# Patient Record
Sex: Female | Born: 1970 | Race: Black or African American | Hispanic: No | Smoking: Never smoker
Health system: Southern US, Community
[De-identification: ages and names within clinical notes are randomized; demographics above are authoritative.]

## PROBLEM LIST (undated history)

## (undated) DIAGNOSIS — K219 Gastro-esophageal reflux disease without esophagitis: Secondary | ICD-10-CM

## (undated) DIAGNOSIS — I10 Essential (primary) hypertension: Secondary | ICD-10-CM

## (undated) DIAGNOSIS — F319 Bipolar disorder, unspecified: Secondary | ICD-10-CM

## (undated) HISTORY — DX: Essential (primary) hypertension: I10

---

## 2001-10-21 ENCOUNTER — Emergency Department (HOSPITAL_COMMUNITY): Admission: EM | Admit: 2001-10-21 | Discharge: 2001-10-21 | Payer: Self-pay | Admitting: Emergency Medicine

## 2001-10-21 ENCOUNTER — Encounter: Payer: Self-pay | Admitting: Emergency Medicine

## 2002-10-07 ENCOUNTER — Emergency Department (HOSPITAL_COMMUNITY): Admission: EM | Admit: 2002-10-07 | Discharge: 2002-10-07 | Payer: Self-pay | Admitting: Emergency Medicine

## 2002-12-04 ENCOUNTER — Emergency Department (HOSPITAL_COMMUNITY): Admission: EM | Admit: 2002-12-04 | Discharge: 2002-12-04 | Payer: Self-pay | Admitting: Emergency Medicine

## 2003-03-24 ENCOUNTER — Encounter (INDEPENDENT_AMBULATORY_CARE_PROVIDER_SITE_OTHER): Payer: Self-pay | Admitting: Nurse Practitioner

## 2003-06-01 ENCOUNTER — Ambulatory Visit (HOSPITAL_COMMUNITY): Admission: RE | Admit: 2003-06-01 | Discharge: 2003-06-01 | Payer: Self-pay | Admitting: Internal Medicine

## 2004-12-24 ENCOUNTER — Emergency Department (HOSPITAL_COMMUNITY): Admission: EM | Admit: 2004-12-24 | Discharge: 2004-12-24 | Payer: Self-pay | Admitting: Emergency Medicine

## 2004-12-31 ENCOUNTER — Emergency Department (HOSPITAL_COMMUNITY): Admission: EM | Admit: 2004-12-31 | Discharge: 2004-12-31 | Payer: Self-pay | Admitting: Emergency Medicine

## 2005-01-08 ENCOUNTER — Emergency Department (HOSPITAL_COMMUNITY): Admission: EM | Admit: 2005-01-08 | Discharge: 2005-01-08 | Payer: Self-pay | Admitting: Emergency Medicine

## 2005-01-09 ENCOUNTER — Emergency Department (HOSPITAL_COMMUNITY): Admission: EM | Admit: 2005-01-09 | Discharge: 2005-01-09 | Payer: Self-pay | Admitting: Emergency Medicine

## 2005-02-03 ENCOUNTER — Emergency Department (HOSPITAL_COMMUNITY): Admission: EM | Admit: 2005-02-03 | Discharge: 2005-02-03 | Payer: Self-pay | Admitting: Emergency Medicine

## 2005-03-26 ENCOUNTER — Emergency Department (HOSPITAL_COMMUNITY): Admission: EM | Admit: 2005-03-26 | Discharge: 2005-03-26 | Payer: Self-pay | Admitting: Emergency Medicine

## 2005-08-20 ENCOUNTER — Emergency Department (HOSPITAL_COMMUNITY): Admission: EM | Admit: 2005-08-20 | Discharge: 2005-08-20 | Payer: Self-pay | Admitting: Emergency Medicine

## 2005-09-01 ENCOUNTER — Emergency Department (HOSPITAL_COMMUNITY): Admission: EM | Admit: 2005-09-01 | Discharge: 2005-09-01 | Payer: Self-pay | Admitting: Emergency Medicine

## 2005-10-24 ENCOUNTER — Emergency Department (HOSPITAL_COMMUNITY): Admission: EM | Admit: 2005-10-24 | Discharge: 2005-10-25 | Payer: Self-pay | Admitting: *Deleted

## 2006-03-05 ENCOUNTER — Emergency Department (HOSPITAL_COMMUNITY): Admission: EM | Admit: 2006-03-05 | Discharge: 2006-03-05 | Payer: Self-pay | Admitting: Emergency Medicine

## 2006-05-24 ENCOUNTER — Emergency Department (HOSPITAL_COMMUNITY): Admission: EM | Admit: 2006-05-24 | Discharge: 2006-05-25 | Payer: Self-pay | Admitting: Emergency Medicine

## 2006-10-12 ENCOUNTER — Emergency Department (HOSPITAL_COMMUNITY): Admission: EM | Admit: 2006-10-12 | Discharge: 2006-10-13 | Payer: Self-pay | Admitting: Emergency Medicine

## 2006-10-17 ENCOUNTER — Ambulatory Visit: Payer: Self-pay | Admitting: Nurse Practitioner

## 2006-10-17 ENCOUNTER — Ambulatory Visit: Payer: Self-pay | Admitting: *Deleted

## 2006-11-20 ENCOUNTER — Ambulatory Visit: Payer: Self-pay | Admitting: Internal Medicine

## 2007-02-12 ENCOUNTER — Ambulatory Visit: Payer: Self-pay | Admitting: Internal Medicine

## 2007-03-08 ENCOUNTER — Encounter (INDEPENDENT_AMBULATORY_CARE_PROVIDER_SITE_OTHER): Payer: Self-pay | Admitting: Nurse Practitioner

## 2007-03-08 DIAGNOSIS — K219 Gastro-esophageal reflux disease without esophagitis: Secondary | ICD-10-CM | POA: Insufficient documentation

## 2007-03-08 DIAGNOSIS — E669 Obesity, unspecified: Secondary | ICD-10-CM | POA: Insufficient documentation

## 2007-03-08 DIAGNOSIS — M26609 Unspecified temporomandibular joint disorder, unspecified side: Secondary | ICD-10-CM | POA: Insufficient documentation

## 2007-03-09 ENCOUNTER — Ambulatory Visit: Payer: Self-pay | Admitting: Family Medicine

## 2007-04-23 ENCOUNTER — Ambulatory Visit: Payer: Self-pay | Admitting: Internal Medicine

## 2007-08-30 ENCOUNTER — Ambulatory Visit: Payer: Self-pay | Admitting: Internal Medicine

## 2007-09-18 ENCOUNTER — Ambulatory Visit: Payer: Self-pay | Admitting: Internal Medicine

## 2008-05-19 ENCOUNTER — Ambulatory Visit: Payer: Self-pay | Admitting: Internal Medicine

## 2008-05-21 ENCOUNTER — Ambulatory Visit (HOSPITAL_COMMUNITY): Admission: RE | Admit: 2008-05-21 | Discharge: 2008-05-21 | Payer: Self-pay | Admitting: Internal Medicine

## 2008-08-20 ENCOUNTER — Encounter (INDEPENDENT_AMBULATORY_CARE_PROVIDER_SITE_OTHER): Payer: Self-pay | Admitting: Internal Medicine

## 2008-08-20 ENCOUNTER — Ambulatory Visit: Payer: Self-pay | Admitting: Internal Medicine

## 2008-08-20 LAB — CONVERTED CEMR LAB
ALT: 14 units/L (ref 0–35)
AST: 14 units/L (ref 0–37)
Basophils Relative: 1 % (ref 0–1)
Chloride: 105 meq/L (ref 96–112)
Creatinine, Ser: 0.86 mg/dL (ref 0.40–1.20)
Glucose, Bld: 96 mg/dL (ref 70–99)
HCT: 39.4 % (ref 36.0–46.0)
Lymphocytes Relative: 40 % (ref 12–46)
Lymphs Abs: 2.3 10*3/uL (ref 0.7–4.0)
MCHC: 32.2 g/dL (ref 30.0–36.0)
Monocytes Absolute: 0.3 10*3/uL (ref 0.1–1.0)
Neutrophils Relative %: 50 % (ref 43–77)
Potassium: 4.2 meq/L (ref 3.5–5.3)
RDW: 14.3 % (ref 11.5–15.5)
Total Bilirubin: 0.4 mg/dL (ref 0.3–1.2)
Total Protein: 6.9 g/dL (ref 6.0–8.3)

## 2008-09-12 ENCOUNTER — Emergency Department (HOSPITAL_COMMUNITY): Admission: EM | Admit: 2008-09-12 | Discharge: 2008-09-13 | Payer: Self-pay | Admitting: Emergency Medicine

## 2008-09-18 ENCOUNTER — Ambulatory Visit: Payer: Self-pay | Admitting: Internal Medicine

## 2008-12-05 ENCOUNTER — Emergency Department (HOSPITAL_COMMUNITY): Admission: EM | Admit: 2008-12-05 | Discharge: 2008-12-05 | Payer: Self-pay | Admitting: Emergency Medicine

## 2008-12-06 ENCOUNTER — Emergency Department (HOSPITAL_COMMUNITY): Admission: EM | Admit: 2008-12-06 | Discharge: 2008-12-06 | Payer: Self-pay | Admitting: Emergency Medicine

## 2008-12-08 ENCOUNTER — Ambulatory Visit: Payer: Self-pay | Admitting: Family Medicine

## 2008-12-10 ENCOUNTER — Encounter (INDEPENDENT_AMBULATORY_CARE_PROVIDER_SITE_OTHER): Payer: Self-pay | Admitting: Internal Medicine

## 2008-12-10 LAB — CONVERTED CEMR LAB: LDH: 146 units/L (ref 94–250)

## 2008-12-11 ENCOUNTER — Encounter (INDEPENDENT_AMBULATORY_CARE_PROVIDER_SITE_OTHER): Payer: Self-pay | Admitting: Internal Medicine

## 2008-12-11 ENCOUNTER — Ambulatory Visit: Payer: Self-pay | Admitting: Internal Medicine

## 2008-12-11 LAB — CONVERTED CEMR LAB: Mumps IgG: 1.7 — ABNORMAL HIGH

## 2008-12-15 ENCOUNTER — Ambulatory Visit (HOSPITAL_COMMUNITY): Admission: RE | Admit: 2008-12-15 | Discharge: 2008-12-15 | Payer: Self-pay | Admitting: Internal Medicine

## 2008-12-24 ENCOUNTER — Ambulatory Visit: Payer: Self-pay | Admitting: Internal Medicine

## 2009-01-30 ENCOUNTER — Emergency Department (HOSPITAL_COMMUNITY): Admission: EM | Admit: 2009-01-30 | Discharge: 2009-01-30 | Payer: Self-pay | Admitting: Emergency Medicine

## 2009-04-07 ENCOUNTER — Ambulatory Visit: Payer: Self-pay | Admitting: Internal Medicine

## 2009-04-07 ENCOUNTER — Encounter (INDEPENDENT_AMBULATORY_CARE_PROVIDER_SITE_OTHER): Payer: Self-pay | Admitting: Internal Medicine

## 2009-04-07 LAB — CONVERTED CEMR LAB
Basophils Relative: 1 % (ref 0–1)
Hemoglobin: 13.4 g/dL (ref 12.0–15.0)
Lymphocytes Relative: 45 % (ref 12–46)
Lymphs Abs: 2.5 10*3/uL (ref 0.7–4.0)
Monocytes Relative: 5 % (ref 3–12)
Neutrophils Relative %: 49 % (ref 43–77)
Platelets: 298 10*3/uL (ref 150–400)
RDW: 14.2 % (ref 11.5–15.5)
WBC: 5.6 10*3/uL (ref 4.0–10.5)

## 2009-04-30 ENCOUNTER — Ambulatory Visit: Payer: Self-pay | Admitting: Family Medicine

## 2009-05-26 ENCOUNTER — Ambulatory Visit: Payer: Self-pay | Admitting: Internal Medicine

## 2009-06-02 ENCOUNTER — Encounter (INDEPENDENT_AMBULATORY_CARE_PROVIDER_SITE_OTHER): Payer: Self-pay | Admitting: Adult Health

## 2009-06-02 ENCOUNTER — Ambulatory Visit: Payer: Self-pay | Admitting: Internal Medicine

## 2009-06-02 LAB — CONVERTED CEMR LAB
Alkaline Phosphatase: 69 units/L (ref 39–117)
CO2: 26 meq/L (ref 19–32)
Chloride: 106 meq/L (ref 96–112)
Glucose, Bld: 82 mg/dL (ref 70–99)
HDL: 38 mg/dL — ABNORMAL LOW (ref >39)
Hemoglobin: 12 g/dL (ref 12.0–15.0)
Lymphs Abs: 2.1 10*3/uL (ref 0.7–4.0)
Neutrophils Relative %: 43 % (ref 43–77)
Potassium: 4.2 meq/L (ref 3.5–5.3)
RBC: 4.02 M/uL (ref 3.87–5.11)
RDW: 13.7 % (ref 11.5–15.5)
Sodium: 140 meq/L (ref 135–145)
Total CHOL/HDL Ratio: 4.6
Total Protein: 7 g/dL (ref 6.0–8.3)
WBC: 4.3 10*3/uL (ref 4.0–10.5)

## 2009-07-02 ENCOUNTER — Ambulatory Visit: Payer: Self-pay | Admitting: Internal Medicine

## 2009-08-27 ENCOUNTER — Emergency Department (HOSPITAL_COMMUNITY): Admission: EM | Admit: 2009-08-27 | Discharge: 2009-08-28 | Payer: Self-pay | Admitting: Emergency Medicine

## 2010-03-05 ENCOUNTER — Emergency Department (HOSPITAL_COMMUNITY): Admission: EM | Admit: 2010-03-05 | Discharge: 2010-03-05 | Payer: Self-pay | Admitting: Emergency Medicine

## 2010-06-03 ENCOUNTER — Emergency Department (HOSPITAL_COMMUNITY)
Admission: EM | Admit: 2010-06-03 | Discharge: 2010-06-03 | Payer: Self-pay | Source: Home / Self Care | Admitting: Emergency Medicine

## 2010-06-09 ENCOUNTER — Emergency Department (HOSPITAL_COMMUNITY)
Admission: EM | Admit: 2010-06-09 | Discharge: 2010-06-09 | Disposition: A | Discharge status: Home / Self Care | Payer: Self-pay | Attending: Emergency Medicine | Admitting: Emergency Medicine

## 2010-06-09 DIAGNOSIS — K3189 Other diseases of stomach and duodenum: Secondary | ICD-10-CM | POA: Insufficient documentation

## 2010-06-09 DIAGNOSIS — R51 Headache: Secondary | ICD-10-CM | POA: Insufficient documentation

## 2010-06-09 DIAGNOSIS — R110 Nausea: Secondary | ICD-10-CM | POA: Insufficient documentation

## 2010-06-09 DIAGNOSIS — R197 Diarrhea, unspecified: Secondary | ICD-10-CM | POA: Insufficient documentation

## 2010-06-09 DIAGNOSIS — K219 Gastro-esophageal reflux disease without esophagitis: Secondary | ICD-10-CM | POA: Insufficient documentation

## 2010-06-09 DIAGNOSIS — R109 Unspecified abdominal pain: Secondary | ICD-10-CM | POA: Insufficient documentation

## 2010-06-09 DIAGNOSIS — R10819 Abdominal tenderness, unspecified site: Secondary | ICD-10-CM | POA: Insufficient documentation

## 2010-06-09 DIAGNOSIS — R42 Dizziness and giddiness: Secondary | ICD-10-CM | POA: Insufficient documentation

## 2010-06-09 DIAGNOSIS — R509 Fever, unspecified: Secondary | ICD-10-CM | POA: Insufficient documentation

## 2010-06-09 LAB — POCT I-STAT, CHEM 8
BUN: 9 mg/dL (ref 6–23)
Calcium, Ion: 1.24 mmol/L (ref 1.12–1.32)
Chloride: 105 mEq/L (ref 96–112)
Creatinine, Ser: 0.9 mg/dL (ref 0.4–1.2)
HCT: 40 % (ref 36.0–46.0)
Hemoglobin: 13.6 g/dL (ref 12.0–15.0)
Potassium: 3.7 mEq/L (ref 3.5–5.1)
TCO2: 27 mmol/L (ref 0–100)

## 2010-06-09 LAB — URINALYSIS, ROUTINE W REFLEX MICROSCOPIC
Leukocytes, UA: NEGATIVE
Nitrite: NEGATIVE
Specific Gravity, Urine: 1.027 (ref 1.005–1.030)

## 2010-06-09 LAB — URINE MICROSCOPIC-ADD ON

## 2010-06-09 LAB — BASIC METABOLIC PANEL
BUN: 7 mg/dL (ref 6–23)
Calcium: 9.3 mg/dL (ref 8.4–10.5)
Chloride: 106 mEq/L (ref 96–112)
Glucose, Bld: 116 mg/dL — ABNORMAL HIGH (ref 70–99)
Potassium: 3.7 mEq/L (ref 3.5–5.1)

## 2010-06-09 LAB — POCT PREGNANCY, URINE: Preg Test, Ur: NEGATIVE

## 2010-06-21 ENCOUNTER — Emergency Department (HOSPITAL_COMMUNITY)
Admission: EM | Admit: 2010-06-21 | Discharge: 2010-06-21 | Disposition: A | Discharge status: Home / Self Care | Payer: Self-pay | Attending: Emergency Medicine | Admitting: Emergency Medicine

## 2010-06-21 DIAGNOSIS — J069 Acute upper respiratory infection, unspecified: Secondary | ICD-10-CM | POA: Insufficient documentation

## 2010-06-21 DIAGNOSIS — J3489 Other specified disorders of nose and nasal sinuses: Secondary | ICD-10-CM | POA: Insufficient documentation

## 2010-06-21 DIAGNOSIS — K219 Gastro-esophageal reflux disease without esophagitis: Secondary | ICD-10-CM | POA: Insufficient documentation

## 2010-07-21 LAB — DIFFERENTIAL
Basophils Relative: 0 % (ref 0–1)
Eosinophils Absolute: 0.1 10*3/uL (ref 0.0–0.7)
Monocytes Relative: 5 % (ref 3–12)
Neutro Abs: 2.5 10*3/uL (ref 1.7–7.7)
Neutrophils Relative %: 51 % (ref 43–77)

## 2010-07-21 LAB — COMPREHENSIVE METABOLIC PANEL
ALT: 23 U/L (ref 0–35)
Alkaline Phosphatase: 72 U/L (ref 39–117)
BUN: 14 mg/dL (ref 6–23)
Chloride: 106 mEq/L (ref 96–112)
Creatinine, Ser: 0.74 mg/dL (ref 0.4–1.2)
GFR calc Af Amer: >60 mL/min (ref >60)
GFR calc non Af Amer: >60 mL/min (ref >60)
Potassium: 4.1 mEq/L (ref 3.5–5.1)
Total Bilirubin: 0.3 mg/dL (ref 0.3–1.2)
Total Protein: 7.2 g/dL (ref 6.0–8.3)

## 2010-07-21 LAB — STOOL CULTURE

## 2010-07-21 LAB — CBC
Hemoglobin: 11.8 g/dL — ABNORMAL LOW (ref 12.0–15.0)
MCH: 30.8 pg (ref 26.0–34.0)
RBC: 3.83 MIL/uL — ABNORMAL LOW (ref 3.87–5.11)

## 2010-07-21 LAB — URINALYSIS, ROUTINE W REFLEX MICROSCOPIC
Ketones, ur: NEGATIVE mg/dL
Protein, ur: NEGATIVE mg/dL
Urobilinogen, UA: 0.2 mg/dL (ref 0.0–1.0)

## 2010-07-21 LAB — GIARDIA/CRYPTOSPORIDIUM SCREEN(EIA): Cryptosporidium Screen (EIA): NEGATIVE

## 2010-07-21 LAB — CLOSTRIDIUM DIFFICILE EIA: C difficile Toxins A+B, EIA: NEGATIVE

## 2010-07-27 LAB — RAPID STREP SCREEN (MED CTR MEBANE ONLY): Streptococcus, Group A Screen (Direct): NEGATIVE

## 2010-07-27 LAB — STREP A DNA PROBE: Group A Strep Probe: NEGATIVE

## 2011-08-25 ENCOUNTER — Observation Stay (HOSPITAL_COMMUNITY)
Admission: EM | Admit: 2011-08-25 | Discharge: 2011-08-26 | Disposition: A | Discharge status: Home / Self Care | Payer: Self-pay | Attending: Emergency Medicine | Admitting: Emergency Medicine

## 2011-08-25 ENCOUNTER — Encounter (HOSPITAL_COMMUNITY): Payer: Self-pay | Admitting: *Deleted

## 2011-08-25 DIAGNOSIS — R609 Edema, unspecified: Secondary | ICD-10-CM

## 2011-08-25 DIAGNOSIS — E669 Obesity, unspecified: Secondary | ICD-10-CM

## 2011-08-25 DIAGNOSIS — M7989 Other specified soft tissue disorders: Principal | ICD-10-CM | POA: Insufficient documentation

## 2011-08-25 NOTE — ED Notes (Signed)
Patient with bilateral lower leg swelling and her right foot hurts.

## 2011-08-26 ENCOUNTER — Observation Stay (HOSPITAL_COMMUNITY): Payer: Self-pay

## 2011-08-26 ENCOUNTER — Other Ambulatory Visit (HOSPITAL_COMMUNITY): Payer: Self-pay

## 2011-08-26 DIAGNOSIS — M7989 Other specified soft tissue disorders: Secondary | ICD-10-CM

## 2011-08-26 LAB — POCT I-STAT, CHEM 8
Calcium, Ion: 1.23 mmol/L (ref 1.12–1.32)
Chloride: 106 mEq/L (ref 96–112)
Glucose, Bld: 101 mg/dL — ABNORMAL HIGH (ref 70–99)
HCT: 34 % — ABNORMAL LOW (ref 36.0–46.0)
Hemoglobin: 11.6 g/dL — ABNORMAL LOW (ref 12.0–15.0)

## 2011-08-26 LAB — URINALYSIS, ROUTINE W REFLEX MICROSCOPIC
Ketones, ur: NEGATIVE mg/dL
Leukocytes, UA: NEGATIVE
Nitrite: NEGATIVE
Protein, ur: NEGATIVE mg/dL

## 2011-08-26 LAB — CBC
MCH: 30 pg (ref 26.0–34.0)
MCV: 91.6 fL (ref 78.0–100.0)
Platelets: 232 10*3/uL (ref 150–400)
RDW: 14.7 % (ref 11.5–15.5)
WBC: 6.8 10*3/uL (ref 4.0–10.5)

## 2011-08-26 LAB — DIFFERENTIAL
Basophils Absolute: 0 10*3/uL (ref 0.0–0.1)
Eosinophils Absolute: 0.2 10*3/uL (ref 0.0–0.7)
Eosinophils Relative: 2 % (ref 0–5)

## 2011-08-26 MED ORDER — ENOXAPARIN SODIUM 150 MG/ML ~~LOC~~ SOLN
1.0000 mg/kg | Freq: Once | SUBCUTANEOUS | Status: AC
  Administered 2011-08-26: 130 mg via SUBCUTANEOUS
  Filled 2011-08-26: qty 1

## 2011-08-26 NOTE — ED Notes (Signed)
The pt was given lovenox 130mg  sq in the rt abd 2 " below the umbilicus.  Food given.  The pt remains alert pleasant.  Unable to move to the cdu because that area is booked.  Pt aware she is ok to stay here in the regular ed.

## 2011-08-26 NOTE — ED Notes (Signed)
The pt just started a new job and she stands all the time.  Both her legs have been swollen especially the rt lower leg.  There is redness and point tenderness in the rt lower leg and both legs are swollen tight

## 2011-08-26 NOTE — ED Notes (Signed)
Vascular tech in with pt at this time 

## 2011-08-26 NOTE — ED Provider Notes (Signed)
Sign out by Dr. Nicanor Alcon. Patient was awaiting bilateral LE venous duplex US which were negative for bilateral LE edema. No signs of cellulitis. Bilateral LE are neuro vasc intact. Patient to elevate and use compression stockings and follow up with PCP for ongoing evaluation and management but gave strict precautions for return to ER. Patient voices understanding and is agreeable to plan.   Christie Walker [place], [place] 08/26/11 [phone]

## 2011-08-26 NOTE — ED Provider Notes (Signed)
History     CSN: [id number]  Arrival date & time 08/25/11  2323   First MD Initiated Contact with Patient 08/26/11 [phone]      Chief Complaint  Patient presents with  . Leg Swelling    (Consider location/radiation/quality/duration/timing/severity/associated sxs/prior treatment) Patient is a 41 y.o. female presenting with leg pain. The history is provided by the patient. No language interpreter was used.  Leg Pain  The incident occurred more than 2 days ago. Incident location: swelling started with new job several weeks ago, pain in the RLE worse this week. There was no injury mechanism. The pain is present in the right leg. The quality of the pain is described as sharp. The pain is at a severity of 9/10. The pain is severe. The pain has been constant since onset. Pertinent negatives include no numbness, no inability to bear weight, no loss of motion, no muscle weakness, no loss of sensation and no tingling. She reports no foreign bodies present. The symptoms are aggravated by palpation. She has tried nothing for the symptoms. The treatment provided no relief.    History reviewed. No pertinent past medical history.  History reviewed. No pertinent past surgical history.  No family history on file.  History  Substance Use Topics  . Smoking status: Never Smoker   . Smokeless tobacco: Not on file  . Alcohol Use: No    OB History    Grav Para Term Preterm Abortions TAB SAB Ect Mult Living                  Review of Systems  Respiratory: Negative for cough and shortness of breath.   Cardiovascular: Positive for leg swelling. Negative for chest pain.  Neurological: Negative for tingling and numbness.  All other systems reviewed and are negative.    Allergies  Propoxacet-n  Home Medications   Current Outpatient Rx  Name Route Sig Dispense Refill  . CETIRIZINE HCL 10 MG PO TABS Oral Take 10 mg by mouth daily as needed.    Marland Kitchen FLUTICASONE PROPIONATE 50 MCG/ACT NA SUSP Nasal  Place 2 sprays into the nose daily.      BP 113/63  Pulse 72  Temp(Src) 98.9 F (37.2 C) (Oral)  Resp 18  Ht 5\' 5"  (1.651 m)  Wt 280 lb (127.007 kg)  BMI 46.59 kg/m2  SpO2 99%  Physical Exam  Constitutional: She is oriented to person, place, and time. She appears well-developed and well-nourished. No distress.  HENT:  Head: Normocephalic and atraumatic.  Mouth/Throat: Oropharynx is clear and moist.  Eyes: Conjunctivae are normal. Pupils are equal, round, and reactive to light.  Neck: Normal range of motion. Neck supple.  Cardiovascular: Normal rate and regular rhythm.   Pulmonary/Chest: Effort normal and breath sounds normal.  Abdominal: Soft. Bowel sounds are normal.  Musculoskeletal: Normal range of motion. She exhibits edema.       BLE edema pain over right mid tibia at site of previous scar  Neurological: She is alert and oriented to person, place, and time.  Skin: Skin is warm and dry.  Psychiatric: She has a normal mood and affect.    ED Course  Procedures (including critical care time)  Labs Reviewed  CBC - Abnormal; Notable for the following:    RBC 3.57 (*)    Hemoglobin 10.7 (*)    HCT 32.7 (*)    All other components within normal limits  URINALYSIS, ROUTINE W REFLEX MICROSCOPIC - Abnormal; Notable for the following:  APPearance CLOUDY (*)    All other components within normal limits  D-DIMER, QUANTITATIVE - Abnormal; Notable for the following:    D-Dimer, Quant 0.55 (*)    All other components within normal limits  POCT I-STAT, CHEM 8 - Abnormal; Notable for the following:    Glucose, Bld 101 (*)    Hemoglobin 11.6 (*)    HCT 34.0 (*)    All other components within normal limits  DIFFERENTIAL  POCT PREGNANCY, URINE  PROTIME-INR   No results found.   No diagnosis found.    MDM  DDimer slightly elevated will r/o for DVT if negative will need to follow up with PMD for peripheral edema        Lyncoln Ledgerwood K Madyn Ivins-Rasch, MD 08/26/11 1610

## 2011-08-26 NOTE — ED Notes (Signed)
Pt resting, waiting on vascular tech

## 2011-08-26 NOTE — Progress Notes (Signed)
*  PRELIMINARY RESULTS* Vascular Ultrasound Lower extremity venous duplex has been completed.  Preliminary findings: Bilaterally no evidence of DVT or baker's cyst.  Farrel Demark, RDMS 08/26/2011, 8:25 AM

## 2011-08-26 NOTE — ED Notes (Signed)
Visual acquity ordered by mistake.  Not for this pt

## 2011-08-26 NOTE — Discharge Instructions (Signed)
Establishment with a Primary Care provider is Very important for general health care concerns, minor illness and minor injury and for further evaluation and management of lower extremity edema. Alternate between tylenol and ibuprofen as needed for pain but elevation and compression stockings are primary treatment. Return to ER for emergent changing or worsening of symptoms.

## 2011-08-28 NOTE — ED Provider Notes (Signed)
Medical screening examination/treatment/procedure(s) were performed by non-physician practitioner and as supervising physician I was immediately available for consultation/collaboration.  Jasmine Awe, MD 08/28/11 2300

## 2011-09-06 ENCOUNTER — Emergency Department (INDEPENDENT_AMBULATORY_CARE_PROVIDER_SITE_OTHER)
Admission: EM | Admit: 2011-09-06 | Discharge: 2011-09-06 | Disposition: A | Discharge status: Home / Self Care | Payer: Managed Care, Other (non HMO) | Source: Home / Self Care | Attending: Family Medicine | Admitting: Family Medicine

## 2011-09-06 ENCOUNTER — Encounter (HOSPITAL_COMMUNITY): Payer: Self-pay | Admitting: *Deleted

## 2011-09-06 DIAGNOSIS — I8310 Varicose veins of unspecified lower extremity with inflammation: Secondary | ICD-10-CM

## 2011-09-06 DIAGNOSIS — I872 Venous insufficiency (chronic) (peripheral): Secondary | ICD-10-CM

## 2011-09-06 MED ORDER — TRIAMCINOLONE 0.1 % CREAM:EUCERIN CREAM 1:1: TOPICAL_CREAM | CUTANEOUS | Status: DC

## 2011-09-06 MED ORDER — IBUPROFEN 600 MG PO TABS: 600.0000 mg | ORAL_TABLET | Freq: Three times a day (TID) | ORAL | Status: AC | PRN

## 2011-09-06 MED ORDER — CAPSAICIN & MENTHOL 0.025 & 4 % EX KIT: 1.0000 "application " | PACK | Freq: Three times a day (TID) | CUTANEOUS | Status: DC | PRN

## 2011-09-06 NOTE — ED Notes (Signed)
Pt  Seen  Er  Several weeks  Ago  For  Leg  Edema    -  She  Had  Ultrasound  Of her  Leg  Which  Was  Negative    She  Reports  r leg  Now is  painfull and  Somewhat  Reg  Tinged   She  Does however  Report the  Swelling is  Somewhat better  She  Ambulated  To  Room    Is  Sitting  Upright on  Exam table  Speaking in  Complete  sentances     denys  Any  Chest pain or any  Sob

## 2011-09-06 NOTE — Discharge Instructions (Signed)
Although the swelling in your legs Christie Walker risk for infections in your skin. I do not see signs of infection today. Decreasing the swelling in your legs is what can make her symptoms improve. 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 You can also try leg wraps intermittently. Do not sleep with your legs wrapped. Leg elevation by elevating there lower end of your bed could help.  Applied/take the prescribed medications as instructed. Avoid scratching as it can increase your risk for skin breaks and infections.  You need to lose weight and exercise daily. Avoid prolong standing or sitting with outbreaks to exercise her legs. You need to establish care with a primary care provider who can monitor your symptoms and safely tried other forms of therapy to help you. Return to urgent or emergency department services if worsening redness, ulcers, swelling or drainage.   Compression Stockings Compression stockings are elastic stockings that "compress" your legs. This helps to increase blood flow, decrease swelling, and reduces the chance of getting blood clots in your lower legs. Compression stockings are used:  After surgery.   If you have a history of poor circulation.   If you are prone to blood clots.   If you have varicose veins.   If you sit or are bedridden for long periods of time.  WEARING COMPRESSION STOCKINGS  Your compression stockings should be worn as instructed by your caregiver.   Wearing the correct stocking size is important. Your caregiver can help measure and fit you to the correct size.   When wearing your stockings, do not allow the stockings to bunch up. This is especially important around your toes or behind your knees. Keep the stockings as smooth as possible.   Do not roll the stockings downward and leave them rolled down. This can form a restrictive band around your  legs and can decrease blood flow.   The stockings should be removed once a day for 1 hour or as instructed by your caregiver. When the stockings are taken off, inspect your legs and feet. Look for:   Open sores.   Red spots.   Puffy areas (swelling).   Anything that does not seem normal.  IMPORTANT INFORMATION ABOUT COMPRESSION STOCKINGS  The compression stockings should be clean, dry, and in good condition before you put them on.   Do not put lotion on your legs or feet. This makes it harder to put the stockings on.   Change your stockings immediately if they become wet or soiled.   Do not wear stockings that are ripped or torn.   You may hand-wash or put your stockings in the washing machine. Use cold or warm water with mild detergent. Do not bleach your stockings. They may be air-dried or dried in the dryer on low heat.   If you have pain or have a feeling of "pins and needles" in your feet or legs, you may be wearing stockings that are too tight. Call your caregiver right away.  SEEK IMMEDIATE MEDICAL CARE IF:   You have numbness or tingling in your lower legs that does not get better quickly after the stockings are removed.   Your toes or feet become cold and blue.   You develop open sores or have red spots on your legs that do not go away.  MAKE SURE YOU:   Understand these instructions.   Will watch your condition.  Will get help right away if you are not doing well or get worse.  Document Released: 02/20/2009 Document Revised: 04/14/2011 Document Reviewed: 02/20/2009 [facility] Patient Information 2012 [place], [place].

## 2011-09-07 NOTE — ED Notes (Signed)
Pharmacy called, asking for clarification of Rx cream

## 2011-09-08 NOTE — ED Provider Notes (Signed)
History     CSN: [id number]  Arrival date & time 09/06/11  1512   First MD Initiated Contact with Patient 09/06/11 1604      Chief Complaint  Patient presents with  . Leg Swelling    (Consider location/radiation/quality/duration/timing/severity/associated sxs/prior treatment) HPI Comments: 41 y/o morbidly obese non smoker female with h/o chronic lower leg swelling. Here concerned about persistent bilateral leg swelling. She was seen at [facility] ED on 08/25/11 and had negative low extremity dopplers. Reports her swelling has improved some but persistent, associated with burning, tingling sensation; she also noticed an "indentation" in the skin of right lower leg and darkened skin color that has not improved or changed since last she was in the emergency department.  No ulcers or drainage in her lower legs. 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 She has used compression stockings which has helped some with swelling when she wears them. But she "has not used them today so I could see the degree of swelling".  Denies fever, chills or malaise, good appetite, no PND, no shortness of breath or chest pain.  States she was praying and "the holly spirit revealed to her she was bitten by a spider in the right leg and she believes that if she gets treatment for spider bite her symptoms will resolve" .  Patient just recently got health insurance.   History reviewed. No pertinent past medical history.  History reviewed. No pertinent past surgical history.  History reviewed. No pertinent family history.  History  Substance Use Topics  . Smoking status: Never Smoker   . Smokeless tobacco: Not on file  . Alcohol Use: No    OB History    Grav Para Term Preterm Abortions TAB SAB Ect Mult Living                  Review of Systems  Constitutional: Negative for fever, chills, diaphoresis, activity change and fatigue.  Respiratory: Negative for cough, chest tightness  and shortness of breath.   Cardiovascular: Positive for leg swelling. Negative for chest pain and palpitations.  Skin:       As per HPI  Neurological: Negative for dizziness and headaches.    Allergies  Amoxicillin; Hydrocodone; and Propoxyphene-acetaminophen  Home Medications   Current Outpatient Rx  Name Route Sig Dispense Refill  . PANTOPRAZOLE SODIUM 40 MG PO TBEC Oral Take 40 mg by mouth daily.    Marland Kitchen CAPSAICIN & MENTHOL 0.025 & 4 % EX KIT Apply externally Apply 1 application topically 3 (three) times daily as needed. 56.6 g 0  . CETIRIZINE HCL 10 MG PO TABS Oral Take 10 mg by mouth daily as needed.    Marland Kitchen FLUTICASONE PROPIONATE 50 MCG/ACT NA SUSP Nasal Place 2 sprays into the nose daily.    . IBUPROFEN 600 MG PO TABS Oral Take 1 tablet (600 mg total) by mouth every 8 (eight) hours as needed for pain. 20 tablet 0  . TRIAMCINOLONE 0.1 % CREAM:EUCERIN CREAM 1:1  Compound triamcinolone ointment 1% with Eucerin cream 1:1. Apply bid prn for itchiness, burning  or redness. Dispense 1 jar 1 each 0    BP 105/61  Pulse 75  Temp(Src) 98.4 F (36.9 C) (Oral)  Resp 18  SpO2 99%  Physical Exam  Nursing note and vitals reviewed. Constitutional: She is oriented to person, place, and time. She appears well-developed and well-nourished. No distress.       Morbidly obese  Neck: No  JVD present. No thyromegaly present.  Cardiovascular: Normal rate, regular rhythm and normal heart sounds.   Pulmonary/Chest: Effort normal and breath sounds normal. No respiratory distress. She has no wheezes. She has no rales.  Musculoskeletal:       Lower extremities with symmetric impending edema skin pretibial skin stasis dermatitis changes . Otherwise neurovascularly intact.   Neurological: She is alert and oriented to person, place, and time.  Skin:       Bilateral symmetric pitting ankle and pretibial leg edema up to mid lower legs. There are more evident skin changes in right leg with skin discoloration  mild erythema, thickening of the skin, (hyperpigmentation), tenderness and beginning to present skin atrophy in area around old scar from prior bone trauma. No striking erythema, no skin ulcers or brakes, no cellulitis.  No distal cyanosis.     ED Course  Procedures (including critical care time)  Labs Reviewed - No data to display No results found.   1. Stasis dermatitis       MDM  Spent over 20 min counseling patient about her chronic condition and her risk factors including obesity, night job with decreased mobility, lack of primary care doctor who can monitor her symptoms and could safely try other adjuvant therapy like diuretic etc. Prescribed capsaicin, triamcinolone compounded with Eucerin and ibuprofen. Reccommended weight reduction, leg elevation, self wraps (hand out provided) and consistent use of compression stocks, take breaks at work for leg muscle pump exercises. Follow up with a primary care doctor now that she has medical insurance. Asked to return if worsening redness, pain or new symptoms despite following treatment.        Sharin Grave, MD 09/08/11 [phone]

## 2012-04-12 ENCOUNTER — Emergency Department (HOSPITAL_COMMUNITY)
Admission: EM | Admit: 2012-04-12 | Discharge: 2012-04-12 | Disposition: A | Discharge status: Home / Self Care | Payer: Self-pay | Attending: Emergency Medicine | Admitting: Emergency Medicine

## 2012-04-12 ENCOUNTER — Encounter (HOSPITAL_COMMUNITY): Payer: Self-pay | Admitting: *Deleted

## 2012-04-12 DIAGNOSIS — R509 Fever, unspecified: Secondary | ICD-10-CM | POA: Insufficient documentation

## 2012-04-12 DIAGNOSIS — J029 Acute pharyngitis, unspecified: Secondary | ICD-10-CM | POA: Insufficient documentation

## 2012-04-12 DIAGNOSIS — H9209 Otalgia, unspecified ear: Secondary | ICD-10-CM | POA: Insufficient documentation

## 2012-04-12 DIAGNOSIS — J069 Acute upper respiratory infection, unspecified: Secondary | ICD-10-CM | POA: Insufficient documentation

## 2012-04-12 DIAGNOSIS — Z79899 Other long term (current) drug therapy: Secondary | ICD-10-CM | POA: Insufficient documentation

## 2012-04-12 DIAGNOSIS — R51 Headache: Secondary | ICD-10-CM | POA: Insufficient documentation

## 2012-04-12 DIAGNOSIS — R05 Cough: Secondary | ICD-10-CM | POA: Insufficient documentation

## 2012-04-12 DIAGNOSIS — R059 Cough, unspecified: Secondary | ICD-10-CM | POA: Insufficient documentation

## 2012-04-12 DIAGNOSIS — J329 Chronic sinusitis, unspecified: Secondary | ICD-10-CM | POA: Insufficient documentation

## 2012-04-12 MED ORDER — FLUTICASONE PROPIONATE 50 MCG/ACT NA SUSP: 2.0000 | Freq: Every day | NASAL | Status: DC

## 2012-04-12 MED ORDER — SULFAMETHOXAZOLE-TRIMETHOPRIM 800-160 MG PO TABS: 1.0000 | ORAL_TABLET | Freq: Two times a day (BID) | ORAL | Status: DC

## 2012-04-12 NOTE — ED Provider Notes (Signed)
History     CSN: [id number]  Arrival date & time 04/12/12  2141   First MD Initiated Contact with Patient 04/12/12 2148      Chief Complaint  Patient presents with  . Nasal Congestion  . Cough    (Consider location/radiation/quality/duration/timing/severity/associated sxs/prior treatment) HPI Comments: 41 year old female presents to the emergency department complaining of sinus pressure and nasal congestion for the past 3-4 weeks worsening over the past 2-3 days. She's tried taking Claritin, 0 tach and multiple other over-the-counter medications without relief of symptoms. Over the past few days she has developed a fever. She has not taken her temperature, she just states "I can tell a burning up". Admits to occasional cough when she has a lot of postnasal drip in the back of her throat. Denies chest pain, shortness of breath, nausea or vomiting.  The history is provided by the patient.    History reviewed. No pertinent past medical history.  History reviewed. No pertinent past surgical history.  History reviewed. No pertinent family history.  History  Substance Use Topics  . Smoking status: Never Smoker   . Smokeless tobacco: Not on file  . Alcohol Use: No    OB History    Grav Para Term Preterm Abortions TAB SAB Ect Mult Living                  Review of Systems  Constitutional: Positive for fever. Negative for chills.  HENT: Positive for ear pain (fullness), congestion, sore throat, rhinorrhea and sinus pressure. Negative for trouble swallowing, neck pain and neck stiffness.   Eyes: Negative.   Respiratory: Positive for cough. Negative for shortness of breath.   Cardiovascular: Negative for chest pain.  Gastrointestinal: Negative for nausea and vomiting.  Genitourinary: Negative.   Musculoskeletal: Negative.   Skin: Negative for rash.  Neurological: Positive for headaches.  Psychiatric/Behavioral: Negative for confusion.    Allergies  Amoxicillin;  Hydrocodone; and Propoxyphene-acetaminophen  Home Medications   Current Outpatient Rx  Name  Route  Sig  Dispense  Refill  . FLUTICASONE PROPIONATE 50 MCG/ACT NA SUSP   Nasal   Place 2 sprays into the nose daily.   16 g   2   . PANTOPRAZOLE SODIUM 40 MG PO TBEC   Oral   Take 40 mg by mouth daily.         . SULFAMETHOXAZOLE-TRIMETHOPRIM 800-160 MG PO TABS   Oral   Take 1 tablet by mouth 2 (two) times daily. One po bid x 7 days   14 tablet   0     BP 101/69  Pulse 91  Temp 98.6 F (37 C) (Oral)  Resp 16  SpO2 98%  Physical Exam  Nursing note and vitals reviewed. Constitutional: She is oriented to person, place, and time. She appears well-developed. No distress.       Obese  HENT:  Head: Normocephalic and atraumatic.  Right Ear: Hearing, tympanic membrane, external ear and ear canal normal.  Left Ear: Hearing, tympanic membrane, external ear and ear canal normal.  Nose: Mucosal edema and rhinorrhea present. Right sinus exhibits maxillary sinus tenderness. Left sinus exhibits maxillary sinus tenderness.  Mouth/Throat: Mucous membranes are normal. Posterior oropharyngeal edema and posterior oropharyngeal erythema present. No tonsillar abscesses.       Post nasal drip present.  Eyes: Conjunctivae normal are normal.  Neck: Normal range of motion. Neck supple.  Cardiovascular: Normal rate, regular rhythm and normal heart sounds.   Pulmonary/Chest: Effort normal and breath sounds  normal. She has no wheezes. She has no rhonchi.  Musculoskeletal: Normal range of motion. She exhibits no edema.  Lymphadenopathy:    She has cervical adenopathy.       Right cervical: Superficial cervical adenopathy present.       Left cervical: Superficial cervical adenopathy present.  Neurological: She is alert and oriented to person, place, and time.  Skin: Skin is warm.       Slightly clammy  Psychiatric: She has a normal mood and affect. Her behavior is normal.    ED Course   Procedures (including critical care time)  Labs Reviewed - No data to display No results found.   1. Sinusitis   2. URI (upper respiratory infection)       MDM  41 year old female with sinusitis. She is very warm on examination. Purulent postnasal drip present. Nose very congested. Symptoms have been present for about a month. Prescribed Bactrim and Flonase. Advised saltwater gargles and nasal saline. Return precautions discussed.        Trevor Mace, PA-C 04/12/12 2209

## 2012-04-12 NOTE — ED Provider Notes (Signed)
Medical screening examination/treatment/procedure(s) were performed by non-physician practitioner and as supervising physician I was immediately available for consultation/collaboration.  Gerhard Munch, MD 04/12/12 [phone]

## 2012-04-12 NOTE — ED Notes (Signed)
Pt presents to ed with c/o nasal congestion, cough and sneezing for past month and a half. Per pt "it got really bad last few days". Pt sts hx of allergies all year round, pt also sts fever, but can't tell how high. Lungs sounds clear.

## 2012-06-11 ENCOUNTER — Encounter (HOSPITAL_COMMUNITY): Payer: Self-pay | Admitting: Emergency Medicine

## 2012-06-11 ENCOUNTER — Emergency Department (HOSPITAL_COMMUNITY)
Admission: EM | Admit: 2012-06-11 | Discharge: 2012-06-12 | Disposition: A | Discharge status: Home / Self Care | Payer: Self-pay | Attending: Emergency Medicine | Admitting: Emergency Medicine

## 2012-06-11 DIAGNOSIS — Z79899 Other long term (current) drug therapy: Secondary | ICD-10-CM | POA: Insufficient documentation

## 2012-06-11 DIAGNOSIS — J329 Chronic sinusitis, unspecified: Secondary | ICD-10-CM | POA: Insufficient documentation

## 2012-06-11 NOTE — ED Notes (Signed)
Pt states for the past week and a half she has had headache, congestion, stuffy nose, pt states her mucus has a yellow color to it where it had been clear in color  Pt states in the morning when she first gets up and blows her nose it is green in color  Pt states she has not been sleeping well due to the congestion and headache

## 2012-06-12 ENCOUNTER — Telehealth (HOSPITAL_COMMUNITY): Payer: Self-pay | Admitting: Emergency Medicine

## 2012-06-12 MED ORDER — AZITHROMYCIN 250 MG PO TABS
500.0000 mg | ORAL_TABLET | Freq: Once | ORAL | Status: AC
  Administered 2012-06-12: 500 mg via ORAL
  Filled 2012-06-12: qty 2

## 2012-06-12 MED ORDER — AZITHROMYCIN 250 MG PO TABS: 250.0000 mg | ORAL_TABLET | Freq: Every day | ORAL | Status: DC

## 2012-06-12 NOTE — ED Provider Notes (Signed)
History     CSN: [id number]  Arrival date & time 06/11/12  2152   First MD Initiated Contact with Patient 06/11/12 2337      Chief Complaint  Patient presents with  . Nasal Congestion    (Consider location/radiation/quality/duration/timing/severity/associated sxs/prior treatment) HPI  Pt to the ED with complaints of sinus infection. She says that these have been recurrent every couple of months and she is not sure why. She has tried netty pot,, normal saline, pseudoephedrine, and many other OTC medications without relief. Symptoms have been persisting for 2 weeks. No fevers. Symptoms are keeping her awake at night. nad vss  History reviewed. No pertinent past medical history.  History reviewed. No pertinent past surgical history.  Family History  Problem Relation Age of Onset  . Mental illness Other     History  Substance Use Topics  . Smoking status: Never Smoker   . Smokeless tobacco: Not on file  . Alcohol Use: No    OB History    Grav Para Term Preterm Abortions TAB SAB Ect Mult Living                  Review of Systems  Review of Systems  Gen: no weight loss, fevers, chills, night sweats  Eyes: no discharge or drainage, no occular pain or visual changes  Nose: no epistaxis + rhinorrhea  Mouth: no dental pain, no sore throat  Neck: no neck pain  Lungs:No wheezing, coughing or hemoptysis CV: no chest pain, palpitations, dependent edema or orthopnea  Abd: no abdominal pain, nausea, vomiting  GU: no dysuria or gross hematuria  MSK:  No abnormalities  Neuro: no headache, no focal neurologic deficits  Skin: no abnormalities Psyche: negative.    Allergies  Amoxicillin; Hydrocodone; and Propoxyphene-acetaminophen  Home Medications   Current Outpatient Rx  Name  Route  Sig  Dispense  Refill  . ARIPIPRAZOLE 10 MG PO TABS   Oral   Take 10 mg by mouth every evening.         Marland Kitchen OXYMETAZOLINE HCL 0.05 % NA SOLN   Nasal   Place 1 spray into the nose 2  (two) times daily as needed. Sinus allergies         . AZITHROMYCIN 250 MG PO TABS   Oral   Take 1 tablet (250 mg total) by mouth daily. Take first 2 tablets together, then 1 every day until finished.   4 tablet   0     1 tab q day starting on 06/13/2012     LMP 05/13/2012  Physical Exam  Nursing note and vitals reviewed. Constitutional: She appears well-developed and well-nourished. No distress.  HENT:  Head: Normocephalic and atraumatic. No trismus in the jaw.  Right Ear: Hearing, tympanic membrane, external ear and ear canal normal.  Left Ear: Hearing and external ear normal.  Nose: Mucosal edema, rhinorrhea and sinus tenderness present. No nose lacerations, nasal deformity, septal deviation or nasal septal hematoma. No epistaxis.  Mouth/Throat: No oral lesions. No uvula swelling. Oropharyngeal exudate present. No posterior oropharyngeal edema or tonsillar abscesses.  Eyes: Pupils are equal, round, and reactive to light.  Neck: Normal range of motion. Neck supple.  Cardiovascular: Normal rate and regular rhythm.   Pulmonary/Chest: Effort normal.  Abdominal: Soft.  Neurological: She is alert.  Skin: Skin is warm and dry.    ED Course  Procedures (including critical care time)  Labs Reviewed - No data to display No results found.   1. Sinusitis  MDM  Rx Azithromycin  Recurrent sinusitis pt given referral to ENT and advised to take Zyrtec q day.  Pt has been advised of the symptoms that warrant their return to the ED. Patient has voiced understanding and has agreed to follow-up with the PCP or specialist.         Dorthula Matas, PA 06/12/12 [phone]

## 2012-06-12 NOTE — ED Notes (Addendum)
Question regarding Rx for Zithromax whether prescriber wanted Z Pak or just 4 tablets.  Informed pharmacy pt got 2 tablets in ED so should just be 4 tabklets 1 daily starting 06/13/12.

## 2012-06-13 NOTE — ED Provider Notes (Signed)
Medical screening examination/treatment/procedure(s) were performed by non-physician practitioner and as supervising physician I was immediately available for consultation/collaboration.  Juliet Rude. Rubin Payor, MD 06/13/12 1453

## 2012-11-13 ENCOUNTER — Emergency Department (HOSPITAL_COMMUNITY)
Admission: EM | Admit: 2012-11-13 | Discharge: 2012-11-13 | Disposition: A | Discharge status: Home / Self Care | Payer: Self-pay | Attending: Emergency Medicine | Admitting: Emergency Medicine

## 2012-11-13 ENCOUNTER — Encounter (HOSPITAL_COMMUNITY): Payer: Self-pay | Admitting: *Deleted

## 2012-11-13 ENCOUNTER — Emergency Department (HOSPITAL_COMMUNITY): Payer: Self-pay

## 2012-11-13 DIAGNOSIS — M7731 Calcaneal spur, right foot: Secondary | ICD-10-CM

## 2012-11-13 DIAGNOSIS — Z880 Allergy status to penicillin: Secondary | ICD-10-CM | POA: Insufficient documentation

## 2012-11-13 DIAGNOSIS — M898X9 Other specified disorders of bone, unspecified site: Secondary | ICD-10-CM | POA: Insufficient documentation

## 2012-11-13 DIAGNOSIS — Z79899 Other long term (current) drug therapy: Secondary | ICD-10-CM | POA: Insufficient documentation

## 2012-11-13 DIAGNOSIS — F319 Bipolar disorder, unspecified: Secondary | ICD-10-CM | POA: Insufficient documentation

## 2012-11-13 HISTORY — DX: Bipolar disorder, unspecified: F31.9

## 2012-11-13 MED ORDER — TRAMADOL HCL 50 MG PO TABS
50.0000 mg | ORAL_TABLET | Freq: Once | ORAL | Status: AC
  Administered 2012-11-13: 50 mg via ORAL
  Filled 2012-11-13: qty 1

## 2012-11-13 MED ORDER — TRAMADOL HCL 50 MG PO TABS: 50.0000 mg | ORAL_TABLET | Freq: Four times a day (QID) | ORAL | Status: DC | PRN

## 2012-11-13 NOTE — ED Provider Notes (Signed)
History    CSN: [id number] Arrival date & time 11/13/12  0605  First MD Initiated Contact with Patient 11/13/12 [phone]     Chief Complaint  Patient presents with  . Foot Pain   (Consider location/radiation/quality/duration/timing/severity/associated sxs/prior Treatment) HPI Comments: Patient is a 42 year old female with a past medical history of bipolar 1 disorder who presents with right heel pain for the past 3 weeks. Symptoms started gradually and progressively worsened since the onset. Patient denies injury or acute changes. The pain is aching and severe without radiation. Weight bearing activity makes the pain worse. Nothing makes the pain better. Patient has not tried anything for pain. No associated symptoms.   Past Medical History  Diagnosis Date  . Bipolar 1 disorder    History reviewed. No pertinent past surgical history. Family History  Problem Relation Age of Onset  . Mental illness Other    History  Substance Use Topics  . Smoking status: Never Smoker   . Smokeless tobacco: Not on file  . Alcohol Use: No   OB History   Grav Para Term Preterm Abortions TAB SAB Ect Mult Living                 Review of Systems  Musculoskeletal: Positive for arthralgias.  All other systems reviewed and are negative.    Allergies  Amoxicillin; Hydrocodone; and Propoxyphene-acetaminophen  Home Medications   Current Outpatient Rx  Name  Route  Sig  Dispense  Refill  . ARIPiprazole (ABILIFY) 10 MG tablet   Oral   Take 10 mg by mouth every evening.         Marland Kitchen azithromycin (ZITHROMAX) 250 MG tablet   Oral   Take 1 tablet (250 mg total) by mouth daily. Take first 2 tablets together, then 1 every day until finished.   4 tablet   0     1 tab q day starting on 06/13/2012   . oxymetazoline (AFRIN) 0.05 % nasal spray   Nasal   Place 1 spray into the nose 2 (two) times daily as needed. Sinus allergies          BP 116/78  Pulse 90  Temp(Src) 98.2 F (36.8 C)  Resp 18   SpO2 99%  LMP 11/05/2012 Physical Exam  Nursing note and vitals reviewed. Constitutional: She is oriented to person, place, and time. She appears well-developed and well-nourished. No distress.  HENT:  Head: Normocephalic and atraumatic.  Eyes: Conjunctivae are normal.  Neck: Normal range of motion.  Cardiovascular: Normal rate and regular rhythm.  Exam reveals no gallop and no friction rub.   No murmur heard. Pulmonary/Chest: Effort normal and breath sounds normal. She has no wheezes. She has no rales. She exhibits no tenderness.  Abdominal: Soft. There is no tenderness.  Musculoskeletal: Normal range of motion.  Tenderness to palpation of plantar aspect of right foot near the heel. No obvious deformity or open wound.   Neurological: She is alert and oriented to person, place, and time. Coordination normal.  Speech is goal-oriented. Moves limbs without ataxia.   Skin: Skin is warm and dry.  Psychiatric: She has a normal mood and affect. Her behavior is normal.    ED Course  Procedures (including critical care time) Labs Reviewed - No data to display Dg Foot Complete Right  11/13/2012   *RADIOLOGY REPORT*  Clinical Data: Right foot pain around the healed.  RIGHT FOOT COMPLETE - 3+ VIEW  Comparison: Right lower leg 08/26/2011  Findings: Small  plantar and Achilles calcaneal spurs.  Right foot appears intact. No evidence of acute fracture or subluxation.  No focal bone lesions.  Bone matrix and cortex appear intact.  No abnormal radiopaque densities in the soft tissues.  IMPRESSION: No acute bony abnormalities demonstrated in the right foot.   Original Report Authenticated By: Burman Nieves, M.D.   1. Heel spur, right     MDM  6:20 AM Xray of right foot pending. Patient will have Tramadol for pain.   6:49 AM Xray shows plantar and achilles calcaneal bone spurs. Patient will have pain medications and instructions to follow up with Podiatry for further evaluation and management. No  injury or neurovascular compromise.   Emilia Beck, [place] 11/13/12 [phone]

## 2012-11-13 NOTE — ED Provider Notes (Signed)
Medical screening examination/treatment/procedure(s) were performed by non-physician practitioner and as supervising physician I was immediately available for consultation/collaboration.   Richardean Canal, MD 11/13/12 [phone]

## 2012-11-13 NOTE — ED Notes (Signed)
Patient transported to X-ray 

## 2012-11-13 NOTE — ED Notes (Signed)
PT is here with right heel pain that feels like there is a ball under her skin.  No injury.  Duration for a few weeks.  No diabetes and no redness or wound

## 2012-12-10 ENCOUNTER — Encounter (HOSPITAL_COMMUNITY): Payer: Self-pay | Admitting: Emergency Medicine

## 2012-12-10 ENCOUNTER — Emergency Department (HOSPITAL_COMMUNITY)
Admission: EM | Admit: 2012-12-10 | Discharge: 2012-12-10 | Disposition: A | Discharge status: Home / Self Care | Payer: Self-pay | Attending: Emergency Medicine | Admitting: Emergency Medicine

## 2012-12-10 DIAGNOSIS — Z880 Allergy status to penicillin: Secondary | ICD-10-CM | POA: Insufficient documentation

## 2012-12-10 DIAGNOSIS — F319 Bipolar disorder, unspecified: Secondary | ICD-10-CM | POA: Insufficient documentation

## 2012-12-10 DIAGNOSIS — Z79899 Other long term (current) drug therapy: Secondary | ICD-10-CM | POA: Insufficient documentation

## 2012-12-10 DIAGNOSIS — K089 Disorder of teeth and supporting structures, unspecified: Secondary | ICD-10-CM | POA: Insufficient documentation

## 2012-12-10 DIAGNOSIS — K029 Dental caries, unspecified: Secondary | ICD-10-CM

## 2012-12-10 MED ORDER — PENICILLIN V POTASSIUM 500 MG PO TABS: 500.0000 mg | ORAL_TABLET | Freq: Three times a day (TID) | ORAL | Status: DC

## 2012-12-10 MED ORDER — TRAMADOL HCL 50 MG PO TABS: 50.0000 mg | ORAL_TABLET | Freq: Four times a day (QID) | ORAL | Status: DC | PRN

## 2012-12-10 NOTE — ED Provider Notes (Signed)
Medical screening examination/treatment/procedure(s) were performed by non-physician practitioner and as supervising physician I was immediately available for consultation/collaboration.   Kimoni Pagliarulo E Manal Kreutzer, MD 12/10/12 2041 

## 2012-12-10 NOTE — ED Provider Notes (Signed)
  CSN: [id number]     Arrival date & time 12/10/12  1236 History     First MD Initiated Contact with Patient 12/10/12 1245     No chief complaint on file.  (Consider location/radiation/quality/duration/timing/severity/associated sxs/prior Treatment) HPI  42 year old female, history of bipolar, presents requesting for evaluations of dental pain. Patient states her left upper tooth has been hurting for the past 2 months from dental decay. Pain has been gradual onset and is getting progressively worse. Pain is described as a throbbing sensation, nonradiating, worsening with cold air. She has been taking Tylenol and ibuprofen with some relief. She denies fever, chills, throat swelling, productive cough, rash, recent trauma. She planned to followup with a dentist next month for dental extraction but currently cannot afford it.  Past Medical History  Diagnosis Date  . Bipolar 1 disorder    No past surgical history on file. Family History  Problem Relation Age of Onset  . Mental illness Other    History  Substance Use Topics  . Smoking status: Never Smoker   . Smokeless tobacco: Not on file  . Alcohol Use: No   OB History   Grav Para Term Preterm Abortions TAB SAB Ect Mult Living                 Review of Systems  HENT: Positive for dental problem. Negative for ear pain, sore throat and neck pain.   Skin: Negative for rash and wound.  All other systems reviewed and are negative.    Allergies  Amoxicillin; Hydrocodone; and Propoxyphene-acetaminophen  Home Medications   Current Outpatient Rx  Name  Route  Sig  Dispense  Refill  . ARIPiprazole (ABILIFY) 30 MG tablet   Oral   Take 30 mg by mouth daily.         . Oxcarbazepine (TRILEPTAL) 300 MG tablet   Oral   Take 150 mg by mouth daily.         . traMADol (ULTRAM) 50 MG tablet   Oral   Take 1 tablet (50 mg total) by mouth every 6 (six) hours as needed for pain.   15 tablet   0    BP 128/78  Pulse 84  Temp(Src)  99.5 F (37.5 C) (Oral)  Resp 16  SpO2 99%  LMP 11/05/2012 Physical Exam  Nursing note and vitals reviewed. Constitutional: She appears well-developed and well-nourished. No distress.  HENT:  Head: Atraumatic.  Mouth/Throat:    Eyes: Conjunctivae are normal.  Neck: Neck supple.  Skin: Skin is warm. No rash noted.    ED Course   Procedures (including critical care time)  Dental pain, no abscess.  Will prescribe abx and pain meds.  Dental referral given.  Labs Reviewed - No data to display No results found. 1. Pain due to dental caries     MDM  BP 128/78  Pulse 84  Temp(Src) 99.5 F (37.5 C) (Oral)  Resp 16  SpO2 99%  LMP 11/05/2012    Fayrene Helper, PA-C 12/10/12 1321

## 2012-12-10 NOTE — ED Notes (Signed)
L/upper dental pain x 2 weeks

## 2013-04-06 ENCOUNTER — Telehealth (HOSPITAL_COMMUNITY): Payer: Self-pay | Admitting: *Deleted

## 2013-04-06 ENCOUNTER — Emergency Department (HOSPITAL_COMMUNITY)
Admission: EM | Admit: 2013-04-06 | Discharge: 2013-04-06 | Disposition: A | Discharge status: Home / Self Care | Payer: Managed Care, Other (non HMO) | Attending: Emergency Medicine | Admitting: Emergency Medicine

## 2013-04-06 ENCOUNTER — Encounter (HOSPITAL_COMMUNITY): Payer: Self-pay | Admitting: Emergency Medicine

## 2013-04-06 DIAGNOSIS — Z880 Allergy status to penicillin: Secondary | ICD-10-CM | POA: Insufficient documentation

## 2013-04-06 DIAGNOSIS — F319 Bipolar disorder, unspecified: Secondary | ICD-10-CM | POA: Insufficient documentation

## 2013-04-06 DIAGNOSIS — Z8719 Personal history of other diseases of the digestive system: Secondary | ICD-10-CM | POA: Insufficient documentation

## 2013-04-06 DIAGNOSIS — Z79899 Other long term (current) drug therapy: Secondary | ICD-10-CM | POA: Insufficient documentation

## 2013-04-06 DIAGNOSIS — R42 Dizziness and giddiness: Secondary | ICD-10-CM | POA: Insufficient documentation

## 2013-04-06 HISTORY — DX: Gastro-esophageal reflux disease without esophagitis: K21.9

## 2013-04-06 MED ORDER — MECLIZINE HCL 50 MG PO TABS: 25.0000 mg | ORAL_TABLET | Freq: Three times a day (TID) | ORAL | Status: DC | PRN

## 2013-04-06 MED ORDER — MECLIZINE HCL 25 MG PO TABS
25.0000 mg | ORAL_TABLET | Freq: Once | ORAL | Status: AC
  Administered 2013-04-06: 25 mg via ORAL
  Filled 2013-04-06: qty 1

## 2013-04-06 MED ORDER — DIAZEPAM 5 MG PO TABS
5.0000 mg | ORAL_TABLET | Freq: Once | ORAL | Status: AC
  Administered 2013-04-06: 5 mg via ORAL
  Filled 2013-04-06: qty 1

## 2013-04-06 MED ORDER — ONDANSETRON 8 MG PO TBDP
8.0000 mg | ORAL_TABLET | Freq: Once | ORAL | Status: AC
  Administered 2013-04-06: 8 mg via ORAL
  Filled 2013-04-06: qty 1

## 2013-04-06 NOTE — ED Notes (Signed)
Pt states dizziness some better at this time. Pt has called ride to come get her.

## 2013-04-06 NOTE — ED Notes (Signed)
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 Describes as the room spinning. Pt states she became near syncopal while working and chose to come to ED for revaluation. Pt A & O. Denies pain. PWD.

## 2013-04-06 NOTE — ED Notes (Signed)
Pt requesting work note, sts medicine (Meclizine) is not working.  Pt encouraged to come back and get re-evaluated if not better, but I told her I would print a work note for her and leave it at the front desk of the ED at [facility]

## 2013-04-06 NOTE — ED Provider Notes (Signed)
CSN: [id number]     Arrival date & time 04/06/13  0153 History   First MD Initiated Contact with Patient 04/06/13 0227     Chief Complaint  Patient presents with  . Dizziness    HPI Patient reports developing nausea as well as a sense of dizziness over the past several hours.  She states when she turns her head rapidly she becomes "dizzy".  This is described as a sense that the room is spinning around and around.  She denies weakness of her arms or legs.  No head injury.  The use of anticoagulants.  No prior history of stroke.  She has no history of hypertension, diabetes or lipidemia.  No recent upper respiratory symptoms.  Symptoms are mild to moderate in severity.  Nothing improves her symptoms.   Past Medical History  Diagnosis Date  . Bipolar 1 disorder   . Acid reflux    History reviewed. No pertinent past surgical history. Family History  Problem Relation Age of Onset  . Mental illness Other    History  Substance Use Topics  . Smoking status: Never Smoker   . Smokeless tobacco: Not on file  . Alcohol Use: No   OB History   Grav Para Term Preterm Abortions TAB SAB Ect Mult Living                 Review of Systems  All other systems reviewed and are negative.    Allergies  Amoxicillin; Hydrocodone; and Propoxyphene-acetaminophen  Home Medications   Current Outpatient Rx  Name  Route  Sig  Dispense  Refill  . ARIPiprazole (ABILIFY) 30 MG tablet   Oral   Take 30 mg by mouth daily.         . Oxcarbazepine (TRILEPTAL) 300 MG tablet   Oral   Take 150 mg by mouth daily.         . meclizine (ANTIVERT) 50 MG tablet   Oral   Take 0.5 tablets (25 mg total) by mouth 3 (three) times daily as needed.   30 tablet   0    BP 99/66  Pulse 89  Temp(Src) 98.3 F (36.8 C) (Oral)  Resp 18  Wt 290 lb (131.543 kg)  SpO2 98%  LMP 03/10/2013 Physical Exam  Nursing note and vitals reviewed. Constitutional: She is oriented to person, place, and time. She appears  well-developed and well-nourished. No distress.  HENT:  Head: Normocephalic and atraumatic.  Eyes: EOM are normal. Pupils are equal, round, and reactive to light.  Neck: Normal range of motion.  Cardiovascular: Normal rate, regular rhythm and normal heart sounds.   Pulmonary/Chest: Effort normal and breath sounds normal.  Abdominal: Soft. She exhibits no distension. There is no tenderness.  Musculoskeletal: Normal range of motion.  Neurological: She is alert and oriented to person, place, and time.  5/5 strength in major muscle groups of  bilateral upper and lower extremities. Speech normal. No facial asymetry.   Skin: Skin is warm and dry.  Psychiatric: She has a normal mood and affect. Judgment normal.    ED Course  Procedures (including critical care time) Labs Review Labs Reviewed - No data to display Imaging Review No results found.  EKG Interpretation   None       MDM   1. Vertigo    Patient presents with vertiginous-like symptoms.  History of vertigo.  My suspicion for central vertigo is very low.  Patient feels much better after Valium and meclizine in emergency apartment.  Indication for imaging.  Discharge home in good condition.  PCP followup.  She understands to return to the ER for new or worsening symptoms    Lyanne Co, MD 04/06/13 [phone]

## 2013-04-06 NOTE — ED Notes (Signed)
Pt states she has had an upset stomach today without vomiting or diarrhea  Pt states she went to work and has been feeling dizzy  Pt states it has progressively gotten worse throughout the day

## 2013-04-14 ENCOUNTER — Emergency Department (HOSPITAL_COMMUNITY)
Admission: EM | Admit: 2013-04-14 | Discharge: 2013-04-14 | Disposition: A | Discharge status: Home / Self Care | Payer: Self-pay | Attending: Emergency Medicine | Admitting: Emergency Medicine

## 2013-04-14 ENCOUNTER — Encounter (HOSPITAL_COMMUNITY): Payer: Self-pay | Admitting: Emergency Medicine

## 2013-04-14 ENCOUNTER — Emergency Department (HOSPITAL_COMMUNITY): Payer: Self-pay

## 2013-04-14 DIAGNOSIS — Z8719 Personal history of other diseases of the digestive system: Secondary | ICD-10-CM | POA: Insufficient documentation

## 2013-04-14 DIAGNOSIS — Z79899 Other long term (current) drug therapy: Secondary | ICD-10-CM | POA: Insufficient documentation

## 2013-04-14 DIAGNOSIS — F319 Bipolar disorder, unspecified: Secondary | ICD-10-CM | POA: Insufficient documentation

## 2013-04-14 DIAGNOSIS — R42 Dizziness and giddiness: Secondary | ICD-10-CM | POA: Insufficient documentation

## 2013-04-14 DIAGNOSIS — R110 Nausea: Secondary | ICD-10-CM | POA: Insufficient documentation

## 2013-04-14 LAB — CBC
HCT: 34 % — ABNORMAL LOW (ref 36.0–46.0)
Hemoglobin: 11 g/dL — ABNORMAL LOW (ref 12.0–15.0)
MCH: 29.8 pg (ref 26.0–34.0)
MCHC: 32.4 g/dL (ref 30.0–36.0)

## 2013-04-14 LAB — BASIC METABOLIC PANEL
BUN: 15 mg/dL (ref 6–23)
Calcium: 8.9 mg/dL (ref 8.4–10.5)
GFR calc non Af Amer: 76 mL/min — ABNORMAL LOW (ref >90)
Glucose, Bld: 111 mg/dL — ABNORMAL HIGH (ref 70–99)

## 2013-04-14 MED ORDER — LORAZEPAM 1 MG PO TABS
1.0000 mg | ORAL_TABLET | Freq: Once | ORAL | Status: AC
  Administered 2013-04-14: 1 mg via ORAL
  Filled 2013-04-14: qty 1

## 2013-04-14 MED ORDER — SODIUM CHLORIDE 0.9 % IV BOLUS (SEPSIS): 1000.0000 mL | Freq: Once | INTRAVENOUS | Status: DC

## 2013-04-14 MED ORDER — ONDANSETRON HCL 4 MG PO TABS: 4.0000 mg | ORAL_TABLET | Freq: Four times a day (QID) | ORAL | Status: DC

## 2013-04-14 MED ORDER — ONDANSETRON HCL 4 MG/2ML IJ SOLN: 4.0000 mg | Freq: Once | INTRAMUSCULAR | Status: DC

## 2013-04-14 MED ORDER — MECLIZINE HCL 25 MG PO TABS
25.0000 mg | ORAL_TABLET | Freq: Once | ORAL | Status: AC
  Administered 2013-04-14: 25 mg via ORAL
  Filled 2013-04-14: qty 1

## 2013-04-14 MED ORDER — MECLIZINE HCL 25 MG PO TABS: 25.0000 mg | ORAL_TABLET | Freq: Four times a day (QID) | ORAL | Status: DC

## 2013-04-14 MED ORDER — ONDANSETRON 4 MG PO TBDP
4.0000 mg | ORAL_TABLET | Freq: Once | ORAL | Status: AC
  Administered 2013-04-14: 4 mg via ORAL
  Filled 2013-04-14: qty 1

## 2013-04-14 NOTE — ED Notes (Signed)
Patient ambulated well

## 2013-04-14 NOTE — ED Provider Notes (Signed)
CSN: [id number]     Arrival date & time 04/14/13  0203 History   First MD Initiated Contact with Patient 04/14/13 [phone]     Chief Complaint  Patient presents with  . Dizziness   (Consider location/radiation/quality/duration/timing/severity/associated sxs/prior Treatment) HPI History provided by patient. Has been having on and off vertigo for last few weeks. Was evaluated at [facility] long emergency department recently and given a prescription for meclizine. This does help some but it makes her very tired and she is unable to work taking this medication. She denies any difficulty with gait. She does get some nausea but no vomiting. Symptoms mild to moderate right now but at times are moderate to severe. Her symptoms are related to position. Currently if she turns her head she gets increasing dizziness described as room spinning. No headaches. No change in vision. No unilateral weakness or numbness. [facility].  Past Medical History  Diagnosis Date  . Bipolar 1 disorder   . Acid reflux    History reviewed. No pertinent past surgical history. Family History  Problem Relation Age of Onset  . Mental illness Other    History  Substance Use Topics  . Smoking status: Never Smoker   . Smokeless tobacco: Not on file  . Alcohol Use: No   OB History   Grav Para Term Preterm Abortions TAB SAB Ect Mult Living                 Review of Systems  Constitutional: Negative for fever and chills.  Eyes: Negative for pain.  Respiratory: Negative for shortness of breath.   Cardiovascular: Negative for chest pain.  Gastrointestinal: Negative for abdominal pain.  Genitourinary: Negative for dysuria.  Musculoskeletal: Negative for back pain, neck pain and neck stiffness.  Skin: Negative for rash.  Neurological: Positive for dizziness. Negative for headaches.  All other systems reviewed and are negative.    Allergies  Amoxicillin; Hydrocodone; and  Propoxyphene-acetaminophen  Home Medications   Current Outpatient Rx  Name  Route  Sig  Dispense  Refill  . ARIPiprazole (ABILIFY) 20 MG tablet   Oral   Take 20 mg by mouth daily.         . meclizine (ANTIVERT) 50 MG tablet   Oral   Take 0.5 tablets (25 mg total) by mouth 3 (three) times daily as needed.   30 tablet   0   . Oxcarbazepine (TRILEPTAL) 300 MG tablet   Oral   Take 150 mg by mouth daily.         . Triamcinolone Acetonide (NASACORT AQ NA)   Nasal   Place 1 Squirt into the nose daily as needed (for congestion-OTC).         Marland Kitchen meclizine (ANTIVERT) 25 MG tablet   Oral   Take 1 tablet (25 mg total) by mouth 4 (four) times daily.   28 tablet   0   . ondansetron (ZOFRAN) 4 MG tablet   Oral   Take 1 tablet (4 mg total) by mouth every 6 (six) hours.   12 tablet   0    BP 124/73  Pulse 73  Temp(Src) 97.9 F (36.6 C) (Oral)  Resp 18  Ht 5\' 5"  (1.651 m)  Wt 302 lb 4.8 oz (137.122 kg)  BMI 50.31 kg/m2  SpO2 94%  LMP 03/10/2013 Physical Exam  Constitutional: She is oriented to person, place, and time. She appears well-developed and well-nourished.  HENT:  Head: Normocephalic and  atraumatic.  Eyes: Conjunctivae and EOM are normal. Pupils are equal, round, and reactive to light.  Neck: Full passive range of motion without pain. Neck supple. No thyromegaly present.  No meningismus  Cardiovascular: Normal rate, regular rhythm, S1 normal, S2 normal and intact distal pulses.   Pulmonary/Chest: Effort normal and breath sounds normal.  Abdominal: Soft. Bowel sounds are normal. There is no tenderness. There is no CVA tenderness.  Musculoskeletal: Normal range of motion.  Neurological: She is alert and oriented to person, place, and time. She has normal strength and normal reflexes. No cranial nerve deficit or sensory deficit. She displays a negative Romberg sign. Coordination normal. GCS eye subscore is 4. GCS verbal subscore is 5. GCS motor subscore is 6.  Normal  Gait. Lateral nystagmus present  Skin: Skin is warm and dry. No rash noted. No cyanosis. Nails show no clubbing.  Psychiatric: She has a normal mood and affect. Her speech is normal and behavior is normal.    ED Course  Procedures (including critical care time) Labs Review Labs Reviewed  CBC - Abnormal; Notable for the following:    RBC 3.69 (*)    Hemoglobin 11.0 (*)    HCT 34.0 (*)    All other components within normal limits  BASIC METABOLIC PANEL - Abnormal; Notable for the following:    Glucose, Bld 111 (*)    GFR calc non Af Amer 76 (*)    GFR calc Af Amer 88 (*)    All other components within normal limits   Imaging Review Ct Head Wo Contrast  04/14/2013   CLINICAL DATA:  Dizziness.  EXAM: CT HEAD WITHOUT CONTRAST  TECHNIQUE: Contiguous axial images were obtained from the base of the skull through the vertex without intravenous contrast.  COMPARISON:  CT of the head performed 12/31/2004  FINDINGS: There is no evidence of acute infarction, mass lesion, or intra- or extra-axial hemorrhage on CT.  The posterior fossa, including the cerebellum, brainstem and fourth ventricle, is within normal limits. The third and lateral ventricles, and basal ganglia are unremarkable in appearance. The cerebral hemispheres are symmetric in appearance, with normal gray-white differentiation. No mass effect or midline shift is seen.  There is no evidence of fracture; visualized osseous structures are unremarkable in appearance. The orbits are within normal limits. The paranasal sinuses and mastoid air cells are well-aerated. No significant soft tissue abnormalities are seen.  IMPRESSION: Unremarkable noncontrast CT of the head.   Electronically Signed   By: Roanna Raider M.D.   On: 04/14/2013 05:43   Antivert, ativan, PO fluids  Recheck - symptoms improved, nystagmus resolved. No neuro deficits repeat exam.   Plan d/c home, f/u PCP, continue antivert as needed. Strict return precautions verbalized as  understood.   MDM   1. Vertigo    CT head for repeat visit, no intracranial abnormality.  Labs reviewed - normal electrolytes.  Medications provided and symptoms improved VS and nurses notes reviewed - BP WNL    Sunnie Nielsen, MD 04/14/13 2333

## 2013-04-14 NOTE — ED Notes (Signed)
The pt has had dizziness since this past weekend with nausea.  She was seen at [facility] long ed and was told it would go away but i has not.  She has a previous history of the  same

## 2014-02-08 ENCOUNTER — Emergency Department (HOSPITAL_COMMUNITY)
Admission: EM | Admit: 2014-02-08 | Discharge: 2014-02-08 | Disposition: A | Discharge status: Home / Self Care | Payer: Managed Care, Other (non HMO) | Attending: Emergency Medicine | Admitting: Emergency Medicine

## 2014-02-08 ENCOUNTER — Encounter (HOSPITAL_COMMUNITY): Payer: Self-pay | Admitting: Emergency Medicine

## 2014-02-08 DIAGNOSIS — M533 Sacrococcygeal disorders, not elsewhere classified: Secondary | ICD-10-CM | POA: Insufficient documentation

## 2014-02-08 DIAGNOSIS — Z79899 Other long term (current) drug therapy: Secondary | ICD-10-CM | POA: Insufficient documentation

## 2014-02-08 DIAGNOSIS — Z880 Allergy status to penicillin: Secondary | ICD-10-CM | POA: Insufficient documentation

## 2014-02-08 DIAGNOSIS — K219 Gastro-esophageal reflux disease without esophagitis: Secondary | ICD-10-CM | POA: Insufficient documentation

## 2014-02-08 DIAGNOSIS — F319 Bipolar disorder, unspecified: Secondary | ICD-10-CM | POA: Insufficient documentation

## 2014-02-08 MED ORDER — HYDROCODONE-ACETAMINOPHEN 5-325 MG PO TABS: ORAL_TABLET | ORAL | Status: DC

## 2014-02-08 MED ORDER — NAPROXEN 500 MG PO TABS: 500.0000 mg | ORAL_TABLET | Freq: Two times a day (BID) | ORAL | Status: DC

## 2014-02-08 NOTE — ED Notes (Signed)
Pt c/o tailbone pain x 2 wks.  States "her bone hurts when she sits".  Denies injury.

## 2014-02-08 NOTE — ED Provider Notes (Signed)
CSN: [id number]     Arrival date & time 02/08/14  1032 History   First MD Initiated Contact with Patient 02/08/14 1038     Chief Complaint  Patient presents with  . Tailbone Pain     (Consider location/radiation/quality/duration/timing/severity/associated sxs/prior Treatment) HPI Comments: Patient presents with complaint of sacral and tailbone pain for the past 2 weeks. Pain began acutely without any injury. Pain is worse when the patient stands up, squats, bends over. It is not as severe with standing straight up and walking. No red flag signs and symptoms of lower back pain. Patient has had sciatica in the past however states this is different. She has taken over-the-counter pain medications with some relief. No numbness, weakness, or tingling in her extremities. No change in her bowel movements or urination.  The history is provided by the patient.    Past Medical History  Diagnosis Date  . Bipolar 1 disorder   . Acid reflux    No past surgical history on file. Family History  Problem Relation Age of Onset  . Mental illness Other    History  Substance Use Topics  . Smoking status: Never Smoker   . Smokeless tobacco: Not on file  . Alcohol Use: No   OB History   Grav Para Term Preterm Abortions TAB SAB Ect Mult Living                 Review of Systems  Constitutional: Negative for fever and unexpected weight change.  Gastrointestinal: Negative for constipation.       Negative for fecal incontinence.   Genitourinary: Negative for dysuria, hematuria, flank pain, vaginal bleeding, vaginal discharge and pelvic pain.       Negative for urinary incontinence or retention.  Musculoskeletal: Positive for back pain.  Neurological: Negative for weakness and numbness.       Denies saddle paresthesias.      Allergies  Amoxicillin; Hydrocodone; and Propoxyphene n-acetaminophen  Home Medications   Prior to Admission medications   Medication Sig Start Date End Date Taking?  Authorizing Provider  ARIPiprazole (ABILIFY) 20 MG tablet Take 20 mg by mouth daily.    Historical Provider, MD  meclizine (ANTIVERT) 25 MG tablet Take 1 tablet (25 mg total) by mouth 4 (four) times daily. 04/14/13   Sunnie Nielsen, MD  meclizine (ANTIVERT) 50 MG tablet Take 0.5 tablets (25 mg total) by mouth 3 (three) times daily as needed. 04/06/13   Lyanne Co, MD  ondansetron (ZOFRAN) 4 MG tablet Take 1 tablet (4 mg total) by mouth every 6 (six) hours. 04/14/13   Sunnie Nielsen, MD  Oxcarbazepine (TRILEPTAL) 300 MG tablet Take 150 mg by mouth daily.    Historical Provider, MD  Triamcinolone Acetonide (NASACORT AQ NA) Place 1 Squirt into the nose daily as needed (for congestion-OTC).    Historical Provider, MD   BP 108/71  Pulse 86  Temp(Src) 98.9 F (37.2 C) (Oral)  Resp 18  SpO2 100% Physical Exam  Nursing note and vitals reviewed. Constitutional: She appears well-developed and well-nourished.  HENT:  Head: Normocephalic and atraumatic.  Eyes: Pupils are equal, round, and reactive to light.  Neck: Normal range of motion. Neck supple.  Cardiovascular: Exam reveals no decreased pulses.   Musculoskeletal: She exhibits tenderness. She exhibits no edema.  Exam difficult due to patient habitus. She is tender over sacral area, SI joint area, and coccygeal area. No skin findings of abscess or cellulitis.  Neurological: She is alert. No sensory deficit.  Motor, sensation, and vascular distal to the injury is fully intact.   Skin: Skin is warm and dry.  Psychiatric: She has a normal mood and affect.    ED Course  Procedures (including critical care time) Labs Review Labs Reviewed - No data to display  Imaging Review No results found.   EKG Interpretation None      11:13 AM Patient seen and examined.   Vital signs reviewed and are as follows: BP 108/71  Pulse 86  Temp(Src) 98.9 F (37.2 C) (Oral)  Resp 18  SpO2 100%  We will treat with conservative management including  NSAIDs, pain medication. Patient encouraged to get and use a donut pillow to help relieve pressure on this area while sitting.  Orthopedic referral given the patient in case symptoms do not improve with conservative management in 2 weeks.  No red flag s/s of low back pain. Patient was counseled on back pain precautions and told to do activity as tolerated but do not lift, push, or pull heavy objects more than 10 pounds for the next week.  Patient counseled to use ice or heat on back for no longer than 15 minutes every hour.   Patient prescribed narcotic pain medicine and counseled on proper use of narcotic pain medications. Counseled not to combine this medication with others containing tylenol.   Urged patient not to drink alcohol, drive, or perform any other activities that requires focus while taking either of these medications.  Patient urged to follow-up with PCP if pain does not improve with treatment and rest or if pain becomes recurrent. Urged to return with worsening severe pain, loss of bowel or bladder control, trouble walking.   The patient verbalizes understanding and agrees with the plan.   MDM   Final diagnoses:  Sacral back pain   Patient with lower back pain without injury. No red flag signs and symptoms of lower back pain. Will continue conservative management with orthopedic followup if not improved with these measures. No neurological deficits. Patient is ambulatory. No warning symptoms of back pain including: fecal incontinence, urinary retention or overflow incontinence, night sweats, waking from sleep with back pain, unexplained fevers or weight loss, h/o cancer, IVDU, recent trauma. No concern for cauda equina, epidural abscess, or other serious cause of back pain. Conservative measures such as rest, ice/heat and pain medicine indicated with PCP follow-up if no improvement with conservative management.      Renne CriglerJoshua Betzaida Cremeens, PA-C 02/08/14 1204

## 2014-02-08 NOTE — ED Provider Notes (Signed)
Medical screening examination/treatment/procedure(s) were performed by non-physician practitioner and as supervising physician I was immediately available for consultation/collaboration.    Suzi RootsKevin E Jeramey Lanuza, MD 02/08/14 [phone]

## 2014-02-08 NOTE — Discharge Instructions (Signed)
Please read and follow all provided instructions.  Your diagnoses today include:  1. Sacral back pain    Tests performed today include:  Vital signs - see below for your results today  Medications prescribed:   Vicodin (hydrocodone/acetaminophen) - narcotic pain medication  DO NOT drive or perform any activities that require you to be awake and alert because this medicine can make you drowsy. BE VERY CAREFUL not to take multiple medicines containing Tylenol (also called acetaminophen). Doing so can lead to an overdose which can damage your liver and cause liver failure and possibly death.   Naproxen - anti-inflammatory pain medication  Do not exceed 500mg  naproxen every 12 hours, take with food  You have been prescribed an anti-inflammatory medication or NSAID. Take with food. Take smallest effective dose for the shortest duration needed for your pain. Stop taking if you experience stomach pain or vomiting.   Take any prescribed medications only as directed.  Home care instructions:   Follow any educational materials contained in this packet  Please rest, use ice or heat on your back for the next several days  Do not lift, push, pull anything more than 10 pounds for the next week  Follow-up instructions: Please follow-up with the orthopedic bone doctor listed in 2 weeks for further evaluation of your symptoms if not improved.   Return instructions:  SEEK IMMEDIATE MEDICAL ATTENTION IF YOU HAVE:  New numbness, tingling, weakness, or problem with the use of your arms or legs  Severe back pain not relieved with medications  Loss control of your bowels or bladder  Increasing pain in any areas of the body (such as chest or abdominal pain)  Shortness of breath, dizziness, or fainting.   Worsening nausea (feeling sick to your stomach), vomiting, fever, or sweats  Any other emergent concerns regarding your health   Additional Information:  Your vital signs today  were: BP 108/71   Pulse 86   Temp(Src) 98.9 F (37.2 C) (Oral)   Resp 18   SpO2 100% If your blood pressure (BP) was elevated above 135/85 this visit, please have this repeated by your doctor within one month. --------------

## 2014-03-04 ENCOUNTER — Encounter (HOSPITAL_COMMUNITY): Payer: Self-pay | Admitting: Emergency Medicine

## 2014-03-04 ENCOUNTER — Emergency Department (HOSPITAL_COMMUNITY)
Admission: EM | Admit: 2014-03-04 | Discharge: 2014-03-05 | Disposition: A | Discharge status: Home / Self Care | Payer: Managed Care, Other (non HMO) | Attending: Emergency Medicine | Admitting: Emergency Medicine

## 2014-03-04 DIAGNOSIS — F319 Bipolar disorder, unspecified: Secondary | ICD-10-CM | POA: Insufficient documentation

## 2014-03-04 DIAGNOSIS — M545 Low back pain, unspecified: Secondary | ICD-10-CM

## 2014-03-04 DIAGNOSIS — Z885 Allergy status to narcotic agent status: Secondary | ICD-10-CM | POA: Insufficient documentation

## 2014-03-04 DIAGNOSIS — Z79899 Other long term (current) drug therapy: Secondary | ICD-10-CM | POA: Insufficient documentation

## 2014-03-04 DIAGNOSIS — K219 Gastro-esophageal reflux disease without esophagitis: Secondary | ICD-10-CM | POA: Insufficient documentation

## 2014-03-04 DIAGNOSIS — G8929 Other chronic pain: Secondary | ICD-10-CM

## 2014-03-04 DIAGNOSIS — Z881 Allergy status to other antibiotic agents status: Secondary | ICD-10-CM | POA: Insufficient documentation

## 2014-03-04 NOTE — ED Notes (Signed)
Pt c/o lower back pain. [facility] was [facility] for [facility]. Pt was instructed to come back to ED if pain did not improve. Pt ambulatory with steady gait.

## 2014-03-05 MED ORDER — NAPROXEN SODIUM 275 MG PO TABS: 275.0000 mg | ORAL_TABLET | Freq: Two times a day (BID) | ORAL | Status: DC

## 2014-03-05 MED ORDER — METHOCARBAMOL 500 MG PO TABS: 500.0000 mg | ORAL_TABLET | Freq: Two times a day (BID) | ORAL | Status: DC

## 2014-03-05 NOTE — ED Notes (Signed)
NP at [facility].

## 2014-03-05 NOTE — ED Provider Notes (Signed)
CSN: [id number]     Arrival date & time 03/04/14  2331 History   First MD Initiated Contact with Patient 03/05/14 0037     Chief Complaint  Patient presents with  . Back Pain     (Consider location/radiation/quality/duration/timing/severity/associated sxs/prior Treatment) Patient is a 43 y.o. female presenting with back pain. The history is provided by the patient.  Back Pain Location:  Lumbar spine Quality:  Aching Pain severity:  Moderate Pain is:  Worse during the day Onset quality:  Gradual Duration:  2 days Associated symptoms: no bladder incontinence, no bowel incontinence, no numbness and no perianal numbness     Past Medical History  Diagnosis Date  . Bipolar 1 disorder   . Acid reflux    History reviewed. No pertinent past surgical history. Family History  Problem Relation Age of Onset  . Mental illness Other    History  Substance Use Topics  . Smoking status: Never Smoker   . Smokeless tobacco: Not on file  . Alcohol Use: No   OB History   Grav Para Term Preterm Abortions TAB SAB Ect Mult Living                 Review of Systems  Gastrointestinal: Negative for bowel incontinence.  Genitourinary: Negative for bladder incontinence.  Musculoskeletal: Positive for back pain.  Neurological: Negative for numbness.  All other systems reviewed and are negative.     Allergies  Amoxicillin; Hydrocodone; and Propoxyphene n-acetaminophen  Home Medications   Prior to Admission medications   Medication Sig Start Date End Date Taking? Authorizing Provider  ARIPiprazole (ABILIFY) 20 MG tablet Take 20 mg by mouth daily.    Historical Provider, MD  HYDROcodone-acetaminophen (NORCO/VICODIN) 5-325 MG per tablet Take 1-2 tablets every 6 hours as needed for severe pain 02/08/14   Renne CriglerJoshua Geiple, PA-C  meclizine (ANTIVERT) 25 MG tablet Take 1 tablet (25 mg total) by mouth 4 (four) times daily. 04/14/13   Sunnie NielsenBrian Opitz, MD  meclizine (ANTIVERT) 50 MG tablet Take 0.5  tablets (25 mg total) by mouth 3 (three) times daily as needed. 04/06/13   Lyanne CoKevin M Campos, MD  naproxen (NAPROSYN) 500 MG tablet Take 1 tablet (500 mg total) by mouth 2 (two) times daily. 02/08/14   Renne CriglerJoshua Geiple, PA-C  ondansetron (ZOFRAN) 4 MG tablet Take 1 tablet (4 mg total) by mouth every 6 (six) hours. 04/14/13   Sunnie NielsenBrian Opitz, MD  Oxcarbazepine (TRILEPTAL) 300 MG tablet Take 150 mg by mouth daily.    Historical Provider, MD  Triamcinolone Acetonide (NASACORT AQ NA) Place 1 Squirt into the nose daily as needed (for congestion-OTC).    Historical Provider, MD   BP 145/80  Pulse 77  Temp(Src) 98.1 F (36.7 C) (Oral)  Resp 22  Ht 5\' 5"  (1.651 m)  Wt 310 lb 4.8 oz (140.751 kg)  BMI 51.64 kg/m2  SpO2 98% Physical Exam  Nursing note and vitals reviewed. Constitutional: She is oriented to person, place, and time. She appears well-developed and well-nourished.  HENT:  Head: Normocephalic.  Eyes: Pupils are equal, round, and reactive to light.  Neck: Normal range of motion.  Cardiovascular: Normal rate and regular rhythm.   Pulmonary/Chest: Effort normal and breath sounds normal.  Abdominal: Soft. Bowel sounds are normal.  Musculoskeletal: Normal range of motion. She exhibits tenderness. She exhibits no edema.       Back:  Neurological: She is alert and oriented to person, place, and time.  Skin: Skin is warm and dry.  Psychiatric: She has a normal mood and affect.    ED Course  Procedures (including critical care time) Labs Review Labs Reviewed - No data to display  Imaging Review No results found.   EKG Interpretation None     Patient seen several weeks ago for low back and sacral pain.  Had moderate improvement with treatment plan, but exacerbated her pain at work yesterday.  No red flag symptoms. MDM   Final diagnoses:  None    Exacerbation of low back pain.  Anti-inflammatory, muscle relaxant. Ortho/PCP follow-up if no improvement.    Jimmye Normanavid John Quinntin Malter,  NP 03/05/14 [phone]

## 2014-03-05 NOTE — Discharge Instructions (Signed)
Back Pain, Adult °Back pain is very common. The pain often gets better over time. The cause of back pain is usually not dangerous. Most people can learn to manage their back pain on their own.  °HOME CARE  °· Stay active. Start with short walks on flat ground if you can. Try to walk farther each day. °· Do not sit, drive, or stand in one place for more than 30 minutes. Do not stay in bed. °· Do not avoid exercise or work. Activity can help your back heal faster. °· Be careful when you bend or lift an object. Bend at your knees, keep the object close to you, and do not twist. °· Sleep on a firm mattress. Lie on your side, and bend your knees. If you lie on your back, put a pillow under your knees. °· Only take medicines as told by your doctor. °· Put ice on the injured area. °¨ Put ice in a plastic bag. °¨ Place a towel between your skin and the bag. °¨ Leave the ice on for 15-20 minutes, 03-04 times a day for the first 2 to 3 days. After that, you can switch between ice and heat packs. °· Ask your doctor about back exercises or massage. °· Avoid feeling anxious or stressed. Find good ways to deal with stress, such as exercise. °GET HELP RIGHT AWAY IF:  °· Your pain does not go away with rest or medicine. °· Your pain does not go away in 1 week. °· You have new problems. °· You do not feel well. °· The pain spreads into your legs. °· You cannot control when you poop (bowel movement) or pee (urinate). °· Your arms or legs feel weak or lose feeling (numbness). °· You feel sick to your stomach (nauseous) or throw up (vomit). °· You have belly (abdominal) pain. °· You feel like you may pass out (faint). °MAKE SURE YOU:  °· Understand these instructions. °· Will watch your condition. °· Will get help right away if you are not doing well or get worse. °Document Released: 10/12/2007 Document Revised: 07/18/2011 Document Reviewed: 08/27/2013 °Exit[employer]® Patient Information ©2015 [employer]. This information is not intended  to replace advice given to you by your health care provider. Make sure you discuss any questions you have with your health care provider. ° °

## 2014-03-05 NOTE — ED Notes (Signed)
Pt requesting work note

## 2014-03-07 NOTE — ED Provider Notes (Signed)
Medical screening examination/treatment/procedure(s) were performed by non-physician practitioner and as supervising physician I was immediately available for consultation/collaboration.   EKG Interpretation None       [name] [name], MD 03/07/14 1900 

## 2014-05-07 ENCOUNTER — Encounter (HOSPITAL_COMMUNITY): Payer: Self-pay | Admitting: *Deleted

## 2014-05-07 ENCOUNTER — Emergency Department (HOSPITAL_COMMUNITY)
Admission: EM | Admit: 2014-05-07 | Discharge: 2014-05-07 | Disposition: A | Discharge status: Home / Self Care | Payer: Managed Care, Other (non HMO) | Attending: Emergency Medicine | Admitting: Emergency Medicine

## 2014-05-07 DIAGNOSIS — Z79899 Other long term (current) drug therapy: Secondary | ICD-10-CM | POA: Insufficient documentation

## 2014-05-07 DIAGNOSIS — Z319 Encounter for procreative management, unspecified: Secondary | ICD-10-CM | POA: Insufficient documentation

## 2014-05-07 DIAGNOSIS — Z880 Allergy status to penicillin: Secondary | ICD-10-CM | POA: Insufficient documentation

## 2014-05-07 DIAGNOSIS — K219 Gastro-esophageal reflux disease without esophagitis: Secondary | ICD-10-CM | POA: Insufficient documentation

## 2014-05-07 DIAGNOSIS — Z791 Long term (current) use of non-steroidal anti-inflammatories (NSAID): Secondary | ICD-10-CM | POA: Insufficient documentation

## 2014-05-07 DIAGNOSIS — L739 Follicular disorder, unspecified: Secondary | ICD-10-CM | POA: Insufficient documentation

## 2014-05-07 MED ORDER — BACITRACIN ZINC 500 UNIT/GM EX OINT: 1.0000 "application " | TOPICAL_OINTMENT | Freq: Two times a day (BID) | CUTANEOUS | Status: DC

## 2014-05-07 MED ORDER — NAPROXEN 500 MG PO TABS: 500.0000 mg | ORAL_TABLET | Freq: Two times a day (BID) | ORAL | Status: DC

## 2014-05-07 MED ORDER — CLINDAMYCIN PHOSPHATE 1 % EX GEL: Freq: Two times a day (BID) | CUTANEOUS | Status: DC

## 2014-05-07 NOTE — Discharge Instructions (Signed)
Please follow directions provided. Use resource guide to establish care with a primary care doctor to ensure you're getting better. Use the clindamycin gel along the hairline where there are small raised bumps. He may use the naproxen twice a day for pain. Don't hesitate to return for any new, worsening, or concerning symptoms.   SEEK IMMEDIATE MEDICAL CARE IF:  Fluid starts leaking from the area of the enlarged lymph node.  You develop a fever of 102 F (38.9 C) or greater.  Severe pain develops (not necessarily at the site of a large lymph node).  You develop chest pain or shortness of breath.

## 2014-05-07 NOTE — ED Provider Notes (Signed)
CSN: [id number]     Arrival date & time 05/07/14  0001 History   First MD Initiated Contact with Patient 05/07/14 0445     Chief Complaint  Patient presents with  . Lymphadenopathy   (Consider location/radiation/quality/duration/timing/severity/associated sxs/prior Treatment) HPI Christie Isaacsicole Johnson Walker is a 43 year old female presenting with swelling and pain behind her right ear. She reports recently getting her hair braided tightly and noticing the pain and swelling a few days later. He reports taking a Tylenol at work with little relief from the pain. She describes the pain as an aching and rates it a 7-8 out of 10. She denies any fevers, chills, nausea, vomiting or drainage from the swelling.  Past Medical History  Diagnosis Date  . Bipolar 1 disorder   . Acid reflux    History reviewed. No pertinent past surgical history. Family History  Problem Relation Age of Onset  . Mental illness Other    History  Substance Use Topics  . Smoking status: Never Smoker   . Smokeless tobacco: Not on file  . Alcohol Use: No   OB History    No data available     Review of Systems  Constitutional: Negative for fever and chills.  HENT: Negative for sore throat.   Eyes: Negative for visual disturbance.  Respiratory: Negative for cough and shortness of breath.   Cardiovascular: Negative for chest pain and leg swelling.  Gastrointestinal: Negative for nausea, vomiting and diarrhea.  Genitourinary: Negative for dysuria.  Musculoskeletal: Negative for myalgias.  Skin: Negative for rash.  Neurological: Negative for weakness, numbness and headaches.      Allergies  Amoxicillin; Hydrocodone; and Propoxyphene n-acetaminophen  Home Medications   Prior to Admission medications   Medication Sig Start Date End Date Taking? Authorizing Provider  ARIPiprazole (ABILIFY) 30 MG tablet Take 30 mg by mouth daily.   Yes Historical Provider, MD  Triamcinolone Acetonide (NASACORT AQ NA) Place 1  Squirt into the nose daily as needed (for congestion-OTC).   Yes Historical Provider, MD  HYDROcodone-acetaminophen (NORCO/VICODIN) 5-325 MG per tablet Take 1-2 tablets every 6 hours as needed for severe pain Patient not taking: Reported on 05/07/2014 02/08/14   Renne CriglerJoshua Geiple, PA-C  meclizine (ANTIVERT) 25 MG tablet Take 1 tablet (25 mg total) by mouth 4 (four) times daily. Patient not taking: Reported on 05/07/2014 04/14/13   Sunnie NielsenBrian Opitz, MD  meclizine (ANTIVERT) 50 MG tablet Take 0.5 tablets (25 mg total) by mouth 3 (three) times daily as needed. Patient not taking: Reported on 05/07/2014 04/06/13   Lyanne CoKevin M Campos, MD  methocarbamol (ROBAXIN) 500 MG tablet Take 1 tablet (500 mg total) by mouth 2 (two) times daily. Patient not taking: Reported on 05/07/2014 03/05/14   Jimmye Normanavid John Smith, NP  naproxen (NAPROSYN) 500 MG tablet Take 1 tablet (500 mg total) by mouth 2 (two) times daily. Patient not taking: Reported on 05/07/2014 02/08/14   Renne CriglerJoshua Geiple, PA-C  naproxen sodium (ANAPROX) 275 MG tablet Take 1 tablet (275 mg total) by mouth 2 (two) times daily with a meal. Patient not taking: Reported on 05/07/2014 03/05/14   Jimmye Normanavid John Smith, NP  ondansetron (ZOFRAN) 4 MG tablet Take 1 tablet (4 mg total) by mouth every 6 (six) hours. Patient not taking: Reported on 05/07/2014 04/14/13   Sunnie NielsenBrian Opitz, MD   BP 112/68 mmHg  Pulse 83  Temp(Src) 97.9 F (36.6 C) (Oral)  Resp 18  SpO2 97%  LMP 04/23/2014 Physical Exam  Constitutional: She appears well-developed and well-nourished. No distress.  HENT:  Head: Normocephalic and atraumatic.  Mouth/Throat: Oropharynx is clear and moist. No oropharyngeal exudate.  Eyes: Conjunctivae are normal.  Neck: Neck supple. No thyromegaly present.  Cardiovascular: Normal rate, regular rhythm and intact distal pulses.   Pulmonary/Chest: Effort normal and breath sounds normal. No respiratory distress.  Abdominal: Soft. There is no tenderness.  Musculoskeletal: She  exhibits no tenderness.  Lymphadenopathy:       Head (right side): Posterior auricular and occipital adenopathy present.    She has no cervical adenopathy.  Neurological: She is alert.  Skin: Skin is warm and dry. No rash noted. She is not diaphoretic.  Folliculitis along occipital scalp at hairline.  Psychiatric: She has a normal mood and affect.  Nursing note and vitals reviewed.   ED Course  Procedures (including critical care time) Labs Review Labs Reviewed - No data to display  Imaging Review No results found.   EKG Interpretation None      MDM   Final diagnoses:  Folliculitis   43 yo with postauricular lymphadenopathy and folliculitis after after having hair tightly braided.  She has not had any fevers or systemic symptoms.  Discussed with pt the use of clindamycin gel along hairline and resolution of lymphadenopathy after folliculitis improved.  Pt is well-appearing, in no acute distress and vital signs are stable.  They appear safe to be discharged.  Discharge include follow-up with their PCP.  Return precautions provided.   Filed Vitals:   05/07/14 0035 05/07/14 0541  BP: 112/68 117/76  Pulse: 83 78  Temp: 97.9 F (36.6 C)   TempSrc: Oral   Resp: 18 18  SpO2: 97% 96%   Meds given in ED:  Medications - No data to display  Discharge Medication List as of 05/07/2014  5:35 AM    START taking these medications   Details  clindamycin (CLINDAGEL) 1 % gel Apply topically 2 (two) times daily., Starting 05/07/2014, Until Discontinued, Print    !! naproxen (NAPROSYN) 500 MG tablet Take 1 tablet (500 mg total) by mouth 2 (two) times daily., Starting 05/07/2014, Until Discontinued, Print     !! - Potential duplicate medications found. Please discuss with provider.         Harle BattiestElizabeth Wenona [place], NP 05/07/14 1542  Christie K Palumbo-Rasch, MD 05/10/14 [phone]

## 2014-05-07 NOTE — ED Notes (Signed)
Pt reports ?lymph node swelling and pain behind-behind her R ear.  Pt reports it started after she had her hair braided Christmas Eve.

## 2015-10-22 ENCOUNTER — Emergency Department (HOSPITAL_COMMUNITY)
Admission: EM | Admit: 2015-10-22 | Discharge: 2015-10-22 | Disposition: A | Discharge status: Home / Self Care | Payer: Self-pay | Attending: Emergency Medicine | Admitting: Emergency Medicine

## 2015-10-22 ENCOUNTER — Encounter (HOSPITAL_COMMUNITY): Payer: Self-pay | Admitting: Emergency Medicine

## 2015-10-22 ENCOUNTER — Emergency Department (HOSPITAL_COMMUNITY): Payer: Self-pay

## 2015-10-22 DIAGNOSIS — J209 Acute bronchitis, unspecified: Secondary | ICD-10-CM | POA: Insufficient documentation

## 2015-10-22 DIAGNOSIS — Z79899 Other long term (current) drug therapy: Secondary | ICD-10-CM | POA: Insufficient documentation

## 2015-10-22 DIAGNOSIS — F319 Bipolar disorder, unspecified: Secondary | ICD-10-CM | POA: Insufficient documentation

## 2015-10-22 MED ORDER — AZITHROMYCIN 250 MG PO TABS: 250.0000 mg | ORAL_TABLET | Freq: Every day | ORAL | Status: DC

## 2015-10-22 MED ORDER — PREDNISONE 20 MG PO TABS: 60.0000 mg | ORAL_TABLET | Freq: Every day | ORAL | Status: DC

## 2015-10-22 MED ORDER — PREDNISONE 20 MG PO TABS
60.0000 mg | ORAL_TABLET | Freq: Once | ORAL | Status: AC
  Administered 2015-10-22: 60 mg via ORAL
  Filled 2015-10-22: qty 3

## 2015-10-22 MED ORDER — ALBUTEROL SULFATE HFA 108 (90 BASE) MCG/ACT IN AERS
2.0000 | INHALATION_SPRAY | RESPIRATORY_TRACT | Status: DC | PRN
  Administered 2015-10-22: 2 via RESPIRATORY_TRACT
  Filled 2015-10-22: qty 6.7

## 2015-10-22 MED ORDER — IPRATROPIUM-ALBUTEROL 0.5-2.5 (3) MG/3ML IN SOLN
3.0000 mL | Freq: Once | RESPIRATORY_TRACT | Status: AC
  Administered 2015-10-22: 3 mL via RESPIRATORY_TRACT
  Filled 2015-10-22: qty 3

## 2015-10-22 NOTE — ED Notes (Signed)
Pt states she has had cold symptoms since Friday  Pt states she was hoarse but then started improving  Pt states yesterday she started having violent coughing spells and now the sputum is blood tinged  Pt states it hurts to cough  Pt states about 0130 she took a cough suppressant called [employer] tussin

## 2015-10-22 NOTE — Discharge Instructions (Signed)
Acute Bronchitis Bronchitis is inflammation of the airways that extend from the windpipe into the lungs (bronchi). The inflammation often causes mucus to develop. This leads to a cough, which is the most common symptom of bronchitis.  In acute bronchitis, the condition usually develops suddenly and goes away over time, usually in a couple weeks. Smoking, allergies, and asthma can make bronchitis worse. Repeated episodes of bronchitis may cause further lung problems.  CAUSES Acute bronchitis is most often caused by the same virus that causes a cold. The virus can spread from person to person (contagious) through coughing, sneezing, and touching contaminated objects. SIGNS AND SYMPTOMS   Cough.   Fever.   Coughing up mucus.   Body aches.   Chest congestion.   Chills.   Shortness of breath.   Sore throat.  DIAGNOSIS  Acute bronchitis is usually diagnosed through a physical exam. Your health care provider will also ask you questions about your medical history. Tests, such as chest X-rays, are sometimes done to rule out other conditions.  TREATMENT  Acute bronchitis usually goes away in a couple weeks. Oftentimes, no medical treatment is necessary. Medicines are sometimes given for relief of fever or cough. Antibiotic medicines are usually not needed but may be prescribed in certain situations. In some cases, an inhaler may be recommended to help reduce shortness of breath and control the cough. A cool mist vaporizer may also be used to help thin bronchial secretions and make it easier to clear the chest.  HOME CARE INSTRUCTIONS  Get plenty of rest.   Drink enough fluids to keep your urine clear or pale yellow (unless you have a medical condition that requires fluid restriction). Increasing fluids may help thin your respiratory secretions (sputum) and reduce chest congestion, and it will prevent dehydration.   Take medicines only as directed by your health care provider.  If  you were prescribed an antibiotic medicine, finish it all even if you start to feel better.  Avoid smoking and secondhand smoke. Exposure to cigarette smoke or irritating chemicals will make bronchitis worse. If you are a smoker, consider using nicotine gum or skin patches to help control withdrawal symptoms. Quitting smoking will help your lungs heal faster.   Reduce the chances of another bout of acute bronchitis by washing your hands frequently, avoiding people with cold symptoms, and trying not to touch your hands to your mouth, nose, or eyes.   Keep all follow-up visits as directed by your health care provider.  SEEK MEDICAL CARE IF: Your symptoms do not improve after 1 week of treatment.  SEEK IMMEDIATE MEDICAL CARE IF:  You develop an increased fever or chills.   You have chest pain.   You have severe shortness of breath.  You have bloody sputum.   You develop dehydration.  You faint or repeatedly feel like you are going to pass out.  You develop repeated vomiting.  You develop a severe headache. MAKE SURE YOU:   Understand these instructions.  Will watch your condition.  Will get help right away if you are not doing well or get worse.   This information is not intended to replace advice given to you by your health care provider. Make sure you discuss any questions you have with your health care provider.   Document Released: 06/02/2004 Document Revised: 05/16/2014 Document Reviewed: 10/16/2012 Elsevier Interactive Patient Education 2016 [employer]   Albuterol inhalation aerosol What is this medicine? ALBUTEROL (al Gaspar Bidding) is a bronchodilator. It helps  open up the airways in your lungs to make it easier to breathe. This medicine is used to treat and to prevent bronchospasm. This medicine may be used for other purposes; ask your health care provider or pharmacist if you have questions. What should I tell my health care provider before I take this  medicine? They need to know if you have any of the following conditions: -diabetes -heart disease or irregular heartbeat -high blood pressure -pheochromocytoma -seizures -thyroid disease -an unusual or allergic reaction to albuterol, levalbuterol, sulfites, other medicines, foods, dyes, or preservatives -pregnant or trying to get pregnant -breast-feeding How should I use this medicine? This medicine is for inhalation through the mouth. Follow the directions on your prescription label. Take your medicine at regular intervals. Do not use more often than directed. Make sure that you are using your inhaler correctly. Ask you doctor or health care provider if you have any questions. Talk to your pediatrician regarding the use of this medicine in children. Special care may be needed. Overdosage: If you think you have taken too much of this medicine contact a poison control center or emergency room at once. NOTE: This medicine is only for you. Do not share this medicine with others. What if I miss a dose? If you miss a dose, use it as soon as you can. If it is almost time for your next dose, use only that dose. Do not use double or extra doses. What may interact with this medicine? -anti-infectives like chloroquine and pentamidine -caffeine -cisapride -diuretics -medicines for colds -medicines for depression or for emotional or psychotic conditions -medicines for weight loss including some herbal products -methadone -some antibiotics like clarithromycin, erythromycin, levofloxacin, and linezolid -some heart medicines -steroid hormones like dexamethasone, cortisone, hydrocortisone -theophylline -thyroid hormones This list may not describe all possible interactions. Give your health care provider a list of all the medicines, herbs, non-prescription drugs, or dietary supplements you use. Also tell them if you smoke, drink alcohol, or use illegal drugs. Some items may interact with your  medicine. What should I watch for while using this medicine? Tell your doctor or health care professional if your symptoms do not improve. Do not use extra albuterol. If your asthma or bronchitis gets worse while you are using this medicine, call your doctor right away. If your mouth gets dry try chewing sugarless gum or sucking hard candy. Drink water as directed. What side effects may I notice from receiving this medicine? Side effects that you should report to your doctor or health care professional as soon as possible: -allergic reactions like skin rash, itching or hives, swelling of the face, lips, or tongue -breathing problems -chest pain -feeling faint or lightheaded, falls -high blood pressure -irregular heartbeat -fever -muscle cramps or weakness -pain, tingling, numbness in the hands or feet -vomiting Side effects that usually do not require medical attention (report to your doctor or health care professional if they continue or are bothersome): -cough -difficulty sleeping -headache -nervousness or trembling -stomach upset -stuffy or runny nose -throat irritation -unusual taste This list may not describe all possible side effects. Call your doctor for medical advice about side effects. You may report side effects to FDA at 1-800-FDA-1088. Where should I keep my medicine? Keep out of the reach of children. Store at room temperature between 15 and 30 degrees C (59 and 86 degrees F). The contents are under pressure and may burst when exposed to heat or flame. Do not freeze. This medicine does not work  as well if it is too cold. Throw away any unused medicine after the expiration date. Inhalers need to be thrown away after the labeled number of puffs have been used or by the expiration date; whichever comes first. Ventolin HFA should be thrown away 12 months after removing from foil pouch. Check the instructions that come with your medicine. NOTE: This sheet is a summary. It may  not cover all possible information. If you have questions about this medicine, talk to your doctor, pharmacist, or health care provider.    2016, Elsevier/Gold Standard. (2012-10-11 10:57:17)   Prednisone tablets What is this medicine? PREDNISONE (PRED ni sone) is a corticosteroid. It is commonly used to treat inflammation of the skin, joints, lungs, and other organs. Common conditions treated include asthma, allergies, and arthritis. It is also used for other conditions, such as blood disorders and diseases of the adrenal glands. This medicine may be used for other purposes; ask your health care provider or pharmacist if you have questions. What should I tell my health care provider before I take this medicine? They need to know if you have any of these conditions: -Cushing's syndrome -diabetes -glaucoma -heart disease -high blood pressure -infection (especially a virus infection such as chickenpox, cold sores, or herpes) -kidney disease -liver disease -mental illness -myasthenia gravis -osteoporosis -seizures -stomach or intestine problems -thyroid disease -an unusual or allergic reaction to lactose, prednisone, other medicines, foods, dyes, or preservatives -pregnant or trying to get pregnant -breast-feeding How should I use this medicine? Take this medicine by mouth with a glass of water. Follow the directions on the prescription label. Take this medicine with food. If you are taking this medicine once a day, take it in the morning. Do not take more medicine than you are told to take. Do not suddenly stop taking your medicine because you may develop a severe reaction. Your doctor will tell you how much medicine to take. If your doctor wants you to stop the medicine, the dose may be slowly lowered over time to avoid any side effects. Talk to your pediatrician regarding the use of this medicine in children. Special care may be needed. Overdosage: If you think you have taken too much  of this medicine contact a poison control center or emergency room at once. NOTE: This medicine is only for you. Do not share this medicine with others. What if I miss a dose? If you miss a dose, take it as soon as you can. If it is almost time for your next dose, talk to your doctor or health care professional. You may need to miss a dose or take an extra dose. Do not take double or extra doses without advice. What may interact with this medicine? Do not take this medicine with any of the following medications: -metyrapone -mifepristone This medicine may also interact with the following medications: -aminoglutethimide -amphotericin B -aspirin and aspirin-like medicines -barbiturates -certain medicines for diabetes, like glipizide or glyburide -cholestyramine -cholinesterase inhibitors -cyclosporine -digoxin -diuretics -ephedrine -female hormones, like estrogens and birth control pills -isoniazid -ketoconazole -NSAIDS, medicines for pain and inflammation, like ibuprofen or naproxen -phenytoin -rifampin -toxoids -vaccines -warfarin This list may not describe all possible interactions. Give your health care provider a list of all the medicines, herbs, non-prescription drugs, or dietary supplements you use. Also tell them if you smoke, drink alcohol, or use illegal drugs. Some items may interact with your medicine. What should I watch for while using this medicine? Visit your doctor or health care  professional for regular checks on your progress. If you are taking this medicine over a prolonged period, carry an identification card with your name and address, the type and dose of your medicine, and your doctor's name and address. This medicine may increase your risk of getting an infection. Tell your doctor or health care professional if you are around anyone with measles or chickenpox, or if you develop sores or blisters that do not heal properly. If you are going to have surgery, tell  your doctor or health care professional that you have taken this medicine within the last twelve months. Ask your doctor or health care professional about your diet. You may need to lower the amount of salt you eat. This medicine may affect blood sugar levels. If you have diabetes, check with your doctor or health care professional before you change your diet or the dose of your diabetic medicine. What side effects may I notice from receiving this medicine? Side effects that you should report to your doctor or health care professional as soon as possible: -allergic reactions like skin rash, itching or hives, swelling of the face, lips, or tongue -changes in emotions or moods -changes in vision -depressed mood -eye pain -fever or chills, cough, sore throat, pain or difficulty passing urine -increased thirst -swelling of ankles, feet Side effects that usually do not require medical attention (report to your doctor or health care professional if they continue or are bothersome): -confusion, excitement, restlessness -headache -nausea, vomiting -skin problems, acne, thin and shiny skin -trouble sleeping -weight gain This list may not describe all possible side effects. Call your doctor for medical advice about side effects. You may report side effects to FDA at 1-800-FDA-1088. Where should I keep my medicine? Keep out of the reach of children. Store at room temperature between 15 and 30 degrees C (59 and 86 degrees F). Protect from light. Keep container tightly closed. Throw away any unused medicine after the expiration date. NOTE: This sheet is a summary. It may not cover all possible information. If you have questions about this medicine, talk to your doctor, pharmacist, or health care provider.    2016, Elsevier/Gold Standard. (2010-12-09 10:57:14)      Azithromycin tablets What is this medicine? AZITHROMYCIN (az ith roe MYE sin) is a macrolide antibiotic. It is used to treat or  prevent certain kinds of bacterial infections. It will not work for colds, flu, or other viral infections. This medicine may be used for other purposes; ask your health care provider or pharmacist if you have questions. What should I tell my health care provider before I take this medicine? They need to know if you have any of these conditions: -kidney disease -liver disease -irregular heartbeat or heart disease -an unusual or allergic reaction to azithromycin, erythromycin, other macrolide antibiotics, foods, dyes, or preservatives -pregnant or trying to get pregnant -breast-feeding How should I use this medicine? Take this medicine by mouth with a full glass of water. Follow the directions on the prescription label. The tablets can be taken with food or on an empty stomach. If the medicine upsets your stomach, take it with food. Take your medicine at regular intervals. Do not take your medicine more often than directed. Take all of your medicine as directed even if you think your are better. Do not skip doses or stop your medicine early. Talk to your pediatrician regarding the use of this medicine in children. Special care may be needed. Overdosage: If you think you have  taken too much of this medicine contact a poison control center or emergency room at once. NOTE: This medicine is only for you. Do not share this medicine with others. What if I miss a dose? If you miss a dose, take it as soon as you can. If it is almost time for your next dose, take only that dose. Do not take double or extra doses. What may interact with this medicine? Do not take this medicine with any of the following medications: -lincomycin This medicine may also interact with the following medications: -amiodarone -antacids -birth control pills -cyclosporine -digoxin -magnesium -nelfinavir -phenytoin -warfarin This list may not describe all possible interactions. Give your health care provider a list of all the  medicines, herbs, non-prescription drugs, or dietary supplements you use. Also tell them if you smoke, drink alcohol, or use illegal drugs. Some items may interact with your medicine. What should I watch for while using this medicine? Tell your doctor or health care professional if your symptoms do not improve. Do not treat diarrhea with over the counter products. Contact your doctor if you have diarrhea that lasts more than 2 days or if it is severe and watery. This medicine can make you more sensitive to the sun. Keep out of the sun. If you cannot avoid being in the sun, wear protective clothing and use sunscreen. Do not use sun lamps or tanning beds/booths. What side effects may I notice from receiving this medicine? Side effects that you should report to your doctor or health care professional as soon as possible: -allergic reactions like skin rash, itching or hives, swelling of the face, lips, or tongue -confusion, nightmares or hallucinations -dark urine -difficulty breathing -hearing loss -irregular heartbeat or chest pain -pain or difficulty passing urine -redness, blistering, peeling or loosening of the skin, including inside the mouth -white patches or sores in the mouth -yellowing of the eyes or skin Side effects that usually do not require medical attention (report to your doctor or health care professional if they continue or are bothersome): -diarrhea -dizziness, drowsiness -headache -stomach upset or vomiting -tooth discoloration -vaginal irritation This list may not describe all possible side effects. Call your doctor for medical advice about side effects. You may report side effects to FDA at 1-800-FDA-1088. Where should I keep my medicine? Keep out of the reach of children. Store at room temperature between 15 and 30 degrees C (59 and 86 degrees F). Throw away any unused medicine after the expiration date. NOTE: This sheet is a summary. It may not cover all possible  information. If you have questions about this medicine, talk to your doctor, pharmacist, or health care provider.    2016, Elsevier/Gold Standard. (2012-11-29 15:38:48)

## 2015-10-22 NOTE — ED Notes (Signed)
Patient transported to X-ray 

## 2015-10-22 NOTE — ED Provider Notes (Signed)
CSN: [id number]     Arrival date & time 10/22/15  0424 History   First MD Initiated Contact with Patient 10/22/15 [phone]     Chief Complaint  Patient presents with  . Cough     (Consider location/radiation/quality/duration/timing/severity/associated sxs/prior Treatment) Patient is a 45 y.o. female presenting with cough. The history is provided by the patient.  Cough She has had a cough for the last 5 days. Cough is productive of some yellowish sputum. She had subjective fever but no chills or sweats. She initially lost her voice, but has been regaining her voice. The cough had been improving for the last 3 days, but she had a severe paroxysm tonight. This was associated with some streaks of blood in the sputum. She felt like she was going to vomit but didn't. She has chronic sinusitis and there is been no change in her nasal drainage. She denies chest pain. There is no abdominal pain. She denies arthralgias or myalgias. She has been taking over-the-counter dextromethorphan which does give her some temporary relief.  Past Medical History  Diagnosis Date  . Bipolar 1 disorder (HCC)   . Acid reflux    History reviewed. No pertinent past surgical history. Family History  Problem Relation Age of Onset  . Mental illness Other    Social History  Substance Use Topics  . Smoking status: Never Smoker   . Smokeless tobacco: None  . Alcohol Use: No   OB History    No data available     Review of Systems  Respiratory: Positive for cough.   All other systems reviewed and are negative.     Allergies  Amoxicillin; Hydrocodone; and Propoxyphene n-acetaminophen  Home Medications   Prior to Admission medications   Medication Sig Start Date End Date Taking? Authorizing Provider  famotidine (PEPCID) 10 MG tablet Take 10 mg by mouth daily as needed for heartburn or indigestion.   Yes Historical Provider, MD  fluticasone (FLONASE) 50 MCG/ACT nasal spray Place 1 spray into both nostrils daily.    Yes Historical Provider, MD  guaifenesin (ROBITUSSIN) 100 MG/5ML syrup Take 400 mg by mouth 3 (three) times daily as needed for cough.   Yes Historical Provider, MD  loratadine-pseudoephedrine (CLARITIN-D 24-HOUR) 10-240 MG 24 hr tablet Take 1 tablet by mouth daily.   Yes Historical Provider, MD  bacitracin ointment Apply 1 application topically 2 (two) times daily. Patient not taking: Reported on 10/22/2015 05/07/14   Pricilla LovelessScott Goldston, MD  clindamycin (CLINDAGEL) 1 % gel Apply topically 2 (two) times daily. Patient not taking: Reported on 10/22/2015 05/07/14   Harle BattiestElizabeth Tysinger, NP  HYDROcodone-acetaminophen (NORCO/VICODIN) 5-325 MG per tablet Take 1-2 tablets every 6 hours as needed for severe pain Patient not taking: Reported on 05/07/2014 02/08/14   Renne CriglerJoshua Geiple, PA-C  meclizine (ANTIVERT) 25 MG tablet Take 1 tablet (25 mg total) by mouth 4 (four) times daily. Patient not taking: Reported on 05/07/2014 04/14/13   Sunnie NielsenBrian Opitz, MD  meclizine (ANTIVERT) 50 MG tablet Take 0.5 tablets (25 mg total) by mouth 3 (three) times daily as needed. Patient not taking: Reported on 05/07/2014 04/06/13   Azalia BilisKevin Campos, MD  methocarbamol (ROBAXIN) 500 MG tablet Take 1 tablet (500 mg total) by mouth 2 (two) times daily. Patient not taking: Reported on 05/07/2014 03/05/14   Felicie Mornavid Smith, NP  naproxen (NAPROSYN) 500 MG tablet Take 1 tablet (500 mg total) by mouth 2 (two) times daily. Patient not taking: Reported on 05/07/2014 02/08/14   Renne CriglerJoshua Geiple, PA-C  naproxen (  NAPROSYN) 500 MG tablet Take 1 tablet (500 mg total) by mouth 2 (two) times daily. Patient not taking: Reported on 10/22/2015 05/07/14   Harle Battiest, NP  naproxen sodium (ANAPROX) 275 MG tablet Take 1 tablet (275 mg total) by mouth 2 (two) times daily with a meal. Patient not taking: Reported on 05/07/2014 03/05/14   Felicie Morn, NP  ondansetron (ZOFRAN) 4 MG tablet Take 1 tablet (4 mg total) by mouth every 6 (six) hours. Patient not taking:  Reported on 05/07/2014 04/14/13   Sunnie Nielsen, MD   BP 146/67 mmHg  Pulse 93  Temp(Src) 98 F (36.7 C) (Oral)  Resp 18  Ht  (1.651 m)  Wt 330 lb (149.687 kg)  BMI 54.91 kg/m2  SpO2 97%  LMP 10/06/2015 (Approximate) Physical Exam  Nursing note and vitals reviewed.  Obese 45 year old female, resting comfortably and in no acute distress. Vital signs are significant for hypertension. Oxygen saturation is 97%, which is normal. Head is normocephalic and atraumatic. PERRLA, EOMI. Oropharynx is clear. Neck is nontender and supple without adenopathy or JVD. Back is nontender and there is no CVA tenderness. Lungs are clear without rales, wheezes, or rhonchi. There is slightly prolonged exhalation phase with minimal wheezing on forced exhalation. Chest is nontender. Heart has regular rate and rhythm without murmur. Abdomen is soft, flat, nontender without masses or hepatosplenomegaly and peristalsis is normoactive. Extremities have trace edema, full range of motion is present. Skin is warm and dry without rash. Neurologic: Mental status is normal, cranial nerves are intact, there are no motor or sensory deficits.  ED Course  Procedures (including critical care time)  Imaging Review Dg Chest 2 View  10/22/2015  CLINICAL DATA:  Initial evaluation for acute cough. EXAM: CHEST  2 VIEW COMPARISON:  Prior radiograph from 08/28/2009. FINDINGS: Cardiac and mediastinal silhouettes are within normal limits. Lungs are hypoinflated. Mild scattered bronchitic changes. No focal airspace disease. No pulmonary edema or pleural effusion. No pneumothorax. No acute osseous abnormality. IMPRESSION: Mild scattered peribronchial thickening, which may reflect acute bronchiolitis in the setting of cough. No consolidative opacity to suggest pneumonia. Electronically Signed   By: Rise Mu M.D.   On: 10/22/2015 06:33   I have personally reviewed and evaluated these images as part of my medical  decision-making.   MDM   Final diagnoses:  Acute bronchitis, unspecified organism    Respiratory tract infection. Chest x-ray shows no pneumonia. Old records are reviewed and she has no relevant past visits. She will be given a therapeutic trial of albuterol with ipratropium. Given the fact that her symptoms have been improving before worsening tonight suggests possible bacterial superinfection, so she will be treated with antibiotics.  She had significant relief with albuterol nebulizer treatment. She is discharged with prescriptions for prednisone and azithromycin and is given an albuterol inhaler to take home.Dione Booze, MD 10/22/15 [phone]

## 2015-10-22 NOTE — ED Notes (Signed)
Patient ambulated to bathroom without assistance. 

## 2015-10-22 NOTE — ED Notes (Signed)
Patient states "I feel better" after breathing treatment.

## 2015-10-22 NOTE — ED Notes (Signed)
MD at bedside. 

## 2015-10-29 ENCOUNTER — Emergency Department (HOSPITAL_COMMUNITY)
Admission: EM | Admit: 2015-10-29 | Discharge: 2015-10-30 | Disposition: A | Discharge status: Home / Self Care | Payer: Managed Care, Other (non HMO) | Attending: Emergency Medicine | Admitting: Emergency Medicine

## 2015-10-29 ENCOUNTER — Encounter (HOSPITAL_COMMUNITY): Payer: Self-pay | Admitting: Emergency Medicine

## 2015-10-29 DIAGNOSIS — T7840XA Allergy, unspecified, initial encounter: Secondary | ICD-10-CM

## 2015-10-29 DIAGNOSIS — Z791 Long term (current) use of non-steroidal anti-inflammatories (NSAID): Secondary | ICD-10-CM | POA: Insufficient documentation

## 2015-10-29 DIAGNOSIS — F319 Bipolar disorder, unspecified: Secondary | ICD-10-CM | POA: Insufficient documentation

## 2015-10-29 DIAGNOSIS — Z7951 Long term (current) use of inhaled steroids: Secondary | ICD-10-CM | POA: Insufficient documentation

## 2015-10-29 DIAGNOSIS — Z79899 Other long term (current) drug therapy: Secondary | ICD-10-CM | POA: Insufficient documentation

## 2015-10-29 MED ORDER — FAMOTIDINE IN NACL 20-0.9 MG/50ML-% IV SOLN
20.0000 mg | Freq: Once | INTRAVENOUS | Status: AC
  Administered 2015-10-30: 20 mg via INTRAVENOUS
  Filled 2015-10-29: qty 50

## 2015-10-29 MED ORDER — LORATADINE 10 MG PO TABS
10.0000 mg | ORAL_TABLET | Freq: Once | ORAL | Status: AC
  Administered 2015-10-30: 10 mg via ORAL
  Filled 2015-10-29: qty 1

## 2015-10-29 MED ORDER — METHYLPREDNISOLONE SODIUM SUCC 125 MG IJ SOLR
125.0000 mg | Freq: Once | INTRAMUSCULAR | Status: AC
  Administered 2015-10-30: 125 mg via INTRAVENOUS
  Filled 2015-10-29: qty 2

## 2015-10-29 NOTE — ED Notes (Signed)
Pt states she got bit by something under her right arm  Pt states it was burning and itching  Pt states shortly after her tongue, lips, and gums started burning like hot razor blades then her throat started feeling sore and now it is hard to swallow like something is stuck in her throat  Pt states some of her fingertips are feeling tight and itching  Pt states this all started about 2 hrs ago

## 2015-10-29 NOTE — ED Provider Notes (Signed)
CSN: [id number]     Arrival date & time 10/29/15  2335 History  By signing my name below, I, Phillis Haggis, attest that this documentation has been prepared under the direction and in the presence of Yarden Hillis, MD. Electronically Signed: Phillis Haggis, ED Scribe. 10/29/2015. 12:00 AM.  Chief Complaint  Patient presents with  . Allergic Reaction   Patient is a 45 y.o. female presenting with allergic reaction. The history is provided by the patient. No language interpreter was used.  Allergic Reaction Presenting symptoms: itching   Presenting symptoms: no swelling   Severity:  Mild Prior allergic episodes:  No prior episodes Context: insect bite/sting   Context: no medications and no new detergents/soaps   Ineffective treatments:  None tried HPI Comments: Cristle Jared is a 45 y.o. female who presents to the Emergency Department complaining of an insect bite to the posterior right upper arm onset 3 hours ago. Pt states that she was inside her home when she was bit by an unknown insect. She reports associated burning sensation to the lips, tongue, and gums that felt like "razor blades." She states that her throat is hurting and is feeling itching to the fingertips. She denies trouble swallowing or swelling to oral cavities.   Past Medical History  Diagnosis Date  . Bipolar 1 disorder (HCC)   . Acid reflux    History reviewed. No pertinent past surgical history. Family History  Problem Relation Age of Onset  . Mental illness Other    Social History  Substance Use Topics  . Smoking status: Never Smoker   . Smokeless tobacco: None  . Alcohol Use: No   OB History    No data available     Review of Systems  HENT: Positive for sore throat. Negative for drooling and facial swelling.   Skin: Positive for itching.  All other systems reviewed and are negative.  Allergies  Amoxicillin; Hydrocodone; and Propoxyphene n-acetaminophen  Home Medications   Prior to  Admission medications   Medication Sig Start Date End Date Taking? Authorizing Provider  azithromycin (ZITHROMAX) 250 MG tablet Take 1 tablet (250 mg total) by mouth daily. Take first 2 tablets together, then 1 every day until finished. 10/22/15   Dione Booze, MD  famotidine (PEPCID) 10 MG tablet Take 10 mg by mouth daily as needed for heartburn or indigestion.    Historical Provider, MD  fluticasone (FLONASE) 50 MCG/ACT nasal spray Place 1 spray into both nostrils daily.    Historical Provider, MD  guaifenesin (ROBITUSSIN) 100 MG/5ML syrup Take 400 mg by mouth 3 (three) times daily as needed for cough.    Historical Provider, MD  loratadine-pseudoephedrine (CLARITIN-D 24-HOUR) 10-240 MG 24 hr tablet Take 1 tablet by mouth daily.    Historical Provider, MD  predniSONE (DELTASONE) 20 MG tablet Take 3 tablets (60 mg total) by mouth daily. 10/22/15   Dione Booze, MD   BP 159/93 mmHg  Pulse 90  Temp(Src) 98.4 F (36.9 C) (Oral)  Resp 20  SpO2 97%  LMP 10/06/2015 (Approximate) Physical Exam  Constitutional: She is oriented to person, place, and time. She appears well-developed and well-nourished. No distress.  HENT:  Head: Normocephalic and atraumatic.  Mouth/Throat: Oropharynx is clear and moist. No oropharyngeal exudate.  No swelling of lips, tongue, or uvula; intact phonation; Trachea midline  Eyes: Conjunctivae and EOM are normal. Pupils are equal, round, and reactive to light.  Neck: Trachea normal and normal range of motion. Neck supple. No JVD present. Carotid  bruit is not present.  No swelling of the neck  Cardiovascular: Normal rate and regular rhythm.  Exam reveals no gallop and no friction rub.   No murmur heard. Pulmonary/Chest: Effort normal and breath sounds normal. No stridor. She has no wheezes. She has no rhonchi. She has no rales.  Abdominal: Soft. Bowel sounds are normal. She exhibits no mass. There is no tenderness. There is no rebound and no guarding.  Musculoskeletal:  Normal range of motion.  Lymphadenopathy:    She has no cervical adenopathy.  Neurological: She is alert and oriented to person, place, and time. She has normal reflexes. No cranial nerve deficit. She exhibits normal muscle tone. Coordination normal.  Cranial nerves 2-12 intact  Skin: Skin is warm and dry. She is not diaphoretic.  Lesion of 1 cm in diameter to the posterior right upper extremity; Isolated envenomation; no stinger present; no erythema, edema, or warmth  Psychiatric: She has a normal mood and affect. Her behavior is normal.  Nursing note and vitals reviewed.   ED Course  Procedures (including critical care time) DIAGNOSTIC STUDIES: Oxygen Saturation is 97% on RA, normal by my interpretation.    COORDINATION OF CARE: 11:56 PM-Discussed treatment plan which includes Benadryl with pt at bedside and pt agreed to plan.    Labs Review Labs Reviewed - No data to display  Imaging Review No results found. I have personally reviewed and evaluated these images and lab results as part of my medical decision-making.   EKG Interpretation None      MDM    Medications  methylPREDNISolone sodium succinate (SOLU-MEDROL) 125 mg/2 mL injection 125 mg (not administered)  famotidine (PEPCID) IVPB 20 mg premix (not administered)  loratadine (CLARITIN) tablet 10 mg (not administered)   Filed Vitals:   10/29/15 2339  BP: 159/93  Pulse: 90  Temp: 98.4 F (36.9 C)  Resp: 20     Final diagnoses:  None     Medication List    TAKE these medications        famotidine 20 MG tablet  Commonly known as:  PEPCID  Take 1 tablet (20 mg total) by mouth 2 (two) times daily.     famotidine 10 MG tablet  Commonly known as:  PEPCID  Take 10 mg by mouth daily as needed for heartburn or indigestion.     predniSONE 20 MG tablet  Commonly known as:  DELTASONE  Take 3 tablets (60 mg total) by mouth daily.     predniSONE 20 MG tablet  Commonly known as:  DELTASONE  3 tabs po day  one, then 2 po daily x 4 days      ASK your doctor about these medications        acetaminophen 500 MG tablet  Commonly known as:  TYLENOL  Take 1,000 mg by mouth every 6 (six) hours as needed for mild pain.     azithromycin 250 MG tablet  Commonly known as:  ZITHROMAX  Take 1 tablet (250 mg total) by mouth daily. Take first 2 tablets together, then 1 every day until finished.     fluticasone 50 MCG/ACT nasal spray  Commonly known as:  FLONASE  Place 1 spray into both nostrils daily.     guaifenesin 100 MG/5ML syrup  Commonly known as:  ROBITUSSIN  Take 400 mg by mouth 3 (three) times daily as needed for cough.     LATUDA 20 MG Tabs tablet  Generic drug:  lurasidone  Take 10 mg by  mouth 2 (two) times daily.     loratadine-pseudoephedrine 10-240 MG 24 hr tablet  Commonly known as:  CLARITIN-D 24-hour  Take 1 tablet by mouth daily.         Improved with meds. PO challenge successfully. Will discharge with Pepcid and steroids for five days. Advised Benadryl every 6 hours as needed for itching. Strict return precautions discussed. Advised to return to the ED if pt begins to experience oropharyngeal swelling, fever, nausea, vomiting, or SOB.   I personally performed the services described in this documentation, which was scribed in my presence. The recorded information has been reviewed and is accurate.      Cy BlamerApril Anslie Spadafora, MD 10/30/15 [phone]

## 2015-10-30 ENCOUNTER — Encounter (HOSPITAL_COMMUNITY): Payer: Self-pay | Admitting: Emergency Medicine

## 2015-10-30 MED ORDER — PREDNISONE 20 MG PO TABS: ORAL_TABLET | ORAL | Status: DC

## 2015-10-30 MED ORDER — FAMOTIDINE 20 MG PO TABS: 20.0000 mg | ORAL_TABLET | Freq: Two times a day (BID) | ORAL | Status: DC

## 2015-10-30 NOTE — Discharge Instructions (Signed)

## 2015-12-21 ENCOUNTER — Emergency Department (HOSPITAL_COMMUNITY)
Admission: EM | Admit: 2015-12-21 | Discharge: 2015-12-21 | Disposition: A | Discharge status: Home / Self Care | Payer: Managed Care, Other (non HMO) | Attending: Emergency Medicine | Admitting: Emergency Medicine

## 2015-12-21 ENCOUNTER — Emergency Department (HOSPITAL_COMMUNITY): Payer: Managed Care, Other (non HMO)

## 2015-12-21 ENCOUNTER — Encounter (HOSPITAL_COMMUNITY): Payer: Self-pay | Admitting: Emergency Medicine

## 2015-12-21 DIAGNOSIS — W228XXA Striking against or struck by other objects, initial encounter: Secondary | ICD-10-CM | POA: Insufficient documentation

## 2015-12-21 DIAGNOSIS — Z7952 Long term (current) use of systemic steroids: Secondary | ICD-10-CM | POA: Insufficient documentation

## 2015-12-21 DIAGNOSIS — Y9301 Activity, walking, marching and hiking: Secondary | ICD-10-CM | POA: Insufficient documentation

## 2015-12-21 DIAGNOSIS — Z792 Long term (current) use of antibiotics: Secondary | ICD-10-CM | POA: Insufficient documentation

## 2015-12-21 DIAGNOSIS — Y999 Unspecified external cause status: Secondary | ICD-10-CM | POA: Insufficient documentation

## 2015-12-21 DIAGNOSIS — Y9289 Other specified places as the place of occurrence of the external cause: Secondary | ICD-10-CM | POA: Insufficient documentation

## 2015-12-21 DIAGNOSIS — S9031XA Contusion of right foot, initial encounter: Secondary | ICD-10-CM

## 2015-12-21 DIAGNOSIS — Z79899 Other long term (current) drug therapy: Secondary | ICD-10-CM | POA: Insufficient documentation

## 2015-12-21 MED ORDER — DICLOFENAC SODIUM 50 MG PO TBEC: 50.0000 mg | DELAYED_RELEASE_TABLET | Freq: Two times a day (BID) | ORAL | 0 refills | Status: DC

## 2015-12-21 NOTE — ED Triage Notes (Signed)
Pt complaint of chronic left foot pain related to planter fascitis and acute right foot pain related to accidentally kicking coffee table a few days ago. Pt ambulatory but reports painful when walking.

## 2015-12-21 NOTE — ED Provider Notes (Signed)
[facility]-EMERGENCY DEPT Provider Note   CSN: [id number] Arrival date & time: 12/21/15  1716  By signing my name below, I, Soijett Blue, attest that this documentation has been prepared under the direction and in the presence of Langston MaskerKaren Aadyn Buchheit, PA-C Electronically Signed: Soijett Blue, ED Scribe. 12/21/15. 8:54 PM.   History   Chief Complaint Chief Complaint  Patient presents with  . Foot Pain    HPI Christie Walker is a 45 y.o. female who presents to the Emergency Department complaining of right foot pain onset 3 days. Pt notes that she accidentally kicked a coffee table while ambulating in her living room. Pt is having associated symptoms of right foot swelling, gait problem due to pain, bruising to right foot, and chronic left foot pain. Pt states that she searched [employer] for her chronic left foot pain and self diagnosed herself with plantar fascitis. She notes that she has tried cool compresses with no relief of her symptoms. She denies color change, wound, rash, right ankle pain, right shin pain, and any other symptoms.    The history is provided by the patient. No language interpreter was used.    Past Medical History:  Diagnosis Date  . Acid reflux   . Bipolar 1 disorder [facility](HCC)     Patient Active Problem List   Diagnosis Date Noted  . OBESITY 03/08/2007  . TMJ SYNDROME 03/08/2007  . GERD 03/08/2007    History reviewed. No pertinent surgical history.  OB History    No data available       Home Medications    Prior to Admission medications   Medication Sig Start Date End Date Taking? Authorizing Provider  acetaminophen (TYLENOL) 500 MG tablet Take 1,000 mg by mouth every 6 (six) hours as needed for mild pain.    Historical Provider, MD  azithromycin (ZITHROMAX) 250 MG tablet Take 1 tablet (250 mg total) by mouth daily. Take first 2 tablets together, then 1 every day until finished. Patient not taking: Reported on 10/30/2015 10/22/15   Dione Boozeavid Glick, MD  famotidine  (PEPCID) 10 MG tablet Take 10 mg by mouth daily as needed for heartburn or indigestion.    Historical Provider, MD  famotidine (PEPCID) 20 MG tablet Take 1 tablet (20 mg total) by mouth 2 (two) times daily. 10/30/15   April Palumbo, MD  fluticasone [facility](FLONASE) 50 MCG/ACT nasal spray Place 1 spray into both nostrils daily.    Historical Provider, MD  guaifenesin (ROBITUSSIN) 100 MG/5ML syrup Take 400 mg by mouth 3 (three) times daily as needed for cough.    Historical Provider, MD  loratadine-pseudoephedrine (CLARITIN-D 24-HOUR) 10-240 MG 24 hr tablet Take 1 tablet by mouth daily.    Historical Provider, MD  lurasidone (LATUDA) 20 MG TABS tablet Take 10 mg by mouth 2 (two) times daily.    Historical Provider, MD  predniSONE (DELTASONE) 20 MG tablet Take 3 tablets (60 mg total) by mouth daily. Patient not taking: Reported on 10/30/2015 10/22/15   Dione Boozeavid Glick, MD  predniSONE (DELTASONE) 20 MG tablet 3 tabs po day one, then 2 po daily x 4 days 10/30/15   April Palumbo, MD    Family History Family History  Problem Relation Age of Onset  . Mental illness Other     Social History Social History  Substance Use Topics  . Smoking status: Never Smoker  . Smokeless tobacco: Not on file  . Alcohol use No     Allergies   Amoxicillin; Hydrocodone; and Propoxyphene n-acetaminophen  Review of Systems Review of Systems  Musculoskeletal: Positive for arthralgias (right foot), gait problem (due to pain) and joint swelling (right foot).  Skin: Positive for color change (bruising to right foot). Negative for rash and wound.     Physical Exam Updated Vital Signs BP 126/63 (BP Location: Left Arm)   Pulse 86   Temp 98.3 F (36.8 C) (Oral)   Resp 18   Ht 5\' 5"  (1.651 m)   Wt (!) 330 lb (149.7 kg)   LMP 11/30/2015   SpO2 98%   BMI 54.91 kg/m   Physical Exam  Constitutional: She is oriented to person, place, and time. She appears well-developed and well-nourished. No distress.  HENT:  Head:  Normocephalic and atraumatic.  Eyes: EOM are normal.  Neck: Neck supple.  Cardiovascular: Normal rate.   Pulmonary/Chest: Effort normal. No respiratory distress.  Abdominal: She exhibits no distension.  Musculoskeletal: Normal range of motion.       Right ankle: Normal.       Right foot: There is tenderness and swelling. There is normal range of motion.  Swollen right medial foot. Tender over fifth metatarsal. FROM of toes. Right ankle non-tender.   Neurological: She is alert and oriented to person, place, and time.  Skin: Skin is warm and dry.  Psychiatric: She has a normal mood and affect. Her behavior is normal.  Nursing note and vitals reviewed.    ED Treatments / Results  DIAGNOSTIC STUDIES: Oxygen Saturation is 98% on RA, nl by my interpretation.    COORDINATION OF CARE: 8:54 PM Discussed treatment plan with pt at bedside which includes right foot xray, ice, referral and follow up with orthopedist, and pt agreed to plan.   Labs (all labs ordered are listed, but only abnormal results are displayed) Labs Reviewed - No data to display  EKG  EKG Interpretation None       Radiology Dg Foot Complete Right  Result Date: 12/21/2015 CLINICAL DATA:  Injury.  Pain. EXAM: RIGHT FOOT COMPLETE - 3+ VIEW COMPARISON:  11/13/2012 FINDINGS: No definite fracture or dislocation. Marked soft tissue swelling throughout the foot and visualized lower leg. No radiopaque foreign body. Plantar spur. IMPRESSION: No definite fracture or dislocation. Marked soft tissue swelling throughout the foot and visualized lower leg, increased from priors. Electronically Signed   By: Elsie Stain M.D.   On: 12/21/2015 19:47    Procedures Procedures (including critical care time)  Medications Ordered in ED Medications - No data to display   Initial Impression / Assessment and Plan / ED Course  I have reviewed the triage vital signs and the nursing notes.  Pertinent maging results that were available  during my care of the patient were reviewed by me and considered in my medical decision making (see chart for details).  Clinical Course     Patient X-Ray negative for obvious fracture or dislocation.  Pt advised to follow up with orthopedics. Patient given ace wrap and post op shoe while in ED, conservative therapy recommended and discussed. Patient will be discharged home & is agreeable with above plan. Returns precautions discussed. Pt appears safe for discharge.  Final Clinical Impressions(s) / ED Diagnoses   Final diagnoses:  Foot contusion, right, initial encounter    New Prescriptions New Prescriptions   No medications on file   An After Visit Summary was printed and given to the patient. Meds ordered this encounter  Medications  . diclofenac (VOLTAREN) 50 MG EC tablet    Sig: Take  1 tablet (50 mg total) by mouth 2 (two) times daily.    Dispense:  20 tablet    Refill:  0    Order Specific Question:   Supervising Provider    Answer:   Eber HongMILLER, BRIAN [[id number]]     Lonia SkinnerLeslie K Ocean ViewSofia, PA-C 12/21/15 2148    Nira ConnPedro Eduardo Cardama, MD 12/23/15 [phone]

## 2016-06-10 ENCOUNTER — Ambulatory Visit: Payer: Managed Care, Other (non HMO) | Admitting: Family Medicine

## 2016-06-14 ENCOUNTER — Ambulatory Visit (INDEPENDENT_AMBULATORY_CARE_PROVIDER_SITE_OTHER): Payer: Self-pay | Admitting: Family Medicine

## 2016-06-14 ENCOUNTER — Encounter: Payer: Self-pay | Admitting: Family Medicine

## 2016-06-14 VITALS — BP 119/69 | HR 91 | Temp 98.1°F | Resp 18 | Ht 64.5 in | Wt 346.0 lb

## 2016-06-14 DIAGNOSIS — R7303 Prediabetes: Secondary | ICD-10-CM

## 2016-06-14 DIAGNOSIS — Z1239 Encounter for other screening for malignant neoplasm of breast: Secondary | ICD-10-CM

## 2016-06-14 DIAGNOSIS — K219 Gastro-esophageal reflux disease without esophagitis: Secondary | ICD-10-CM

## 2016-06-14 DIAGNOSIS — R0683 Snoring: Secondary | ICD-10-CM

## 2016-06-14 DIAGNOSIS — E8881 Metabolic syndrome: Secondary | ICD-10-CM

## 2016-06-14 DIAGNOSIS — G4730 Sleep apnea, unspecified: Secondary | ICD-10-CM

## 2016-06-14 DIAGNOSIS — I10 Essential (primary) hypertension: Secondary | ICD-10-CM

## 2016-06-14 DIAGNOSIS — G8929 Other chronic pain: Secondary | ICD-10-CM

## 2016-06-14 DIAGNOSIS — M25569 Pain in unspecified knee: Secondary | ICD-10-CM

## 2016-06-14 DIAGNOSIS — Z1231 Encounter for screening mammogram for malignant neoplasm of breast: Secondary | ICD-10-CM

## 2016-06-14 HISTORY — DX: Essential (primary) hypertension: I10

## 2016-06-14 LAB — COMPLETE METABOLIC PANEL WITH GFR
ALT: 17 U/L (ref 6–29)
AST: 18 U/L (ref 10–30)
Albumin: 3.8 g/dL (ref 3.6–5.1)
Alkaline Phosphatase: 76 U/L (ref 33–115)
BUN: 13 mg/dL (ref 7–25)
CHLORIDE: 101 mmol/L (ref 98–110)
CO2: 29 mmol/L (ref 20–31)
CREATININE: 0.82 mg/dL (ref 0.50–1.10)
Calcium: 9.6 mg/dL (ref 8.6–10.2)
GFR, EST NON AFRICAN AMERICAN: 87 mL/min (ref >=60)
GFR, Est African American: >89 mL/min (ref >=60)
GLUCOSE: 100 mg/dL — AB (ref 65–99)
Potassium: 4 mmol/L (ref 3.5–5.3)
SODIUM: 140 mmol/L (ref 135–146)
TOTAL PROTEIN: 6.8 g/dL (ref 6.1–8.1)
Total Bilirubin: 0.3 mg/dL (ref 0.2–1.2)

## 2016-06-14 LAB — POCT URINALYSIS DIP (DEVICE)
Bilirubin Urine: NEGATIVE
GLUCOSE, UA: NEGATIVE mg/dL
HGB URINE DIPSTICK: NEGATIVE
KETONES UR: NEGATIVE mg/dL
LEUKOCYTES UA: NEGATIVE
Nitrite: NEGATIVE
Protein, ur: NEGATIVE mg/dL
UROBILINOGEN UA: 0.2 mg/dL (ref 0.0–1.0)
pH: 5.5 (ref 5.0–8.0)

## 2016-06-14 LAB — LIPID PANEL
Cholesterol: 202 mg/dL — ABNORMAL HIGH (ref <200)
HDL: 44 mg/dL — ABNORMAL LOW (ref >50)
LDL CALC: 126 mg/dL — AB (ref <100)
Total CHOL/HDL Ratio: 4.6 Ratio (ref <5.0)
Triglycerides: 158 mg/dL — ABNORMAL HIGH (ref <150)
VLDL: 32 mg/dL — AB (ref <30)

## 2016-06-14 LAB — POCT GLYCOSYLATED HEMOGLOBIN (HGB A1C): Hemoglobin A1C: 6.3

## 2016-06-14 LAB — TSH: TSH: 3.06 mIU/L

## 2016-06-14 MED ORDER — MENTHOL (TOPICAL ANALGESIC) 4 % EX GEL: 1.0000 | Freq: Four times a day (QID) | CUTANEOUS | 2 refills | Status: DC | PRN

## 2016-06-14 NOTE — Progress Notes (Signed)
Subjective:    Patient ID: Christie Walker, female    DOB: 25-Oct-1970, 46 y.o.   MRN: [id number]  HPI [name], a 46 year old female with a history of hypertension and weight gain presents to establish care. She says that she was a patient of [facility] on [address], but has been lost to follow up. She says that she has been gaining weight. She works 3rd shift and typically comes home and eats a full course meal. She is not exercising consistently.  Patient denies foot ulcerations, increase appetite, nausea, paresthesia of the feet, polydipsia, polyuria and visual disturbances.  She also has a history of hypertension. She is not exercising and is not adherent to low salt diet.  She does not check blood pressures at home. Cardiovascular risk factors include: obesity (BMI >= 30 kg/m2) and sedentary lifestyle.   Past Medical History:  Diagnosis Date  . Acid reflux   . Bipolar 1 disorder (HCC)   . Essential hypertension 06/14/2016   Social History   Social History  . Marital status: Legally Separated    Spouse name: N/A  . Number of children: N/A  . Years of education: N/A   Occupational History  . Not on file.   Social History Main Topics  . Smoking status: Never Smoker  . Smokeless tobacco: Never Used  . Alcohol use No  . Drug use: No  . Sexual activity: Not on file   Other Topics Concern  . Not on file   Social History Narrative  . No narrative on file   Review of Systems  Constitutional: Positive for unexpected weight change (weight gain).  HENT: Negative.   Eyes: Negative.   Respiratory: Negative.   Cardiovascular: Negative.  Negative for chest pain, palpitations and leg swelling.  Gastrointestinal:       Heart burn  Endocrine: Positive for polyuria. Negative for polydipsia and polyphagia.  Genitourinary: Negative.   Musculoskeletal: Negative.   Skin: Negative.   Allergic/Immunologic: Negative for immunocompromised state.   Neurological: Negative.   Hematological: Negative.   Psychiatric/Behavioral: Negative.        Objective:   Physical Exam  Constitutional: She is oriented to person, place, and time. She appears well-developed and well-nourished.  Morbid obesity  HENT:  Head: Normocephalic and atraumatic.  Right Ear: External ear normal.  Mouth/Throat: Oropharynx is clear and moist.  Eyes: Conjunctivae and EOM are normal. Pupils are equal, round, and reactive to light.  Neck: Normal range of motion. Neck supple.  Pulmonary/Chest: Effort normal and breath sounds normal.  Abdominal: Soft. Bowel sounds are normal.  Abdomen pendulous   Musculoskeletal: Normal range of motion.  Neurological: She is alert and oriented to person, place, and time. She has normal reflexes.  Skin: Skin is warm and dry.  Psychiatric: She has a normal mood and affect. Her speech is normal and behavior is normal. Judgment and thought content normal.      BP 119/69 (BP Location: Left Arm, Patient Position: Sitting, Cuff Size: Large)   Pulse 91   Temp 98.1 F (36.7 C) (Oral)   Resp 18   Ht 5' 4.5" (1.638 m)   Wt (!) 346 lb (156.9 kg)   LMP 06/02/2016   SpO2 97%   BMI 58.47 kg/m  Assessment & Plan:   1. Essential hypertension Blood pressure is at goal on current medication regimen. The patient is asked to make an attempt to improve diet and exercise patterns to aid in medical  management of this problem.  2. Metabolic syndrome Recommend a lowfat, low carbohydrate diet divided over 5-6 small meals, increase water intake to 6-8 glasses, and 150 minutes per week of cardiovascular exercise.   - TSH - Lipid Panel - COMPLETE METABOLIC PANEL WITH GFR - POCT A1C - Ambulatory referral to diabetic education  3. Chronic knee pain, unspecified laterality - Menthol, Topical Analgesic, (BIOFREEZE) 4 % GEL; Apply 1 each topically every 6 (six) hours as needed.  Dispense: 1 Tube; Refill: 2  4. Observed sleep apnea  - Split  night study; Future  5. Snoring  - Split night study; Future    6. Breast cancer screening  - MM Digital Screening; Future  7. Prediabetes  - metFORMIN (GLUCOPHAGE) 500 MG tablet; Take 1 tablet (500 mg total) by mouth daily with breakfast.  Dispense: 90 tablet; Refill: 1  8. Gastroesophageal reflux disease without esophagitis  - omeprazole (PRILOSEC) 20 MG capsule; Take 1 capsule (20 mg total) by mouth daily.  Dispense: 30 capsule; Refill: 0    RTC: 3 months for hypertension, obesity, prediabetes, and pap smear   Azadeh Hyder M, FNP     Last pap smear

## 2016-06-14 NOTE — Patient Instructions (Addendum)
Diet for Metabolic Syndrome Metabolic syndrome is a disorder that includes at least three of these conditions:  Abdominal obesity.  Too much sugar in your blood.  High blood pressure.  Higher than normal amount of fat (lipids) in your blood.  Lower than normal level of "good" cholesterol (HDL). Following a healthy diet can help to keep metabolic syndrome under control. It can also help to prevent the development of conditions that are associated with metabolic syndrome, such as diabetes, heart disease, and stroke. Along with exercise, a healthy diet:  Helps to improve the way that the body uses insulin.  Promotes weight loss. A common goal for people with this condition is to lose at least 7 to 10 percent of their starting weight. What do I need to know about this diet?  Use the glycemic index (GI) to plan your meals. The index tells you how quickly a food will raise your blood sugar. Choose foods that have low GI values. These foods take a longer time to raise blood sugar.  Keep track of how many calories you take in. Eating the right amount of calories will help your achieve a healthy weight.  You may want to follow a Mediterranean diet. This diet includes lots of vegetables, lean meats or fish, whole grains, fruits, and healthy oils and fats. What foods can I eat? Grains  Stone-ground whole wheat. Pumpernickel bread. Whole-grain bread, crackers, tortillas, cereal, and pasta. Unsweetened oatmeal.Bulgur.Barley.Quinoa.Brown rice or wild rice. Vegetables  Lettuce. Spinach. Peas. Beets. Cauliflower. Cabbage. Broccoli. Carrots. Tomatoes. Squash. Eggplant. Herbs. Peppers. Onions. Cucumbers. Brussels sprouts. Sweet potatoes. Yams. Beans. Lentils. Fruits  Berries. Apples. Oranges. Grapes. Mango. Pomegranate. Kiwi. Cherries. Meats and Other Protein Sources  Seafood and shellfish. Lean meats.Poultry. Tofu. Dairy  Low-fat or fat-free dairy products, such as milk, yogurt, and  cheese. Beverages  Water. Low-fat milk. Milk alternatives, like soy milk or almond milk. Real fruit juice. Condiments  Low-sugar or sugar-free ketchup, barbecue sauce, and mayonnaise. Mustard. Relish. Fats and Oils  Avocado. Canola or olive oil. Nuts and nut butters.Seeds. The items listed above may not be a complete list of recommended foods or beverages. Contact your dietitian for more options.  What foods are not recommended? Red meat. Palm oil and coconut oil. Processed foods. Fried foods. Alcohol. Sweetened drinks, such as iced tea and soda. Sweets. Salty foods. The items listed above may not be a complete list of foods and beverages to avoid. Contact your dietitian for more information.  This information is not intended to replace advice given to you by your health care provider. Make sure you discuss any questions you have with your health care provider. Document Released: 09/09/2014 Document Revised: 09/04/2015 Document Reviewed: 05/07/2014 [employer] Interactive Patient Education  2017 [employer]  Metabolic Syndrome Introduction Metabolic syndrome is the presence of at least three factors that increase your risk of getting cardiovascular disease and diabetes. These factors are:  High blood sugar.  High blood triglyceride level.  High blood pressure.  Low levels of good blood cholesterol (high-density lipoprotein or HDL).  Excess weight around the waist. This factor is present with a waist measurement of:  More than 40 inches in men.  More than 35 inches in women. Metabolic syndrome is sometimes called insulin resistance syndrome and syndrome X. What are the causes? The exact cause is not known, but genetics and lifestyle choices play a role. What increases the risk? You are more likely to develop metabolic syndrome if:  You eat a diet high  in calories and saturated fat.  You do not exercise regularly.  You are overweight.  You have a family history of metabolic  syndrome.  You are Asian.  You are older in age.  You have insulin resistance.  You use any tobacco products, including cigarettes, chewing tobacco, or electronic cigarettes. What are the signs or symptoms? Metabolic syndrome has no specific symptoms. How is this diagnosed? To make a diagnosis, your health care provider will determine whether you have at least three of the factors that make up metabolic syndrome by:  Taking your blood pressure.  Measuring your waist.  Ordering blood tests. How is this treated? Treatment may include:  Lifestyle changes to reduce your risk for heart disease and stroke, such as:  Exercise.  Weight loss.  Maintaining a healthy diet.  Quitting the use of any tobacco products, including cigarettes, chewing tobacco, or electronic cigarettes.  Medicines that:  Help your body to maintain glucose control.  Reduce your blood pressure and your blood triglyceride levels. Follow these instructions at home:  Exercise regularly.  Maintain a healthy diet.  Do not use any tobacco products, including cigarettes, chewing tobacco, or electronic cigarettes. If you need help quitting, ask your health care provider.  Keep all follow-up visits as directed by your health care provider. This is important.  Measure your waist regularly and record the measurement. To measure your waist:  Stand up straight.  Breathe out.  Wrap the measuring tape around the part of your waist that is just above your hipbones.  Read the measurement. Contact a health care provider if:  You feel very tired.  You develop excessive thirst.  You pass large quantities of urine.  You put on weight around your waist.  You have headaches over and over again.  You have a dizzy spell. Get help right away if:  You develop sudden blurred vision.  You develop a sudden dizzy spell.  You have sudden trouble speaking or swallowing.  You have sudden weakness in your arm or  leg.  You have chest pains or trouble breathing.  You feel like your heartbeat is abnormal.  You faint. This information is not intended to replace advice given to you by your health care provider. Make sure you discuss any questions you have with your health care provider. Document Released: 08/02/2007 Document Revised: 10/01/2015 Document Reviewed: 11/29/2013  2017 [employer]

## 2016-06-15 MED ORDER — METFORMIN HCL 500 MG PO TABS: 500.0000 mg | ORAL_TABLET | Freq: Every day | ORAL | 1 refills | Status: DC

## 2016-06-15 MED ORDER — OMEPRAZOLE 20 MG PO CPDR: 20.0000 mg | DELAYED_RELEASE_CAPSULE | Freq: Every day | ORAL | 0 refills | Status: DC

## 2016-06-28 ENCOUNTER — Telehealth: Payer: Self-pay

## 2016-06-28 NOTE — Telephone Encounter (Signed)
Patient is asking if she can be sent to Dermatology for a mole/skin tag? Please advise. Thanks!

## 2016-07-07 ENCOUNTER — Ambulatory Visit: Payer: Self-pay | Admitting: Registered"

## 2016-08-01 ENCOUNTER — Ambulatory Visit (HOSPITAL_BASED_OUTPATIENT_CLINIC_OR_DEPARTMENT_OTHER): Payer: BLUE CROSS/BLUE SHIELD | Attending: Family Medicine | Admitting: Internal Medicine

## 2016-08-01 DIAGNOSIS — G4733 Obstructive sleep apnea (adult) (pediatric): Secondary | ICD-10-CM | POA: Diagnosis present

## 2016-08-01 DIAGNOSIS — G4730 Sleep apnea, unspecified: Secondary | ICD-10-CM

## 2016-08-02 ENCOUNTER — Ambulatory Visit
Admission: RE | Admit: 2016-08-02 | Discharge: 2016-08-02 | Disposition: A | Discharge status: Home / Self Care | Payer: BLUE CROSS/BLUE SHIELD | Source: Ambulatory Visit | Attending: Family Medicine | Admitting: Family Medicine

## 2016-08-02 DIAGNOSIS — Z1239 Encounter for other screening for malignant neoplasm of breast: Secondary | ICD-10-CM

## 2016-08-04 ENCOUNTER — Encounter: Payer: Self-pay | Admitting: Family Medicine

## 2016-08-04 ENCOUNTER — Ambulatory Visit (HOSPITAL_COMMUNITY)
Admission: RE | Admit: 2016-08-04 | Discharge: 2016-08-04 | Disposition: A | Discharge status: Home / Self Care | Payer: BLUE CROSS/BLUE SHIELD | Source: Ambulatory Visit | Attending: Family Medicine | Admitting: Family Medicine

## 2016-08-04 ENCOUNTER — Ambulatory Visit (INDEPENDENT_AMBULATORY_CARE_PROVIDER_SITE_OTHER): Payer: BLUE CROSS/BLUE SHIELD | Admitting: Family Medicine

## 2016-08-04 VITALS — BP 127/74 | HR 94 | Temp 98.4°F | Ht 64.5 in | Wt 345.0 lb

## 2016-08-04 DIAGNOSIS — R0609 Other forms of dyspnea: Secondary | ICD-10-CM

## 2016-08-04 DIAGNOSIS — Z23 Encounter for immunization: Secondary | ICD-10-CM | POA: Diagnosis not present

## 2016-08-04 DIAGNOSIS — R600 Localized edema: Secondary | ICD-10-CM | POA: Diagnosis not present

## 2016-08-04 DIAGNOSIS — D508 Other iron deficiency anemias: Secondary | ICD-10-CM

## 2016-08-04 DIAGNOSIS — R079 Chest pain, unspecified: Secondary | ICD-10-CM

## 2016-08-04 DIAGNOSIS — R0600 Dyspnea, unspecified: Secondary | ICD-10-CM

## 2016-08-04 DIAGNOSIS — Z6841 Body Mass Index (BMI) 40.0 and over, adult: Secondary | ICD-10-CM

## 2016-08-04 LAB — CBC WITH DIFFERENTIAL/PLATELET
BASOS ABS: 0 {cells}/uL (ref 0–200)
Basophils Relative: 0 %
EOS ABS: 273 {cells}/uL (ref 15–500)
Eosinophils Relative: 3 %
HEMATOCRIT: 36.1 % (ref 35.0–45.0)
HEMOGLOBIN: 11.2 g/dL — AB (ref 11.7–15.5)
LYMPHS ABS: 2639 {cells}/uL (ref 850–3900)
LYMPHS PCT: 29 %
MCH: 27.1 pg (ref 27.0–33.0)
MCHC: 31 g/dL — AB (ref 32.0–36.0)
MCV: 87.4 fL (ref 80.0–100.0)
MONO ABS: 455 {cells}/uL (ref 200–950)
MPV: 9.9 fL (ref 7.5–12.5)
Monocytes Relative: 5 %
NEUTROS PCT: 63 %
Neutro Abs: 5733 cells/uL (ref 1500–7800)
PLATELETS: 316 10*3/uL (ref 140–400)
RBC: 4.13 MIL/uL (ref 3.80–5.10)
RDW: 18.5 % — ABNORMAL HIGH (ref 11.0–15.0)
WBC: 9.1 10*3/uL (ref 3.8–10.8)

## 2016-08-04 NOTE — Patient Instructions (Addendum)
Nonspecific Chest Pain Chest pain can be caused by many different conditions. There is a chance that your pain could be related to something serious, such as a heart attack or a blood clot in your lungs. Chest pain can also be caused by conditions that are not life-threatening. If you have chest pain, it is very important to follow up with your doctor. Follow these instructions at home: Medicines   If you were prescribed an antibiotic medicine, take it as told by your doctor. Do not stop taking the antibiotic even if you start to feel better.  Take over-the-counter and prescription medicines only as told by your doctor. Lifestyle   Do not use any products that contain nicotine or tobacco, such as cigarettes and e-cigarettes. If you need help quitting, ask your doctor.  Do not drink alcohol.  Make lifestyle changes as told by your doctor. These may include:  Getting regular exercise. Ask your doctor for some activities that are safe for you.  Eating a heart-healthy diet. A diet specialist (dietitian) can help you to learn healthy eating options.  Staying at a healthy weight.  Managing diabetes, if needed.  Lowering your stress, as with deep breathing or spending time in nature. General instructions   Avoid any activities that make you feel chest pain.  If your chest pain is because of heartburn:  Raise (elevate) the head of your bed about 6 inches (15 cm). You can do this by putting blocks under the bed legs at the head of the bed.  Do not sleep with extra pillows under your head. That does not help heartburn.  Keep all follow-up visits as told by your doctor. This is important. This includes any further testing if your chest pain does not go away. Contact a doctor if:  Your chest pain does not go away.  You have a rash with blisters on your chest.  You have a fever.  You have chills. Get help right away if:  Your chest pain is worse.  You have a cough that gets worse,  or you cough up blood.  You have very bad (severe) pain in your belly (abdomen).  You are very weak.  You pass out (faint).  You have either of these for no clear reason:  Sudden chest discomfort.  Sudden discomfort in your arms, back, neck, or jaw.  You have shortness of breath at any time.  You suddenly start to sweat, or your skin gets clammy.  You feel sick to your stomach (nauseous).  You throw up (vomit).  You suddenly feel light-headed or dizzy.  Your heart starts to beat fast, or it feels like it is skipping beats. These symptoms may be an emergency. Do not wait to see if the symptoms will go away. Get medical help right away. Call your local emergency services (911 in the [place] Do not drive yourself to the hospital. This information is not intended to replace advice given to you by your health care provider. Make sure you discuss any questions you have with your health care provider. Document Released: 10/12/2007 Document Revised: 01/18/2016 Document Reviewed: 01/18/2016 Elsevier Interactive Patient Education  2017 [employer].  Shortness of Breath, Adult Shortness of breath means you have trouble breathing. Your lungs are organs for breathing. Follow these instructions at home: Pay attention to any changes in your symptoms. Take these actions to help with your condition:  Do not smoke. Smoking can cause shortness of breath. If you need help to quit smoking,  ask your doctor.  Avoid things that can make it harder to breathe, such as:  Mold.  Dust.  Air pollution.  Chemical smells.  Things that can cause allergy symptoms (allergens), if you have allergies.  Keep your living space clean and free of mold and dust.  Rest as needed. Slowly return to your usual activities.  Take over-the-counter and prescription medicines, including oxygen and inhaled medicines, only as told by your doctor.  Keep all follow-up visits as told by your doctor. This is  important. Contact a doctor if:  Your condition does not get better as soon as expected.  You have a hard time doing your normal activities, even after you rest.  You have new symptoms. Get help right away if:  You have trouble breathing when you are resting.  You feel light-headed or you faint.  You have a cough that is not helped by medicines.  You cough up blood.  You have pain with breathing.  You have pain in your chest, arms, shoulders, or belly (abdomen).  You have a fever.  You cannot walk up stairs.  You cannot exercise the way you normally do. This information is not intended to replace advice given to you by your health care provider. Make sure you discuss any questions you have with your health care provider. Document Released: 10/12/2007 Document Revised: 05/12/2016 Document Reviewed: 05/12/2016 Elsevier Interactive Patient Education  2017 [employer] Inc[employer]  Obesity, Adult Obesity is the condition of having too much total body fat. Being overweight or obese means that your weight is greater than what is considered healthy for your body size. Obesity is determined by a measurement called BMI. BMI is an estimate of body fat and is calculated from height and weight. For adults, a BMI of 30 or higher is considered obese. Obesity can eventually lead to other health concerns and major illnesses, including:  Stroke.  Coronary artery disease (CAD).  Type 2 diabetes.  Some types of cancer, including cancers of the colon, breast, uterus, and gallbladder.  Osteoarthritis.  High blood pressure (hypertension).  High cholesterol.  Sleep apnea.  Gallbladder stones.  Infertility problems. What are the causes? The main cause of obesity is taking in (consuming) more calories than your body uses for energy. Other factors that contribute to this condition may include:  Being born with genes that make you more likely to become obese.  Having a medical condition that  causes obesity. These conditions include:  Hypothyroidism.  Polycystic ovarian syndrome (PCOS).  Binge-eating disorder.  Cushing syndrome.  Taking certain medicines, such as steroids, antidepressants, and seizure medicines.  Not being physically active (sedentary lifestyle).  Living where there are limited places to exercise safely or buy healthy foods.  Not getting enough sleep. What increases the risk? The following factors may increase your risk of this condition:  Having a family history of obesity.  Being a woman of African-American descent.  Being a man of Hispanic descent. What are the signs or symptoms? Having excessive body fat is the main symptom of this condition. How is this diagnosed? This condition may be diagnosed based on:  Your symptoms.  Your medical history.  A physical exam. Your health care provider may measure:  Your BMI. If you are an adult with a BMI between 25 and less than 30, you are considered overweight. If you are an adult with a BMI of 30 or higher, you are considered obese.  The distances around your hips and your waist (circumferences). These  may be compared to each other to help diagnose your condition.  Your skinfold thickness. Your health care provider may gently pinch a fold of your skin and measure it. How is this treated? Treatment for this condition often includes changing your lifestyle. Treatment may include some or all of the following:  Dietary changes. Work with your health care provider and a dietitian to set a weight-loss goal that is healthy and reasonable for you. Dietary changes may include eating:  Smaller portions. A portion size is the amount of a particular food that is healthy for you to eat at one time. This varies from person to person.  Low-calorie or low-fat options.  More whole grains, fruits, and vegetables.  Regular physical activity. This may include aerobic activity (cardio) and strength  training.  Medicine to help you lose weight. Your health care provider may prescribe medicine if you are unable to lose 1 pound a week after 6 weeks of eating more healthily and doing more physical activity.  Surgery. Surgical options may include gastric banding and gastric bypass. Surgery may be done if:  Other treatments have not helped to improve your condition.  You have a BMI of 40 or higher.  You have life-threatening health problems related to obesity. Follow these instructions at home:   Eating and drinking    Follow recommendations from your health care provider about what you eat and drink. Your health care provider may advise you to:  Limit fast foods, sweets, and processed snack foods.  Choose low-fat options, such as low-fat milk instead of whole milk.  Eat 5 or more servings of fruits or vegetables every day.  Eat at home more often. This gives you more control over what you eat.  Choose healthy foods when you eat out.  Learn what a healthy portion size is.  Keep low-fat snacks on hand.  Avoid sugary drinks, such as soda, fruit juice, iced tea sweetened with sugar, and flavored milk.  Eat a healthy breakfast.  Drink enough water to keep your urine clear or pale yellow.  Do not go without eating for long periods of time (do not fast) or follow a fad diet. Fasting and fad diets can be unhealthy and even dangerous. Physical Activity   Exercise regularly, as told by your health care provider. Ask your health care provider what types of exercise are safe for you and how often you should exercise.  Warm up and stretch before being active.  Cool down and stretch after being active.  Rest between periods of activity. Lifestyle   Limit the time that you spend in front of your TV, computer, or video game system.  Find ways to reward yourself that do not involve food.  Limit alcohol intake to no more than 1 drink a day for nonpregnant women and 2 drinks a day  for men. One drink equals 12 oz of beer, 5 oz of wine, or 1 oz of hard liquor. General instructions   Keep a weight loss journal to keep track of the food you eat and how much you exercise you get.  Take over-the-counter and prescription medicines only as told by your health care provider.  Take vitamins and supplements only as told by your health care provider.  Consider joining a support group. Your health care provider may be able to recommend a support group.  Keep all follow-up visits as told by your health care provider. This is important. Contact a health care provider if:  You are  unable to meet your weight loss goal after 6 weeks of dietary and lifestyle changes. This information is not intended to replace advice given to you by your health care provider. Make sure you discuss any questions you have with your health care provider. Document Released: 06/02/2004 Document Revised: 09/28/2015 Document Reviewed: 02/11/2015 Elsevier Interactive Patient Education  2017 Else[employer] Inc.  Preventing Unhealthy [employer], Adult Staying at a healthy weight is important. When fat builds up in your body, you may become overweight or obese. These conditions put you at greater risk for developing certain health problems, such as heart disease, diabetes, sleeping problems, joint problems, and some cancers. Unhealthy weight gain is often the result of making unhealthy choices in what you eat. It is also a result of not getting enough exercise. You can make changes to your lifestyle to prevent obesity and stay as healthy as possible. What nutrition changes can be made? To maintain a healthy weight and prevent obesity:  Eat only as much as your body needs. To do this:  Pay attention to signs that you are hungry or full. Stop eating as soon as you feel full.  If you feel hungry, try drinking water first. Drink enough water so your urine is clear or pale yellow.  Eat smaller portions.  Look at  serving sizes on food labels. Most foods contain more than one serving per container.  Eat the recommended amount of calories for your gender and activity level. While most active people should eat around 2,000 calories per day, if you are trying to lose weight or are not very active, you main need to eat less calories. Talk to your health care provider or dietitian about how many calories you should eat each day.  Choose healthy foods, such as:  Fruits and vegetables. Try to fill at least half of your plate at each meal with fruits and vegetables.  Whole grains, such as whole wheat bread, brown rice, and quinoa.  Lean meats, such as chicken or fish.  Other healthy proteins, such as beans, eggs, or tofu.  Healthy fats, such as nuts, seeds, fatty fish, and olive oil.  Low-fat or fat-free dairy.  Check food labels and avoid food and drinks that:  Are high in calories.  Have added sugar.  Are high in sodium.  Have saturated fats or trans fats.  Limit how much you eat of the following foods:  Prepackaged meals.  Fast food.  Fried foods.  Processed meat, such as bacon, sausage, and deli meats.  Fatty cuts of red meat and poultry with skin.  Cook foods in healthier ways, such as by baking, broiling, or grilling.  When grocery shopping, try to shop around the outside of the store. This helps you buy mostly fresh foods and avoid canned and prepackaged foods. What lifestyle changes can be made?  Exercise at least 30 minutes 5 or more days each week. Exercising includes brisk walking, yard work, biking, running, swimming, and team sports like basketball and soccer. Ask your health care provider which exercises are safe for you.  Do not use any products that contain nicotine or tobacco, such as cigarettes and e-cigarettes. If you need help quitting, ask your health care provider.  Limit alcohol intake to no more than 1 drink a day for nonpregnant women and 2 drinks a day for  men. One drink equals 12 oz of beer, 5 oz of wine, or 1 oz of hard liquor.  Try to get 7-9 hours of sleep  each night. What other changes can be made?  Keep a food and activity journal to keep track of:  What you ate and how many calories you had. Remember to count sauces, dressings, and side dishes.  Whether you were active, and what exercises you did.  Your calorie, weight, and activity goals.  Check your weight regularly. Track any changes. If you notice you have gained weight, make changes to your diet or activity routine.  Avoid taking weight-loss medicines or supplements. Talk to your health care provider before starting any new medicine or supplement.  Talk to your health care provider before trying any new diet or exercise plan. Why are these changes important? Eating healthy, staying active, and having healthy habits not only help prevent obesity, they also:  Help you to manage stress and emotions.  Help you to connect with friends and family.  Improve your self-esteem.  Improve your sleep.  Prevent long-term health problems. What can happen if changes are not made? Being obese or overweight can cause you to develop joint or bone problems, which can make it hard for you to stay active or do activities you enjoy. Being obese or overweight also puts stress on your heart and lungs and can lead to health problems like diabetes, heart disease, and some cancers. Where to find more information: Talk with your health care provider or a dietitian about healthy eating and healthy lifestyle choices. You may also find other information through these resources:  [place] Department of Agriculture MyPlate: https://ball-collins.biz/  American Heart Association: www.heart.org  Centers for Disease Control and Prevention: [employer].br Summary  Staying at a healthy weight is important. It helps prevent certain diseases and health problems, such as heart disease, diabetes, joint problems, sleep  disorders, and some cancers.  Being obese or overweight can cause you to develop joint or bone problems, which can make it hard for you to stay active or do activities you enjoy.  You can prevent unhealthy weight gain by eating a healthy diet, exercising regularly, not smoking, limiting alcohol, and getting enough sleep.  Talk with your health care provider or a dietitian for guidance about healthy eating and healthy lifestyle choices. This information is not intended to replace advice given to you by your health care provider. Make sure you discuss any questions you have with your health care provider. Document Released: 04/26/2016 Document Revised: 04/26/2016 Document Reviewed: 04/26/2016 Elsevier Interactive Patient Education  2017 [employer] Inc.  Prediabetes Prediabetes is the condition of having a blood sugar (blood glucose) level that is higher than it should be, but not high enough for you to be diagnosed with type 2 diabetes. Having prediabetes puts you at risk for developing type 2 diabetes (type 2 diabetes mellitus). Prediabetes may be called impaired glucose tolerance or impaired fasting glucose. Prediabetes usually does not cause symptoms. Your health care provider can diagnose this condition with blood tests. You may be tested for prediabetes if you are overweight and if you have at least one other risk factor for prediabetes. Risk factors for prediabetes include:  Having a family member with type 2 diabetes.  Being overweight or obese.  Being older than age 80.  Being of American-Indian, African-American, Hispanic/Latino, or Asian/Pacific Islander descent.  Having an inactive (sedentary) lifestyle.  Having a history of gestational diabetes or polycystic ovarian syndrome (PCOS).  Having low levels of good cholesterol (HDL-C) or high levels of blood fats (triglycerides).  Having high blood pressure. What is blood glucose and how is blood glucose  measured?   Blood glucose  refers to the amount of glucose in your bloodstream. Glucose comes from eating foods that contain sugars and starches (carbohydrates) that the body breaks down into glucose. Your blood glucose level may be measured in mg/dL (milligrams per deciliter) or mmol/L (millimoles per liter).Your blood glucose may be checked with one or more of the following blood tests:  A fasting blood glucose (FBG) test. You will not be allowed to eat (you will fast) for at least 8 hours before a blood sample is taken.  A normal range for FBG is 70-100 mg/dl (1.6-1.0 mmol/L).  An A1c (hemoglobin A1c) blood test. This test provides information about blood glucose control over the previous 2?3months.  An oral glucose tolerance test (OGTT). This test measures your blood glucose twice:  After fasting. This is your baseline level.  Two hours after you drink a beverage that contains glucose. You may be diagnosed with prediabetes:  If your FBG is 100?125 mg/dL (9.6-0.4 mmol/L).  If your A1c level is 5.7?6.4%.  If your OGGT result is 140?199 mg/dL (5.4-09 mmol/L). These blood tests may be repeated to confirm your diagnosis. What happens if blood glucose is too high? The pancreas produces a hormone (insulin) that helps move glucose from the bloodstream into cells. When cells in the body do not respond properly to insulin that the body makes (insulin resistance), excess glucose builds up in the blood instead of going into cells. As a result, high blood glucose (hyperglycemia) can develop, which can cause many complications. This is a symptom of prediabetes. What can happen if blood glucose stays higher than normal for a long time? Having high blood glucose for a long time is dangerous. Too much glucose in your blood can damage your nerves and blood vessels. Long-term damage can lead to complications from diabetes, which may include:  Heart disease.  Stroke.  Blindness.  Kidney disease.  Depression.  Poor  circulation in the feet and legs, which could lead to surgical removal (amputation) in severe cases. How can prediabetes be prevented from turning into type 2 diabetes?   To help prevent type 2 diabetes, take the following actions:  Be physically active.  Do moderate-intensity physical activity for at least 30 minutes on at least 5 days of the week, or as much as told by your health care provider. This could be brisk walking, biking, or water aerobics.  Ask your health care provider what activities are safe for you. A mix of physical activities may be best, such as walking, swimming, cycling, and strength training.  Lose weight as told by your health care provider.  Losing 5-7% of your body weight can reverse insulin resistance.  Your health care provider can determine how much weight loss is best for you and can help you lose weight safely.  Follow a healthy meal plan. This includes eating lean proteins, complex carbohydrates, fresh fruits and vegetables, low-fat dairy products, and healthy fats.  Follow instructions from your health care provider about eating or drinking restrictions.  Make an appointment to see a diet and nutrition specialist (registered dietitian) to help you create a healthy eating plan that is right for you.  Do not smoke or use any tobacco products, such as cigarettes, chewing tobacco, and e-cigarettes. If you need help quitting, ask your health care provider.  Take over-the-counter and prescription medicines as told by your health care provider. You may be prescribed medicines that help lower the risk of type 2 diabetes. This information  is not intended to replace advice given to you by your health care provider. Make sure you discuss any questions you have with your health care provider. Document Released: 08/17/2015 Document Revised: 10/01/2015 Document Reviewed: 06/16/2015 Elsevier Interactive Patient Education  2017 [employer]

## 2016-08-04 NOTE — Progress Notes (Signed)
Subjective:    Patient ID: Christie Walker, female    DOB: 1970/10/04, 46 y.o.   MRN: [id number]  Shortness of Breath  This is a chronic problem. The current episode started more than 1 year ago. The problem occurs constantly. Associated symptoms include chest pain and leg swelling. Pertinent negatives include no abdominal pain, claudication, coryza, ear pain, fever, hemoptysis, leg pain, orthopnea, PND, rash, rhinorrhea, sore throat, sputum production, swollen glands, syncope, vomiting or wheezing. Nothing aggravates the symptoms. She has tried nothing for the symptoms. There is no history of allergies, aspirin allergies, asthma, CAD, chronic lung disease, COPD, a heart failure, PE, pneumonia or a recent surgery.  Chest Pain   This is a recurrent problem. The onset quality is sudden. The pain is at a severity of 2/10. The pain is mild. The quality of the pain is described as tightness. Associated symptoms include shortness of breath. Pertinent negatives include no abdominal pain, claudication, fever, hemoptysis, leg pain, orthopnea, PND, sputum production, syncope or vomiting. The pain is aggravated by nothing. She has tried nothing for the symptoms.  Pertinent negatives for past medical history include no CAD and no PE.    Past Medical History:  Diagnosis Date  . Acid reflux   . Bipolar 1 disorder (HCC)   . Essential hypertension 06/14/2016   Social History   Social History  . Marital status: Legally Separated    Spouse name: N/A  . Number of children: N/A  . Years of education: N/A   Occupational History  . Not on file.   Social History Main Topics  . Smoking status: Never Smoker  . Smokeless tobacco: Never Used  . Alcohol use No  . Drug use: No  . Sexual activity: Not on file   Other Topics Concern  . Not on file   Social History Narrative  . No narrative on file    Review of Systems  Constitutional: Negative for fever.  HENT: Negative for ear pain, rhinorrhea  and sore throat.   Respiratory: Positive for shortness of breath. Negative for hemoptysis, sputum production and wheezing.   Cardiovascular: Positive for chest pain and leg swelling. Negative for orthopnea, claudication, syncope and PND.  Gastrointestinal: Negative for abdominal pain and vomiting.  Skin: Negative for rash.      Objective:   Physical Exam  Constitutional: She is oriented to person, place, and time.  HENT:  Head: Normocephalic and atraumatic.  Right Ear: External ear normal.  Left Ear: External ear normal.  Nose: Nose normal.  Mouth/Throat: Oropharynx is clear and moist.  Eyes: Conjunctivae and EOM are normal. Pupils are equal, round, and reactive to light.  Neck: Normal range of motion. Neck supple.  Cardiovascular: Normal rate, regular rhythm, normal heart sounds and intact distal pulses.   Bilateral lower extremity edema 2+  Pulmonary/Chest: Effort normal and breath sounds normal. No respiratory distress. She has no wheezes. She has no rales. She exhibits no tenderness.  Abdominal: Soft. Bowel sounds are normal.  Musculoskeletal: Normal range of motion.  Neurological: She is alert and oriented to person, place, and time. She has normal reflexes.  Skin: Skin is warm.  Psychiatric: She has a normal mood and affect. Her behavior is normal. Thought content normal.      BP 127/74 (BP Location: Right Arm, Patient Position: Sitting, Cuff Size: Large)   Pulse 94   Temp 98.4 F (36.9 C) (Oral)   Ht 5' 4.5" (1.638 m)   Wt (!) 345 lb (156.5  kg)   LMP 07/06/2016   SpO2 97%   BMI 58.30 kg/m  Assessment & Plan:  1. Chest pain at rest EKG normal sinus rhythm. I suspect that weight is a contributing factor to recent chest pains/palpitations. Will review labs and follow up by phone.  - EKG 12-Lead - Brain natriuretic peptide - DG Chest 2 View; Future  2. Morbid obesity with BMI of 50.0-59.9, adult (HCC) Recommend a carbohydrate modified, low fat diet as discussed.  Previously sent referral to diabetes and nutrition education.  3. Dyspnea on exertion - EKG 12-Lead - DG Chest 2 View; Future  4. Need for diphtheria-tetanus-pertussis (Tdap) vaccine  - Tdap vaccine greater than or equal to 7yo IM  5. Lower extremity edema -Brain natriuretic peptide Continue hydrochlorothiazide as previously prescribed.   6. Other iron deficiency anemia - CBC with Differential    RTC: Will follow up with patient by phone with laboratory results   Nolon Nations  MSN, FNP-C [facility] [address] [place], [place] [address] [phone]

## 2016-08-05 DIAGNOSIS — G4730 Sleep apnea, unspecified: Secondary | ICD-10-CM

## 2016-08-05 DIAGNOSIS — Z6841 Body Mass Index (BMI) 40.0 and over, adult: Secondary | ICD-10-CM

## 2016-08-05 DIAGNOSIS — R079 Chest pain, unspecified: Secondary | ICD-10-CM | POA: Insufficient documentation

## 2016-08-05 DIAGNOSIS — R0600 Dyspnea, unspecified: Secondary | ICD-10-CM | POA: Insufficient documentation

## 2016-08-05 DIAGNOSIS — R0609 Other forms of dyspnea: Secondary | ICD-10-CM | POA: Insufficient documentation

## 2016-08-05 LAB — BRAIN NATRIURETIC PEPTIDE: Brain Natriuretic Peptide: <4 pg/mL (ref <100)

## 2016-08-05 NOTE — Procedures (Signed)
  Patient Name: Christie Walker, Christie Walker Date: 08/01/2016 Gender: Female D.O.B: May 06, 1971 Age (years): 45 Referring Provider: Julianne Handler Height (inches): 65 Interpreting Physician: Jetty Duhamel MD, ABSM Weight (lbs): 346 RPSGT: Melburn Popper BMI: 58 MRN: [id number] Neck Size: 18.00 CLINICAL INFORMATION Sleep Study Type: NPSG  Indication for sleep study: Obesity, OSA, Snoring  Epworth Sleepiness Score: 14  SLEEP STUDY TECHNIQUE As per the AASM Manual for the Scoring of Sleep and Associated Events v2.3 (April 2016) with a hypopnea requiring 4% desaturations.  The channels recorded and monitored were frontal, central and occipital EEG, electrooculogram (EOG), submentalis EMG (chin), nasal and oral airflow, thoracic and abdominal wall motion, anterior tibialis EMG, snore microphone, electrocardiogram, and pulse oximetry.  MEDICATIONS Medications self-administered by patient taken the night of the study : none reported  SLEEP ARCHITECTURE The study was initiated at 9:32:24 PM and ended at 4:27:40 AM.  Sleep onset time was 7.9 minutes and the sleep efficiency was 76.7%. The total sleep time was 318.4 minutes.  Stage REM latency was 374.0 minutes.  The patient spent 14.61% of the night in stage N1 sleep, 78.33% in stage N2 sleep, 2.04% in stage N3 and 5.03% in REM.  Alpha intrusion was absent.  Supine sleep was 12.41%.  RESPIRATORY PARAMETERS The overall apnea/hypopnea index (AHI) was 5.3 per hour. There were 1 total apneas, including 1 obstructive, 0 central and 0 mixed apneas. There were 27 hypopneas and 28 RERAs.  The AHI during Stage REM sleep was 56.3 per hour.  AHI while supine was 6.1 per hour.  The mean oxygen saturation was 92.01%. The minimum SpO2 during sleep was 84.00%.  Loud snoring was noted during this study.  CARDIAC DATA The 2 lead EKG demonstrated sinus rhythm. The mean heart rate was 79.17 beats per minute. Other EKG findings include:  None.  LEG MOVEMENT DATA The total PLMS were 0 with a resulting PLMS index of 0.00. Associated arousal with leg movement index was 0.0 .  IMPRESSIONS - Minimal obstructive sleep apnea occurred during this study (AHI = 5.3/h). - No significant central sleep apnea occurred during this study (CAI = 0.0/h). - Mild oxygen desaturation was noted during this study (Min O2 = 84.00%). - The patient snored with Loud snoring volume. - No cardiac abnormalities were noted during this study. - Clinically significant periodic limb movements did not occur during sleep. No significant associated arousals.  DIAGNOSIS - Obstructive Sleep Apnea (327.23 [G47.33 ICD-10]) - Nocturnal Hypoxemia (327.26 [G47.36 ICD-10])  RECOMMENDATIONS - Very mild obstructive sleep apnea. Return to provider to discuss treatment options. Consider emphasis on sleep position, weight loss, chin strap as appropriate. - Be careful with alcohol, sedatives and other CNS depressants that may worsen sleep apnea and disrupt normal sleep architecture. - Sleep hygiene should be reviewed to assess factors that may improve sleep quality. - Weight management and regular exercise should be initiated or continued if appropriate. - Consider clinical significance of third-shift work with potential for shift work / circadian rhythm sleep disorder.  [Electronically signed] 08/05/2016 11:26 AM  Jetty Duhamel MD, ABSM Diplomate, American Board of Sleep Medicine   NPI: [id number]  Waymon Budge Diplomate, American Board of Sleep Medicine  ELECTRONICALLY SIGNED ON:  08/05/2016, 11:25 AM [facility] PH: [phone]   FX: [phone] ACCREDITED BY THE AMERICAN ACADEMY OF SLEEP MEDICINE

## 2016-09-15 ENCOUNTER — Encounter: Payer: Self-pay | Admitting: Family Medicine

## 2016-09-15 ENCOUNTER — Other Ambulatory Visit (HOSPITAL_COMMUNITY)
Admission: RE | Admit: 2016-09-15 | Discharge: 2016-09-15 | Disposition: A | Discharge status: Home / Self Care | Payer: BLUE CROSS/BLUE SHIELD | Source: Ambulatory Visit | Attending: Family Medicine | Admitting: Family Medicine

## 2016-09-15 ENCOUNTER — Telehealth: Payer: Self-pay

## 2016-09-15 ENCOUNTER — Ambulatory Visit (INDEPENDENT_AMBULATORY_CARE_PROVIDER_SITE_OTHER): Payer: BLUE CROSS/BLUE SHIELD | Admitting: Family Medicine

## 2016-09-15 VITALS — BP 107/51 | HR 87 | Temp 98.4°F | Resp 16 | Ht 64.5 in | Wt 351.0 lb

## 2016-09-15 DIAGNOSIS — Z01419 Encounter for gynecological examination (general) (routine) without abnormal findings: Secondary | ICD-10-CM | POA: Diagnosis not present

## 2016-09-15 DIAGNOSIS — R0600 Dyspnea, unspecified: Secondary | ICD-10-CM

## 2016-09-15 DIAGNOSIS — R0609 Other forms of dyspnea: Secondary | ICD-10-CM

## 2016-09-15 DIAGNOSIS — I10 Essential (primary) hypertension: Secondary | ICD-10-CM

## 2016-09-15 DIAGNOSIS — Z0000 Encounter for general adult medical examination without abnormal findings: Secondary | ICD-10-CM

## 2016-09-15 DIAGNOSIS — R600 Localized edema: Secondary | ICD-10-CM

## 2016-09-15 DIAGNOSIS — Z6841 Body Mass Index (BMI) 40.0 and over, adult: Secondary | ICD-10-CM

## 2016-09-15 DIAGNOSIS — R002 Palpitations: Secondary | ICD-10-CM

## 2016-09-15 LAB — POCT URINALYSIS DIP (DEVICE)
Bilirubin Urine: NEGATIVE
GLUCOSE, UA: NEGATIVE mg/dL
Hgb urine dipstick: NEGATIVE
Ketones, ur: NEGATIVE mg/dL
Leukocytes, UA: NEGATIVE
Nitrite: NEGATIVE
PROTEIN: NEGATIVE mg/dL
Specific Gravity, Urine: >=1.03 (ref 1.005–1.030)
UROBILINOGEN UA: 0.2 mg/dL (ref 0.0–1.0)
pH: 6 (ref 5.0–8.0)

## 2016-09-15 LAB — BASIC METABOLIC PANEL WITH GFR
BUN: 10 mg/dL (ref 7–25)
CALCIUM: 9.1 mg/dL (ref 8.6–10.2)
CO2: 25 mmol/L (ref 20–31)
Chloride: 105 mmol/L (ref 98–110)
Creat: 0.95 mg/dL (ref 0.50–1.10)
GFR, EST AFRICAN AMERICAN: 84 mL/min (ref >=60)
GFR, EST NON AFRICAN AMERICAN: 73 mL/min (ref >=60)
GLUCOSE: 91 mg/dL (ref 65–99)
Potassium: 4.4 mmol/L (ref 3.5–5.3)
Sodium: 139 mmol/L (ref 135–146)

## 2016-09-15 MED ORDER — FUROSEMIDE 20 MG PO TABS: 20.0000 mg | ORAL_TABLET | Freq: Every day | ORAL | 3 refills | Status: DC

## 2016-09-15 NOTE — Telephone Encounter (Signed)
Called and left message asking patient to return call.   When patient calls back I need to advise her of Echo [facility]. Patient will need to arrive 15 minutes early and NOP after midnight. Thanks!

## 2016-09-15 NOTE — Progress Notes (Signed)
Christie Walker, a 46 year old female with a history of hypertension and morbid obesity presents for a routine gynecological exam. Patient is also complaining of periodic dyspnea on exertion. [name] says that she is extremely shortness of breath for greater than 1 year.    Shortness of Breath  This is a chronic problem. The problem occurs intermittently. The problem has been gradually worsening (Patient recently had a sleep study and does not have sleep apnea). Associated symptoms include leg swelling. Pertinent negatives include no abdominal pain, chest pain, ear pain, fever, orthopnea, rhinorrhea, sore throat, sputum production or syncope. The symptoms are aggravated by exercise and any activity. The patient has no known risk factors for DVT/PE. Her past medical history is significant for allergies. There is no history of aspirin allergies, asthma, bronchiolitis, CAD, chronic lung disease, COPD, DVT, a heart failure, PE, pneumonia or a recent surgery.  Gynecologic Exam  The patient's pertinent negatives include no genital itching, genital lesions, genital odor, genital rash, missed menses, pelvic pain, vaginal bleeding or vaginal discharge. She is not pregnant. Pertinent negatives include no abdominal pain, anorexia, dysuria, fever, flank pain, frequency, hematuria, joint swelling, nausea, painful intercourse, sore throat or urgency. She is not sexually active (Has not been sexually active in 3 years). Her menstrual history has been irregular. There is no history of an abdominal surgery, a Cesarean section, an ectopic pregnancy, endometriosis, a gynecological surgery, menorrhagia, metrorrhagia, miscarriage, PID or an STD.    Past Medical History:  Diagnosis Date  . Acid reflux   . Bipolar 1 disorder (HCC)   . Essential hypertension 06/14/2016   Social History   Social History  . Marital status: Legally Separated    Spouse name: N/A  . Number of children: N/A  . Years of education: N/A    Occupational History  . Not on file.   Social History Main Topics  . Smoking status: Never Smoker  . Smokeless tobacco: Never Used  . Alcohol use No  . Drug use: No  . Sexual activity: Not on file   Other Topics Concern  . Not on file   Social History Narrative  . No narrative on file  Review of Systems  Constitutional: Negative.  Negative for fever, malaise/fatigue and weight loss.  HENT: Negative.  Negative for ear pain, rhinorrhea and sore throat.   Eyes: Negative.   Respiratory: Positive for shortness of breath. Negative for sputum production.   Cardiovascular: Positive for palpitations and leg swelling. Negative for chest pain, orthopnea and syncope.  Gastrointestinal: Negative.  Negative for abdominal pain, anorexia and nausea.  Genitourinary: Negative.  Negative for dysuria, flank pain, frequency, hematuria, menorrhagia, missed menses, pelvic pain, urgency and vaginal discharge.  Skin: Negative.   Neurological: Negative.   Endo/Heme/Allergies: Negative.   Psychiatric/Behavioral: Negative.    Physical Exam  Constitutional: She is oriented to person, place, and time and well-developed, well-nourished, and in no distress.  Morbid obesity  HENT:  Head: Normocephalic.  Right Ear: External ear normal.  Mouth/Throat: Oropharynx is clear and moist.  Eyes: Conjunctivae and EOM are normal. Pupils are equal, round, and reactive to light.  Neck: Normal range of motion. Neck supple.  Cardiovascular: Normal rate, regular rhythm, normal heart sounds and intact distal pulses.   Pulmonary/Chest: Effort normal and breath sounds normal.  Abdominal: Soft. Bowel sounds are normal.  Increased abdominal girth  Genitourinary: Rectum normal, uterus normal, cervix normal, right adnexa normal, left adnexa normal and vulva normal. Right adnexum displays no  tenderness. Left adnexum displays no tenderness. Vagina exhibits no exudate and no lesion. Thin and vaginal discharge found.   Musculoskeletal: Normal range of motion.  Neurological: She is alert and oriented to person, place, and time. Gait normal. GCS score is 15.  Skin: Skin is warm and dry.  Psychiatric: Mood, memory, affect and judgment normal.   BP (!) 107/51 (BP Location: Right Arm, Patient Position: Sitting, Cuff Size: Large)   Pulse 87   Temp 98.4 F (36.9 C) (Oral)   Resp 16   Ht 5' 4.5" (1.638 m)   Wt (!) 351 lb (159.2 kg)   SpO2 98%   BMI 59.32 kg/m   Plan   1. Essential hypertension - BASIC METABOLIC PANEL WITH GFR  2. Dyspnea on exertion Previous BNP negative. Patient is unable to tolerate exercise. She recently had a sleep study, patient had minimal obstructive sleep apnea.  - ECHOCARDIOGRAM COMPLETE; Future - BASIC METABOLIC PANEL WITH GFR  3. Morbid obesity with BMI of 50.0-59.9, adult [facility](HCC) Will send a referral to bariatric surgery for a consult.  Also, discussed [facility] for more information on weight loss surgery.   4. Bilateral lower extremity edema Will start a trial of furosemide.  - ECHOCARDIOGRAM COMPLETE; Future - BASIC METABOLIC PANEL WITH GFR - furosemide (LASIX) 20 MG tablet; Take 1 tablet (20 mg total) by mouth daily.  Dispense: 30 tablet; Refill: 3  5. Pap smear, as part of routine gynecological examination - Cytology - PAP [place]   RTC: 1 month for dyspnea and lower extremity evaluation   [name] Jupiter Kabir  MSN, FNP-C [facility] [address]  [place], [place] [id number] [phone]

## 2016-09-15 NOTE — Patient Instructions (Addendum)
Will start a trial of Furosemide 20 mg for bilateral lower extremity edema.  Will follow up in 1 month  Will schedule echocardiogram for further evaluation of dyspnea and lower extremity edema  Follow a DASH diet DASH Eating Plan DASH stands for "Dietary Approaches to Stop Hypertension." The DASH eating plan is a healthy eating plan that has been shown to reduce high blood pressure (hypertension). It may also reduce your risk for type 2 diabetes, heart disease, and stroke. The DASH eating plan may also help with weight loss. What are tips for following this plan? General guidelines   Avoid eating more than 2,300 mg (milligrams) of salt (sodium) a day. If you have hypertension, you may need to reduce your sodium intake to 1,500 mg a day.  Limit alcohol intake to no more than 1 drink a day for nonpregnant women and 2 drinks a day for men. One drink equals 12 oz of beer, 5 oz of wine, or 1 oz of hard liquor.  Work with your health care provider to maintain a healthy body weight or to lose weight. Ask what an ideal weight is for you.  Get at least 30 minutes of exercise that causes your heart to beat faster (aerobic exercise) most days of the week. Activities may include walking, swimming, or biking.  Work with your health care provider or diet and nutrition specialist (dietitian) to adjust your eating plan to your individual calorie needs. Reading food labels   Check food labels for the amount of sodium per serving. Choose foods with less than 5 percent of the Daily Value of sodium. Generally, foods with less than 300 mg of sodium per serving fit into this eating plan.  To find whole grains, look for the word "whole" as the first word in the ingredient list. Shopping   Buy products labeled as "low-sodium" or "no salt added."  Buy fresh foods. Avoid canned foods and premade or frozen meals. Cooking   Avoid adding salt when cooking. Use salt-free seasonings or herbs instead of table salt  or sea salt. Check with your health care provider or pharmacist before using salt substitutes.  Do not fry foods. Cook foods using healthy methods such as baking, boiling, grilling, and broiling instead.  Cook with heart-healthy oils, such as olive, canola, soybean, or sunflower oil. Meal planning    Eat a balanced diet that includes:  5 or more servings of fruits and vegetables each day. At each meal, try to fill half of your plate with fruits and vegetables.  Up to 6-8 servings of whole grains each day.  Less than 6 oz of lean meat, poultry, or fish each day. A 3-oz serving of meat is about the same size as a deck of cards. One egg equals 1 oz.  2 servings of low-fat dairy each day.  A serving of nuts, seeds, or beans 5 times each week.  Heart-healthy fats. Healthy fats called Omega-3 fatty acids are found in foods such as flaxseeds and coldwater fish, like sardines, salmon, and mackerel.  Limit how much you eat of the following:  Canned or prepackaged foods.  Food that is high in trans fat, such as fried foods.  Food that is high in saturated fat, such as fatty meat.  Sweets, desserts, sugary drinks, and other foods with added sugar.  Full-fat dairy products.  Do not salt foods before eating.  Try to eat at least 2 vegetarian meals each week.  Eat more home-cooked food and less restaurant,  buffet, and fast food.  When eating at a restaurant, ask that your food be prepared with less salt or no salt, if possible. What foods are recommended? The items listed may not be a complete list. Talk with your dietitian about what dietary choices are best for you. Grains  Whole-grain or whole-wheat bread. Whole-grain or whole-wheat pasta. Brown rice. [place]. Bulgur. Whole-grain and low-sodium cereals. Pita bread. Low-fat, low-sodium crackers. Whole-wheat flour tortillas. Vegetables  Fresh or frozen vegetables (raw, steamed, roasted, or grilled). Low-sodium or  reduced-sodium tomato and vegetable juice. Low-sodium or reduced-sodium tomato sauce and tomato paste. Low-sodium or reduced-sodium canned vegetables. Fruits  All fresh, dried, or frozen fruit. Canned fruit in natural juice (without added sugar). Meat and other protein foods  Skinless chicken or [place]. Ground chicken or [place]. Pork with fat trimmed off. Fish and seafood. Egg whites. Dried beans, peas, or lentils. Unsalted nuts, nut butters, and seeds. Unsalted canned beans. Lean cuts of beef with fat trimmed off. Low-sodium, lean deli meat. Dairy  Low-fat (1%) or fat-free (skim) milk. Fat-free, low-fat, or reduced-fat cheeses. Nonfat, low-sodium ricotta or cottage cheese. Low-fat or nonfat yogurt. Low-fat, low-sodium cheese. Fats and oils  Soft margarine without trans fats. Vegetable oil. Low-fat, reduced-fat, or light mayonnaise and salad dressings (reduced-sodium). Canola, safflower, olive, soybean, and sunflower oils. Avocado. Seasoning and other foods  Herbs. Spices. Seasoning mixes without salt. Unsalted popcorn and pretzels. Fat-free sweets. What foods are not recommended? The items listed may not be a complete list. Talk with your dietitian about what dietary choices are best for you. Grains  Baked goods made with fat, such as croissants, muffins, or some breads. Dry pasta or rice meal packs. Vegetables  Creamed or fried vegetables. Vegetables in a cheese sauce. Regular canned vegetables (not low-sodium or reduced-sodium). Regular canned tomato sauce and paste (not low-sodium or reduced-sodium). Regular tomato and vegetable juice (not low-sodium or reduced-sodium). Angie Fava. Olives. Fruits  Canned fruit in a light or heavy syrup. Fried fruit. Fruit in cream or butter sauce. Meat and other protein foods  Fatty cuts of meat. Ribs. Fried meat. Berniece Salines. Sausage. Bologna and other processed lunch meats. Salami. Fatback. Hotdogs. Bratwurst. Salted nuts and seeds. Canned beans with added salt.  Canned or smoked fish. Whole eggs or egg yolks. Chicken or [place] with skin. Dairy  Whole or 2% milk, cream, and half-and-half. Whole or full-fat cream cheese. Whole-fat or sweetened yogurt. Full-fat cheese. Nondairy creamers. Whipped toppings. Processed cheese and cheese spreads. Fats and oils  Butter. Stick margarine. Lard. Shortening. Ghee. Bacon fat. Tropical oils, such as coconut, palm kernel, or palm oil. Seasoning and other foods  Salted popcorn and pretzels. Onion salt, garlic salt, seasoned salt, table salt, and sea salt. Worcestershire sauce. Tartar sauce. Barbecue sauce. Teriyaki sauce. Soy sauce, including reduced-sodium. Steak sauce. Canned and packaged gravies. Fish sauce. Oyster sauce. Cocktail sauce. Horseradish that you find on the shelf. Ketchup. Mustard. Meat flavorings and tenderizers. Bouillon cubes. Hot sauce and Tabasco sauce. Premade or packaged marinades. Premade or packaged taco seasonings. Relishes. Regular salad dressings. Where to find more information:  National Heart, Lung, and [place]: https://wilson-eaton.com/  American Heart Association: www.heart.org Summary  The DASH eating plan is a healthy eating plan that has been shown to reduce high blood pressure (hypertension). It may also reduce your risk for type 2 diabetes, heart disease, and stroke.  With the DASH eating plan, you should limit salt (sodium) intake to 2,300 mg a day. If you have hypertension, you  may need to reduce your sodium intake to 1,500 mg a day.  When on the DASH eating plan, aim to eat more fresh fruits and vegetables, whole grains, lean proteins, low-fat dairy, and heart-healthy fats.  Work with your health care provider or diet and nutrition specialist (dietitian) to adjust your eating plan to your individual calorie needs. This information is not intended to replace advice given to you by your health care provider. Make sure you discuss any questions you have with your health care  provider. Document Released: 04/14/2011 Document Revised: 04/18/2016 Document Reviewed: 04/18/2016 Elsevier Interactive Patient Education  2017 Elsevier Inc.  Peripheral Edema Peripheral edema is swelling that is caused by a buildup of fluid. Peripheral edema most often affects the lower legs, ankles, and feet. It can also develop in the arms, hands, and face. The area of the body that has peripheral edema will look swollen. It may also feel heavy or warm. Your clothes may start to feel tight. Pressing on the area may make a temporary dent in your skin. You may not be able to move your arm or leg as much as usual. There are many causes of peripheral edema. It can be a complication of other diseases, such as congestive heart failure, kidney disease, or a problem with your blood circulation. It also can be a side effect of certain medicines. It often happens to women during pregnancy. Sometimes, the cause is not known. Treating the underlying condition is often the only treatment for peripheral edema. Follow these instructions at home: Pay attention to any changes in your symptoms. Take these actions to help with your discomfort:  Raise (elevate) your legs while you are sitting or lying down.  Move around often to prevent stiffness and to lessen swelling. Do not sit or stand for long periods of time.  Wear support stockings as told by your health care provider.  Follow instructions from your health care provider about limiting salt (sodium) in your diet. Sometimes eating less salt can reduce swelling.  Take over-the-counter and prescription medicines only as told by your health care provider. Your health care provider may prescribe medicine to help your body get rid of excess water (diuretic).  Keep all follow-up visits as told by your health care provider. This is important. Contact a health care provider if:  You have a fever.  Your edema starts suddenly or is getting worse, especially if  you are pregnant or have a medical condition.  You have swelling in only one leg.  You have increased swelling and pain in your legs. Get help right away if:  You develop shortness of breath, especially when you are lying down.  You have pain in your chest or abdomen.  You feel weak.  You faint. This information is not intended to replace advice given to you by your health care provider. Make sure you discuss any questions you have with your health care provider. Document Released: 06/02/2004 Document Revised: 09/28/2015 Document Reviewed: 11/05/2014 Elsevier Interactive Patient Education  2017 [place]. Furosemide tablets What is this medicine? FUROSEMIDE (fyoor [place] se mide) is a diuretic. It helps you make more urine and to lose salt and excess water from your body. This medicine is used to treat high blood pressure, and edema or swelling from heart, kidney, or liver disease. This medicine may be used for other purposes; ask your health care provider or pharmacist if you have questions. COMMON BRAND NAME(S): Active-Medicated Specimen Kit, Delone, Diuscreen, Lasix, RX Specimen Collection  Kit, Specimen Collection Kit, [place] Medicated Specimen Collection What should I tell my health care provider before I take this medicine? They need to know if you have any of these conditions: -abnormal blood electrolytes -diarrhea or vomiting -gout -heart disease -kidney disease, small amounts of urine, or difficulty passing urine -liver disease -thyroid disease -an unusual or allergic reaction to furosemide, sulfa drugs, other medicines, foods, dyes, or preservatives -pregnant or trying to get pregnant -breast-feeding How should I use this medicine? Take this medicine by mouth with a glass of water. Follow the directions on the prescription label. You may take this medicine with or without food. If it upsets your stomach, take it with food or milk. Do not take your medicine more often than  directed. Remember that you will need to pass more urine after taking this medicine. Do not take your medicine at a time of day that will cause you problems. Do not take at bedtime. Talk to your pediatrician regarding the use of this medicine in children. While this drug may be prescribed for selected conditions, precautions do apply. Overdosage: If you think you have taken too much of this medicine contact a poison control center or emergency room at once. NOTE: This medicine is only for you. Do not share this medicine with others. What if I miss a dose? If you miss a dose, take it as soon as you can. If it is almost time for your next dose, take only that dose. Do not take double or extra doses. What may interact with this medicine? -aspirin and aspirin-like medicines -certain antibiotics -chloral hydrate -cisplatin -cyclosporine -digoxin -diuretics -laxatives -lithium -medicines for blood pressure -medicines that relax muscles for surgery -methotrexate -NSAIDs, medicines for pain and inflammation like ibuprofen, naproxen, or indomethacin -phenytoin -steroid medicines like prednisone or cortisone -sucralfate -thyroid hormones This list may not describe all possible interactions. Give your health care provider a list of all the medicines, herbs, non-prescription drugs, or dietary supplements you use. Also tell them if you smoke, drink alcohol, or use illegal drugs. Some items may interact with your medicine. What should I watch for while using this medicine? Visit your doctor or health care professional for regular checks on your progress. Check your blood pressure regularly. Ask your doctor or health care professional what your blood pressure should be, and when you should contact him or her. If you are a diabetic, check your blood sugar as directed. You may need to be on a special diet while taking this medicine. Check with your doctor. Also, ask how many glasses of fluid you need to  drink a day. You must not get dehydrated. You may get drowsy or dizzy. Do not drive, use machinery, or do anything that needs mental alertness until you know how this drug affects you. Do not stand or sit up quickly, especially if you are an older patient. This reduces the risk of dizzy or fainting spells. Alcohol can make you more drowsy and dizzy. Avoid alcoholic drinks. This medicine can make you more sensitive to the sun. Keep out of the sun. If you cannot avoid being in the sun, wear protective clothing and use sunscreen. Do not use sun lamps or tanning beds/booths. What side effects may I notice from receiving this medicine? Side effects that you should report to your doctor or health care professional as soon as possible: -blood in urine or stools -dry mouth -fever or chills -hearing loss or ringing in the ears -irregular heartbeat -muscle pain or  weakness, cramps -skin rash -stomach upset, pain, or nausea -tingling or numbness in the hands or feet -unusually weak or tired -vomiting or diarrhea -yellowing of the eyes or skin Side effects that usually do not require medical attention (report to your doctor or health care professional if they continue or are bothersome): -headache -loss of appetite -unusual bleeding or bruising This list may not describe all possible side effects. Call your doctor for medical advice about side effects. You may report side effects to FDA at 1-800-FDA-1088. Where should I keep my medicine? Keep out of the reach of children. Store at room temperature between 15 and 30 degrees C (59 and 86 degrees F). Protect from light. Throw away any unused medicine after the expiration date. NOTE: This sheet is a summary. It may not cover all possible information. If you have questions about this medicine, talk to your doctor, pharmacist, or health care provider.  2018 Elsevier/Gold Standard (2014-07-16 13:49:50)

## 2016-09-19 LAB — CYTOLOGY - PAP
ADEQUACY: ABSENT
Bacterial vaginitis: POSITIVE — AB
Candida vaginitis: NEGATIVE
Chlamydia: NEGATIVE
DIAGNOSIS: NEGATIVE
Neisseria Gonorrhea: NEGATIVE

## 2016-09-20 ENCOUNTER — Other Ambulatory Visit: Payer: Self-pay | Admitting: Family Medicine

## 2016-09-20 DIAGNOSIS — N760 Acute vaginitis: Principal | ICD-10-CM

## 2016-09-20 DIAGNOSIS — B9689 Other specified bacterial agents as the cause of diseases classified elsewhere: Secondary | ICD-10-CM

## 2016-09-20 MED ORDER — METRONIDAZOLE 500 MG PO TABS: 500.0000 mg | ORAL_TABLET | Freq: Two times a day (BID) | ORAL | 0 refills | Status: DC

## 2016-09-20 NOTE — Progress Notes (Signed)
Meds ordered this encounter  Medications  . metroNIDAZOLE (FLAGYL) 500 MG tablet    Sig: Take 1 tablet (500 mg total) by mouth 2 (two) times daily.    Dispense:  14 tablet    Refill:  0    LaChina Moore Hollis  MSN, FNP-C [facility] [address]  [place], [place] [address] [phone]  

## 2016-09-21 NOTE — Progress Notes (Signed)
Called and spoke with patient advised of negative pap smear and that pap showed bacteria vaginosis. Advised that she would need to take Metronidazole twice daily for 7 days to correct this. Advised that she should avoid alcoholic beverages while taking the metronidazole and that this has been sent to her pharmacy. Patient verbalized understanding. Thanks!

## 2016-09-23 ENCOUNTER — Ambulatory Visit (HOSPITAL_COMMUNITY)
Admission: RE | Admit: 2016-09-23 | Discharge: 2016-09-23 | Disposition: A | Discharge status: Home / Self Care | Payer: BLUE CROSS/BLUE SHIELD | Source: Ambulatory Visit | Attending: Family Medicine | Admitting: Family Medicine

## 2016-09-23 DIAGNOSIS — R600 Localized edema: Secondary | ICD-10-CM

## 2016-09-23 DIAGNOSIS — R0609 Other forms of dyspnea: Secondary | ICD-10-CM

## 2016-09-23 DIAGNOSIS — R0600 Dyspnea, unspecified: Secondary | ICD-10-CM

## 2016-09-23 DIAGNOSIS — R002 Palpitations: Secondary | ICD-10-CM | POA: Diagnosis present

## 2016-09-23 DIAGNOSIS — I348 Other nonrheumatic mitral valve disorders: Secondary | ICD-10-CM | POA: Insufficient documentation

## 2016-09-23 NOTE — Progress Notes (Signed)
*  PRELIMINARY RESULTS* Echocardiogram 2D Echocardiogram has been performed.  Christie Walker, Christie Walker 09/23/2016, 10:40 AM

## 2016-10-04 ENCOUNTER — Other Ambulatory Visit: Payer: Self-pay | Admitting: Family Medicine

## 2016-10-04 DIAGNOSIS — E8881 Metabolic syndrome: Secondary | ICD-10-CM

## 2016-10-04 DIAGNOSIS — Z6841 Body Mass Index (BMI) 40.0 and over, adult: Principal | ICD-10-CM

## 2016-10-13 ENCOUNTER — Ambulatory Visit: Payer: BLUE CROSS/BLUE SHIELD | Admitting: Family Medicine

## 2016-10-18 ENCOUNTER — Emergency Department (HOSPITAL_COMMUNITY): Payer: BLUE CROSS/BLUE SHIELD

## 2016-10-18 ENCOUNTER — Emergency Department (HOSPITAL_COMMUNITY)
Admission: EM | Admit: 2016-10-18 | Discharge: 2016-10-18 | Disposition: A | Discharge status: Home / Self Care | Payer: BLUE CROSS/BLUE SHIELD | Attending: Emergency Medicine | Admitting: Emergency Medicine

## 2016-10-18 ENCOUNTER — Encounter (HOSPITAL_COMMUNITY): Payer: Self-pay | Admitting: Emergency Medicine

## 2016-10-18 DIAGNOSIS — J069 Acute upper respiratory infection, unspecified: Secondary | ICD-10-CM

## 2016-10-18 DIAGNOSIS — Z7984 Long term (current) use of oral hypoglycemic drugs: Secondary | ICD-10-CM | POA: Insufficient documentation

## 2016-10-18 DIAGNOSIS — R05 Cough: Secondary | ICD-10-CM

## 2016-10-18 DIAGNOSIS — Z79899 Other long term (current) drug therapy: Secondary | ICD-10-CM | POA: Diagnosis not present

## 2016-10-18 DIAGNOSIS — I10 Essential (primary) hypertension: Secondary | ICD-10-CM | POA: Insufficient documentation

## 2016-10-18 DIAGNOSIS — R059 Cough, unspecified: Secondary | ICD-10-CM

## 2016-10-18 LAB — RAPID STREP SCREEN (MED CTR MEBANE ONLY): STREPTOCOCCUS, GROUP A SCREEN (DIRECT): NEGATIVE

## 2016-10-18 MED ORDER — DEXAMETHASONE SODIUM PHOSPHATE 10 MG/ML IJ SOLN
10.0000 mg | Freq: Once | INTRAMUSCULAR | Status: AC
  Administered 2016-10-18: 10 mg via INTRAMUSCULAR
  Filled 2016-10-18: qty 1

## 2016-10-18 MED ORDER — BENZONATATE 100 MG PO CAPS: 100.0000 mg | ORAL_CAPSULE | Freq: Three times a day (TID) | ORAL | 0 refills | Status: DC

## 2016-10-18 MED ORDER — ALBUTEROL SULFATE HFA 108 (90 BASE) MCG/ACT IN AERS
2.0000 | INHALATION_SPRAY | RESPIRATORY_TRACT | Status: DC | PRN
  Administered 2016-10-18: 2 via RESPIRATORY_TRACT
  Filled 2016-10-18: qty 6.7

## 2016-10-18 MED ORDER — GUAIFENESIN-CODEINE 100-10 MG/5ML PO SOLN
5.0000 mL | Freq: Once | ORAL | Status: AC
  Administered 2016-10-18: 5 mL via ORAL
  Filled 2016-10-18: qty 5

## 2016-10-18 NOTE — ED Provider Notes (Signed)
[facility]-EMERGENCY DEPT Provider Note   CSN: [id number] Arrival date & time: 10/18/16  0731     History   Chief Complaint Chief Complaint  Patient presents with  . Cough  . chest heaviness  . Sore Throat    HPI Christie Walker is a 46 y.o. female.  The history is provided by the patient and medical records. No language interpreter was used.  Cough  Associated symptoms include chest pain (When coughing) and sore throat. Pertinent negatives include no chills, no ear pain and no shortness of breath.  Sore Throat  Associated symptoms include chest pain (When coughing). Pertinent negatives include no shortness of breath.   Christie Walker is a 46 y.o. female  with a PMH of HTN, bipolar disorder, obesity who presents to the Emergency Department complaining of productive cough, nasal congestion and chest pain worse when coughing x 4 days. Associated with sore throat as well. She has tried Robitussin with no relief. Denies sick contacts. No trouble breathing or swallowing. No fever or chills. She notes that this happened around the same time last year and was given an albuterol inhaler which seemed to really help.   Past Medical History:  Diagnosis Date  . Acid reflux   . Bipolar 1 disorder (HCC)   . Essential hypertension 06/14/2016    Patient Active Problem List   Diagnosis Date Noted  . Chest pain at rest 08/05/2016  . Morbid obesity with BMI of 50.0-59.9, adult (HCC) 08/05/2016  . Dyspnea on exertion 08/05/2016  . Essential hypertension 06/14/2016  . Metabolic syndrome 06/14/2016  . Chronic knee pain 06/14/2016  . OBESITY 03/08/2007  . TMJ SYNDROME 03/08/2007  . GERD 03/08/2007    History reviewed. No pertinent surgical history.  OB History    No data available       Home Medications    Prior to Admission medications   Medication Sig Start Date End Date Taking? Authorizing Provider  albuterol (PROVENTIL HFA;VENTOLIN HFA) 108 (90 Base) MCG/ACT inhaler  Inhale 2 puffs into the lungs every 6 (six) hours as needed for wheezing or shortness of breath.   Yes [provider]  fluticasone (FLONASE) 50 MCG/ACT nasal spray Place 1 spray into both nostrils daily.   Yes [provider]  furosemide (LASIX) 20 MG tablet Take 1 tablet (20 mg total) by mouth daily. 09/15/16  Yes Massie MaroonHollis, Lachina M, FNP  gabapentin (NEURONTIN) 100 MG capsule Take 100 mg by mouth 2 (two) times daily.    Yes [provider]  IRON PO Take 1 tablet by mouth daily.   Yes [provider]  loratadine-pseudoephedrine (CLARITIN-D 24-HOUR) 10-240 MG 24 hr tablet Take 1 tablet by mouth daily as needed for allergies.    Yes [provider]  lurasidone (LATUDA) 40 MG TABS tablet Take 40 mg by mouth daily.   Yes [provider]  benzonatate (TESSALON) 100 MG capsule Take 1 capsule (100 mg total) by mouth every 8 (eight) hours. 10/18/16   Ward, Chase PicketJaime Pilcher, PA-C  Menthol, Topical Analgesic, (BIOFREEZE) 4 % GEL Apply 1 each topically every 6 (six) hours as needed. Patient not taking: Reported on 09/15/2016 06/14/16   Massie MaroonHollis, Lachina M, FNP  metFORMIN (GLUCOPHAGE) 500 MG tablet Take 1 tablet (500 mg total) by mouth daily with breakfast. Patient not taking: Reported on 10/18/2016 06/15/16   Massie MaroonHollis, Lachina M, FNP  metroNIDAZOLE (FLAGYL) 500 MG tablet Take 1 tablet (500 mg total) by mouth 2 (two) times daily. Patient not taking:  Reported on 10/18/2016 09/20/16   Massie Maroon, FNP  omeprazole (PRILOSEC) 20 MG capsule Take 1 capsule (20 mg total) by mouth daily. Patient not taking: Reported on 10/18/2016 06/15/16   Massie Maroon, FNP    Family History Family History  Problem Relation Age of Onset  . Mental illness Other     Social History Social History  Substance Use Topics  . Smoking status: Never Smoker  . Smokeless tobacco: Never Used  . Alcohol use No     Allergies   Bee venom; Amoxicillin; Hydrocodone; Insect extract allergy  skin test; and Propoxyphene n-acetaminophen   Review of Systems Review of Systems  Constitutional: Negative for chills and fever.  HENT: Positive for congestion and sore throat. Negative for ear pain, sinus pain, trouble swallowing and voice change.   Respiratory: Positive for cough. Negative for shortness of breath.   Cardiovascular: Positive for chest pain (When coughing).  All other systems reviewed and are negative.    Physical Exam Updated Vital Signs BP 116/67 (BP Location: Right Arm)   Pulse 88   Temp 97.6 F (36.4 C) (Oral)   Resp 19   LMP 09/19/2016 (Approximate)   SpO2 97%   Physical Exam  Constitutional: She is oriented to person, place, and time. She appears well-developed and well-nourished. No distress.  HENT:  Head: Normocephalic and atraumatic.  OP with erythema and tonsillary hypertrophy. No exudates. + nasal congestion with mucosal edema. No focal areas of sinus tenderness.  Neck: Normal range of motion. Neck supple.  Cardiovascular: Normal rate, regular rhythm and normal heart sounds.   Pulmonary/Chest: Effort normal.  Lungs are clear to auscultation bilaterally - no w/r/r  Abdominal: Soft. She exhibits no distension. There is no tenderness.  Musculoskeletal: Normal range of motion.  Neurological: She is alert and oriented to person, place, and time.  Skin: Skin is warm and dry. She is not diaphoretic.  Nursing note and vitals reviewed.    ED Treatments / Results  Labs (all labs ordered are listed, but only abnormal results are displayed) Labs Reviewed  RAPID STREP SCREEN (NOT AT [facility])  CULTURE, GROUP A STREP [facility])    EKG  EKG Interpretation  Date/Time:  Tuesday October 18 2016 07:40:59 EDT Ventricular Rate:  88 PR Interval:    QRS Duration: 97 QT Interval:  372 QTC Calculation: 451 R Axis:   -38 Text Interpretation:  Sinus rhythm Low voltage, precordial leads Left ventricular hypertrophy Baseline wander in lead(s) II III aVF Nonspecific T  wave abnormality No significant change since last tracing Confirmed by Denton Lank  MD, Caryn Bee ([id number]) on 10/18/2016 8:33:44 AM       Radiology Dg Chest 2 View  Result Date: 10/18/2016 CLINICAL DATA:  Cough and congestion with chest heaviness for several days EXAM: CHEST  2 VIEW COMPARISON:  08/04/2016 FINDINGS: Mild cardiomegaly. Normal vascularity. Lungs are under aerated and clear. No pneumothorax or pleural effusion. IMPRESSION: No active cardiopulmonary disease. Electronically Signed   By: Jolaine Click M.D.   On: 10/18/2016 08:30    Procedures Procedures (including critical care time)  Medications Ordered in ED Medications  albuterol (PROVENTIL HFA;VENTOLIN HFA) 108 (90 Base) MCG/ACT inhaler 2 puff (2 puffs Inhalation Given 10/18/16 0909)  dexamethasone (DECADRON) injection 10 mg (not administered)  guaiFENesin-codeine 100-10 MG/5ML solution 5 mL (5 mLs Oral Given 10/18/16 0909)     Initial Impression / Assessment and Plan / ED Course  I have reviewed the triage vital signs and the nursing notes.  Pertinent labs & imaging results that were available during my care of the patient were reviewed by me and considered in my medical decision making (see chart for details).    Christie Walker is a 46 y.o. female who presents to ED for cough, congestion and sore throat.  On exam, patient is afebrile, non-toxic appearing with a clear lung exam. Mild rhinorrhea and OP with erythema and tonsillar hypertrophy but no exudates.  CXR and Rapid strep negative.   Given cough suppressant, inhaler and decadron in ED.   Sxs today likely due to viral URI.  Symptomatic home care instructions discussed.  PCP follow up strongly encouraged if symptoms persist. Reasons to return to ER discussed. All questions answered.   Blood pressure 116/67, pulse 88, temperature 97.6 F (36.4 C), temperature source Oral, resp. rate 19, last menstrual period 09/19/2016, SpO2 97 %.   Final Clinical  Impressions(s) / ED Diagnoses   Final diagnoses:  Cough  Upper respiratory tract infection, unspecified type    New Prescriptions New Prescriptions   BENZONATATE (TESSALON) 100 MG CAPSULE    Take 1 capsule (100 mg total) by mouth every 8 (eight) hours.     Ward, Chase Picket, PA-C 10/18/16 7829    Cathren Laine, MD 10/18/16 1012

## 2016-10-18 NOTE — Discharge Instructions (Signed)
It was my pleasure taking care of you today!   Your symptoms are likely due to a viral upper respiratory infection. Fortunately, we did not see evidence of serious infection and can treat your symptoms. Flonase and mucinex for nasal congestion, tessalon as needed for cough. Alternate between Tylenol and ibuprofen as needed for body aches.   Rest, drink plenty of fluids to be sure you are staying hydrated.   Please follow up with your primary doctor for discussion of your diagnoses and further evaluation after today's visit if symptoms persist longer than 7 days; Return to the ER for high fevers, difficulty breathing or other concerning symptoms

## 2016-10-18 NOTE — ED Triage Notes (Signed)
Pt c/o chest congestion and chest heaviness x several days. Pt reports productive cough, with green sputum. Pt also c/o sore throat and states she has been around others that are sick.

## 2016-10-20 ENCOUNTER — Encounter: Payer: Self-pay | Admitting: Family Medicine

## 2016-10-20 ENCOUNTER — Ambulatory Visit (INDEPENDENT_AMBULATORY_CARE_PROVIDER_SITE_OTHER): Payer: BLUE CROSS/BLUE SHIELD | Admitting: Family Medicine

## 2016-10-20 VITALS — BP 106/61 | HR 84 | Temp 98.4°F | Resp 16 | Ht 64.5 in | Wt 342.0 lb

## 2016-10-20 DIAGNOSIS — R7303 Prediabetes: Secondary | ICD-10-CM

## 2016-10-20 DIAGNOSIS — J351 Hypertrophy of tonsils: Secondary | ICD-10-CM | POA: Diagnosis not present

## 2016-10-20 DIAGNOSIS — J029 Acute pharyngitis, unspecified: Secondary | ICD-10-CM

## 2016-10-20 DIAGNOSIS — M545 Low back pain, unspecified: Secondary | ICD-10-CM

## 2016-10-20 LAB — POCT URINALYSIS DIP (DEVICE)
BILIRUBIN URINE: NEGATIVE
Glucose, UA: NEGATIVE mg/dL
HGB URINE DIPSTICK: NEGATIVE
Ketones, ur: NEGATIVE mg/dL
Leukocytes, UA: NEGATIVE
Nitrite: NEGATIVE
Protein, ur: NEGATIVE mg/dL
SPECIFIC GRAVITY, URINE: 1.025 (ref 1.005–1.030)
Urobilinogen, UA: 0.2 mg/dL (ref 0.0–1.0)
pH: 5 (ref 5.0–8.0)

## 2016-10-20 LAB — CULTURE, GROUP A STREP (THRC)

## 2016-10-20 MED ORDER — KETOROLAC TROMETHAMINE 60 MG/2ML IM SOLN
60.0000 mg | Freq: Once | INTRAMUSCULAR | Status: AC
  Administered 2016-10-20: 60 mg via INTRAMUSCULAR

## 2016-10-20 MED ORDER — METFORMIN HCL 500 MG PO TABS: 500.0000 mg | ORAL_TABLET | Freq: Every day | ORAL | 1 refills | Status: DC

## 2016-10-20 MED ORDER — NAPROXEN 500 MG PO TABS: 500.0000 mg | ORAL_TABLET | Freq: Two times a day (BID) | ORAL | 0 refills | Status: DC

## 2016-10-20 NOTE — Progress Notes (Signed)
Christie Walker, a 46 year old female presents complaining of right sided back pain over the past 3 weeks. She has been taking Tylenol without sustained relief. Symptoms have been present for several weeks. She works a physical job, but denies any injury. She says that she has been primarily sleeping on the couch. She has noticed pain primarily to right side. Right back is tender to palpation. Exacerbating factors identifiable by patient are bending sideways, recumbency, sitting and standing. Patient last had Tylenol on last night without sustained relief.    Patient also complains of frequent sore throats. Associated symptoms include post nasal drip and sore throat. She says that tonsils are frequently enlarged. She was recently evaluated in the emergency room an treated for an upper respiratory infection.   Past Medical History:  Diagnosis Date  . Acid reflux   . Bipolar 1 disorder (HCC)   . Essential hypertension 06/14/2016   Social History   Social History  . Marital status: Legally Separated    Spouse name: N/A  . Number of children: N/A  . Years of education: N/A   Occupational History  . Not on file.   Social History Main Topics  . Smoking status: Never Smoker  . Smokeless tobacco: Never Used  . Alcohol use No  . Drug use: No  . Sexual activity: Not on file   Other Topics Concern  . Not on file   Social History Narrative  . No narrative on file  Review of Systems  Constitutional: Negative.  Negative for chills and fever.  HENT: Negative.   Respiratory: Negative.   Cardiovascular: Negative.   Gastrointestinal: Negative.   Genitourinary: Negative.   Musculoskeletal: Positive for back pain.  Skin: Negative.   Neurological: Negative.  Negative for weakness.  Endo/Heme/Allergies: Negative.   Psychiatric/Behavioral: Negative.    Physical Exam  Constitutional: She is well-developed, well-nourished, and in no distress.  HENT:  Mouth/Throat: Posterior oropharyngeal erythema  present.  Enlarged tonsils  Eyes: Lids are normal. Lids are everted and swept, no foreign bodies found.  Cardiovascular: Normal pulses.   Pulmonary/Chest: Breath sounds normal.  Musculoskeletal:       Lumbar back: She exhibits decreased range of motion, pain and spasm.     Plan   1. Acute right-sided low back pain without sciatica Apply cool compresses to back 20 minutes 4 times per day as needed. Use interchangable with warm compresses. Refrain from lifting over 10 pounds.  - ketorolac (TORADOL) injection 60 mg; Inject 2 mLs (60 mg total) into the muscle once. - naproxen (NAPROSYN) 500 MG tablet; Take 1 tablet (500 mg total) by mouth 2 (two) times daily with a meal.  Dispense: 30 tablet; Refill: 0  2. Prediabetes Patient has decreased weight by 8 pounds. The patient is asked to make an attempt to improve diet and exercise patterns to aid in medical management of this problem. - metFORMIN (GLUCOPHAGE) 500 MG tablet; Take 1 tablet (500 mg total) by mouth daily with breakfast.  Dispense: 90 tablet; Refill: 1  3. Sore throat Tonsils consistently enlarged and patient reports frequent sore throat pain. Will send a referral to ENT for further workup and evaluation.  - Ambulatory referral to ENT  4. Enlarged tonsils - Ambulatory referral to ENT   RTC: 3 months for prediabetes   Nolon NationsLaChina Moore Timotheus Salm  MSN, FNP-C [facility] [address]  [place], [place] [id number] [phone]

## 2016-10-20 NOTE — Patient Instructions (Addendum)
Right sided back pain:  Received Toradol 60 mg IV without complication Naprosyn 500 mg twice daily as needed with food.  Given written information for back stretches/exercises Apply cool compresses 20 minutes 4 times per day/use interchangeably with heat   Sore throat/enlarged tonsils: Sent referral to ENT for further workup and evaluation   Bariatric referral:  You will have to attend the bariatric seminar before a consult is scheduled.  Recommend a lowfat, low carbohydrate diet divided over 5-6 small meals, increase water intake to 6-8 glasses, and 150 minutes per week of cardiovascular exercise.        Back Exercises If you have pain in your back, do these exercises 2-3 times each day or as told by your doctor. When the pain goes away, do the exercises once each day, but repeat the steps more times for each exercise (do more repetitions). If you do not have pain in your back, do these exercises once each day or as told by your doctor. Exercises Single Knee to Chest  Do these steps 3-5 times in a row for each leg: 1. Lie on your back on a firm bed or the floor with your legs stretched out. 2. Bring one knee to your chest. 3. Hold your knee to your chest by grabbing your knee or thigh. 4. Pull on your knee until you feel a gentle stretch in your lower back. 5. Keep doing the stretch for 10-30 seconds. 6. Slowly let go of your leg and straighten it.  Pelvic Tilt  Do these steps 5-10 times in a row: 1. Lie on your back on a firm bed or the floor with your legs stretched out. 2. Bend your knees so they point up to the ceiling. Your feet should be flat on the floor. 3. Tighten your lower belly (abdomen) muscles to press your lower back against the floor. This will make your tailbone point up to the ceiling instead of pointing down to your feet or the floor. 4. Stay in this position for 5-10 seconds while you gently tighten your muscles and breathe evenly.  Cat-Cow  Do these  steps until your lower back bends more easily: 1. Get on your hands and knees on a firm surface. Keep your hands under your shoulders, and keep your knees under your hips. You may put padding under your knees. 2. Let your head hang down, and make your tailbone point down to the floor so your lower back is round like the back of a cat. 3. Stay in this position for 5 seconds. 4. Slowly lift your head and make your tailbone point up to the ceiling so your back hangs low (sags) like the back of a cow. 5. Stay in this position for 5 seconds.  Press-Ups  Do these steps 5-10 times in a row: 1. Lie on your belly (face-down) on the floor. 2. Place your hands near your head, about shoulder-width apart. 3. While you keep your back relaxed and keep your hips on the floor, slowly straighten your arms to raise the top half of your body and lift your shoulders. Do not use your back muscles. To make yourself more comfortable, you may change where you place your hands. 4. Stay in this position for 5 seconds. 5. Slowly return to lying flat on the floor.  Bridges  Do these steps 10 times in a row: 1. Lie on your back on a firm surface. 2. Bend your knees so they point up to the ceiling. Your  feet should be flat on the floor. 3. Tighten your butt muscles and lift your butt off of the floor until your waist is almost as high as your knees. If you do not feel the muscles working in your butt and the back of your thighs, slide your feet 1-2 inches farther away from your butt. 4. Stay in this position for 3-5 seconds. 5. Slowly lower your butt to the floor, and let your butt muscles relax.  If this exercise is too easy, try doing it with your arms crossed over your chest. Belly Crunches  Do these steps 5-10 times in a row: 1. Lie on your back on a firm bed or the floor with your legs stretched out. 2. Bend your knees so they point up to the ceiling. Your feet should be flat on the floor. 3. Cross your arms  over your chest. 4. Tip your chin a little bit toward your chest but do not bend your neck. 5. Tighten your belly muscles and slowly raise your chest just enough to lift your shoulder blades a tiny bit off of the floor. 6. Slowly lower your chest and your head to the floor.  Back Lifts Do these steps 5-10 times in a row: 1. Lie on your belly (face-down) with your arms at your sides, and rest your forehead on the floor. 2. Tighten the muscles in your legs and your butt. 3. Slowly lift your chest off of the floor while you keep your hips on the floor. Keep the back of your head in line with the curve in your back. Look at the floor while you do this. 4. Stay in this position for 3-5 seconds. 5. Slowly lower your chest and your face to the floor.  Contact a doctor if:  Your back pain gets a lot worse when you do an exercise.  Your back pain does not lessen 2 hours after you exercise. If you have any of these problems, stop doing the exercises. Do not do them again unless your doctor says it is okay. Get help right away if:  You have sudden, very bad back pain. If this happens, stop doing the exercises. Do not do them again unless your doctor says it is okay. This information is not intended to replace advice given to you by your health care provider. Make sure you discuss any questions you have with your health care provider. Document Released: 05/28/2010 Document Revised: 10/01/2015 Document Reviewed: 06/19/2014 Elsevier Interactive Patient Education  2018 [employer]  Back Pain, Adult Back pain is very common. The pain often gets better over time. The cause of back pain is usually not dangerous. Most people can learn to manage their back pain on their own. Follow these instructions at home: Watch your back pain for any changes. The following actions may help to lessen any pain you are feeling:  Stay active. Start with short walks on flat ground if you can. Try to walk farther each  day.  Exercise regularly as told by your doctor. Exercise helps your back heal faster. It also helps avoid future injury by keeping your muscles strong and flexible.  Do not sit, drive, or stand in one place for more than 30 minutes.  Do not stay in bed. Resting more than 1-2 days can slow down your recovery.  Be careful when you bend or lift an object. Use good form when lifting: ? Bend at your knees. ? Keep the object close to your body. ? Do  not twist.  Sleep on a firm mattress. Lie on your side, and bend your knees. If you lie on your back, put a pillow under your knees.  Take medicines only as told by your doctor.  Put ice on the injured area. ? Put ice in a plastic bag. ? Place a towel between your skin and the bag. ? Leave the ice on for 20 minutes, 2-3 times a day for the first 2-3 days. After that, you can switch between ice and heat packs.  Avoid feeling anxious or stressed. Find good ways to deal with stress, such as exercise.  Maintain a healthy weight. Extra weight puts stress on your back.  Contact a doctor if:  You have pain that does not go away with rest or medicine.  You have worsening pain that goes down into your legs or buttocks.  You have pain that does not get better in one week.  You have pain at night.  You lose weight.  You have a fever or chills. Get help right away if:  You cannot control when you poop (bowel movement) or pee (urinate).  Your arms or legs feel weak.  Your arms or legs lose feeling (numbness).  You feel sick to your stomach (nauseous) or throw up (vomit).  You have belly (abdominal) pain.  You feel like you may pass out (faint). This information is not intended to replace advice given to you by your health care provider. Make sure you discuss any questions you have with your health care provider. Document Released: 10/12/2007 Document Revised: 10/01/2015 Document Reviewed: 08/27/2013 Elsevier Interactive Patient  Education  [employer].

## 2016-11-03 ENCOUNTER — Emergency Department (HOSPITAL_COMMUNITY)
Admission: EM | Admit: 2016-11-03 | Discharge: 2016-11-03 | Disposition: A | Discharge status: Home / Self Care | Payer: BLUE CROSS/BLUE SHIELD | Attending: Emergency Medicine | Admitting: Emergency Medicine

## 2016-11-03 ENCOUNTER — Emergency Department (HOSPITAL_COMMUNITY): Payer: BLUE CROSS/BLUE SHIELD

## 2016-11-03 ENCOUNTER — Encounter (HOSPITAL_COMMUNITY): Payer: Self-pay | Admitting: *Deleted

## 2016-11-03 DIAGNOSIS — I10 Essential (primary) hypertension: Secondary | ICD-10-CM | POA: Insufficient documentation

## 2016-11-03 DIAGNOSIS — R05 Cough: Secondary | ICD-10-CM | POA: Diagnosis present

## 2016-11-03 DIAGNOSIS — Z7984 Long term (current) use of oral hypoglycemic drugs: Secondary | ICD-10-CM | POA: Diagnosis not present

## 2016-11-03 DIAGNOSIS — Z79899 Other long term (current) drug therapy: Secondary | ICD-10-CM | POA: Diagnosis not present

## 2016-11-03 DIAGNOSIS — J40 Bronchitis, not specified as acute or chronic: Secondary | ICD-10-CM

## 2016-11-03 MED ORDER — PREDNISONE 20 MG PO TABS
ORAL_TABLET | ORAL | 0 refills | Status: DC
Start: 2016-11-03 — End: 2017-01-26

## 2016-11-03 MED ORDER — PREDNISONE 20 MG PO TABS
60.0000 mg | ORAL_TABLET | Freq: Once | ORAL | Status: AC
  Administered 2016-11-03: 60 mg via ORAL
  Filled 2016-11-03: qty 3

## 2016-11-03 MED ORDER — IPRATROPIUM-ALBUTEROL 0.5-2.5 (3) MG/3ML IN SOLN
3.0000 mL | Freq: Once | RESPIRATORY_TRACT | Status: AC
  Administered 2016-11-03: 3 mL via RESPIRATORY_TRACT
  Filled 2016-11-03: qty 3

## 2016-11-03 NOTE — ED Triage Notes (Signed)
Patient is alert and oriented x4.  She is being seen for chest pain that is from coughing with congestion.  Patient was seen in the ED two weeks ago for the same issue and Dx with URI.  Patient states that with the medicine she took that she has not gotten any better.

## 2016-11-03 NOTE — ED Provider Notes (Signed)
[facility]-EMERGENCY DEPT Provider Note   CSN: [id number] Arrival date & time: 11/03/16  1452   By signing my name below, I, Clarisse Gouge, attest that this documentation has been prepared under the direction and in the presence of [facility], PA-C. Electronically signed, Clarisse Gouge, ED Scribe. 11/03/16. 5:16 PM.   History   Chief Complaint Chief Complaint  Patient presents with  . Cough   The history is provided by the patient and medical records. No language interpreter was used.    Christie Walker is a 46 y.o. female with h/o HTN presenting to the Emergency Department concerning persistent cough x > 20 days. Intermittent sore throat, rhinorrhea, productive cough, chest tightness and chest congestion noted. Pt evaluated on 10/18/2016 in [facility] ED for these symptoms, and she states her symptoms were more severe at the time. She was prescribed tessalon pearls and given an albuterol inhaler at the time. She states she finished the tessalon pearls last week with improvement but incomplete relief to her symptoms. She reports her inhaler provides moderate, temporary relief to chest tightness. No SOB, fever, chills, body aches, leg swelling significant from baseline or ear pain. No other complaints at this time.   Past Medical History:  Diagnosis Date  . Acid reflux   . Bipolar 1 disorder (HCC)   . Essential hypertension 06/14/2016    Patient Active Problem List   Diagnosis Date Noted  . Chest pain at rest 08/05/2016  . Morbid obesity with BMI of 50.0-59.9, adult (HCC) 08/05/2016  . Dyspnea on exertion 08/05/2016  . Essential hypertension 06/14/2016  . Metabolic syndrome 06/14/2016  . Chronic knee pain 06/14/2016  . OBESITY 03/08/2007  . TMJ SYNDROME 03/08/2007  . GERD 03/08/2007    History reviewed. No pertinent surgical history.  OB History    No data available       Home Medications    Prior to Admission medications   Medication Sig Start Date End Date Taking?  Authorizing Provider  albuterol (PROVENTIL HFA;VENTOLIN HFA) 108 (90 Base) MCG/ACT inhaler Inhale 2 puffs into the lungs every 6 (six) hours as needed for wheezing or shortness of breath.    [provider]  benzonatate (TESSALON) 100 MG capsule Take 1 capsule (100 mg total) by mouth every 8 (eight) hours. 10/18/16   Ward, Chase Picket, PA-C  fluticasone (FLONASE) 50 MCG/ACT nasal spray Place 1 spray into both nostrils daily.    [provider]  furosemide (LASIX) 20 MG tablet Take 1 tablet (20 mg total) by mouth daily. 09/15/16   Massie Maroon, FNP  gabapentin (NEURONTIN) 100 MG capsule Take 100 mg by mouth 2 (two) times daily.     [provider]  IRON PO Take 1 tablet by mouth daily.    [provider]  loratadine-pseudoephedrine (CLARITIN-D 24-HOUR) 10-240 MG 24 hr tablet Take 1 tablet by mouth daily as needed for allergies.     [provider]  lurasidone (LATUDA) 40 MG TABS tablet Take 40 mg by mouth daily.    [provider]  metFORMIN (GLUCOPHAGE) 500 MG tablet Take 1 tablet (500 mg total) by mouth daily with breakfast. 10/20/16   Massie Maroon, FNP  naproxen (NAPROSYN) 500 MG tablet Take 1 tablet (500 mg total) by mouth 2 (two) times daily with a meal. 10/20/16   Massie Maroon, FNP  omeprazole (PRILOSEC) 20 MG capsule Take 1 capsule (20 mg total) by mouth daily. Patient not taking: Reported on 10/18/2016 06/15/16   Hart Rochester,  Rosalene Billings, FNP  predniSONE (DELTASONE) 20 MG tablet 2 tabs po daily x 4 days 11/03/16   Trixie Dredge, PA-C    Family History Family History  Problem Relation Age of Onset  . Mental illness Other     Social History Social History  Substance Use Topics  . Smoking status: Never Smoker  . Smokeless tobacco: Never Used  . Alcohol use No     Allergies   Bee venom; Amoxicillin; Hydrocodone; Insect extract allergy skin test; and Propoxyphene n-acetaminophen   Review of Systems Review of Systems    Constitutional: Negative for chills and fever.  HENT: Positive for congestion, rhinorrhea and sore throat. Negative for ear pain.   Respiratory: Positive for cough and chest tightness. Negative for shortness of breath and wheezing.   Cardiovascular: Negative for palpitations.  Musculoskeletal: Negative for arthralgias and myalgias.  All other systems reviewed and are negative.    Physical Exam Updated Vital Signs BP 114/75 (BP Location: Left Arm)   Pulse 82   Temp 97.8 F (36.6 C) (Oral)   Resp 18   Ht 5' 4.5" (1.638 m)   Wt (!) 342 lb (155.1 kg)   LMP 10/21/2016   SpO2 97%   BMI 57.80 kg/m   Physical Exam  Constitutional: She appears well-developed and well-nourished. No distress.  HENT:  Head: Normocephalic and atraumatic.  Mouth/Throat: Posterior oropharyngeal edema and posterior oropharyngeal erythema present. No oropharyngeal exudate or tonsillar abscesses.  Swelling to posterior pharynx is symmetric  Eyes: Conjunctivae are normal.  Neck: Neck supple.  Cardiovascular: Normal rate and regular rhythm.   Pulmonary/Chest: Effort normal and breath sounds normal. No respiratory distress. She has no wheezes. She has no rales.  Neurological: She is alert.  Skin: She is not diaphoretic.  Nursing note and vitals reviewed.    ED Treatments / Results  DIAGNOSTIC STUDIES: Oxygen Saturation is 97% on RA, NL by my interpretation.    COORDINATION OF CARE: 5:03 PM-Discussed next steps with pt. Pt verbalized understanding and is agreeable with the plan. Will order CXR to R/O pneumonia and provide a breathing treatment.   Labs (all labs ordered are listed, but only abnormal results are displayed) Labs Reviewed - No data to display  EKG  EKG Interpretation None       Radiology Dg Chest 2 View  Result Date: 11/03/2016 CLINICAL DATA:  Chest pain.  Cough and congestion. EXAM: CHEST  2 VIEW COMPARISON:  October 18, 2016 FINDINGS: The heart size and mediastinal contours are  within normal limits. Both lungs are clear. The visualized skeletal structures are unremarkable. IMPRESSION: No active cardiopulmonary disease. Electronically Signed   By: Gerome Sam III M.D   On: 11/03/2016 17:53    Procedures Procedures (including critical care time)  Medications Ordered in ED Medications  ipratropium-albuterol (DUONEB) 0.5-2.5 (3) MG/3ML nebulizer solution 3 mL (3 mLs Nebulization Given 11/03/16 1727)  predniSONE (DELTASONE) tablet 60 mg (60 mg Oral Given 11/03/16 1810)     Initial Impression / Assessment and Plan / ED Course  I have reviewed the triage vital signs and the nursing notes.  Pertinent labs & imaging results that were available during my care of the patient were reviewed by me and considered in my medical decision making (see chart for details).    Afebrile, nontoxic patient with slowly improving but persistent cough/cold symptoms that began about 3 weeks ago.  Has not had fevers.  Is having some symptoms improved with inhaler and neb treatment.  Lungs CTAB  on exam and CXR negative.  Will treat with steroids.  Strep screen negative at last visit.   D/C home with prednisone, continued albuterol, PCP follow up.  Discussed result, findings, treatment, and follow up  with patient.  Pt given return precautions.  Pt verbalizes understanding and agrees with plan.       Final Clinical Impressions(s) / ED Diagnoses   Final diagnoses:  Bronchitis    New Prescriptions New Prescriptions   PREDNISONE (DELTASONE) 20 MG TABLET    2 tabs po daily x 4 days    I personally performed the services described in this documentation, which was scribed in my presence. The recorded information has been reviewed and is accurate.    Trixie DredgeWest, Blase Beckner, PA-C 11/03/16 1814    Shaune PollackIsaacs, [place], MD 11/04/16 [phone]

## 2016-11-03 NOTE — Discharge Instructions (Signed)
Read the information below.  Use the prescribed medication as directed.  Please discuss all new medications with your pharmacist.  You may return to the Emergency Department at any time for worsening condition or any new symptoms that concern you.   If you develop worsening shortness of breath, uncontrolled wheezing, severe chest pain, or fevers despite using tylenol and/or ibuprofen, return for a recheck.     °

## 2017-01-26 ENCOUNTER — Encounter: Payer: Self-pay | Admitting: Family Medicine

## 2017-01-26 ENCOUNTER — Ambulatory Visit (INDEPENDENT_AMBULATORY_CARE_PROVIDER_SITE_OTHER): Payer: BLUE CROSS/BLUE SHIELD | Admitting: Family Medicine

## 2017-01-26 VITALS — BP 111/71 | HR 76 | Temp 98.6°F | Resp 16 | Ht 64.5 in | Wt 322.0 lb

## 2017-01-26 DIAGNOSIS — Z6841 Body Mass Index (BMI) 40.0 and over, adult: Secondary | ICD-10-CM

## 2017-01-26 DIAGNOSIS — S91331A Puncture wound without foreign body, right foot, initial encounter: Secondary | ICD-10-CM

## 2017-01-26 DIAGNOSIS — J029 Acute pharyngitis, unspecified: Secondary | ICD-10-CM

## 2017-01-26 DIAGNOSIS — R7303 Prediabetes: Secondary | ICD-10-CM

## 2017-01-26 DIAGNOSIS — R600 Localized edema: Secondary | ICD-10-CM

## 2017-01-26 DIAGNOSIS — I10 Essential (primary) hypertension: Secondary | ICD-10-CM

## 2017-01-26 LAB — POCT URINALYSIS DIP (DEVICE)
BILIRUBIN URINE: NEGATIVE
Glucose, UA: NEGATIVE mg/dL
HGB URINE DIPSTICK: NEGATIVE
Ketones, ur: NEGATIVE mg/dL
Leukocytes, UA: NEGATIVE
NITRITE: NEGATIVE
PH: 6 (ref 5.0–8.0)
Protein, ur: NEGATIVE mg/dL
SPECIFIC GRAVITY, URINE: 1.025 (ref 1.005–1.030)
Urobilinogen, UA: 0.2 mg/dL (ref 0.0–1.0)

## 2017-01-26 LAB — POCT GLYCOSYLATED HEMOGLOBIN (HGB A1C): Hemoglobin A1C: 5.9

## 2017-01-26 LAB — POCT RAPID STREP A (OFFICE): Rapid Strep A Screen: NEGATIVE

## 2017-01-26 MED ORDER — FUROSEMIDE 20 MG PO TABS: 20.0000 mg | ORAL_TABLET | Freq: Every day | ORAL | 5 refills | Status: DC

## 2017-01-26 MED ORDER — CEPHALEXIN 500 MG PO CAPS: 500.0000 mg | ORAL_CAPSULE | Freq: Four times a day (QID) | ORAL | 0 refills | Status: AC

## 2017-01-26 MED ORDER — METFORMIN HCL 500 MG PO TABS: 500.0000 mg | ORAL_TABLET | Freq: Every day | ORAL | 5 refills | Status: DC

## 2017-01-26 NOTE — Progress Notes (Signed)
Subjective:    Patient ID: Christie Walker, female    DOB: [birth year], 46 y.o.   MRN: [id number]  HPI  Christie Walker, a 45 year old female with a history of morbid obesity, hypertension and prediabetes presents with puncture wound of the right foot. Patient sustained a puncture wound 2 days ago after stepping on the door of her dog's cage. She describes wound as red and painful. Current pain intensity is 4/10. She has been cleaning with soap and water and applying Neosporin. Patient is up to date with tetanus vaccination.  She has a history of prediabetes. She has been taking Metformin 500 mg with breakfast consistently. She does not exercise routinely or follow a carbohydrate modified diet. She denies fatigue, polyuria, polydipsia, or polyphagia.   She also has a history of hypertension. She endorses periodic bilateral lower extremity edema. She continues to take Furosemide consistently.  Cardiovascular risk factors include: obesity (BMI >= 30 kg/m2). Past Medical History:  Diagnosis Date  . Acid reflux   . Bipolar 1 disorder (HCC)   . Essential hypertension 06/14/2016   Social History   Social History  . Marital status: Legally Separated    Spouse name: N/A  . Number of children: N/A  . Years of education: N/A   Occupational History  . Not on file.   Social History Main Topics  . Smoking status: Never Smoker  . Smokeless tobacco: Never Used  . Alcohol use No  . Drug use: No  . Sexual activity: Not on file   Other Topics Concern  . Not on file   Social History Narrative  . No narrative on file    Immunization History  Administered Date(s) Administered  . Tdap 08/04/2016    Review of Systems  Constitutional: Positive for fatigue.  HENT: Positive for congestion, postnasal drip and sore throat.   Respiratory: Negative.   Cardiovascular: Negative for chest pain, palpitations and leg swelling.  Gastrointestinal: Negative.   Endocrine: Negative.    Genitourinary: Negative.   Musculoskeletal: Negative.   Skin: Negative.        Objective:   Physical Exam  HENT:  Head: Normocephalic.  Right Ear: Hearing and tympanic membrane normal.  Left Ear: Hearing and tympanic membrane normal.  Mouth/Throat: Posterior oropharyngeal erythema present.  Skin:  Nondraining, erythematous, 0.5 cm, tender to palpation         BP 111/71 (BP Location: Right Arm, Patient Position: Sitting, Cuff Size: Large)   Pulse 76   Temp 98.6 F (37 C) (Oral)   Resp 16   Ht 5' 4.5" (1.638 m)   Wt (!) 322 lb (146.1 kg)   LMP 12/21/2016   SpO2 98%   BMI 54.42 kg/m   Assessment & Plan:  1. Puncture wound of right foot, initial encounter Will treat with Keflex prophylaxis.  Patient is up to date with tetanus vaccination   - cephALEXin (KEFLEX) 500 MG capsule; Take 1 capsule (500 mg total) by mouth 4 (four) times daily.  Dispense: 28 capsule; Refill: 0  2. Prediabetes Hemoglobin a1c is stable at 5.9. Recommend a lowfat, low carbohydrate diet divided over 5-6 small meals, increase water intake to 6-8 glasses, and 150 minutes per week of cardiovascular exercise.    - HgB A1c - POCT urinalysis dip (device) - metFORMIN (GLUCOPHAGE) 500 MG tablet; Take 1 tablet (500 mg total) by mouth daily with breakfast.  Dispense: 90 tablet; Refill: 5  3. Essential hypertension Blood pressure is at goal, no medication changes  warranted - POCT urinalysis dip (device) - BASIC METABOLIC PANEL WITH GFR  4. Morbid obesity with BMI of 50.0-59.9, adult (HCC) Recommend a lowfat, low carbohydrate diet divided over 5-6 small meals, increase water intake to 6-8 glasses, and 150 minutes per week of cardiovascular exercise.    5. Bilateral lower extremity edema - furosemide (LASIX) 20 MG tablet; Take 1 tablet (20 mg total) by mouth daily.  Dispense: 30 tablet; Refill: 5  6. Sore throat Recommend Tylenol 500 mg every 6 hours as needed for mild to moderate pain Warm salt  water gargles as needed Increase rest, handwashing, and fluid intake - Rapid Strep A   The patient was given clear instructions to go to ER or return to medical center if symptoms do not improve, worsen or new problems develop. The patient verbalized understanding. Will notify patient with laboratory results.   Nolon Nations  MSN, FNP-C Patient Care [facility] [address] [place], [place] [address] [phone]

## 2017-01-26 NOTE — Patient Instructions (Addendum)
Increase fluid intake, rest, and handwashing.  Tylenol 500 mg every 6 hours as needed for fever Warm, salt water gargles for your throat Increase vitamin C intake.   Right foot puncture wound.  Keep clean and dry.  Elevate foot to heart level while at rest.  Keep open to air at home Will start an antibiotic prophylaxis. Keflex 500 mg 4 times per day for 5 day.     Your A1C goal is less than 7. Your fasting blood sugar  Upon awakening goal is between 110-140.  Your LDL  (bad cholesterol goal is less than 100 Blood pressure goal is <140/90.  Recommend a lowfat, low carbohydrate diet divided over 5-6 small meals, increase water intake to 6-8 glasses, and 150 minutes per week of cardiovascular exercise.   Take your medications as prescribed Make sure that you are familiar with each one of your medications and what they are used to treat.  If you are unsure of medications, please bring to follow up Will send referral for eye exam  Please keep your scheduled follow up appointment.

## 2017-01-27 LAB — BASIC METABOLIC PANEL WITH GFR
BUN: 10 mg/dL (ref 7–25)
CALCIUM: 9.2 mg/dL (ref 8.6–10.2)
CHLORIDE: 105 mmol/L (ref 98–110)
CO2: 24 mmol/L (ref 20–32)
Creat: 0.87 mg/dL (ref 0.50–1.10)
GFR, Est African American: 94 mL/min/{1.73_m2} (ref >=60)
GFR, Est Non African American: 81 mL/min/{1.73_m2} (ref >=60)
Glucose, Bld: 101 mg/dL — ABNORMAL HIGH (ref 65–99)
Potassium: 3.8 mmol/L (ref 3.5–5.3)
Sodium: 140 mmol/L (ref 135–146)

## 2017-04-24 ENCOUNTER — Telehealth: Payer: Self-pay | Admitting: Family Medicine

## 2017-04-24 DIAGNOSIS — R7303 Prediabetes: Secondary | ICD-10-CM

## 2017-04-24 MED ORDER — METFORMIN HCL 500 MG PO TABS: 500.0000 mg | ORAL_TABLET | Freq: Every day | ORAL | 5 refills | Status: DC

## 2017-04-24 NOTE — Telephone Encounter (Signed)
This has been refilled and sent to [facility]. Thanks!

## 2017-04-24 NOTE — Telephone Encounter (Signed)
Patient needs refill on Metformin. [facility] [place] Follow up appt r/s to 05/04/17. Please advise.

## 2017-04-27 ENCOUNTER — Ambulatory Visit: Payer: BLUE CROSS/BLUE SHIELD | Admitting: Family Medicine

## 2017-05-04 ENCOUNTER — Encounter: Payer: Self-pay | Admitting: Family Medicine

## 2017-05-04 ENCOUNTER — Ambulatory Visit: Payer: BLUE CROSS/BLUE SHIELD | Admitting: Family Medicine

## 2017-05-04 VITALS — BP 116/53 | HR 78 | Temp 98.7°F | Resp 16 | Ht 64.5 in | Wt 306.0 lb

## 2017-05-04 DIAGNOSIS — R600 Localized edema: Secondary | ICD-10-CM | POA: Diagnosis not present

## 2017-05-04 DIAGNOSIS — R7303 Prediabetes: Secondary | ICD-10-CM

## 2017-05-04 DIAGNOSIS — I10 Essential (primary) hypertension: Secondary | ICD-10-CM

## 2017-05-04 DIAGNOSIS — Z6841 Body Mass Index (BMI) 40.0 and over, adult: Secondary | ICD-10-CM

## 2017-05-04 LAB — POCT GLYCOSYLATED HEMOGLOBIN (HGB A1C): Hemoglobin A1C: 5.6

## 2017-05-04 NOTE — Patient Instructions (Signed)
Prediabetes Eating Plan Prediabetes-also called impaired glucose tolerance or impaired fasting glucose-is a condition that causes blood sugar (blood glucose) levels to be higher than normal. Following a healthy diet can help to keep prediabetes under control. It can also help to lower the risk of type 2 diabetes and heart disease, which are increased in people who have prediabetes. Along with regular exercise, a healthy diet:  Promotes weight loss.  Helps to control blood sugar levels.  Helps to improve the way that the body uses insulin.  What do I need to know about this eating plan?  Use the glycemic index (GI) to plan your meals. The index tells you how quickly a food will raise your blood sugar. Choose low-GI foods. These foods take a longer time to raise blood sugar.  Pay close attention to the amount of carbohydrates in the food that you eat. Carbohydrates increase blood sugar levels.  Keep track of how many calories you take in. Eating the right amount of calories will help you to achieve a healthy weight. Losing about 7 percent of your starting weight can help to prevent type 2 diabetes.  You may want to follow a Mediterranean diet. This diet includes a lot of vegetables, lean meats or fish, whole grains, fruits, and healthy oils and fats. What foods can I eat? Grains Whole grains, such as whole-wheat or whole-grain breads, crackers, cereals, and pasta. Unsweetened oatmeal. Bulgur. Barley. Quinoa. Brown rice. Corn or whole-wheat flour tortillas or taco shells. Vegetables Lettuce. Spinach. Peas. Beets. Cauliflower. Cabbage. Broccoli. Carrots. Tomatoes. Squash. Eggplant. Herbs. Peppers. Onions. Cucumbers. Brussels sprouts. Fruits Berries. Bananas. Apples. Oranges. Grapes. Papaya. Mango. Pomegranate. Kiwi. Grapefruit. Cherries. Meats and Other Protein Sources Seafood. Lean meats, such as chicken and turkey or lean cuts of pork and beef. Tofu. Eggs. Nuts. Beans. Dairy Low-fat or  fat-free dairy products, such as yogurt, cottage cheese, and cheese. Beverages Water. Tea. Coffee. Sugar-free or diet soda. Seltzer water. Milk. Milk alternatives, such as soy or almond milk. Condiments Mustard. Relish. Low-fat, low-sugar ketchup. Low-fat, low-sugar barbecue sauce. Low-fat or fat-free mayonnaise. Sweets and Desserts Sugar-free or low-fat pudding. Sugar-free or low-fat ice cream and other frozen treats. Fats and Oils Avocado. Walnuts. Olive oil. The items listed above may not be a complete list of recommended foods or beverages. Contact your dietitian for more options. What foods are not recommended? Grains Refined white flour and flour products, such as bread, pasta, snack foods, and cereals. Beverages Sweetened drinks, such as sweet iced tea and soda. Sweets and Desserts Baked goods, such as cake, cupcakes, pastries, cookies, and cheesecake. The items listed above may not be a complete list of foods and beverages to avoid. Contact your dietitian for more information. This information is not intended to replace advice given to you by your health care provider. Make sure you discuss any questions you have with your health care provider. Document Released: 09/09/2014 Document Revised: 10/01/2015 Document Reviewed: 05/21/2014 Elsevier Interactive Patient Education  2017 Elsevier Inc.  

## 2017-05-04 NOTE — Progress Notes (Signed)
Subjective:    Patient ID: Christie Walker, female    DOB: 06/16/1970, 46 y.o.   MRN: [id number]  HPI Christie Walker, a 46 year old female with a history of hypertension, morbid obesity, and lower extremity edema presents for a 6 month follow up of chronic conditions. Patient says that she has not been exercising routinely and does not follow a carbohydrate modified diet consistently. She was recently sent for a bariatric consult. She says that she missed follow up appointment. Christie Walker is no longer interested in bariatric surgery. Patient denies foot ulcerations, increase appetite, nausea, paresthesia of the feet, polydipsia, polyuria and visual disturbances.  She also has a history of hypertension.She continues to take furosemide for bilateral lower extremity edema with moderate relief.  She is not exercising and is not adherent to low salt diet.  She does not check blood pressures at home. Cardiovascular risk factors include: obesity (BMI >= 30 kg/m2) and sedentary lifestyle.   Past Medical History:  Diagnosis Date  . Acid reflux   . Bipolar 1 disorder (HCC)   . Essential hypertension 06/14/2016   Social History   Socioeconomic History  . Marital status: Legally Separated    Spouse name: Not on file  . Number of children: Not on file  . Years of education: Not on file  . Highest education level: Not on file  Social Needs  . Financial resource strain: Not on file  . Food insecurity - worry: Not on file  . Food insecurity - inability: Not on file  . Transportation needs - medical: Not on file  . Transportation needs - non-medical: Not on file  Occupational History  . Not on file  Tobacco Use  . Smoking status: Never Smoker  . Smokeless tobacco: Never Used  Substance and Sexual Activity  . Alcohol use: No  . Drug use: No  . Sexual activity: Not on file  Other Topics Concern  . Not on file  Social History Narrative  . Not on file   Review of Systems  HENT:  Negative.   Eyes: Negative.   Respiratory: Negative.   Cardiovascular: Negative.  Negative for leg swelling.  Endocrine: Negative for polydipsia, polyphagia and polyuria.  Genitourinary: Negative.   Musculoskeletal: Negative.   Skin: Negative.   Allergic/Immunologic: Negative for immunocompromised state.  Neurological: Negative.   Hematological: Negative.   Psychiatric/Behavioral: Negative.        Objective:   Physical Exam  Constitutional: She is oriented to person, place, and time. She appears well-developed and well-nourished.  Morbid obesity  HENT:  Head: Normocephalic and atraumatic.  Right Ear: External ear normal.  Mouth/Throat: Oropharynx is clear and moist.  Eyes: Conjunctivae and EOM are normal. Pupils are equal, round, and reactive to light.  Neck: Normal range of motion. Neck supple.  Pulmonary/Chest: Effort normal and breath sounds normal.  Abdominal: Soft. Bowel sounds are normal.  Abdomen pendulous   Musculoskeletal: Normal range of motion.  Neurological: She is alert and oriented to person, place, and time. She has normal reflexes.  Skin: Skin is warm and dry.  Psychiatric: She has a normal mood and affect. Her speech is normal and behavior is normal. Judgment and thought content normal.      BP (!) 116/53 (BP Location: Left Arm, Patient Position: Sitting, Cuff Size: Large)   Pulse 78   Temp 98.7 F (37.1 C) (Oral)   Resp 16   Ht 5' 4.5" (1.638 m)   Wt (!) 306 lb (  138.8 kg)   LMP 04/07/2017   SpO2 98%   BMI 51.71 kg/m  Assessment & Plan:  1. Prediabetes Hemoglobin a1C has decreased to 5.6 on Metformin 500 mg with breakfast.  The patient is asked to make an attempt to improve diet and exercise patterns to aid in medical management of this problem.  - HgB A1c  2. Morbid obesity with BMI of 50.0-59.9, adult (HCC) Body mass index is 51.71 kg/m. Weight had decreased by 36 pounds since June. Recommend that patient continue to decrease portions.  Recommend a lowfat, low carbohydrate diet divided over 5-6 small meals, increase water intake to 6-8 glasses, and 150 minutes per week of cardiovascular exercise.    Filed Weights   05/04/17 1454  Weight: (!) 306 lb (138.8 kg)    3. Lower extremity edema Continue Furosemide 20 mg daily. Will check potassium level on today.  Elevate lower extremities to heart level while at rest.   4. Essential hypertension Blood pressure is at goal.  - Continue medication, monitor blood pressure consistentl. Reminder to go to the ER if any CP, SOB, nausea, dizziness, severe HA, changes vision/speech, left arm numbness and tingling and jaw pain. - Basic Metabolic Panel   RTC: 6 months for CPE   Nolon NationsLachina Moore Kaiah Hosea  MSN, FNP-C Patient [facility] [address]  [place], [place] [id number] [phone]

## 2017-05-05 ENCOUNTER — Telehealth: Payer: Self-pay

## 2017-05-05 LAB — BASIC METABOLIC PANEL
BUN/Creatinine Ratio: 15 (ref 9–23)
BUN: 11 mg/dL (ref 6–24)
CALCIUM: 9.7 mg/dL (ref 8.7–10.2)
CHLORIDE: 104 mmol/L (ref 96–106)
CO2: 25 mmol/L (ref 20–29)
Creatinine, Ser: 0.71 mg/dL (ref 0.57–1.00)
GFR calc Af Amer: 119 mL/min/{1.73_m2} (ref >59)
GFR calc non Af Amer: 103 mL/min/{1.73_m2} (ref >59)
Glucose: 94 mg/dL (ref 65–99)
Potassium: 3.7 mmol/L (ref 3.5–5.2)
SODIUM: 141 mmol/L (ref 134–144)

## 2017-05-05 NOTE — Telephone Encounter (Signed)
-----   Message from LachinMassie Maroona M Hollis, [place]NP sent at 05/05/2017  5:56 AM EST ----- Regarding: lab results Please inform patient that all labs are within normal limits. She is to follow up in 6 months as scheduled.    Thanks

## 2017-05-05 NOTE — Telephone Encounter (Signed)
Called and spoke with patient, advised of normal labs and to follow up in 6 months as scheduled. Patient verbalized understanding. Thanks!

## 2017-07-24 ENCOUNTER — Other Ambulatory Visit: Payer: Self-pay

## 2017-07-24 ENCOUNTER — Telehealth: Payer: Self-pay

## 2017-07-24 DIAGNOSIS — R600 Localized edema: Secondary | ICD-10-CM

## 2017-07-24 DIAGNOSIS — R7303 Prediabetes: Secondary | ICD-10-CM

## 2017-07-24 MED ORDER — FUROSEMIDE 20 MG PO TABS: 20.0000 mg | ORAL_TABLET | Freq: Every day | ORAL | 5 refills | Status: DC

## 2017-07-24 MED ORDER — METFORMIN HCL 500 MG PO TABS
500.0000 mg | ORAL_TABLET | Freq: Every day | ORAL | 5 refills | Status: DC
Start: 2017-07-24 — End: 2017-07-24

## 2017-07-24 MED ORDER — METFORMIN HCL 500 MG PO TABS: 500.0000 mg | ORAL_TABLET | Freq: Every day | ORAL | 5 refills | Status: DC

## 2017-07-24 NOTE — Telephone Encounter (Signed)
Refills sent into pharmacy. Thanks!  

## 2017-11-02 ENCOUNTER — Ambulatory Visit: Payer: BLUE CROSS/BLUE SHIELD | Admitting: Family Medicine

## 2017-12-08 ENCOUNTER — Ambulatory Visit (INDEPENDENT_AMBULATORY_CARE_PROVIDER_SITE_OTHER): Payer: BLUE CROSS/BLUE SHIELD | Admitting: Family Medicine

## 2017-12-08 VITALS — BP 111/72 | HR 71 | Temp 98.5°F | Resp 16 | Ht 64.5 in | Wt 291.0 lb

## 2017-12-08 DIAGNOSIS — R600 Localized edema: Secondary | ICD-10-CM | POA: Diagnosis not present

## 2017-12-08 DIAGNOSIS — R7303 Prediabetes: Secondary | ICD-10-CM | POA: Diagnosis not present

## 2017-12-08 DIAGNOSIS — E669 Obesity, unspecified: Secondary | ICD-10-CM

## 2017-12-08 DIAGNOSIS — B379 Candidiasis, unspecified: Secondary | ICD-10-CM

## 2017-12-08 DIAGNOSIS — I10 Essential (primary) hypertension: Secondary | ICD-10-CM

## 2017-12-08 DIAGNOSIS — E8881 Metabolic syndrome: Secondary | ICD-10-CM

## 2017-12-08 LAB — POCT URINALYSIS DIPSTICK
Bilirubin, UA: NEGATIVE
Blood, UA: NEGATIVE
Glucose, UA: NEGATIVE
Ketones, UA: NEGATIVE
Nitrite, UA: NEGATIVE
Protein, UA: NEGATIVE
Spec Grav, UA: 1.025 (ref 1.010–1.025)
Urobilinogen, UA: 0.2 E.U./dL
pH, UA: 6.5 (ref 5.0–8.0)

## 2017-12-08 LAB — POCT GLYCOSYLATED HEMOGLOBIN (HGB A1C): Hemoglobin A1C: 5.3 % (ref 4.0–5.6)

## 2017-12-08 MED ORDER — NYSTATIN-TRIAMCINOLONE 100000-0.1 UNIT/GM-% EX OINT: 1.0000 "application " | TOPICAL_OINTMENT | Freq: Two times a day (BID) | CUTANEOUS | 0 refills | Status: DC

## 2017-12-08 MED ORDER — FLUCONAZOLE 150 MG PO TABS: 150.0000 mg | ORAL_TABLET | Freq: Once | ORAL | 0 refills | Status: AC

## 2017-12-08 MED ORDER — METFORMIN HCL 500 MG PO TABS: 500.0000 mg | ORAL_TABLET | Freq: Every day | ORAL | 5 refills | Status: DC

## 2017-12-08 MED ORDER — FUROSEMIDE 20 MG PO TABS: 20.0000 mg | ORAL_TABLET | Freq: Every day | ORAL | 5 refills | Status: DC

## 2017-12-08 NOTE — Progress Notes (Signed)
Subjective   Christie Walker 47 y.o. female  [id number]  [id number]  1971-03-10 47 y.o.     Chief Complaint  Patient presents with  . Hypertension  . Rash    itching rash under right breast   . Follow-up    prediabetes    Rash  This is a new problem. The current episode started 1 to 4 weeks ago. The problem is unchanged. Location: right breast. The rash is characterized by redness and itchiness. Associated with: sweat. Pertinent negatives include no shortness of breath. Past treatments include nothing. There is no history of eczema.  Hypertension  This is a chronic problem. The current episode started more than 1 year ago. The problem is unchanged. The problem is controlled. Pertinent negatives include no palpitations, peripheral edema or shortness of breath. There are no associated agents to hypertension. Risk factors for coronary artery disease include dyslipidemia, family history and sedentary lifestyle. Past treatments include diuretics. The current treatment provides moderate improvement. Compliance problems include diet and exercise.     Past Medical History:  Diagnosis Date  . Acid reflux   . Bipolar 1 disorder (HCC)   . Essential hypertension 06/14/2016    Social History   Socioeconomic History  . Marital status: Legally Separated    Spouse name: Not on file  . Number of children: Not on file  . Years of education: Not on file  . Highest education level: Not on file  Occupational History  . Not on file  Social Needs  . Financial resource strain: Not on file  . Food insecurity:    Worry: Not on file    Inability: Not on file  . Transportation needs:    Medical: Not on file    Non-medical: Not on file  Tobacco Use  . Smoking status: Never Smoker  . Smokeless tobacco: Never Used  Substance and Sexual Activity  . Alcohol use: No  . Drug use: No  . Sexual activity: Not on file  Lifestyle  . Physical activity:    Days per week: Not on file     Minutes per session: Not on file  . Stress: Not on file  Relationships  . Social connections:    Talks on phone: Not on file    Gets together: Not on file    Attends religious service: Not on file    Active member of club or organization: Not on file    Attends meetings of clubs or organizations: Not on file    Relationship status: Not on file  . Intimate partner violence:    Fear of current or ex partner: Not on file    Emotionally abused: Not on file    Physically abused: Not on file    Forced sexual activity: Not on file  Other Topics Concern  . Not on file  Social History Narrative  . Not on file      Review of Systems  Constitutional: Negative.   HENT: Negative.   Eyes: Negative.   Respiratory: Negative.  Negative for shortness of breath.   Cardiovascular: Negative.  Negative for palpitations.  Gastrointestinal: Negative.   Genitourinary: Negative.   Musculoskeletal: Negative.   Skin: Positive for rash.  Neurological: Negative.   Psychiatric/Behavioral: Negative.     Objective   Physical Exam  Constitutional: She is oriented to person, place, and time. She appears well-developed and well-nourished. No distress.  HENT:  Head: Normocephalic and atraumatic.  Eyes: Pupils are equal, round, and reactive  to light. Conjunctivae and EOM are normal.  Neck: Normal range of motion. Neck supple. No JVD present. No thyromegaly (mild enlargement) present.  Cardiovascular: Normal rate, regular rhythm, normal heart sounds and intact distal pulses. Exam reveals no gallop and no friction rub.  No murmur heard. Pulmonary/Chest: Effort normal and breath sounds normal. No respiratory distress. She has no wheezes. She has no rales. She exhibits no tenderness.  Abdominal: Soft. Bowel sounds are normal. She exhibits no distension and no mass. There is no tenderness.  Musculoskeletal: Normal range of motion. Edema: bilateral LE non-pitting.  Lymphadenopathy:    She has no cervical  adenopathy.  Neurological: She is alert and oriented to person, place, and time.  Skin: Skin is warm and dry. Rash (erythematous rash under the right breast. No exudate. flat lesions. ) noted.  Psychiatric: She has a normal mood and affect. Her behavior is normal. Judgment and thought content normal.  Nursing note and vitals reviewed.   BP 111/72 (BP Location: Left Arm, Patient Position: Sitting, Cuff Size: Large)   Pulse 71   Temp 98.5 F (36.9 C) (Oral)   Resp 16   Ht 5' 4.5" (1.638 m)   Wt 291 lb (132 kg)   LMP 12/01/2017   SpO2 98%   BMI 49.18 kg/m   Assessment   Encounter Diagnosis  Name Primary?  . Prediabetes Yes     Plan  1. Prediabetes The current medical regimen is effective;  continue present plan and medications. - HgB A1c - metFORMIN (GLUCOPHAGE) 500 MG tablet; Take 1 tablet (500 mg total) by mouth daily with breakfast.  Dispense: 90 tablet; Refill: 5  2. Bilateral lower extremity edema  - furosemide (LASIX) 20 MG tablet; Take 1 tablet (20 mg total) by mouth daily.  Dispense: 30 tablet; Refill: 5  3. Essential hypertension  - metFORMIN (GLUCOPHAGE) 500 MG tablet; Take 1 tablet (500 mg total) by mouth daily with breakfast.  Dispense: 90 tablet; Refill: 5 - furosemide (LASIX) 20 MG tablet; Take 1 tablet (20 mg total) by mouth daily.  Dispense: 30 tablet; Refill: 5 - Comprehensive metabolic panel - Urinalysis Dipstick  4. Obesity, unspecified classification, unspecified obesity type, unspecified whether serious comorbidity present Encouraged diet and exercise.   5. Metabolic syndrome The current medical regimen is effective;  continue present plan and medications.  6. Candidiasis - fluconazole (DIFLUCAN) 150 MG tablet; Take 1 tablet (150 mg total) by mouth once for 1 dose.  Dispense: 1 tablet; Refill: 0 - nystatin-triamcinolone ointment (MYCOLOG); Apply 1 application topically 2 (two) times daily.  Dispense: 30 g; Refill: 0   Ms. Andr L. Riley Lamouglas,  FNP-BC [facility] [address]  [place], [place] [id number] [phone]    This note has been created with Dragon speech recognition software and smart phrase technology. Any transcriptional errors are unintentional.

## 2017-12-08 NOTE — Patient Instructions (Signed)
Skin Yeast Infection Skin yeast infection is a condition in which there is an overgrowth of yeast (candida) that normally lives on the skin. This condition usually occurs in areas of the skin that are constantly warm and moist, such as the armpits or the groin. What are the causes? This condition is caused by a change in the normal balance of the yeast and bacteria that live on the skin. What increases the risk? This condition is more likely to develop in:  People who are obese.  Pregnant women.  Women who take birth control pills.  People who have diabetes.  People who take antibiotic medicines.  People who take steroid medicines.  People who are malnourished.  People who have a weak defense (immune) system.  People who are 65 years of age or older.  What are the signs or symptoms? Symptoms of this condition include:  A red, swollen area of the skin.  Bumps on the skin.  Itchiness.  How is this diagnosed? This condition is diagnosed with a medical history and physical exam. Your health care provider may check for yeast by taking light scrapings of the skin to be viewed under a microscope. How is this treated? This condition is treated with medicine. Medicines may be prescribed or be available over-the-counter. The medicines may be:  Taken by mouth (orally).  Applied as a cream.  Follow these instructions at home:  Take or apply over-the-counter and prescription medicines only as told by your health care provider.  Eat more yogurt. This may help to keep your yeast infection from returning.  Maintain a healthy weight. If you need help losing weight, talk with your health care provider.  Keep your skin clean and dry.  If you have diabetes, keep your blood sugar under control. Contact a health care provider if:  Your symptoms go away and then return.  Your symptoms do not get better with treatment.  Your symptoms get worse.  Your rash spreads.  You have a  fever or chills.  You have new symptoms.  You have new warmth or redness of your skin. This information is not intended to replace advice given to you by your health care provider. Make sure you discuss any questions you have with your health care provider. Document Released: 01/11/2011 Document Revised: 12/20/2015 Document Reviewed: 10/27/2014 Elsevier Interactive Patient Education  2018 Elsevier Inc.  

## 2017-12-09 LAB — COMPREHENSIVE METABOLIC PANEL
ALT: 17 IU/L (ref 0–32)
AST: 15 IU/L (ref 0–40)
Albumin/Globulin Ratio: 1.3 (ref 1.2–2.2)
Albumin: 3.9 g/dL (ref 3.5–5.5)
Alkaline Phosphatase: 78 IU/L (ref 39–117)
BUN/Creatinine Ratio: 14 (ref 9–23)
BUN: 10 mg/dL (ref 6–24)
Bilirubin Total: <0.2 mg/dL (ref 0.0–1.2)
CO2: 26 mmol/L (ref 20–29)
Calcium: 9.7 mg/dL (ref 8.7–10.2)
Chloride: 102 mmol/L (ref 96–106)
Creatinine, Ser: 0.73 mg/dL (ref 0.57–1.00)
GFR calc Af Amer: 115 mL/min/{1.73_m2} (ref >59)
GFR calc non Af Amer: 100 mL/min/{1.73_m2} (ref >59)
Globulin, Total: 2.9 g/dL (ref 1.5–4.5)
Glucose: 90 mg/dL (ref 65–99)
Potassium: 3.7 mmol/L (ref 3.5–5.2)
Sodium: 140 mmol/L (ref 134–144)
Total Protein: 6.8 g/dL (ref 6.0–8.5)

## 2018-06-07 ENCOUNTER — Other Ambulatory Visit: Payer: Self-pay | Admitting: Family Medicine

## 2018-06-07 DIAGNOSIS — I10 Essential (primary) hypertension: Secondary | ICD-10-CM

## 2018-06-07 DIAGNOSIS — R600 Localized edema: Secondary | ICD-10-CM

## 2018-06-15 ENCOUNTER — Ambulatory Visit (INDEPENDENT_AMBULATORY_CARE_PROVIDER_SITE_OTHER): Payer: BLUE CROSS/BLUE SHIELD | Admitting: Family Medicine

## 2018-06-15 ENCOUNTER — Ambulatory Visit (HOSPITAL_COMMUNITY)
Admission: RE | Admit: 2018-06-15 | Discharge: 2018-06-15 | Disposition: A | Discharge status: Home / Self Care | Payer: BLUE CROSS/BLUE SHIELD | Source: Ambulatory Visit | Attending: Family Medicine | Admitting: Family Medicine

## 2018-06-15 ENCOUNTER — Encounter: Payer: Self-pay | Admitting: Family Medicine

## 2018-06-15 VITALS — BP 110/58 | HR 76 | Temp 98.4°F | Resp 16 | Ht 64.5 in | Wt 301.0 lb

## 2018-06-15 DIAGNOSIS — E569 Vitamin deficiency, unspecified: Secondary | ICD-10-CM

## 2018-06-15 DIAGNOSIS — R809 Proteinuria, unspecified: Secondary | ICD-10-CM

## 2018-06-15 DIAGNOSIS — R7303 Prediabetes: Secondary | ICD-10-CM | POA: Diagnosis not present

## 2018-06-15 DIAGNOSIS — I10 Essential (primary) hypertension: Secondary | ICD-10-CM

## 2018-06-15 DIAGNOSIS — M545 Low back pain, unspecified: Secondary | ICD-10-CM

## 2018-06-15 DIAGNOSIS — M25561 Pain in right knee: Secondary | ICD-10-CM

## 2018-06-15 LAB — POCT URINALYSIS DIPSTICK
Glucose, UA: NEGATIVE
Ketones, UA: NEGATIVE
Leukocytes, UA: NEGATIVE
Nitrite, UA: NEGATIVE
Protein, UA: POSITIVE — AB
Spec Grav, UA: >=1.03 — AB (ref 1.010–1.025)
Urobilinogen, UA: 0.2 E.U./dL
pH, UA: 5.5 (ref 5.0–8.0)

## 2018-06-15 LAB — POCT GLYCOSYLATED HEMOGLOBIN (HGB A1C): Hemoglobin A1C: 5.2 % (ref 4.0–5.6)

## 2018-06-15 MED ORDER — NAPROXEN 500 MG PO TABS: 500.0000 mg | ORAL_TABLET | Freq: Two times a day (BID) | ORAL | 0 refills | Status: DC

## 2018-06-15 NOTE — Progress Notes (Signed)
[facility]   Progress Note: General Provider: Mike GipAndre Nahara Dona, FNP  SUBJECTIVE:   Christie Walker is a 48 y.o. female who  has a past medical history of Acid reflux, Bipolar 1 disorder (HCC), and Essential hypertension (06/14/2016).. Patient presents today for Knee Pain and Follow-up (pre diabets )   Patient with right knee pain that has become worse in the past month. She has not taken anything for the pain. Has not used a brace or support. She aslo has a hx of pre-diabetes. She is not eating a heart heatlhy or carb modified diet. She is not exercisisng on a regular basis. Non smoker  Review of Systems  Constitutional: Negative.   HENT: Negative.   Eyes: Negative.   Respiratory: Negative.   Cardiovascular: Negative.   Gastrointestinal: Negative.   Genitourinary: Negative.   Musculoskeletal: Positive for joint pain (knee pain).  Skin: Negative.   Neurological: Negative.   Psychiatric/Behavioral: Negative.      OBJECTIVE: BP (!) 110/58 (BP Location: Left Arm, Patient Position: Sitting, Cuff Size: Large)   Pulse 76   Temp 98.4 F (36.9 C) (Oral)   Resp 16   Ht 5' 4.5" (1.638 m)   Wt (!) 301 lb (136.5 kg)   LMP 06/15/2018   SpO2 98%   BMI 50.87 kg/m   Wt Readings from Last 3 Encounters:  06/15/18 (!) 301 lb (136.5 kg)  12/08/17 291 lb (132 kg)  05/04/17 (!) 306 lb (138.8 kg)     Physical Exam Vitals signs and nursing note reviewed.  Constitutional:      General: She is not in acute distress.    Appearance: She is well-developed.  HENT:     Head: Normocephalic and atraumatic.  Eyes:     Conjunctiva/sclera: Conjunctivae normal.     Pupils: Pupils are equal, round, and reactive to light.  Neck:     Musculoskeletal: Normal range of motion.  Cardiovascular:     Rate and Rhythm: Normal rate and regular rhythm.     Heart sounds: Normal heart sounds.  Pulmonary:     Effort: Pulmonary effort is normal. No  respiratory distress.     Breath sounds: Normal breath sounds.  Abdominal:     General: Bowel sounds are normal. There is no distension.     Palpations: Abdomen is soft.  Musculoskeletal: Normal range of motion.        General: Swelling (mild right knee. no crepitus. full ROM) present.  Skin:    General: Skin is warm and dry.  Neurological:     Mental Status: She is alert and oriented to person, place, and time.  Psychiatric:        Behavior: Behavior normal.        Thought Content: Thought content normal.     ASSESSMENT/PLAN:   1. Essential hypertension - Urinalysis Dipstick - Microalbumin, urine - Comprehensive metabolic panel  2. Prediabetes The patient is asked to make an attempt to improve diet and exercise patterns to aid in medical management of this problem.  - HgB A1c  3. Proteinuria, unspecified type - Microalbumin, urine  4. Acute pain of right knee Xrays pending.  - DG Knee Complete 4 Views Right; Future  5. Acute right-sided low back pain without sciatica Pending labs. Will adjust medications accordingly.    - naproxen (NAPROSYN) 500 MG tablet; Take 1 tablet (500 mg total) by mouth 2 (two) times daily with a meal.  Dispense: 30 tablet; Refill:  0  Encoraged weight loss through diet and exercise. Explained that being obese can place strain on back and joints.   Return in about 6 months (around 12/14/2018), or if symptoms worsen or fail to improve, for pre-diabetes.    The patient was given clear instructions to go to ER or return to medical center if symptoms do not improve, worsen or new problems develop. The patient verbalized understanding and agreed with plan of care.   Ms. Freda Jackson. Riley Lam, FNP-BC Patient [facility] Center [facility] [address] [place], [place] [address] [phone]

## 2018-06-15 NOTE — Patient Instructions (Signed)

## 2018-06-16 LAB — COMPREHENSIVE METABOLIC PANEL
ALT: 19 IU/L (ref 0–32)
AST: 15 IU/L (ref 0–40)
Albumin/Globulin Ratio: 1.5 (ref 1.2–2.2)
Albumin: 4 g/dL (ref 3.8–4.8)
Alkaline Phosphatase: 83 IU/L (ref 39–117)
BUN/Creatinine Ratio: 14 (ref 9–23)
BUN: 11 mg/dL (ref 6–24)
Bilirubin Total: <0.2 mg/dL (ref 0.0–1.2)
CO2: 20 mmol/L (ref 20–29)
Calcium: 9.3 mg/dL (ref 8.7–10.2)
Chloride: 105 mmol/L (ref 96–106)
Creatinine, Ser: 0.8 mg/dL (ref 0.57–1.00)
GFR calc Af Amer: 102 mL/min/{1.73_m2} (ref >59)
GFR calc non Af Amer: 89 mL/min/{1.73_m2} (ref >59)
Globulin, Total: 2.7 g/dL (ref 1.5–4.5)
Glucose: 102 mg/dL — ABNORMAL HIGH (ref 65–99)
Potassium: 4 mmol/L (ref 3.5–5.2)
Sodium: 140 mmol/L (ref 134–144)
Total Protein: 6.7 g/dL (ref 6.0–8.5)

## 2018-06-16 LAB — VITAMIN D 25 HYDROXY (VIT D DEFICIENCY, FRACTURES): Vit D, 25-Hydroxy: 27.3 ng/mL — ABNORMAL LOW (ref 30.0–100.0)

## 2018-06-16 LAB — MICROALBUMIN, URINE: Microalbumin, Urine: 156.1 ug/mL

## 2018-06-20 ENCOUNTER — Telehealth: Payer: Self-pay

## 2018-06-20 NOTE — Telephone Encounter (Signed)
-----   Message from Mike Gip, FNP sent at 06/19/2018  8:22 PM EST ----- Please let the patient know that the X-ray showed osteoarthritis. If she would like a referral to ortho please let me know.

## 2018-06-20 NOTE — Telephone Encounter (Signed)
Please place ortho order. Thanks!

## 2018-06-20 NOTE — Telephone Encounter (Signed)
Patinet called back and I advised of Low vitamin D and that rx has been sent to pharamcy for this for her to pick up and start taking as directed. I also advised that xray shows osteoarthritis and patient verbalized understanding and does want to be sent to ortho. Thanks!

## 2018-06-20 NOTE — Telephone Encounter (Signed)
Called, no answer. Left a message to call back. Thanks!        Please let patient know that her labs were stable except low vitamin D levels. I will send a prescription to the pharmacy for this.

## 2018-06-21 ENCOUNTER — Telehealth: Payer: Self-pay

## 2018-06-21 NOTE — Telephone Encounter (Signed)
Called, no answer and no voicemail. Will try later.  

## 2018-06-26 ENCOUNTER — Other Ambulatory Visit: Payer: Self-pay | Admitting: Family Medicine

## 2018-06-26 DIAGNOSIS — M25561 Pain in right knee: Secondary | ICD-10-CM

## 2018-06-28 ENCOUNTER — Other Ambulatory Visit: Payer: Self-pay | Admitting: Family Medicine

## 2018-06-28 DIAGNOSIS — M545 Low back pain, unspecified: Secondary | ICD-10-CM

## 2018-07-05 ENCOUNTER — Encounter: Payer: Self-pay | Admitting: Family Medicine

## 2018-07-17 ENCOUNTER — Other Ambulatory Visit: Payer: Self-pay | Admitting: Family Medicine

## 2018-07-17 DIAGNOSIS — M545 Low back pain, unspecified: Secondary | ICD-10-CM

## 2018-07-19 ENCOUNTER — Ambulatory Visit: Payer: BLUE CROSS/BLUE SHIELD | Admitting: Sports Medicine

## 2018-08-08 ENCOUNTER — Other Ambulatory Visit: Payer: Self-pay | Admitting: Family Medicine

## 2018-08-08 DIAGNOSIS — M545 Low back pain, unspecified: Secondary | ICD-10-CM

## 2018-08-21 ENCOUNTER — Other Ambulatory Visit: Payer: Self-pay | Admitting: Family Medicine

## 2018-08-21 DIAGNOSIS — M545 Low back pain, unspecified: Secondary | ICD-10-CM

## 2018-09-05 ENCOUNTER — Other Ambulatory Visit: Payer: Self-pay | Admitting: Family Medicine

## 2018-09-05 DIAGNOSIS — M545 Low back pain, unspecified: Secondary | ICD-10-CM

## 2018-09-13 NOTE — Telephone Encounter (Signed)
Message sent to provider 

## 2018-09-20 ENCOUNTER — Other Ambulatory Visit: Payer: Self-pay | Admitting: Family Medicine

## 2018-09-20 DIAGNOSIS — M545 Low back pain, unspecified: Secondary | ICD-10-CM

## 2018-12-06 ENCOUNTER — Other Ambulatory Visit: Payer: Self-pay | Admitting: Family Medicine

## 2018-12-06 DIAGNOSIS — R600 Localized edema: Secondary | ICD-10-CM

## 2018-12-06 DIAGNOSIS — I10 Essential (primary) hypertension: Secondary | ICD-10-CM

## 2018-12-14 ENCOUNTER — Other Ambulatory Visit: Payer: Self-pay

## 2018-12-14 ENCOUNTER — Encounter: Payer: Self-pay | Admitting: Family Medicine

## 2018-12-14 ENCOUNTER — Ambulatory Visit (INDEPENDENT_AMBULATORY_CARE_PROVIDER_SITE_OTHER): Payer: BLUE CROSS/BLUE SHIELD | Admitting: Family Medicine

## 2018-12-14 VITALS — BP 110/73 | HR 83 | Temp 98.6°F | Resp 16 | Ht 64.5 in | Wt 301.0 lb

## 2018-12-14 DIAGNOSIS — I10 Essential (primary) hypertension: Secondary | ICD-10-CM

## 2018-12-14 DIAGNOSIS — R7303 Prediabetes: Secondary | ICD-10-CM | POA: Diagnosis not present

## 2018-12-14 DIAGNOSIS — E569 Vitamin deficiency, unspecified: Secondary | ICD-10-CM | POA: Diagnosis not present

## 2018-12-14 LAB — POCT GLYCOSYLATED HEMOGLOBIN (HGB A1C): Hemoglobin A1C: 5.6 % (ref 4.0–5.6)

## 2018-12-14 LAB — POCT URINALYSIS DIPSTICK
Bilirubin, UA: NEGATIVE
Blood, UA: NEGATIVE
Glucose, UA: NEGATIVE
Ketones, UA: NEGATIVE
Leukocytes, UA: NEGATIVE
Nitrite, UA: NEGATIVE
Protein, UA: POSITIVE — AB
Spec Grav, UA: 1.025 (ref 1.010–1.025)
Urobilinogen, UA: 0.2 E.U./dL
pH, UA: 6.5 (ref 5.0–8.0)

## 2018-12-14 NOTE — Progress Notes (Signed)
  [facility]   Progress Note: General Provider: Lanae Boast, FNP  SUBJECTIVE:   Christie Walker is a 48 y.o. female who  has a past medical history of Acid reflux, Bipolar 1 disorder ([place]), and Essential hypertension (06/14/2016).. Patient presents today for Hypertension and Follow-up (prediabetes )  Patient reports compliance with all medications.  Patient denies side effects of medications.  She has a history of vitamin d deficiency and would like to have levels checked today. She denies problems or concerns. She is not following a low sodium/ carb modified diet. Not exercising on a regular basis. Her weight is stable.   Review of Systems  Constitutional: Negative.   HENT: Negative.   Eyes: Negative.   Respiratory: Negative.   Cardiovascular: Negative.   Gastrointestinal: Negative.   Genitourinary: Negative.   Musculoskeletal: Negative.   Skin: Negative.   Neurological: Negative.   Psychiatric/Behavioral: Negative.      OBJECTIVE: BP 110/73 (BP Location: Left Arm, Patient Position: Sitting, Cuff Size: Large)   Pulse 83   Temp 98.6 F (37 C) (Oral)   Resp 16   Ht 5' 4.5" (1.638 m)   Wt (!) 301 lb (136.5 kg)   LMP 11/28/2018   SpO2 98%   BMI 50.87 kg/m   Wt Readings from Last 3 Encounters:  12/14/18 (!) 301 lb (136.5 kg)  06/15/18 (!) 301 lb (136.5 kg)  12/08/17 291 lb (132 kg)     Physical Exam Vitals signs and nursing note reviewed.  Constitutional:      General: She is not in acute distress.    Appearance: Normal appearance.  HENT:     Head: Normocephalic and atraumatic.  Eyes:     Extraocular Movements: Extraocular movements intact.     Conjunctiva/sclera: Conjunctivae normal.     Pupils: Pupils are equal, round, and reactive to light.  Cardiovascular:     Rate and Rhythm: Normal rate and regular rhythm.     Heart sounds: No murmur.  Pulmonary:     Effort: Pulmonary effort is normal.     Breath  sounds: Normal breath sounds.  Musculoskeletal: Normal range of motion.  Skin:    General: Skin is warm and dry.  Neurological:     Mental Status: She is alert and oriented to person, place, and time.  Psychiatric:        Mood and Affect: Mood normal.        Behavior: Behavior normal.        Thought Content: Thought content normal.        Judgment: Judgment normal.     ASSESSMENT/PLAN:  1. Essential hypertension No medication changes warranted at the present time.   - Urinalysis Dipstick  2. Prediabetes The patient is asked to make an attempt to improve diet and exercise patterns to aid in medical management of this problem.  - HgB A1c  3. Vitamin deficiency Lab ordered. Sent in script for vitamind  - Vitamin D, 25-hydroxy     Return in about 6 months (around 06/16/2019) for htn.    The patient was given clear instructions to go to ER or return to medical center if symptoms do not improve, worsen or new problems develop. The patient verbalized understanding and agreed with plan of care.   Ms. Christie Walker. Christie Canary, FNP-BC [facility] [address] [place], [place] [address] [phone]

## 2018-12-14 NOTE — Patient Instructions (Signed)
Hypertension, Adult Hypertension is another name for high blood pressure. High blood pressure forces your heart to work harder to pump blood. This can cause problems over time. There are two numbers in a blood pressure reading. There is a top number (systolic) over a bottom number (diastolic). It is best to have a blood pressure that is below 120/80. Healthy choices can help lower your blood pressure, or you may need medicine to help lower it. What are the causes? The cause of this condition is not known. Some conditions may be related to high blood pressure. What increases the risk?  Smoking.  Having type 2 diabetes mellitus, high cholesterol, or both.  Not getting enough exercise or physical activity.  Being overweight.  Having too much fat, sugar, calories, or salt (sodium) in your diet.  Drinking too much alcohol.  Having long-term (chronic) kidney disease.  Having a family history of high blood pressure.  Age. Risk increases with age.  Race. You may be at higher risk if you are African American.  Gender. Men are at higher risk than women before age 45. After age 65, women are at higher risk than men.  Having obstructive sleep apnea.  Stress. What are the signs or symptoms?  High blood pressure may not cause symptoms. Very high blood pressure (hypertensive crisis) may cause: ? Headache. ? Feelings of worry or nervousness (anxiety). ? Shortness of breath. ? Nosebleed. ? A feeling of being sick to your stomach (nausea). ? Throwing up (vomiting). ? Changes in how you see. ? Very bad chest pain. ? Seizures. How is this treated?  This condition is treated by making healthy lifestyle changes, such as: ? Eating healthy foods. ? Exercising more. ? Drinking less alcohol.  Your health care provider may prescribe medicine if lifestyle changes are not enough to get your blood pressure under control, and if: ? Your top number is above 130. ? Your bottom number is above 80.   Your personal target blood pressure may vary. Follow these instructions at home: Eating and drinking   If told, follow the DASH eating plan. To follow this plan: ? Fill one half of your plate at each meal with fruits and vegetables. ? Fill one fourth of your plate at each meal with whole grains. Whole grains include whole-wheat pasta, brown rice, and whole-grain bread. ? Eat or drink low-fat dairy products, such as skim milk or low-fat yogurt. ? Fill one fourth of your plate at each meal with low-fat (lean) proteins. Low-fat proteins include fish, chicken without skin, eggs, beans, and tofu. ? Avoid fatty meat, cured and processed meat, or chicken with skin. ? Avoid pre-made or processed food.  Eat less than 1,500 mg of salt each day.  Do not drink alcohol if: ? Your doctor tells you not to drink. ? You are pregnant, may be pregnant, or are planning to become pregnant.  If you drink alcohol: ? Limit how much you use to:  0-1 drink a day for women.  0-2 drinks a day for men. ? Be aware of how much alcohol is in your drink. In the [place] one drink equals one 12 oz bottle of beer (355 mL), one 5 oz glass of wine (148 mL), or one 1 oz glass of hard liquor (44 mL). Lifestyle   Work with your doctor to stay at a healthy weight or to lose weight. Ask your doctor what the best weight is for you.  Get at least 30 minutes of exercise most   days of the week. This may include walking, swimming, or biking.  Get at least 30 minutes of exercise that strengthens your muscles (resistance exercise) at least 3 days a week. This may include lifting weights or doing Pilates.  Do not use any products that contain nicotine or tobacco, such as cigarettes, e-cigarettes, and chewing tobacco. If you need help quitting, ask your doctor.  Check your blood pressure at home as told by your doctor.  Keep all follow-up visits as told by your doctor. This is important. Medicines  Take over-the-counter and  prescription medicines only as told by your doctor. Follow directions carefully.  Do not skip doses of blood pressure medicine. The medicine does not work as well if you skip doses. Skipping doses also puts you at risk for problems.  Ask your doctor about side effects or reactions to medicines that you should watch for. Contact a doctor if you:  Think you are having a reaction to the medicine you are taking.  Have headaches that keep coming back (recurring).  Feel dizzy.  Have swelling in your ankles.  Have trouble with your vision. Get help right away if you:  Get a very bad headache.  Start to feel mixed up (confused).  Feel weak or numb.  Feel faint.  Have very bad pain in your: ? Chest. ? Belly (abdomen).  Throw up more than once.  Have trouble breathing. Summary  Hypertension is another name for high blood pressure.  High blood pressure forces your heart to work harder to pump blood.  For most people, a normal blood pressure is less than 120/80.  Making healthy choices can help lower blood pressure. If your blood pressure does not get lower with healthy choices, you may need to take medicine. This information is not intended to replace advice given to you by your health care provider. Make sure you discuss any questions you have with your health care provider. Document Released: 10/12/2007 Document Revised: 01/03/2018 Document Reviewed: 01/03/2018 Elsevier Patient Education  2020 [employer]. Prediabetes Eating Plan Prediabetes is a condition that causes blood sugar (glucose) levels to be higher than normal. This increases the risk for developing diabetes. In order to prevent diabetes from developing, your health care provider may recommend a diet and other lifestyle changes to help you:  Control your blood glucose levels.  Improve your cholesterol levels.  Manage your blood pressure. Your health care provider may recommend working with a diet and nutrition  specialist (dietitian) to make a meal plan that is best for you. What are tips for following this plan? Lifestyle  Set weight loss goals with the help of your health care team. It is recommended that most people with prediabetes lose 7% of their current body weight.  Exercise for at least 30 minutes at least 5 days a week.  Attend a support group or seek ongoing support from a mental health counselor.  Take over-the-counter and prescription medicines only as told by your health care provider. Reading food labels  Read food labels to check the amount of fat, salt (sodium), and sugar in prepackaged foods. Avoid foods that have: ? Saturated fats. ? Trans fats. ? Added sugars.  Avoid foods that have more than 300 milligrams (mg) of sodium per serving. Limit your daily sodium intake to less than 2,300 mg each day. Shopping  Avoid buying pre-made and processed foods. Cooking  Cook with olive oil. Do not use butter, lard, or ghee.  Bake, broil, grill, or boil foods.  Avoid frying. Meal planning   Work with your dietitian to develop an eating plan that is right for you. This may include: ? Tracking how many calories you take in. Use a food diary, notebook, or mobile application to track what you eat at each meal. ? Using the glycemic index (GI) to plan your meals. The index tells you how quickly a food will raise your blood glucose. Choose low-GI foods. These foods take a longer time to raise blood glucose.  Consider following a Mediterranean diet. This diet includes: ? Several servings each day of fresh fruits and vegetables. ? Eating fish at least twice a week. ? Several servings each day of whole grains, beans, nuts, and seeds. ? Using olive oil instead of other fats. ? Moderate alcohol consumption. ? Eating small amounts of red meat and whole-fat dairy.  If you have high blood pressure, you may need to limit your sodium intake or follow a diet such as the DASH eating plan. DASH  is an eating plan that aims to lower high blood pressure. What foods are recommended? The items listed below may not be a complete list. Talk with your dietitian about what dietary choices are best for you. Grains Whole grains, such as whole-wheat or whole-grain breads, crackers, cereals, and pasta. Unsweetened oatmeal. Bulgur. Barley. Quinoa. Brown rice. Corn or whole-wheat flour tortillas or taco shells. Vegetables Lettuce. Spinach. Peas. Beets. Cauliflower. Cabbage. Broccoli. Carrots. Tomatoes. Squash. Eggplant. Herbs. Peppers. Onions. Cucumbers. Brussels sprouts. Fruits Berries. Bananas. Apples. Oranges. Grapes. Papaya. Mango. Pomegranate. Kiwi. Grapefruit. Cherries. Meats and other protein foods Seafood. Poultry without skin. Lean cuts of pork and beef. Tofu. Eggs. Nuts. Beans. Dairy Low-fat or fat-free dairy products, such as yogurt, cottage cheese, and cheese. Beverages Water. Tea. Coffee. Sugar-free or diet soda. Seltzer water. Lowfat or no-fat milk. Milk alternatives, such as soy or almond milk. Fats and oils Olive oil. Canola oil. Sunflower oil. Grapeseed oil. Avocado. Walnuts. Sweets and desserts Sugar-free or low-fat pudding. Sugar-free or low-fat ice cream and other frozen treats. Seasoning and other foods Herbs. Sodium-free spices. Mustard. Relish. Low-fat, low-sugar ketchup. Low-fat, low-sugar barbecue sauce. Low-fat or fat-free mayonnaise. What foods are not recommended? The items listed below may not be a complete list. Talk with your dietitian about what dietary choices are best for you. Grains Refined white flour and flour products, such as bread, pasta, snack foods, and cereals. Vegetables Canned vegetables. Frozen vegetables with butter or cream sauce. Fruits Fruits canned with syrup. Meats and other protein foods Fatty cuts of meat. Poultry with skin. Breaded or fried meat. Processed meats. Dairy Full-fat yogurt, cheese, or milk. Beverages Sweetened drinks,  such as sweet iced tea and soda. Fats and oils Butter. Lard. Ghee. Sweets and desserts Baked goods, such as cake, cupcakes, pastries, cookies, and cheesecake. Seasoning and other foods Spice mixes with added salt. Ketchup. Barbecue sauce. Mayonnaise. Summary  To prevent diabetes from developing, you may need to make diet and other lifestyle changes to help control blood sugar, improve cholesterol levels, and manage your blood pressure.  Set weight loss goals with the help of your health care team. It is recommended that most people with prediabetes lose 7 percent of their current body weight.  Consider following a Mediterranean diet that includes plenty of fresh fruits and vegetables, whole grains, beans, nuts, seeds, fish, lean meat, low-fat dairy, and healthy oils. This information is not intended to replace advice given to you by your health care provider. Make sure you discuss any  questions you have with your health care provider. Document Released: 09/09/2014 Document Revised: 08/17/2018 Document Reviewed: 06/29/2016 Elsevier Patient Education  2020 [employer].

## 2018-12-15 LAB — VITAMIN D 25 HYDROXY (VIT D DEFICIENCY, FRACTURES): Vit D, 25-Hydroxy: 25.3 ng/mL — ABNORMAL LOW (ref 30.0–100.0)

## 2018-12-17 ENCOUNTER — Telehealth: Payer: Self-pay

## 2018-12-17 MED ORDER — VITAMIN D (ERGOCALCIFEROL) 1.25 MG (50000 UNIT) PO CAPS: 50000.0000 [IU] | ORAL_CAPSULE | ORAL | 0 refills | Status: DC

## 2018-12-17 NOTE — Telephone Encounter (Signed)
Called and spoke with patient, advised that vitamin D levels are low and to start vitamin D once weekly as prescribed until completed. Asked that she take otc vitamin d 1000 units daily thereafter. Patient verbalized understanding. Thanks!

## 2018-12-17 NOTE — Progress Notes (Signed)
Low Vitamin D. All other labs are stable. I will send Vit D to the pharmacy. After completion of the prescription, patient will need otc vitamin D of 1,000units daily.   

## 2018-12-17 NOTE — Telephone Encounter (Signed)
-----   Message from Lanae Boast, [place] sent at 12/17/2018  9:10 AM EDT ----- Low Vitamin D. All other labs are stable. I will send Vit D to the pharmacy. After completion of the prescription, patient will need otc vitamin D of 1,000units daily.

## 2019-01-11 ENCOUNTER — Other Ambulatory Visit: Payer: Self-pay

## 2019-01-11 DIAGNOSIS — I10 Essential (primary) hypertension: Secondary | ICD-10-CM

## 2019-01-11 DIAGNOSIS — R7303 Prediabetes: Secondary | ICD-10-CM

## 2019-01-11 MED ORDER — METFORMIN HCL 500 MG PO TABS: 500.0000 mg | ORAL_TABLET | Freq: Every day | ORAL | 5 refills | Status: DC

## 2019-01-16 ENCOUNTER — Encounter (HOSPITAL_COMMUNITY): Payer: Self-pay

## 2019-01-16 ENCOUNTER — Encounter (HOSPITAL_COMMUNITY): Payer: Self-pay | Admitting: *Deleted

## 2019-02-11 ENCOUNTER — Other Ambulatory Visit: Payer: Self-pay | Admitting: Family Medicine

## 2019-04-08 ENCOUNTER — Other Ambulatory Visit: Payer: Self-pay

## 2019-04-08 MED ORDER — VITAMIN D (ERGOCALCIFEROL) 1.25 MG (50000 UNIT) PO CAPS: ORAL_CAPSULE | ORAL | 0 refills | Status: DC

## 2019-06-03 ENCOUNTER — Other Ambulatory Visit: Payer: Self-pay

## 2019-06-03 MED ORDER — VITAMIN D (ERGOCALCIFEROL) 1.25 MG (50000 UNIT) PO CAPS: ORAL_CAPSULE | ORAL | 0 refills | Status: DC

## 2019-06-05 ENCOUNTER — Other Ambulatory Visit: Payer: Self-pay | Admitting: Family Medicine

## 2019-06-05 DIAGNOSIS — I10 Essential (primary) hypertension: Secondary | ICD-10-CM

## 2019-06-05 DIAGNOSIS — R600 Localized edema: Secondary | ICD-10-CM

## 2019-06-14 ENCOUNTER — Encounter: Payer: Self-pay | Admitting: Nurse Practitioner

## 2019-06-14 ENCOUNTER — Ambulatory Visit (INDEPENDENT_AMBULATORY_CARE_PROVIDER_SITE_OTHER): Payer: BLUE CROSS/BLUE SHIELD | Admitting: Nurse Practitioner

## 2019-06-14 ENCOUNTER — Other Ambulatory Visit: Payer: Self-pay

## 2019-06-14 VITALS — BP 117/74 | HR 71 | Temp 98.7°F | Resp 16 | Ht 64.5 in | Wt 314.0 lb

## 2019-06-14 DIAGNOSIS — I10 Essential (primary) hypertension: Secondary | ICD-10-CM

## 2019-06-14 DIAGNOSIS — D508 Other iron deficiency anemias: Secondary | ICD-10-CM

## 2019-06-14 DIAGNOSIS — E7800 Pure hypercholesterolemia, unspecified: Secondary | ICD-10-CM

## 2019-06-14 DIAGNOSIS — Z0000 Encounter for general adult medical examination without abnormal findings: Secondary | ICD-10-CM

## 2019-06-14 DIAGNOSIS — Z6841 Body Mass Index (BMI) 40.0 and over, adult: Secondary | ICD-10-CM

## 2019-06-14 DIAGNOSIS — K5900 Constipation, unspecified: Secondary | ICD-10-CM

## 2019-06-14 DIAGNOSIS — R7303 Prediabetes: Secondary | ICD-10-CM

## 2019-06-14 LAB — POCT URINALYSIS DIPSTICK
Bilirubin, UA: NEGATIVE
Blood, UA: NEGATIVE
Glucose, UA: NEGATIVE
Ketones, UA: NEGATIVE
Leukocytes, UA: NEGATIVE
Nitrite, UA: NEGATIVE
Protein, UA: NEGATIVE
Spec Grav, UA: 1.025 (ref 1.010–1.025)
Urobilinogen, UA: 0.2 E.U./dL
pH, UA: 6.5 (ref 5.0–8.0)

## 2019-06-14 LAB — POCT GLYCOSYLATED HEMOGLOBIN (HGB A1C): Hemoglobin A1C: 5.4 % (ref 4.0–5.6)

## 2019-06-14 NOTE — Progress Notes (Signed)
Established Patient Office Visit  Subjective:  Patient ID: Christie Walker, female    DOB: 1970-10-04  Age: 49 y.o. MRN: [id number]  CC:  Chief Complaint  Patient presents with  . Hypertension    HPI [name] presents for follow up.  Hypertension, metabolic syndrome morbid obesity, GERD.  She admits that she is doing well overall.  She denies any concerns today.Denies headache, dizziness, visual changes, shortness of breath, dyspnea on exertion, chest pain, nausea, vomiting or any edema.     Past Medical History:  Diagnosis Date  . Acid reflux   . Bipolar 1 disorder ([place])   . Essential hypertension 06/14/2016    History reviewed. No pertinent surgical history.  Family History  Problem Relation Age of Onset  . Mental illness Other     Social History   Socioeconomic History  . Marital status: Legally Separated    Spouse name: Not on file  . Number of children: Not on file  . Years of education: Not on file  . Highest education level: Not on file  Occupational History  . Not on file  Tobacco Use  . Smoking status: Never Smoker  . Smokeless tobacco: Never Used  Substance and Sexual Activity  . Alcohol use: No  . Drug use: No  . Sexual activity: Not on file  Other Topics Concern  . Not on file  Social History Narrative  . Not on file   Social Determinants of Health   Financial Resource Strain:   . Difficulty of Paying Living Expenses: Not on file  Food Insecurity:   . Worried About Charity fundraiser in the Last Year: Not on file  . Ran Out of Food in the Last Year: Not on file  Transportation Needs:   . Lack of Transportation (Medical): Not on file  . Lack of Transportation (Non-Medical): Not on file  Physical Activity:   . Days of Exercise per Week: Not on file  . Minutes of Exercise per Session: Not on file  Stress:   . Feeling of Stress : Not on file  Social Connections:   . Frequency of Communication with Friends and Family: Not  on file  . Frequency of Social Gatherings with Friends and Family: Not on file  . Attends Religious Services: Not on file  . Active Member of Clubs or Organizations: Not on file  . Attends Archivist Meetings: Not on file  . Marital Status: Not on file  Intimate Partner Violence:   . Fear of Current or Ex-Partner: Not on file  . Emotionally Abused: Not on file  . Physically Abused: Not on file  . Sexually Abused: Not on file    Outpatient Medications Prior to Visit  Medication Sig Dispense Refill  . albuterol (PROVENTIL HFA;VENTOLIN HFA) 108 (90 Base) MCG/ACT inhaler Inhale 2 puffs into the lungs every 6 (six) hours as needed for wheezing or shortness of breath.    . fluticasone (FLONASE) 50 MCG/ACT nasal spray Place 1 spray into both nostrils daily.    . furosemide (LASIX) 20 MG tablet TAKE 1 TABLET(20 MG) BY MOUTH DAILY 30 tablet 5  . gabapentin (NEURONTIN) 100 MG capsule Take 100 mg by mouth 2 (two) times daily.     . IRON PO Take 1 tablet by mouth daily.    Marland Kitchen loratadine-pseudoephedrine (CLARITIN-D 24-HOUR) 10-240 MG 24 hr tablet Take 1 tablet by mouth daily as needed for allergies.     Marland Kitchen lurasidone (LATUDA) 40 MG  TABS tablet Take 40 mg by mouth daily.    . metFORMIN (GLUCOPHAGE) 500 MG tablet Take 1 tablet (500 mg total) by mouth daily with breakfast. 90 tablet 5  . Vitamin D, Ergocalciferol, (DRISDOL) 1.25 MG (50000 UNIT) CAPS capsule TAKE 1 CAPSULE BY MOUTH EVERY 7 DAYS 8 capsule 0  . naproxen (NAPROSYN) 500 MG tablet TAKE 1 TABLET(500 MG) BY MOUTH TWICE DAILY WITH A MEAL (Patient not taking: Reported on 06/14/2019) 30 tablet 0  . nystatin-triamcinolone ointment (MYCOLOG) Apply 1 application topically 2 (two) times daily. (Patient not taking: Reported on 06/15/2018) 30 g 0   No facility-administered medications prior to visit.    Allergies  Allergen Reactions  . Bee Venom Swelling    Swelling at site   . Amoxicillin Hives and Other (See Comments)    Patient states that  she can take this she just has a sensitivity when its taken along with pain medication  . Hydrocodone Hives and Other (See Comments)    Sensitivity when taken along with antibiotics per patient  . Insect Extract Allergy Skin Test Other (See Comments)    Spiders - make tongue burn  . Propoxyphene N-Acetaminophen Nausea And Vomiting and Rash    ROS Review of Systems  Constitutional: Negative.   HENT: Negative.   Eyes: Negative.   Respiratory: Negative.   Cardiovascular: Negative.   Gastrointestinal: Positive for constipation.  Genitourinary: Negative.   Musculoskeletal: Negative.   Skin: Negative.   Allergic/Immunologic: Negative.   Neurological: Negative.   Hematological: Negative.       Objective:    Physical Exam  Constitutional: She is oriented to person, place, and time. She appears well-developed and well-nourished.  HENT:  Head: Normocephalic.  Cardiovascular: Normal rate, regular rhythm and normal heart sounds.  Pulmonary/Chest: Effort normal.  Abdominal: Soft. Bowel sounds are normal.  Musculoskeletal:        General: Normal range of motion.     Cervical back: Normal range of motion and neck supple.  Neurological: She is alert and oriented to person, place, and time.  Skin: Skin is warm and dry.  Psychiatric: She has a normal mood and affect. Her behavior is normal. Judgment and thought content normal.    BP 117/74 (BP Location: Right Arm, Patient Position: Sitting, Cuff Size: Large)   Pulse 71   Temp 98.7 F (37.1 C) (Oral)   Resp 16   Ht 5' 4.5" (1.638 m)   Wt (!) 314 lb (142.4 kg)   SpO2 98%   BMI 53.07 kg/m  Wt Readings from Last 3 Encounters:  06/14/19 (!) 314 lb (142.4 kg)  12/14/18 (!) 301 lb (136.5 kg)  06/15/18 (!) 301 lb (136.5 kg)     There are no preventive care reminders to display for this patient.  There are no preventive care reminders to display for this patient.  Lab Results  Component Value Date   TSH 3.06 06/14/2016   Lab  Results  Component Value Date   WBC 6.2 06/14/2019   HGB 12.8 06/14/2019   HCT 37.9 06/14/2019   MCV 91 06/14/2019   PLT 275 06/14/2019   Lab Results  Component Value Date   NA 140 06/14/2019   K 4.2 06/14/2019   CO2 24 06/14/2019   GLUCOSE 105 (H) 06/14/2019   BUN 10 06/14/2019   CREATININE 0.81 06/14/2019   BILITOT <0.2 06/14/2019   ALKPHOS 94 06/14/2019   AST 19 06/14/2019   ALT 23 06/14/2019   PROT 7.0 06/14/2019  ALBUMIN 4.3 06/14/2019   CALCIUM 9.7 06/14/2019   Lab Results  Component Value Date   CHOL 202 (H) 06/14/2016   Lab Results  Component Value Date   HDL 44 (L) 06/14/2016   Lab Results  Component Value Date   LDLCALC 126 (H) 06/14/2016   Lab Results  Component Value Date   TRIG 158 (H) 06/14/2016   Lab Results  Component Value Date   CHOLHDL 4.6 06/14/2016   Lab Results  Component Value Date   HGBA1C 5.4 06/14/2019      Assessment & Plan:   Problem List Items Addressed This Visit      Unprioritized   Essential hypertension - Primary   Relevant Orders   Urinalysis Dipstick (Completed)   CMP and Liver (Completed)    Other Visit Diagnoses    Prediabetes       Relevant Orders   HgB A1c (Completed)   Body mass index (BMI) of 50-59.9 in adult [facility])       Other iron deficiency anemia       Relevant Orders   CBC with Differential/Platelet (Completed)   Healthcare maintenance       Elevated cholesterol       Relevant Orders   Lipid Panel   Constipation, unspecified constipation type       Dietary changes      No orders of the defined types were placed in this encounter.   Follow-up: Return in about 3 months (around 09/11/2019).    Barbette Merino, NP

## 2019-06-15 LAB — CMP AND LIVER
ALT: 23 IU/L (ref 0–32)
AST: 19 IU/L (ref 0–40)
Albumin: 4.3 g/dL (ref 3.8–4.8)
Alkaline Phosphatase: 94 IU/L (ref 39–117)
BUN: 10 mg/dL (ref 6–24)
Bilirubin Total: <0.2 mg/dL (ref 0.0–1.2)
Bilirubin, Direct: 0.06 mg/dL (ref 0.00–0.40)
CO2: 24 mmol/L (ref 20–29)
Calcium: 9.7 mg/dL (ref 8.7–10.2)
Chloride: 103 mmol/L (ref 96–106)
Creatinine, Ser: 0.81 mg/dL (ref 0.57–1.00)
GFR calc Af Amer: 100 mL/min/{1.73_m2} (ref >59)
GFR calc non Af Amer: 87 mL/min/{1.73_m2} (ref >59)
Glucose: 105 mg/dL — ABNORMAL HIGH (ref 65–99)
Potassium: 4.2 mmol/L (ref 3.5–5.2)
Sodium: 140 mmol/L (ref 134–144)
Total Protein: 7 g/dL (ref 6.0–8.5)

## 2019-06-15 LAB — CBC WITH DIFFERENTIAL/PLATELET
Basophils Absolute: 0 10*3/uL (ref 0.0–0.2)
Basos: 1 %
EOS (ABSOLUTE): 0.2 10*3/uL (ref 0.0–0.4)
Eos: 3 %
Hematocrit: 37.9 % (ref 34.0–46.6)
Hemoglobin: 12.8 g/dL (ref 11.1–15.9)
Immature Grans (Abs): 0 10*3/uL (ref 0.0–0.1)
Immature Granulocytes: 1 %
Lymphocytes Absolute: 2.6 10*3/uL (ref 0.7–3.1)
Lymphs: 43 %
MCH: 30.6 pg (ref 26.6–33.0)
MCHC: 33.8 g/dL (ref 31.5–35.7)
MCV: 91 fL (ref 79–97)
Monocytes Absolute: 0.4 10*3/uL (ref 0.1–0.9)
Monocytes: 6 %
Neutrophils Absolute: 2.9 10*3/uL (ref 1.4–7.0)
Neutrophils: 46 %
Platelets: 275 10*3/uL (ref 150–450)
RBC: 4.18 x10E6/uL (ref 3.77–5.28)
RDW: 13.5 % (ref 11.7–15.4)
WBC: 6.2 10*3/uL (ref 3.4–10.8)

## 2019-07-23 ENCOUNTER — Emergency Department (HOSPITAL_COMMUNITY)
Admission: EM | Admit: 2019-07-23 | Discharge: 2019-07-23 | Disposition: A | Discharge status: Home / Self Care | Payer: 59 | Attending: Emergency Medicine | Admitting: Emergency Medicine

## 2019-07-23 ENCOUNTER — Other Ambulatory Visit: Payer: Self-pay

## 2019-07-23 ENCOUNTER — Encounter (HOSPITAL_COMMUNITY): Payer: Self-pay | Admitting: Emergency Medicine

## 2019-07-23 DIAGNOSIS — Z79899 Other long term (current) drug therapy: Secondary | ICD-10-CM | POA: Diagnosis not present

## 2019-07-23 DIAGNOSIS — U071 COVID-19: Secondary | ICD-10-CM | POA: Insufficient documentation

## 2019-07-23 DIAGNOSIS — Z7984 Long term (current) use of oral hypoglycemic drugs: Secondary | ICD-10-CM | POA: Insufficient documentation

## 2019-07-23 DIAGNOSIS — I10 Essential (primary) hypertension: Secondary | ICD-10-CM | POA: Insufficient documentation

## 2019-07-23 DIAGNOSIS — R05 Cough: Secondary | ICD-10-CM | POA: Diagnosis present

## 2019-07-23 DIAGNOSIS — Z20822 Contact with and (suspected) exposure to covid-19: Secondary | ICD-10-CM

## 2019-07-23 LAB — SARS CORONAVIRUS 2 (TAT 6-24 HRS): SARS Coronavirus 2: POSITIVE — AB

## 2019-07-23 NOTE — ED Triage Notes (Signed)
Pt reports that she lives with her mother who has symptoms of no taste or smell. Reports last night at work started having cough and scratchy throat. Reports does have some post nasal drip at times.

## 2019-07-23 NOTE — Discharge Instructions (Signed)
You are being tested for COVID-19.  If your test is negative but your mother's is positive then you will still need to quarantine and assume that you have a virus.  I have given you a note for work. You may take over-the-counter medications for your symptoms including Tylenol and/or Motrin

## 2019-07-23 NOTE — ED Provider Notes (Signed)
[facility]-EMERGENCY DEPT Provider Note   CSN: [id number] Arrival date & time: 07/23/19  0321     History Chief Complaint  Patient presents with  . Cough    Christie Walker is a 49 y.o. female.  49 year old female with past medical history of hypertension, obesity, bipolar disorder presenting to the emergency department for evaluation.  Patient reports that her mother has been symptoms of upper respiratory infection and loss of taste and smell.  Reports that her mother is being evaluated in the emergency department here today.  Reports that last night she began to have some postnasal drip and a sore throat that she should be evaluated in case that she may have Covid or the flu.  Denies any coughing, shortness of breath, diarrhea, nausea, vomiting.        Past Medical History:  Diagnosis Date  . Acid reflux   . Bipolar 1 disorder (HCC)   . Essential hypertension 06/14/2016    Patient Active Problem List   Diagnosis Date Noted  . Chest pain at rest 08/05/2016  . Morbid obesity with BMI of 50.0-59.9, adult (HCC) 08/05/2016  . Dyspnea on exertion 08/05/2016  . Essential hypertension 06/14/2016  . Metabolic syndrome 06/14/2016  . Chronic knee pain 06/14/2016  . OBESITY 03/08/2007  . TMJ SYNDROME 03/08/2007  . GERD 03/08/2007    History reviewed. No pertinent surgical history.   OB History   No obstetric history on file.     Family History  Problem Relation Age of Onset  . Mental illness Other     Social History   Tobacco Use  . Smoking status: Never Smoker  . Smokeless tobacco: Never Used  Substance Use Topics  . Alcohol use: No  . Drug use: No    Home Medications Prior to Admission medications   Medication Sig Start Date End Date Taking? Authorizing Provider  albuterol (PROVENTIL HFA;VENTOLIN HFA) 108 (90 Base) MCG/ACT inhaler Inhale 2 puffs into the lungs every 6 (six) hours as needed for wheezing or shortness of breath.   Yes  [provider]  fluticasone (FLONASE) 50 MCG/ACT nasal spray Place 1 spray into both nostrils daily.   Yes [provider]  furosemide (LASIX) 20 MG tablet TAKE 1 TABLET(20 MG) BY MOUTH DAILY Patient taking differently: Take 20 mg by mouth daily.  06/05/19  Yes Quentin Angst, MD  gabapentin (NEURONTIN) 100 MG capsule Take 100 mg by mouth 2 (two) times daily.    Yes [provider]  IRON PO Take 1 tablet by mouth daily.   Yes [provider]  loratadine-pseudoephedrine (CLARITIN-D 24-HOUR) 10-240 MG 24 hr tablet Take 1 tablet by mouth daily as needed for allergies.    Yes [provider]  lurasidone (LATUDA) 40 MG TABS tablet Take 40 mg by mouth daily.   Yes [provider]  metFORMIN (GLUCOPHAGE) 500 MG tablet Take 1 tablet (500 mg total) by mouth daily with breakfast. 01/11/19  Yes Mike Gip, FNP  naproxen (NAPROSYN) 500 MG tablet TAKE 1 TABLET(500 MG) BY MOUTH TWICE DAILY WITH A MEAL Patient not taking: Reported on 06/14/2019 09/20/18   Mike Gip, FNP  nystatin-triamcinolone ointment [facility]) Apply 1 application topically 2 (two) times daily. Patient not taking: Reported on 06/15/2018 12/08/17   Mike Gip, FNP  Vitamin D, Ergocalciferol, (DRISDOL) 1.25 MG (50000 UNIT) CAPS capsule TAKE 1 CAPSULE BY MOUTH EVERY 7 DAYS 06/03/19   Quentin Angst, MD    Allergies  Bee venom, Amoxicillin, Hydrocodone, Insect extract allergy skin test, and Propoxyphene n-acetaminophen  Review of Systems   Review of Systems  Constitutional: Negative for appetite change, chills, fatigue and fever.  HENT: Positive for congestion, postnasal drip, rhinorrhea and sore throat. Negative for ear pain.   Eyes: Negative for pain and visual disturbance.  Respiratory: Negative for cough and shortness of breath.   Cardiovascular: Negative for chest pain and palpitations.  Gastrointestinal: Negative for abdominal pain, diarrhea, nausea and vomiting.   Genitourinary: Negative for dysuria and hematuria.  Musculoskeletal: Negative for arthralgias and back pain.  Skin: Negative for color change and rash.  Neurological: Negative for seizures and syncope.  All other systems reviewed and are negative.   Physical Exam Updated Vital Signs BP 99/79 (BP Location: Right Arm)   Pulse 78   Temp 98.3 F (36.8 C) (Oral)   Resp 18   SpO2 98%   Physical Exam Vitals and nursing note reviewed.  Constitutional:      Appearance: Normal appearance.  HENT:     Head: Normocephalic.     Nose: Nose normal.     Mouth/Throat:     Mouth: Mucous membranes are moist.  Eyes:     Conjunctiva/sclera: Conjunctivae normal.  Cardiovascular:     Rate and Rhythm: Normal rate and regular rhythm.  Pulmonary:     Effort: Pulmonary effort is normal.  Abdominal:     General: Abdomen is flat.     Palpations: Abdomen is soft.  Skin:    General: Skin is dry.  Neurological:     Mental Status: She is alert.  Psychiatric:        Mood and Affect: Mood normal.     ED Results / Procedures / Treatments   Labs (all labs ordered are listed, but only abnormal results are displayed) Labs Reviewed  SARS CORONAVIRUS 2 (TAT 6-24 HRS)    EKG None  Radiology No results found.  Procedures Procedures (including critical care time)  Medications Ordered in ED Medications - No data to display  ED Course  I have reviewed the triage vital signs and the nursing notes.  Pertinent labs & imaging results that were available during my care of the patient were reviewed by me and considered in my medical decision making (see chart for details).  Clinical Course as of Jul 23 1110  Tue Jul 23, 2019  1045 Patient with runny nose/congestion since yesterday. Concerned that her mother, whom she lives with, may have covid. Patient well appearing, normal vitals. Will do send out covid swab. Advised that even if negative, if her mother has covid sx she needs to quarantine/isloate  as a precaution. Advised on return precautions.    [KM]    Clinical Course User Index [KM] Jeral Pinch   MDM Rules/Calculators/A&P                      Based on review of vitals, medical screening exam, lab work and/or imaging, there does not appear to be an acute, emergent etiology for the patient's symptoms. Counseled pt on good return precautions and encouraged both PCP and ED follow-up as needed.  Prior to discharge, I also discussed incidental imaging findings with patient in detail and advised appropriate, recommended follow-up in detail.  Clinical Impression: 1. Suspected COVID-19 virus infection     Disposition: Discharge  Prior to providing a prescription for a controlled substance, I independently reviewed the patient's recent prescription history on the [place]  Controlled Substance Reporting System. The patient had no recent or regular prescriptions and was deemed appropriate for a brief, less than 3 day prescription of narcotic for acute analgesia.  This note was prepared with assistance of Systems analyst. Occasional wrong-word or sound-a-like substitutions may have occurred due to the inherent limitations of voice recognition software.  Final Clinical Impression(s) / ED Diagnoses Final diagnoses:  Suspected COVID-19 virus infection    Rx / DC Orders ED Discharge Orders    None       Kristine Royal 07/23/19 1112    Davonna Belling, MD 07/23/19 1459

## 2019-07-24 ENCOUNTER — Other Ambulatory Visit: Payer: Self-pay | Admitting: Unknown Physician Specialty

## 2019-07-24 ENCOUNTER — Telehealth: Payer: Self-pay | Admitting: Unknown Physician Specialty

## 2019-07-24 DIAGNOSIS — E669 Obesity, unspecified: Secondary | ICD-10-CM

## 2019-07-24 DIAGNOSIS — U071 COVID-19: Secondary | ICD-10-CM

## 2019-07-24 MED ORDER — SODIUM CHLORIDE 0.9 % IV SOLN
700.0000 mg | Freq: Once | INTRAVENOUS | Status: AC
  Administered 2019-07-25: 700 mg via INTRAVENOUS
  Filled 2019-07-24: qty 700

## 2019-07-24 NOTE — Telephone Encounter (Signed)
  I connected by phone with Christie Walker on 07/24/2019 at 4:11 PM to discuss the potential use of an new treatment for mild to moderate COVID-19 viral infection in non-hospitalized patients.  This patient is a 49 y.o. female that meets the FDA criteria for Emergency Use Authorization of bamlanivimab or casirivimab\imdevimab.  Has a (+) direct SARS-CoV-2 viral test result  Has mild or moderate COVID-19   Is ? 48 years of age and weighs ? 40 kg  Is NOT hospitalized due to COVID-19  Is NOT requiring oxygen therapy or requiring an increase in baseline oxygen flow rate due to COVID-19  Is within 10 days of symptom onset  Has at least one of the high risk factor(s) for progression to severe COVID-19 and/or hospitalization as defined in EUA.  Specific high risk criteria : BMI >/= 35   I have spoken and communicated the following to the patient or parent/caregiver:  1. FDA has authorized the emergency use of bamlanivimab and casirivimab\imdevimab for the treatment of mild to moderate COVID-19 in adults and pediatric patients with positive results of direct SARS-CoV-2 viral testing who are 18 years of age and older weighing at least 40 kg, and who are at high risk for progressing to severe COVID-19 and/or hospitalization.  2. The significant known and potential risks and benefits of bamlanivimab and casirivimab\imdevimab, and the extent to which such potential risks and benefits are unknown.  3. Information on available alternative treatments and the risks and benefits of those alternatives, including clinical trials.  4. Patients treated with bamlanivimab and casirivimab\imdevimab should continue to self-isolate and use infection control measures (e.g., wear mask, isolate, social distance, avoid sharing personal items, clean and disinfect "high touch" surfaces, and frequent handwashing) according to CDC guidelines.   5. The patient or parent/caregiver has the option to accept or refuse  bamlanivimab or casirivimab\imdevimab .  After reviewing this information with the patient, The patient agreed to proceed with receiving the bamlanimivab infusion and will be provided a copy of the Fact sheet prior to receiving the infusion. Gabriel Cirri 07/24/2019 4:11 PM  Sx 07/24/2019

## 2019-07-25 ENCOUNTER — Ambulatory Visit (HOSPITAL_COMMUNITY)
Admission: RE | Admit: 2019-07-25 | Discharge: 2019-07-25 | Disposition: A | Discharge status: Home / Self Care | Payer: 59 | Source: Ambulatory Visit | Attending: Pulmonary Disease | Admitting: Pulmonary Disease

## 2019-07-25 DIAGNOSIS — E669 Obesity, unspecified: Secondary | ICD-10-CM | POA: Insufficient documentation

## 2019-07-25 DIAGNOSIS — U071 COVID-19: Secondary | ICD-10-CM | POA: Insufficient documentation

## 2019-07-25 MED ORDER — DIPHENHYDRAMINE HCL 50 MG/ML IJ SOLN: 50.0000 mg | Freq: Once | INTRAMUSCULAR | Status: DC | PRN

## 2019-07-25 MED ORDER — ALBUTEROL SULFATE HFA 108 (90 BASE) MCG/ACT IN AERS: 2.0000 | INHALATION_SPRAY | Freq: Once | RESPIRATORY_TRACT | Status: DC | PRN

## 2019-07-25 MED ORDER — METHYLPREDNISOLONE SODIUM SUCC 125 MG IJ SOLR: 125.0000 mg | Freq: Once | INTRAMUSCULAR | Status: DC | PRN

## 2019-07-25 MED ORDER — EPINEPHRINE 0.3 MG/0.3ML IJ SOAJ: 0.3000 mg | Freq: Once | INTRAMUSCULAR | Status: DC | PRN

## 2019-07-25 MED ORDER — FAMOTIDINE IN NACL 20-0.9 MG/50ML-% IV SOLN: 20.0000 mg | Freq: Once | INTRAVENOUS | Status: DC | PRN

## 2019-07-25 MED ORDER — SODIUM CHLORIDE 0.9 % IV SOLN: INTRAVENOUS | Status: DC | PRN

## 2019-07-25 NOTE — Progress Notes (Signed)
  Diagnosis: COVID-19  Physician: Dr. Wright  Procedure: Cov[facility] Infusion Clinic Med: bamlanivimab infusion - Provided patient with bamlanimivab fact sheet for patients, parents and caregivers prior to infusion.  Complications: No immediate complications noted.  Discharge: Discharged home   Maurizio Geno S Vania Rosero 07/25/2019  

## 2019-07-25 NOTE — Discharge Instructions (Signed)

## 2019-07-29 ENCOUNTER — Other Ambulatory Visit: Payer: Self-pay

## 2019-07-29 MED ORDER — VITAMIN D (ERGOCALCIFEROL) 1.25 MG (50000 UNIT) PO CAPS: ORAL_CAPSULE | ORAL | 0 refills | Status: DC

## 2019-07-31 ENCOUNTER — Inpatient Hospital Stay (HOSPITAL_COMMUNITY)
Admission: EM | Admit: 2019-07-31 | Discharge: 2019-08-13 | DRG: 177 | Disposition: A | Discharge status: Home / Self Care | Payer: 59 | Attending: Internal Medicine | Admitting: Internal Medicine

## 2019-07-31 ENCOUNTER — Encounter (HOSPITAL_COMMUNITY): Payer: Self-pay | Admitting: *Deleted

## 2019-07-31 ENCOUNTER — Other Ambulatory Visit: Payer: Self-pay

## 2019-07-31 ENCOUNTER — Emergency Department (HOSPITAL_COMMUNITY): Payer: 59

## 2019-07-31 DIAGNOSIS — I071 Rheumatic tricuspid insufficiency: Secondary | ICD-10-CM | POA: Diagnosis present

## 2019-07-31 DIAGNOSIS — Z91030 Bee allergy status: Secondary | ICD-10-CM | POA: Diagnosis not present

## 2019-07-31 DIAGNOSIS — J9601 Acute respiratory failure with hypoxia: Secondary | ICD-10-CM | POA: Diagnosis not present

## 2019-07-31 DIAGNOSIS — K219 Gastro-esophageal reflux disease without esophagitis: Secondary | ICD-10-CM | POA: Diagnosis present

## 2019-07-31 DIAGNOSIS — Z7984 Long term (current) use of oral hypoglycemic drugs: Secondary | ICD-10-CM

## 2019-07-31 DIAGNOSIS — R6510 Systemic inflammatory response syndrome (SIRS) of non-infectious origin without acute organ dysfunction: Secondary | ICD-10-CM

## 2019-07-31 DIAGNOSIS — Z79899 Other long term (current) drug therapy: Secondary | ICD-10-CM | POA: Diagnosis not present

## 2019-07-31 DIAGNOSIS — R0789 Other chest pain: Secondary | ICD-10-CM

## 2019-07-31 DIAGNOSIS — R0902 Hypoxemia: Secondary | ICD-10-CM | POA: Diagnosis present

## 2019-07-31 DIAGNOSIS — E662 Morbid (severe) obesity with alveolar hypoventilation: Secondary | ICD-10-CM | POA: Diagnosis present

## 2019-07-31 DIAGNOSIS — K5900 Constipation, unspecified: Secondary | ICD-10-CM | POA: Diagnosis not present

## 2019-07-31 DIAGNOSIS — J1282 Pneumonia due to coronavirus disease 2019: Secondary | ICD-10-CM | POA: Diagnosis present

## 2019-07-31 DIAGNOSIS — Z6841 Body Mass Index (BMI) 40.0 and over, adult: Secondary | ICD-10-CM

## 2019-07-31 DIAGNOSIS — R0602 Shortness of breath: Secondary | ICD-10-CM | POA: Diagnosis not present

## 2019-07-31 DIAGNOSIS — J440 Chronic obstructive pulmonary disease with acute lower respiratory infection: Secondary | ICD-10-CM | POA: Diagnosis present

## 2019-07-31 DIAGNOSIS — E119 Type 2 diabetes mellitus without complications: Secondary | ICD-10-CM | POA: Diagnosis not present

## 2019-07-31 DIAGNOSIS — T380X5A Adverse effect of glucocorticoids and synthetic analogues, initial encounter: Secondary | ICD-10-CM | POA: Diagnosis not present

## 2019-07-31 DIAGNOSIS — Z91038 Other insect allergy status: Secondary | ICD-10-CM

## 2019-07-31 DIAGNOSIS — R000 Tachycardia, unspecified: Secondary | ICD-10-CM | POA: Diagnosis present

## 2019-07-31 DIAGNOSIS — I10 Essential (primary) hypertension: Secondary | ICD-10-CM | POA: Diagnosis present

## 2019-07-31 DIAGNOSIS — E8881 Metabolic syndrome: Secondary | ICD-10-CM | POA: Diagnosis present

## 2019-07-31 DIAGNOSIS — Z880 Allergy status to penicillin: Secondary | ICD-10-CM

## 2019-07-31 DIAGNOSIS — U071 COVID-19: Principal | ICD-10-CM | POA: Diagnosis present

## 2019-07-31 DIAGNOSIS — F319 Bipolar disorder, unspecified: Secondary | ICD-10-CM | POA: Diagnosis present

## 2019-07-31 DIAGNOSIS — J9600 Acute respiratory failure, unspecified whether with hypoxia or hypercapnia: Secondary | ICD-10-CM

## 2019-07-31 DIAGNOSIS — F3181 Bipolar II disorder: Secondary | ICD-10-CM

## 2019-07-31 DIAGNOSIS — E11649 Type 2 diabetes mellitus with hypoglycemia without coma: Secondary | ICD-10-CM | POA: Diagnosis not present

## 2019-07-31 DIAGNOSIS — Z885 Allergy status to narcotic agent status: Secondary | ICD-10-CM

## 2019-07-31 LAB — BRAIN NATRIURETIC PEPTIDE: B Natriuretic Peptide: 22.2 pg/mL (ref 0.0–100.0)

## 2019-07-31 LAB — COMPREHENSIVE METABOLIC PANEL
ALT: 35 U/L (ref 0–44)
AST: 47 U/L — ABNORMAL HIGH (ref 15–41)
Albumin: 3.3 g/dL — ABNORMAL LOW (ref 3.5–5.0)
Alkaline Phosphatase: 92 U/L (ref 38–126)
Anion gap: 12 (ref 5–15)
BUN: 9 mg/dL (ref 6–20)
CO2: 24 mmol/L (ref 22–32)
Calcium: 8.5 mg/dL — ABNORMAL LOW (ref 8.9–10.3)
Chloride: 103 mmol/L (ref 98–111)
Creatinine, Ser: 0.89 mg/dL (ref 0.44–1.00)
GFR calc Af Amer: >60 mL/min (ref >60)
GFR calc non Af Amer: >60 mL/min (ref >60)
Glucose, Bld: 122 mg/dL — ABNORMAL HIGH (ref 70–99)
Potassium: 3.6 mmol/L (ref 3.5–5.1)
Sodium: 139 mmol/L (ref 135–145)
Total Bilirubin: 0.9 mg/dL (ref 0.3–1.2)
Total Protein: 7.3 g/dL (ref 6.5–8.1)

## 2019-07-31 LAB — CBC WITH DIFFERENTIAL/PLATELET
Abs Immature Granulocytes: 0.01 10*3/uL (ref 0.00–0.07)
Basophils Absolute: 0 10*3/uL (ref 0.0–0.1)
Basophils Relative: 0 %
Eosinophils Absolute: 0 10*3/uL (ref 0.0–0.5)
Eosinophils Relative: 0 %
HCT: 42.9 % (ref 36.0–46.0)
Hemoglobin: 13.9 g/dL (ref 12.0–15.0)
Immature Granulocytes: 0 %
Lymphocytes Relative: 16 %
Lymphs Abs: 0.8 10*3/uL (ref 0.7–4.0)
MCH: 30.3 pg (ref 26.0–34.0)
MCHC: 32.4 g/dL (ref 30.0–36.0)
MCV: 93.5 fL (ref 80.0–100.0)
Monocytes Absolute: 0.3 10*3/uL (ref 0.1–1.0)
Monocytes Relative: 6 %
Neutro Abs: 4 10*3/uL (ref 1.7–7.7)
Neutrophils Relative %: 78 %
Platelets: 187 10*3/uL (ref 150–400)
RBC: 4.59 MIL/uL (ref 3.87–5.11)
RDW: 13.7 % (ref 11.5–15.5)
WBC: 5.1 10*3/uL (ref 4.0–10.5)
nRBC: 0 % (ref 0.0–0.2)

## 2019-07-31 LAB — D-DIMER, QUANTITATIVE: D-Dimer, Quant: 0.8 ug/mL-FEU — ABNORMAL HIGH (ref 0.00–0.50)

## 2019-07-31 LAB — PROCALCITONIN: Procalcitonin: <0.1 ng/mL

## 2019-07-31 LAB — LACTATE DEHYDROGENASE: LDH: 305 U/L — ABNORMAL HIGH (ref 98–192)

## 2019-07-31 LAB — TROPONIN I (HIGH SENSITIVITY): Troponin I (High Sensitivity): 7 ng/L (ref <18)

## 2019-07-31 LAB — FERRITIN: Ferritin: 318 ng/mL — ABNORMAL HIGH (ref 11–307)

## 2019-07-31 LAB — FIBRINOGEN: Fibrinogen: 519 mg/dL — ABNORMAL HIGH (ref 210–475)

## 2019-07-31 LAB — C-REACTIVE PROTEIN: CRP: 10.2 mg/dL — ABNORMAL HIGH (ref <1.0)

## 2019-07-31 LAB — LACTIC ACID, PLASMA: Lactic Acid, Venous: 1.2 mmol/L (ref 0.5–1.9)

## 2019-07-31 LAB — TRIGLYCERIDES: Triglycerides: 141 mg/dL (ref <150)

## 2019-07-31 LAB — ABO/RH: ABO/RH(D): O POS

## 2019-07-31 MED ORDER — SODIUM CHLORIDE 0.9 % IV SOLN
100.0000 mg | INTRAVENOUS | Status: AC
  Administered 2019-07-31 (×2): 100 mg via INTRAVENOUS
  Filled 2019-07-31: qty 20

## 2019-07-31 MED ORDER — GUAIFENESIN-DM 100-10 MG/5ML PO SYRP
10.0000 mL | ORAL_SOLUTION | ORAL | Status: DC | PRN
  Administered 2019-08-06 – 2019-08-07 (×2): 10 mL via ORAL
  Filled 2019-07-31 (×3): qty 10

## 2019-07-31 MED ORDER — SODIUM CHLORIDE 0.9 % IV SOLN
100.0000 mg | Freq: Every day | INTRAVENOUS | Status: AC
  Administered 2019-07-31 – 2019-08-03 (×4): 100 mg via INTRAVENOUS
  Filled 2019-07-31 (×4): qty 20

## 2019-07-31 MED ORDER — SODIUM CHLORIDE 0.9 % IV SOLN: 200.0000 mg | Freq: Once | INTRAVENOUS | Status: DC

## 2019-07-31 MED ORDER — ALBUTEROL SULFATE HFA 108 (90 BASE) MCG/ACT IN AERS: 2.0000 | INHALATION_SPRAY | Freq: Four times a day (QID) | RESPIRATORY_TRACT | Status: DC | PRN

## 2019-07-31 MED ORDER — ACETAMINOPHEN 325 MG PO TABS
650.0000 mg | ORAL_TABLET | Freq: Once | ORAL | Status: AC
  Administered 2019-07-31: 650 mg via ORAL
  Filled 2019-07-31: qty 2

## 2019-07-31 MED ORDER — ACETAMINOPHEN 325 MG PO TABS
650.0000 mg | ORAL_TABLET | Freq: Four times a day (QID) | ORAL | Status: DC | PRN
  Administered 2019-08-01 – 2019-08-07 (×3): 650 mg via ORAL
  Filled 2019-07-31 (×3): qty 2

## 2019-07-31 MED ORDER — ZINC SULFATE 220 (50 ZN) MG PO CAPS
220.0000 mg | ORAL_CAPSULE | Freq: Every day | ORAL | Status: DC
  Administered 2019-08-01 – 2019-08-13 (×14): 220 mg via ORAL
  Filled 2019-07-31 (×14): qty 1

## 2019-07-31 MED ORDER — DEXAMETHASONE SODIUM PHOSPHATE 10 MG/ML IJ SOLN
6.0000 mg | Freq: Every day | INTRAMUSCULAR | Status: DC
  Administered 2019-07-31: 6 mg via INTRAVENOUS
  Filled 2019-07-31: qty 1

## 2019-07-31 MED ORDER — SODIUM CHLORIDE 0.9 % IV SOLN: 100.0000 mg | Freq: Every day | INTRAVENOUS | Status: DC

## 2019-07-31 MED ORDER — ASCORBIC ACID 500 MG PO TABS
500.0000 mg | ORAL_TABLET | Freq: Every day | ORAL | Status: DC
  Administered 2019-08-01 – 2019-08-13 (×14): 500 mg via ORAL
  Filled 2019-07-31 (×14): qty 1

## 2019-07-31 MED ORDER — ENOXAPARIN SODIUM 80 MG/0.8ML ~~LOC~~ SOLN
70.0000 mg | SUBCUTANEOUS | Status: DC
  Administered 2019-07-31 – 2019-08-12 (×13): 70 mg via SUBCUTANEOUS
  Filled 2019-07-31 (×9): qty 0.8
  Filled 2019-07-31: qty 0.7
  Filled 2019-07-31 (×3): qty 0.8

## 2019-07-31 NOTE — ED Provider Notes (Signed)
[facility] DEPT Provider Note   CSN: [id number] Arrival date & time: 07/31/19  1718     History Chief Complaint  Patient presents with  . Shortness of Breath    Christie Walker is a 49 y.o. female.  Patient diagnosed with Covid infection on March 16.  Became symptomatic on March 15.  Started to feel worse shortness of breath a couple days ago.  Patient has a family member that has COVID-19 the just got out of the hospital.  Patient complaints include shortness of breath body aches mostly in the mid back area.  Cough.  And some nasal congestion.  Associated with some diarrhea had about 6 bowel movements today.  No blood in it.  No nausea or vomiting.  Patient's room air sats on arrival here 86%.  Patient started on 4 L of oxygen.  Oxygen sats got into the high 90s.  Taper down today to 2 L patient satting at 92%.        Past Medical History:  Diagnosis Date  . Acid reflux   . Bipolar 1 disorder ([place])   . Essential hypertension 06/14/2016    Patient Active Problem List   Diagnosis Date Noted  . Chest pain at rest 08/05/2016  . Morbid obesity with BMI of 50.0-59.9, adult ([place]) 08/05/2016  . Dyspnea on exertion 08/05/2016  . Essential hypertension 06/14/2016  . Metabolic syndrome 36/64/4034  . Chronic knee pain 06/14/2016  . OBESITY 03/08/2007  . TMJ SYNDROME 03/08/2007  . GERD 03/08/2007    No past surgical history on file.   OB History   No obstetric history on file.     Family History  Problem Relation Age of Onset  . Mental illness Other     Social History   Tobacco Use  . Smoking status: Never Smoker  . Smokeless tobacco: Never Used  Substance Use Topics  . Alcohol use: No  . Drug use: No    Home Medications Prior to Admission medications   Medication Sig Start Date End Date Taking? Authorizing Provider  albuterol (PROVENTIL HFA;VENTOLIN HFA) 108 (90 Base) MCG/ACT inhaler Inhale 2 puffs into the lungs every 6  (six) hours as needed for wheezing or shortness of breath.    [provider]  fluticasone (FLONASE) 50 MCG/ACT nasal spray Place 1 spray into both nostrils daily.    [provider]  furosemide (LASIX) 20 MG tablet TAKE 1 TABLET(20 MG) BY MOUTH DAILY Patient taking differently: Take 20 mg by mouth daily.  06/05/19   Tresa Garter, MD  gabapentin (NEURONTIN) 100 MG capsule Take 100 mg by mouth 2 (two) times daily.     [provider]  IRON PO Take 1 tablet by mouth daily.    [provider]  loratadine-pseudoephedrine (CLARITIN-D 24-HOUR) 10-240 MG 24 hr tablet Take 1 tablet by mouth daily as needed for allergies.     [provider]  lurasidone (LATUDA) 40 MG TABS tablet Take 40 mg by mouth daily.    [provider]  metFORMIN (GLUCOPHAGE) 500 MG tablet Take 1 tablet (500 mg total) by mouth daily with breakfast. 01/11/19   Lanae Boast, FNP  naproxen (NAPROSYN) 500 MG tablet TAKE 1 TABLET(500 MG) BY MOUTH TWICE DAILY WITH A MEAL Patient not taking: Reported on 06/14/2019 09/20/18   Lanae Boast, FNP  nystatin-triamcinolone ointment [facility]) Apply 1 application topically 2 (two) times daily. Patient not taking: Reported on 06/15/2018 12/08/17   Lanae Boast, FNP  Vitamin  D, Ergocalciferol, (DRISDOL) 1.25 MG (50000 UNIT) CAPS capsule TAKE 1 CAPSULE BY MOUTH EVERY 7 DAYS 07/29/19   Quentin Angst, MD    Allergies    Bee venom, Amoxicillin, Hydrocodone, Insect extract allergy skin test, and Propoxyphene n-acetaminophen  Review of Systems   Review of Systems  Constitutional: Positive for fever. Negative for chills.  HENT: Positive for congestion. Negative for rhinorrhea and sore throat.   Eyes: Negative for visual disturbance.  Respiratory: Positive for shortness of breath. Negative for cough.   Cardiovascular: Negative for chest pain and leg swelling.  Gastrointestinal: Positive for diarrhea. Negative for abdominal pain, nausea and  vomiting.  Genitourinary: Negative for dysuria.  Musculoskeletal: Positive for myalgias. Negative for back pain and neck pain.  Skin: Negative for rash.  Neurological: Negative for dizziness, light-headedness and headaches.  Hematological: Does not bruise/bleed easily.  Psychiatric/Behavioral: Negative for confusion.    Physical Exam Updated Vital Signs BP 129/76 (BP Location: Left Arm)   Pulse (!) 107   Temp (!) 100.8 F (38.2 C) (Oral)   Resp (!) 24   Ht 1.626 m (5\' 4" )   Wt (!) 142.9 kg   LMP 07/31/2019   SpO2 (!) 84%   BMI 54.07 kg/m   Physical Exam Vitals and nursing note reviewed.  Constitutional:      General: She is not in acute distress.    Appearance: Normal appearance. She is well-developed.  HENT:     Head: Normocephalic and atraumatic.  Eyes:     Extraocular Movements: Extraocular movements intact.     Conjunctiva/sclera: Conjunctivae normal.     Pupils: Pupils are equal, round, and reactive to light.  Cardiovascular:     Rate and Rhythm: Regular rhythm. Tachycardia present.     Heart sounds: No murmur.  Pulmonary:     Effort: Respiratory distress present.     Breath sounds: Normal breath sounds. No wheezing or rales.  Abdominal:     Palpations: Abdomen is soft.     Tenderness: There is no abdominal tenderness.  Musculoskeletal:        General: No swelling. Normal range of motion.     Cervical back: Normal range of motion and neck supple.  Skin:    General: Skin is warm and dry.     Capillary Refill: Capillary refill takes less than 2 seconds.  Neurological:     General: No focal deficit present.     Mental Status: She is alert and oriented to person, place, and time.     Cranial Nerves: No cranial nerve deficit.     Sensory: No sensory deficit.     Motor: No weakness.     ED Results / Procedures / Treatments   Labs (all labs ordered are listed, but only abnormal results are displayed) Labs Reviewed  CULTURE, BLOOD (ROUTINE X 2)  CULTURE,  BLOOD (ROUTINE X 2)  LACTIC ACID, PLASMA  LACTIC ACID, PLASMA  CBC WITH DIFFERENTIAL/PLATELET  COMPREHENSIVE METABOLIC PANEL  D-DIMER, QUANTITATIVE (NOT AT [facility])  PROCALCITONIN  LACTATE DEHYDROGENASE  FERRITIN  TRIGLYCERIDES  FIBRINOGEN  C-REACTIVE PROTEIN  I-STAT BETA HCG BLOOD, ED ([facility], [facility], [facility] ONLY)    EKG None   ED ECG REPORT   Date: 07/31/2019  Rate: 107  Rhythm: sinus tachycardia  QRS Axis: left  Intervals: normal  ST/T Wave abnormalities: nonspecific T wave changes  Conduction Disutrbances:left anterior fascicular block  Narrative Interpretation:   Old EKG Reviewed: none available  I have personally reviewed the EKG tracing and  agree with the computerized printout as noted. Was able to find an EKG from 2018.  No significant change other than the sinus tachycardia.  Radiology DG Chest Port 1 View  Result Date: 07/31/2019 CLINICAL DATA:  49 y.o female covid positive 3/16. Pt c/o SOB and cough. Hx HTN. Pt unable to take a deep inspiration for imaging. EXAM: PORTABLE CHEST 1 VIEW COMPARISON:  11/03/2016 FINDINGS: Left greater than right hazy airspace lung opacities in the mid to lower lungs predominantly, new since the prior exam, consistent with multifocal pneumonia. No convincing pleural effusion and no pneumothorax. Cardiac silhouette is normal in size. No mediastinal or hilar masses. Skeletal structures are grossly intact. IMPRESSION: 1. Hazy, left greater than right, bilateral airspace lung opacities consistent with multifocal pneumonia and in a pattern consistent COVID-19 pneumonia. Electronically Signed   By: Amie Portland M.D.   On: 07/31/2019 17:52    Procedures Procedures (including critical care time)  Medications Ordered in ED Medications  acetaminophen (TYLENOL) tablet 650 mg (has no administration in time range)    ED Course  I have reviewed the triage vital signs and the nursing notes.  Pertinent labs & imaging results that were available during my  care of the patient were reviewed by me and considered in my medical decision making (see chart for details).    MDM Rules/Calculators/A&P                      CRITICAL CARE Performed by: Vanetta Mulders Total critical care time: 30 minutes Critical care time was exclusive of separately billable procedures and treating other patients. Critical care was necessary to treat or prevent imminent or life-threatening deterioration. Critical care was time spent personally by me on the following activities: development of treatment plan with patient and/or surrogate as well as nursing, discussions with consultants, evaluation of patient's response to treatment, examination of patient, obtaining history from patient or surrogate, ordering and performing treatments and interventions, ordering and review of laboratory studies, ordering and review of radiographic studies, pulse oximetry and re-evaluation of patient's condition. '     Final Clinical Impression(s) / ED Diagnoses Final diagnoses:  Pneumonia due to COVID-19 virus  Hypoxia    Patient's oxygen saturations here are holding at 92% on 2 L.  Chest x-ray consistent with multifocal pneumonia consistent with COVID-19 infection.  Covid labs have been ordered.  Patient will require admission for hypoxia.  Labs without significant abnormalities.  Do not think that pulmonary embolus is clinically of concern.  D-dimer slightly elevated but not significantly elevated in the in light of the COVID-19 infection.  We will discuss with hospitalist for admission. Rx / DC Orders ED Discharge Orders    None       Vanetta Mulders, MD 07/31/19 2059

## 2019-07-31 NOTE — ED Notes (Signed)
O2 increased to 3L [place].

## 2019-07-31 NOTE — ED Triage Notes (Signed)
49 yo female diagnosed Covid+ on 07/23/19. C/O increased SHOB for the past 3 days. Also C/O midsternal chest tightness for that same amount of time. Sat 84%RA while being triaged and up to 96% on 4LNC. A/Ox3. Skin w/d/pink. Able to speak in short sentences.  Hx of pedal swelling, bipolar

## 2019-07-31 NOTE — H&P (Signed)
History and Physical    Christie Walker WGN:[id number] DOB: [birth year] DOA: 07/31/2019  PCP: Barbette Merino, NP Patient coming from: Home  Chief Complaint: SOB, COVID+  HPI: Christie Walker is a 49 y.o. female with medical history significant of hypertension, bipolar disorder, GERD presenting to the ED with complaints of shortness of breath and chest tightness.  Patient tested positive for COVID-19 on 07/23/2019.  Patient states her symptoms started on 3/15.  She is having fevers, chills, cough, and shortness of breath.  Shortness of breath is worse with exertion and she experiences substernal chest tightness.  Also having diarrhea.  No abdominal pain or vomiting.  ED Course: Temperature 100.8 F.  Slightly tachycardic.  Tachypneic.  Oxygen saturation 84% on room air, improved with 2 L supplemental oxygen.  Labs showing no leukocytosis.  Lactic acid normal.  Blood culture x2 pending.  Procalcitonin <0.10. D-dimer 0.8, LDH 305, ferritin 318, fibrinogen 519, and CRP 10.2.  EKG without acute ischemic changes.  Chest x-ray showing hazy, left greater than right bilateral airspace opacities consistent with COVID-19 multifocal pneumonia.   Patient received Tylenol.  Review of Systems:  All systems reviewed and apart from history of presenting illness, are negative.  Past Medical History:  Diagnosis Date  . Acid reflux   . Bipolar 1 disorder (HCC)   . Essential hypertension 06/14/2016    History reviewed. No pertinent surgical history.   reports that she has never smoked. She has never used smokeless tobacco. She reports that she does not drink alcohol or use drugs.  Allergies  Allergen Reactions  . Bee Venom Swelling    Swelling at site   . Amoxicillin Hives and Other (See Comments)    Patient states that she can take this she just has a sensitivity when its taken along with pain medication  . Hydrocodone Hives and Other (See Comments)    Sensitivity when taken along with  antibiotics per patient  . Insect Extract Allergy Skin Test Other (See Comments)    Spiders - make tongue burn  . Propoxyphene N-Acetaminophen Nausea And Vomiting and Rash    Family History  Problem Relation Age of Onset  . Mental illness Other     Prior to Admission medications   Medication Sig Start Date End Date Taking? Authorizing Provider  albuterol (PROVENTIL HFA;VENTOLIN HFA) 108 (90 Base) MCG/ACT inhaler Inhale 2 puffs into the lungs every 6 (six) hours as needed for wheezing or shortness of breath.    [provider]  fluticasone (FLONASE) 50 MCG/ACT nasal spray Place 1 spray into both nostrils daily.    [provider]  furosemide (LASIX) 20 MG tablet TAKE 1 TABLET(20 MG) BY MOUTH DAILY Patient taking differently: Take 20 mg by mouth daily.  06/05/19   Quentin Angst, MD  gabapentin (NEURONTIN) 100 MG capsule Take 100 mg by mouth 2 (two) times daily.     [provider]  IRON PO Take 1 tablet by mouth daily.    [provider]  loratadine-pseudoephedrine (CLARITIN-D 24-HOUR) 10-240 MG 24 hr tablet Take 1 tablet by mouth daily as needed for allergies.     [provider]  lurasidone (LATUDA) 40 MG TABS tablet Take 40 mg by mouth daily.    [provider]  metFORMIN (GLUCOPHAGE) 500 MG tablet Take 1 tablet (500 mg total) by mouth daily with breakfast. 01/11/19   Mike Gip, FNP  naproxen (NAPROSYN) 500 MG tablet TAKE 1 TABLET(500 MG) BY MOUTH TWICE DAILY WITH  A MEAL Patient not taking: Reported on 06/14/2019 09/20/18   Mike Gip, FNP  nystatin-triamcinolone ointment [facility]) Apply 1 application topically 2 (two) times daily. Patient not taking: Reported on 06/15/2018 12/08/17   Mike Gip, FNP  Vitamin D, Ergocalciferol, (DRISDOL) 1.25 MG (50000 UNIT) CAPS capsule TAKE 1 CAPSULE BY MOUTH EVERY 7 DAYS 07/29/19   Quentin Angst, MD    Physical Exam: Vitals:   07/31/19 1845 07/31/19 1900 07/31/19 1935 07/31/19  2030  BP: (!) 144/84 123/78 124/75 124/76  Pulse: (!) 103 (!) 103 97 94  Resp: (!) 45 (!) 44  (!) 41  Temp:      TempSrc:      SpO2: 94% 92% 92% 92%  Weight:      Height:        Physical Exam  Constitutional: She is oriented to person, place, and time. She appears well-developed and well-nourished.  Appears ill  HENT:  Head: Normocephalic.  Eyes: Right eye exhibits no discharge. Left eye exhibits no discharge.  Cardiovascular: Normal rate, regular rhythm and intact distal pulses.  Pulmonary/Chest: She is in respiratory distress.  Coarse breath sounds Tachypneic  Abdominal: Soft. Bowel sounds are normal. She exhibits no distension. There is no abdominal tenderness. There is no guarding.  Musculoskeletal:     Cervical back: Neck supple.     Comments: +1 pedal edema  Neurological: She is alert and oriented to person, place, and time.  Skin: Skin is warm and dry. She is not diaphoretic.     Labs on Admission: I have personally reviewed following labs and imaging studies  CBC: Recent Labs  Lab 07/31/19 1801  WBC 5.1  NEUTROABS 4.0  HGB 13.9  HCT 42.9  MCV 93.5  PLT 187   Basic Metabolic Panel: Recent Labs  Lab 07/31/19 1801  NA 139  K 3.6  CL 103  CO2 24  GLUCOSE 122*  BUN 9  CREATININE 0.89  CALCIUM 8.5*   GFR: Estimated Creatinine Clearance: 111 mL/min (by C-G formula based on SCr of 0.89 mg/dL). Liver Function Tests: Recent Labs  Lab 07/31/19 1801  AST 47*  ALT 35  ALKPHOS 92  BILITOT 0.9  PROT 7.3  ALBUMIN 3.3*   No results for input(s): LIPASE, AMYLASE in the last 168 hours. No results for input(s): AMMONIA in the last 168 hours. Coagulation Profile: No results for input(s): INR, PROTIME in the last 168 hours. Cardiac Enzymes: No results for input(s): CKTOTAL, CKMB, CKMBINDEX, TROPONINI in the last 168 hours. BNP (last 3 results) No results for input(s): PROBNP in the last 8760 hours. HbA1C: No results for input(s): HGBA1C in the last 72  hours. CBG: No results for input(s): GLUCAP in the last 168 hours. Lipid Profile: Recent Labs    07/31/19 1801  TRIG 141   Thyroid Function Tests: No results for input(s): TSH, T4TOTAL, FREET4, T3FREE, THYROIDAB in the last 72 hours. Anemia Panel: Recent Labs    07/31/19 1801  FERRITIN 318*   Urine analysis:    Component Value Date/Time   COLORURINE YELLOW 08/26/2011 0251   APPEARANCEUR CLOUDY (A) 08/26/2011 0251   LABSPEC 1.025 01/26/2017 1508   PHURINE 6.0 01/26/2017 1508   GLUCOSEU NEGATIVE 01/26/2017 1508   HGBUR NEGATIVE 01/26/2017 1508   BILIRUBINUR neg 06/14/2019 1512   KETONESUR NEGATIVE 01/26/2017 1508   PROTEINUR Negative 06/14/2019 1512   PROTEINUR NEGATIVE 01/26/2017 1508   UROBILINOGEN 0.2 06/14/2019 1512   UROBILINOGEN 0.2 01/26/2017 1508   NITRITE neg 06/14/2019 1512  NITRITE NEGATIVE 01/26/2017 1508   LEUKOCYTESUR Negative 06/14/2019 1512    Radiological Exams on Admission: DG Chest Port 1 View  Result Date: 07/31/2019 CLINICAL DATA:  49 y.o female covid positive 3/16. Pt c/o SOB and cough. Hx HTN. Pt unable to take a deep inspiration for imaging. EXAM: PORTABLE CHEST 1 VIEW COMPARISON:  11/03/2016 FINDINGS: Left greater than right hazy airspace lung opacities in the mid to lower lungs predominantly, new since the prior exam, consistent with multifocal pneumonia. No convincing pleural effusion and no pneumothorax. Cardiac silhouette is normal in size. No mediastinal or hilar masses. Skeletal structures are grossly intact. IMPRESSION: 1. Hazy, left greater than right, bilateral airspace lung opacities consistent with multifocal pneumonia and in a pattern consistent COVID-19 pneumonia. Electronically Signed   By: Lajean Manes M.D.   On: 07/31/2019 17:52    EKG: Independently reviewed.  Sinus tachycardia, LAFB, QTC 498.  No acute ischemic changes.  Assessment/Plan Principal Problem:   Pneumonia due to COVID-19 virus Active Problems:   HTN  (hypertension)   Acute respiratory failure with hypoxia (HCC)   SIRS (systemic inflammatory response syndrome) (HCC)   Chest tightness   Acute hypoxic respiratory failure and SIRS secondary to COVID-19 multifocal pneumonia: Tested positive for COVID-19 on 07/23/2019.  Febrile, tachycardic, and tachypneic.  Oxygen saturation 84% on room air, improved with 2 L supplemental oxygen.  Labs showing no leukocytosis.  Lactic acid normal. Chest x-ray showing hazy, left greater than right bilateral airspace opacities consistent with COVID-19 multifocal pneumonia.  Bacterial pneumonia less likely given procalcitonin <0.10.  Inflammatory markers elevated: D-dimer 0.8, LDH 305, ferritin 318, fibrinogen 519, and CRP 10.2.  -Remdesivir dosing per pharmacy -IV Decadron 6 mg daily -Vitamin C, zinc -Antitussive as needed -Tylenol as needed -Inhaler as needed -Daily CBC with differential, CMP, CRP, D-dimer, LDH -Airborne and contact precautions -Continuous pulse ox -Supplemental oxygen as needed to keep oxygen saturation above 90% -Blood culture x2 pending  Chest tightness: Likely related to dyspnea.  EKG without acute ischemic changes.  Check high-sensitivity troponin level and continue cardiac monitoring.  Hypertension: Stable.  Currently normotensive.  Pharmacy med rec pending.  DVT prophylaxis: Lovenox Code Status: Full code Family Communication: No family available at this time. Disposition Plan: Anticipate discharge after clinical improvement.  Currently needs treatment for hypoxia secondary to COVID-19 multifocal pneumonia. Consults called: None Admission status: It is my clinical opinion that admission to INPATIENT is reasonable and necessary because of the expectation that this patient will require hospital care that crosses at least 2 midnights to treat this condition based on the medical complexity of the problems presented.  Given the aforementioned information, the predictability of an adverse  outcome is felt to be significant.  The medical decision making on this patient was of high complexity and the patient is at high risk for clinical deterioration, therefore this is a level 3 visit.  Shela Leff MD [facility]  If 7PM-7AM, please contact night-coverage [email or web address]  07/31/2019, 9:45 PM

## 2019-08-01 ENCOUNTER — Other Ambulatory Visit: Payer: Self-pay

## 2019-08-01 ENCOUNTER — Encounter (HOSPITAL_COMMUNITY): Payer: Self-pay | Admitting: Internal Medicine

## 2019-08-01 LAB — C-REACTIVE PROTEIN: CRP: 11.8 mg/dL — ABNORMAL HIGH

## 2019-08-01 LAB — COMPREHENSIVE METABOLIC PANEL
ALT: 40 U/L (ref 0–44)
AST: 62 U/L — ABNORMAL HIGH (ref 15–41)
Albumin: 3.1 g/dL — ABNORMAL LOW (ref 3.5–5.0)
Alkaline Phosphatase: 99 U/L (ref 38–126)
Anion gap: 11 (ref 5–15)
BUN: 9 mg/dL (ref 6–20)
CO2: 23 mmol/L (ref 22–32)
Calcium: 8.4 mg/dL — ABNORMAL LOW (ref 8.9–10.3)
Chloride: 105 mmol/L (ref 98–111)
Creatinine, Ser: 0.66 mg/dL (ref 0.44–1.00)
GFR calc Af Amer: >60 mL/min (ref >60)
GFR calc non Af Amer: >60 mL/min (ref >60)
Glucose, Bld: 151 mg/dL — ABNORMAL HIGH (ref 70–99)
Potassium: 3.5 mmol/L (ref 3.5–5.1)
Sodium: 139 mmol/L (ref 135–145)
Total Bilirubin: 0.5 mg/dL (ref 0.3–1.2)
Total Protein: 7.1 g/dL (ref 6.5–8.1)

## 2019-08-01 LAB — CBC WITH DIFFERENTIAL/PLATELET
Abs Immature Granulocytes: 0.02 K/uL (ref 0.00–0.07)
Basophils Absolute: 0 K/uL (ref 0.0–0.1)
Basophils Relative: 0 %
Eosinophils Absolute: 0 K/uL (ref 0.0–0.5)
Eosinophils Relative: 0 %
HCT: 41.2 % (ref 36.0–46.0)
Hemoglobin: 13.1 g/dL (ref 12.0–15.0)
Immature Granulocytes: 0 %
Lymphocytes Relative: 10 %
Lymphs Abs: 0.5 K/uL — ABNORMAL LOW (ref 0.7–4.0)
MCH: 29.4 pg (ref 26.0–34.0)
MCHC: 31.8 g/dL (ref 30.0–36.0)
MCV: 92.6 fL (ref 80.0–100.0)
Monocytes Absolute: 0.1 K/uL (ref 0.1–1.0)
Monocytes Relative: 2 %
Neutro Abs: 4 K/uL (ref 1.7–7.7)
Neutrophils Relative %: 88 %
Platelets: 192 K/uL (ref 150–400)
RBC: 4.45 MIL/uL (ref 3.87–5.11)
RDW: 13.7 % (ref 11.5–15.5)
WBC: 4.6 K/uL (ref 4.0–10.5)
nRBC: 0 % (ref 0.0–0.2)

## 2019-08-01 LAB — HIV ANTIBODY (ROUTINE TESTING W REFLEX): HIV Screen 4th Generation wRfx: NONREACTIVE

## 2019-08-01 LAB — LACTATE DEHYDROGENASE: LDH: 324 U/L — ABNORMAL HIGH (ref 98–192)

## 2019-08-01 LAB — GLUCOSE, CAPILLARY: Glucose-Capillary: 132 mg/dL — ABNORMAL HIGH (ref 70–99)

## 2019-08-01 LAB — D-DIMER, QUANTITATIVE: D-Dimer, Quant: 0.88 ug/mL-FEU — ABNORMAL HIGH (ref 0.00–0.50)

## 2019-08-01 LAB — HEMOGLOBIN A1C
Hgb A1c MFr Bld: 6.1 % — ABNORMAL HIGH (ref 4.8–5.6)
Mean Plasma Glucose: 128.37 mg/dL

## 2019-08-01 MED ORDER — BENZONATATE 100 MG PO CAPS
100.0000 mg | ORAL_CAPSULE | Freq: Two times a day (BID) | ORAL | Status: DC | PRN
  Administered 2019-08-06 – 2019-08-07 (×2): 100 mg via ORAL
  Filled 2019-08-01 (×2): qty 1

## 2019-08-01 MED ORDER — TOCILIZUMAB 400 MG/20ML IV SOLN
800.0000 mg | Freq: Once | INTRAVENOUS | Status: AC
  Administered 2019-08-01: 800 mg via INTRAVENOUS
  Filled 2019-08-01: qty 40

## 2019-08-01 MED ORDER — LURASIDONE HCL 40 MG PO TABS
40.0000 mg | ORAL_TABLET | Freq: Every day | ORAL | Status: DC
  Administered 2019-08-01 – 2019-08-13 (×13): 40 mg via ORAL
  Filled 2019-08-01 (×13): qty 1

## 2019-08-01 MED ORDER — FUROSEMIDE 20 MG PO TABS
20.0000 mg | ORAL_TABLET | Freq: Every day | ORAL | Status: DC
  Administered 2019-08-01 – 2019-08-06 (×6): 20 mg via ORAL
  Filled 2019-08-01 (×6): qty 1

## 2019-08-01 MED ORDER — METHYLPREDNISOLONE SODIUM SUCC 40 MG IJ SOLR
40.0000 mg | Freq: Two times a day (BID) | INTRAMUSCULAR | Status: DC
  Administered 2019-08-01 – 2019-08-03 (×5): 40 mg via INTRAVENOUS
  Filled 2019-08-01 (×5): qty 1

## 2019-08-01 MED ORDER — GABAPENTIN 100 MG PO CAPS
100.0000 mg | ORAL_CAPSULE | Freq: Two times a day (BID) | ORAL | Status: DC
  Administered 2019-08-01 – 2019-08-13 (×25): 100 mg via ORAL
  Filled 2019-08-01 (×25): qty 1

## 2019-08-01 NOTE — Progress Notes (Signed)
After patient transferred from ED stretcher to bed patient very SOB and oxygen saturation 83% on 3L O2. Patient increased slowly however patients sat's not coming above 85%. RT called and patient put on HFNC, continued to increase until at 15L. Patients oxygen saturation maintained 86% even of 15L HFNC. RT placed patient on non-rebreather to allow patient to recover. Patient then taken off non-rebreather and weaned back down on HFNC as tolerated. Order received to maintain oxygen saturation above 85%. Will continue to monitor patient.

## 2019-08-01 NOTE — Plan of Care (Signed)
  Problem: Education: Goal: Knowledge of risk factors and measures for prevention of condition will improve Outcome: Progressing   Problem: Coping: Goal: Psychosocial and spiritual needs will be supported Outcome: Progressing   Problem: Respiratory: Goal: Will maintain a patent airway Outcome: Progressing   

## 2019-08-01 NOTE — Progress Notes (Signed)
PROGRESS NOTE    Christie Walker  ELF:[id number] DOB: 04-04-1971 DOA: 07/31/2019 PCP: Barbette Merino, NP      Brief Narrative:  [name] is a 49 y.o. F with BMI 50, Bipolar in remission, DM and HTN who presented with 1 week fever, chills, cough, chest tightness/dyspnea and SOB with exertion.    In the ER, temp 100.56F, tachypneic, O2 sat 84% on room air.  CXR with bilateral opacities.  COVID+ on 3/16.       Assessment & Plan:  Coronavirus pneumonitis with acute hypoxic respiratory failure Presented with tachypnea, pneumonia and hypoxia and COVID positive test in the setting of the ongoing 2020-2021 COVID-19 pandemic.  She is S/p Bamlanivimab.  O2 requirements up to 12Lpm overnight.  CRP >11 and trending up. -Continue remdesivir -Continue steroids, increase to Solumedrol 40 BID -Give Actemra, have discussed risks and benefits of this off-label medication with the patient, who meets no exclusion criteria -VTE PPx with Lovenox -Continue Zinc and Vitamin C -Flutter valve, turn, cough, incentive spirometry q2hrs while awake    Hypertension BP evaded -Continue furosemide  Metabolic syndrome On metformin at home, no A1c in chart.   Glucose controlled here -Check A1c -BID CBG  Bipolar -Continue lurasidone, follow up med rec           Disposition: The patient was admitted with severe COVID-19.  She has had escalating oxygen requirements, and is breathing greater than 30 times per minute. She will need escalation of care.        DVT prophylaxis: Lovenox Code Status: FULL Family Communication:  MDM: The below labs and imaging reports were reviewed and summarized above.  Medication management as above.  This is a severe life-threatening illness.       Procedures:     Antimicrobials:      Culture data:   3/24 blood culture x2 -- NG yet       Subjective: The patient has no confusion, no more fever, no vomiting.  She is very  tired, has some dyspnea, has a severe cough.  Objective: Vitals:   08/01/19 1012 08/01/19 1426 08/01/19 1838 08/01/19 2055  BP: (!) 160/96 130/84 (!) 144/94 (!) 156/80  Pulse: (!) 103 100 (!) 102 (!) 105  Resp: (!) 43 (!) 48 (!) 47   Temp: 98.6 F (37 C) 98.6 F (37 C) 98.6 F (37 C) 98.8 F (37.1 C)  TempSrc: Oral Oral Oral Oral  SpO2: (!) 87% (!) 88% (!) 84%   Weight:      Height:        Intake/Output Summary (Last 24 hours) at 08/01/2019 2112 Last data filed at 08/01/2019 1500 Gross per 24 hour  Intake 560 ml  Output --  Net 560 ml   Filed Weights   07/31/19 1813 08/01/19 0052  Weight: (!) 142.9 kg (!) 137.7 kg    Examination: General appearance: Obese adult female, alert and in no acute distress.  Sitting up in recliner. HEENT: Anicteric, conjunctiva pink, lids and lashes normal. No nasal deformity, discharge, epistaxis.  Lips moist, dentition normal, oropharynx tacky dry, no oral lesions.   Skin: Warm and dry.  No jaundice.  No suspicious rashes or lesions. Cardiac: Tachycardic, nl S1-S2, no murmurs appreciated.  Capillary refill is brisk.  JVPnot visible.  No LE edema.  Radial  pulses 2+ and symmetric. Respiratory: Very tachypneic but no accessory muscle use, grunting, or flaring.  CTAB without rales or wheezes. Abdomen: Abdomen soft.  No TTP  or guarding. No ascites, distension, hepatosplenomegaly.   MSK: No deformities or effusions. Neuro: Awake and alert.  EOMI, moves all extremities. Speech fluent.    Psych: Sensorium intact and responding to questions, attention normal. Affect flat.  Judgment and insight appear normal.          Data Reviewed: I have personally reviewed following labs and imaging studies:  CBC: Recent Labs  Lab 07/31/19 1801 08/01/19 0420  WBC 5.1 4.6  NEUTROABS 4.0 4.0  HGB 13.9 13.1  HCT 42.9 41.2  MCV 93.5 92.6  PLT 187 192   Basic Metabolic Panel: Recent Labs  Lab 07/31/19 1801 08/01/19 0420  NA 139 139  K 3.6 3.5  CL  103 105  CO2 24 23  GLUCOSE 122* 151*  BUN 9 9  CREATININE 0.89 0.66  CALCIUM 8.5* 8.4*   GFR: Estimated Creatinine Clearance: 122.6 mL/min (by C-G formula based on SCr of 0.66 mg/dL). Liver Function Tests: Recent Labs  Lab 07/31/19 1801 08/01/19 0420  AST 47* 62*  ALT 35 40  ALKPHOS 92 99  BILITOT 0.9 0.5  PROT 7.3 7.1  ALBUMIN 3.3* 3.1*   No results for input(s): LIPASE, AMYLASE in the last 168 hours. No results for input(s): AMMONIA in the last 168 hours. Coagulation Profile: No results for input(s): INR, PROTIME in the last 168 hours. Cardiac Enzymes: No results for input(s): CKTOTAL, CKMB, CKMBINDEX, TROPONINI in the last 168 hours. BNP (last 3 results) No results for input(s): PROBNP in the last 8760 hours. HbA1C: Recent Labs    08/01/19 0420  HGBA1C 6.1*   CBG: Recent Labs  Lab 08/01/19 1704  GLUCAP 132*   Lipid Profile: Recent Labs    07/31/19 1801  TRIG 141   Thyroid Function Tests: No results for input(s): TSH, T4TOTAL, FREET4, T3FREE, THYROIDAB in the last 72 hours. Anemia Panel: Recent Labs    07/31/19 1801  FERRITIN 318*   Urine analysis:    Component Value Date/Time   COLORURINE YELLOW 08/26/2011 0251   APPEARANCEUR CLOUDY (A) 08/26/2011 0251   LABSPEC 1.025 01/26/2017 1508   PHURINE 6.0 01/26/2017 1508   GLUCOSEU NEGATIVE 01/26/2017 1508   HGBUR NEGATIVE 01/26/2017 1508   BILIRUBINUR neg 06/14/2019 1512   KETONESUR NEGATIVE 01/26/2017 1508   PROTEINUR Negative 06/14/2019 1512   PROTEINUR NEGATIVE 01/26/2017 1508   UROBILINOGEN 0.2 06/14/2019 1512   UROBILINOGEN 0.2 01/26/2017 1508   NITRITE neg 06/14/2019 1512   NITRITE NEGATIVE 01/26/2017 1508   LEUKOCYTESUR Negative 06/14/2019 1512   Sepsis Labs: @LABRCNTIP (procalcitonin:4,lacticacidven:4)  ) Recent Results (from the past 240 hour(s))  SARS CORONAVIRUS 2 (TAT 6-24 HRS) Nasopharyngeal Nasopharyngeal Swab     Status: Abnormal   Collection Time: 07/23/19 10:53 AM    Specimen: Nasopharyngeal Swab  Result Value Ref Range Status   SARS Coronavirus 2 POSITIVE (A) NEGATIVE Final    Comment: RESULT CALLED TO, READ BACK BY AND VERIFIED WITH: EMAIL TO LORI BORDIC 1810 07/23/2019 WBOND (NOTE) SARS-CoV-2 target nucleic acids are DETECTED. The SARS-CoV-2 RNA is generally detectable in upper and lower respiratory specimens during the acute phase of infection. Positive results are indicative of the presence of SARS-CoV-2 RNA. Clinical correlation with patient history and other diagnostic information is  necessary to determine patient infection status. Positive results do not rule out bacterial infection or co-infection with other viruses.  The expected result is Negative. Fact Sheet for Patients: 07/25/2019 Fact Sheet for Healthcare Providers: HairSlick.no This test is not yet approved or cleared by the  [place] Architectural technologist and  has been authorized for detection and/or diagnosis of SARS-CoV-2 by FDA under an Print production planner (EUA). This EUA will remain  in effect (meaning this test can be used)  for the duration of the COVID-19 declaration under Section 564(b)(1) of the Act, 21 U.S.C. section 360bbb-3(b)(1), unless the authorization is terminated or revoked sooner. Performed at [facility], [place] [address] [place], [place] [address]   Blood Culture (routine x 2)     Status: None (Preliminary result)   Collection Time: 07/31/19  6:48 PM   Specimen: BLOOD  Result Value Ref Range Status   Specimen Description   Final    BLOOD RIGHT ANTECUBITAL Performed at [facility] [address] [place], [place] [address]    Special Requests   Final    BOTTLES DRAWN AEROBIC AND ANAEROBIC Blood Culture adequate volume Performed at [facility] [address], [place], [place] [address]    Culture   Final    NO GROWTH < 12 HOURS Performed at [facility] [address], [place], [place] [address]    Report Status PENDING  Incomplete         Radiology Studies: DG Chest Port 1 View  Result Date: 07/31/2019 CLINICAL DATA:  48 y.o female covid positive 3/16. Pt c/o SOB and cough. Hx HTN. Pt unable to take a deep inspiration for imaging. EXAM: PORTABLE CHEST 1 VIEW COMPARISON:  11/03/2016 FINDINGS: Left greater than right hazy airspace lung opacities in the mid to lower lungs predominantly, new since the prior exam, consistent with multifocal pneumonia. No convincing pleural effusion and no pneumothorax. Cardiac silhouette is normal in size. No mediastinal or hilar masses. Skeletal structures are grossly intact. IMPRESSION: 1. Hazy, left greater than right, bilateral airspace lung opacities consistent with multifocal pneumonia and in a pattern consistent COVID-19 pneumonia. Electronically Signed   By: Lajean Manes M.D.   On: 07/31/2019 17:52        Scheduled Meds: . vitamin C  500 mg Oral Daily  . enoxaparin (LOVENOX) injection  70 mg Subcutaneous Q24H  . furosemide  20 mg Oral Daily  . gabapentin  100 mg Oral BID  . lurasidone  40 mg Oral Daily  . methylPREDNISolone (SOLU-MEDROL) injection  40 mg Intravenous Q12H  . zinc sulfate  220 mg Oral Daily   Continuous Infusions: . remdesivir 100 mg in NS 100 mL Stopped (08/01/19 1025)  . tocilizumab (ACTEMRA) - non-COVID treatment       LOS: 1 day    Time spent: 35 minutes      Edwin Dada, MD [facility] 08/01/2019, 9:12 PM     Please page through [place]:  [email or web address] Contact charge nurse for password If 7PM-7AM, please contact night-coverage

## 2019-08-02 LAB — CBC WITH DIFFERENTIAL/PLATELET
Abs Immature Granulocytes: 0.02 10*3/uL (ref 0.00–0.07)
Basophils Absolute: 0 10*3/uL (ref 0.0–0.1)
Basophils Relative: 0 %
Eosinophils Absolute: 0 10*3/uL (ref 0.0–0.5)
Eosinophils Relative: 0 %
HCT: 41 % (ref 36.0–46.0)
Hemoglobin: 13 g/dL (ref 12.0–15.0)
Immature Granulocytes: 1 %
Lymphocytes Relative: 16 %
Lymphs Abs: 0.7 10*3/uL (ref 0.7–4.0)
MCH: 29.6 pg (ref 26.0–34.0)
MCHC: 31.7 g/dL (ref 30.0–36.0)
MCV: 93.4 fL (ref 80.0–100.0)
Monocytes Absolute: 0.2 10*3/uL (ref 0.1–1.0)
Monocytes Relative: 4 %
Neutro Abs: 3.5 10*3/uL (ref 1.7–7.7)
Neutrophils Relative %: 79 %
Platelets: 230 10*3/uL (ref 150–400)
RBC: 4.39 MIL/uL (ref 3.87–5.11)
RDW: 13.9 % (ref 11.5–15.5)
WBC: 4.4 10*3/uL (ref 4.0–10.5)
nRBC: 0 % (ref 0.0–0.2)

## 2019-08-02 LAB — COMPREHENSIVE METABOLIC PANEL
ALT: 48 U/L — ABNORMAL HIGH (ref 0–44)
AST: 59 U/L — ABNORMAL HIGH (ref 15–41)
Albumin: 2.9 g/dL — ABNORMAL LOW (ref 3.5–5.0)
Alkaline Phosphatase: 91 U/L (ref 38–126)
Anion gap: 10 (ref 5–15)
BUN: 13 mg/dL (ref 6–20)
CO2: 25 mmol/L (ref 22–32)
Calcium: 8.6 mg/dL — ABNORMAL LOW (ref 8.9–10.3)
Chloride: 106 mmol/L (ref 98–111)
Creatinine, Ser: 0.7 mg/dL (ref 0.44–1.00)
GFR calc Af Amer: >60 mL/min (ref >60)
GFR calc non Af Amer: >60 mL/min (ref >60)
Glucose, Bld: 161 mg/dL — ABNORMAL HIGH (ref 70–99)
Potassium: 3.7 mmol/L (ref 3.5–5.1)
Sodium: 141 mmol/L (ref 135–145)
Total Bilirubin: 0.4 mg/dL (ref 0.3–1.2)
Total Protein: 6.9 g/dL (ref 6.5–8.1)

## 2019-08-02 LAB — D-DIMER, QUANTITATIVE: D-Dimer, Quant: 0.63 ug/mL-FEU — ABNORMAL HIGH (ref 0.00–0.50)

## 2019-08-02 LAB — C-REACTIVE PROTEIN: CRP: 7.9 mg/dL — ABNORMAL HIGH (ref <1.0)

## 2019-08-02 LAB — GLUCOSE, CAPILLARY
Glucose-Capillary: 130 mg/dL — ABNORMAL HIGH (ref 70–99)
Glucose-Capillary: 154 mg/dL — ABNORMAL HIGH (ref 70–99)

## 2019-08-02 LAB — LACTATE DEHYDROGENASE: LDH: 320 U/L — ABNORMAL HIGH (ref 98–192)

## 2019-08-02 MED ORDER — LOPERAMIDE HCL 2 MG PO CAPS
2.0000 mg | ORAL_CAPSULE | ORAL | Status: DC | PRN
  Administered 2019-08-02: 2 mg via ORAL
  Filled 2019-08-02: qty 1

## 2019-08-02 NOTE — TOC Progression Note (Signed)
Transition of Care [facility]) - Progression Note    Patient Details  Name: Christie Walker MRN: [id number] Date of Birth: [birth year]  [facility] of [facility]) CM/SW Contact  Geni Bers, RN Phone Number: 08/02/2019, 4:02 PM  Clinical Narrative:    TOC will continue to follow for home health needs. Pt lives with parents.    Expected Discharge Plan: Home/Self Care Barriers to Discharge: No Barriers Identified  Expected Discharge Plan and Services Expected Discharge Plan: Home/Self Care       Living arrangements for the past 2 months: Single Family Home                                       Social Determinants of Health (SDOH) Interventions    Readmission Risk Interventions No flowsheet data found.

## 2019-08-02 NOTE — Progress Notes (Signed)
PROGRESS NOTE    Christie Walker  EYC:[id number] DOB: Nov 15, 1970 DOA: 07/31/2019 PCP: Vevelyn Francois, NP      Brief Narrative:  Ms Herbel is a 49 y.o. F with BMI 50, Bipolar in remission, DM and HTN who presented with 1 week fever, chills, cough, chest tightness/dyspnea and SOB with exertion.    In the ER, temp 100.33F, tachypneic, O2 sat 84% on room air.  CXR with bilateral opacities.  COVID+ on 3/16.       Assessment & Plan:  Coronavirus pneumonitis with acute hypoxic respiratory failure Presented with tachypnea, pneumonia and hypoxia and COVID positive test in the setting of the ongoing 2020-2021 COVID-19 pandemic.  She is S/p Bamlanivimab.  O2 weaned down yesterday but back up to 10L overnight.  CRP trending down  Tocilizumab given 3/25 -Continue remdesivir day 3 of 5 -Continue Solu-medrol -Continue VTE PPx with Lovenox -Continue Zinc and Vitamin C -Flutter valve, turn, cough, incentive spirometry q2hrs while awake    Hypertension BP elevated -Continue furosemide  Metabolic syndrome On metformin at home, no A1c in chart previously.  A1c 6.1%.   Glucose controlled here off insulin -BID CBG without SS corrections  Bipolar -Continue lurasidone, follow up med rec           Disposition: The patient was admitted with severe COVID-19.  Her oxygen requirements continue to go up, and she is breathing greater than 30 times per minute.  We will continue IV remdesivir, frequent lab monitoring, high level supplemental oxygen.  She will need ongoing hospital level care.           DVT prophylaxis: Lovenox Code Status: FULL Family Communication: Mother by phone MDM:  The below labs and imaging reports reviewed and summarized above.  Medication management as above.  This is a severe life-threatening illness.          Procedures:     Antimicrobials:      Culture data:   3/24 blood culture x2 -- NG yet       Subjective: She has  some frequent cough, no hemoptysis or sputum production, she feels tight in the chest, like she cannot take a deep breath, and somewhat out of breath, but no confusion, fever, vomiting.  No respiratory distress.  Objective: Vitals:   08/02/19 0621 08/02/19 1029 08/02/19 1228 08/02/19 1725  BP: (!) 153/91 118/74 (!) 149/96 (!) 146/73  Pulse: (!) 109 (!) 103 (!) 110 (!) 102  Resp:  (!) 42 (!) 48 (!) 24  Temp: 98.7 F (37.1 C) 97.9 F (36.6 C) 97.9 F (36.6 C) 98.5 F (36.9 C)  TempSrc: Oral Oral Oral Oral  SpO2: (!) 82% (!) 88% (!) 87% 90%  Weight:      Height:       No intake or output data in the 24 hours ending 08/02/19 1739 Filed Weights   07/31/19 1813 08/01/19 0052  Weight: (!) 142.9 kg (!) 137.7 kg    Examination: General appearance: Obese adult female, alert and in no obvious distress.  Recliner, eating cereal. HEENT: Anicteric, conjunctiva pink, lids and lashes normal. No nasal deformity, discharge, epistaxis.  Lips moist, teeth normal. OP moist, no oral lesions.  Hearing normal Skin: Warm and dry.  No suspicious rashes or lesions. Cardiac: Tachycardic, regular, no murmurs appreciated.  No LE edema.    Respiratory: Tachypneic, shallow, lung sounds diminished, CTAB without rales or wheezes. Abdomen: Abdomen soft.  No tenderness palpation or guarding. No ascites, distension, hepatosplenomegaly.  MSK: No deformities or effusions of the large joints of the upper or lower extremities bilaterally. Neuro: Awake and alert. Naming is grossly intact, and the patient's recall, recent and remote, as well as general fund of knowledge seem within normal limits.  Muscle tone normal, without fasciculations.  Moves all extremities equally and with normal coordination.  Speech fluent.    Psych: Sensorium intact and responding to questions, attention normal. Affect blunted.  Judgment and insight appear normal.            Data Reviewed: I have personally reviewed following labs and  imaging studies:  CBC: Recent Labs  Lab 07/31/19 1801 08/01/19 0420 08/02/19 0420  WBC 5.1 4.6 4.4  NEUTROABS 4.0 4.0 3.5  HGB 13.9 13.1 13.0  HCT 42.9 41.2 41.0  MCV 93.5 92.6 93.4  PLT 187 192 230   Basic Metabolic Panel: Recent Labs  Lab 07/31/19 1801 08/01/19 0420 08/02/19 0420  NA 139 139 141  K 3.6 3.5 3.7  CL 103 105 106  CO2 24 23 25   GLUCOSE 122* 151* 161*  BUN 9 9 13   CREATININE 0.89 0.66 0.70  CALCIUM 8.5* 8.4* 8.6*   GFR: Estimated Creatinine Clearance: 122.6 mL/min (by C-G formula based on SCr of 0.7 mg/dL). Liver Function Tests: Recent Labs  Lab 07/31/19 1801 08/01/19 0420 08/02/19 0420  AST 47* 62* 59*  ALT 35 40 48*  ALKPHOS 92 99 91  BILITOT 0.9 0.5 0.4  PROT 7.3 7.1 6.9  ALBUMIN 3.3* 3.1* 2.9*   No results for input(s): LIPASE, AMYLASE in the last 168 hours. No results for input(s): AMMONIA in the last 168 hours. Coagulation Profile: No results for input(s): INR, PROTIME in the last 168 hours. Cardiac Enzymes: No results for input(s): CKTOTAL, CKMB, CKMBINDEX, TROPONINI in the last 168 hours. BNP (last 3 results) No results for input(s): PROBNP in the last 8760 hours. HbA1C: Recent Labs    08/01/19 0420  HGBA1C 6.1*   CBG: Recent Labs  Lab 08/01/19 1704 08/02/19 0758 08/02/19 1720  GLUCAP 132* 130* 154*   Lipid Profile: Recent Labs    07/31/19 1801  TRIG 141   Thyroid Function Tests: No results for input(s): TSH, T4TOTAL, FREET4, T3FREE, THYROIDAB in the last 72 hours. Anemia Panel: Recent Labs    07/31/19 1801  FERRITIN 318*   Urine analysis:    Component Value Date/Time   COLORURINE YELLOW 08/26/2011 0251   APPEARANCEUR CLOUDY (A) 08/26/2011 0251   LABSPEC 1.025 01/26/2017 1508   PHURINE 6.0 01/26/2017 1508   GLUCOSEU NEGATIVE 01/26/2017 1508   HGBUR NEGATIVE 01/26/2017 1508   BILIRUBINUR neg 06/14/2019 1512   KETONESUR NEGATIVE 01/26/2017 1508   PROTEINUR Negative 06/14/2019 1512   PROTEINUR NEGATIVE  01/26/2017 1508   UROBILINOGEN 0.2 06/14/2019 1512   UROBILINOGEN 0.2 01/26/2017 1508   NITRITE neg 06/14/2019 1512   NITRITE NEGATIVE 01/26/2017 1508   LEUKOCYTESUR Negative 06/14/2019 1512   Sepsis Labs: @LABRCNTIP (procalcitonin:4,lacticacidven:4)  ) Recent Results (from the past 240 hour(s))  Blood Culture (routine x 2)     Status: None (Preliminary result)   Collection Time: 07/31/19  6:48 PM   Specimen: BLOOD  Result Value Ref Range Status   Specimen Description   Final    BLOOD RIGHT ANTECUBITAL Performed at [facility], [address], [place], [place]    Special Requests   Final    BOTTLES DRAWN AEROBIC AND ANAEROBIC Blood Culture adequate volume Performed at [facility], [address] [place], [place] [address]    Culture   Final    NO GROWTH 1 DAY Performed at [facility], [address], [place], [place] [address]    Report Status PENDING  Incomplete  Blood Culture (routine x 2)     Status: None (Preliminary result)   Collection Time: 08/01/19  4:20 AM   Specimen: BLOOD RIGHT HAND  Result Value Ref Range Status   Specimen Description   Final    BLOOD RIGHT HAND Performed at [facility], [address] [place], [place] [address]    Special Requests   Final    BOTTLES DRAWN AEROBIC AND ANAEROBIC Blood Culture adequate volume Performed at [facility], [address] [place], [place] [address]    Culture   Final    NO GROWTH < 24 HOURS Performed at [facility], [address], [place], [place] [address]    Report Status PENDING  Incomplete         Radiology Studies: DG Chest Port 1 View  Result Date: 07/31/2019 CLINICAL DATA:  49 y.o female covid positive 3/16. Pt c/o SOB and cough. Hx HTN. Pt unable to take a deep inspiration for imaging. EXAM: PORTABLE CHEST 1 VIEW COMPARISON:  11/03/2016 FINDINGS: Left greater than right hazy airspace lung  opacities in the mid to lower lungs predominantly, new since the prior exam, consistent with multifocal pneumonia. No convincing pleural effusion and no pneumothorax. Cardiac silhouette is normal in size. No mediastinal or hilar masses. Skeletal structures are grossly intact. IMPRESSION: 1. Hazy, left greater than right, bilateral airspace lung opacities consistent with multifocal pneumonia and in a pattern consistent COVID-19 pneumonia. Electronically Signed   By: Amie Portland M.D.   On: 07/31/2019 17:52        Scheduled Meds: . vitamin C  500 mg Oral Daily  . enoxaparin (LOVENOX) injection  70 mg Subcutaneous Q24H  . furosemide  20 mg Oral Daily  . gabapentin  100 mg Oral BID  . lurasidone  40 mg Oral Daily  . methylPREDNISolone (SOLU-MEDROL) injection  40 mg Intravenous Q12H  . zinc sulfate  220 mg Oral Daily   Continuous Infusions: . remdesivir 100 mg in NS 100 mL 100 mg (08/02/19 0850)     LOS: 2 days    Time spent: 35 minutes      Alberteen Sam, MD [facility] 08/02/2019, 5:39 PM     Please page through AMION:  [email or web address] Contact charge nurse for password If 7PM-7AM, please contact night-coverage

## 2019-08-03 ENCOUNTER — Inpatient Hospital Stay (HOSPITAL_COMMUNITY): Payer: 59

## 2019-08-03 DIAGNOSIS — R0789 Other chest pain: Secondary | ICD-10-CM

## 2019-08-03 DIAGNOSIS — I10 Essential (primary) hypertension: Secondary | ICD-10-CM

## 2019-08-03 LAB — COMPREHENSIVE METABOLIC PANEL
ALT: 42 U/L (ref 0–44)
AST: 41 U/L (ref 15–41)
Albumin: 3 g/dL — ABNORMAL LOW (ref 3.5–5.0)
Alkaline Phosphatase: 84 U/L (ref 38–126)
Anion gap: 13 (ref 5–15)
BUN: 16 mg/dL (ref 6–20)
CO2: 25 mmol/L (ref 22–32)
Calcium: 8.8 mg/dL — ABNORMAL LOW (ref 8.9–10.3)
Chloride: 105 mmol/L (ref 98–111)
Creatinine, Ser: 0.61 mg/dL (ref 0.44–1.00)
GFR calc Af Amer: >60 mL/min (ref >60)
GFR calc non Af Amer: >60 mL/min (ref >60)
Glucose, Bld: 167 mg/dL — ABNORMAL HIGH (ref 70–99)
Potassium: 3.9 mmol/L (ref 3.5–5.1)
Sodium: 143 mmol/L (ref 135–145)
Total Bilirubin: 0.4 mg/dL (ref 0.3–1.2)
Total Protein: 7 g/dL (ref 6.5–8.1)

## 2019-08-03 LAB — CBC WITH DIFFERENTIAL/PLATELET
Abs Immature Granulocytes: 0.02 10*3/uL (ref 0.00–0.07)
Basophils Absolute: 0 10*3/uL (ref 0.0–0.1)
Basophils Relative: 0 %
Eosinophils Absolute: 0 10*3/uL (ref 0.0–0.5)
Eosinophils Relative: 0 %
HCT: 42.9 % (ref 36.0–46.0)
Hemoglobin: 13.6 g/dL (ref 12.0–15.0)
Immature Granulocytes: 0 %
Lymphocytes Relative: 16 %
Lymphs Abs: 0.7 10*3/uL (ref 0.7–4.0)
MCH: 29.6 pg (ref 26.0–34.0)
MCHC: 31.7 g/dL (ref 30.0–36.0)
MCV: 93.3 fL (ref 80.0–100.0)
Monocytes Absolute: 0.3 10*3/uL (ref 0.1–1.0)
Monocytes Relative: 6 %
Neutro Abs: 3.6 10*3/uL (ref 1.7–7.7)
Neutrophils Relative %: 78 %
Platelets: 279 10*3/uL (ref 150–400)
RBC: 4.6 MIL/uL (ref 3.87–5.11)
RDW: 13.8 % (ref 11.5–15.5)
WBC: 4.6 10*3/uL (ref 4.0–10.5)
nRBC: 0 % (ref 0.0–0.2)

## 2019-08-03 LAB — C-REACTIVE PROTEIN: CRP: 2.3 mg/dL — ABNORMAL HIGH (ref <1.0)

## 2019-08-03 LAB — PROCALCITONIN: Procalcitonin: <0.1 ng/mL

## 2019-08-03 LAB — GLUCOSE, CAPILLARY
Glucose-Capillary: 145 mg/dL — ABNORMAL HIGH (ref 70–99)
Glucose-Capillary: 134 mg/dL — ABNORMAL HIGH (ref 70–99)

## 2019-08-03 LAB — LACTATE DEHYDROGENASE: LDH: 335 U/L — ABNORMAL HIGH (ref 98–192)

## 2019-08-03 LAB — D-DIMER, QUANTITATIVE: D-Dimer, Quant: 0.52 ug/mL-FEU — ABNORMAL HIGH (ref 0.00–0.50)

## 2019-08-03 MED ORDER — SODIUM CHLORIDE 0.9 % IV SOLN
100.0000 mg | Freq: Once | INTRAVENOUS | Status: AC
  Administered 2019-08-04: 100 mg via INTRAVENOUS
  Filled 2019-08-03: qty 20

## 2019-08-03 MED ORDER — SODIUM CHLORIDE (PF) 0.9 % IJ SOLN
INTRAMUSCULAR | Status: AC
  Filled 2019-08-03: qty 50

## 2019-08-03 MED ORDER — METHYLPREDNISOLONE SODIUM SUCC 40 MG IJ SOLR
40.0000 mg | Freq: Three times a day (TID) | INTRAMUSCULAR | Status: DC
  Administered 2019-08-03 – 2019-08-10 (×21): 40 mg via INTRAVENOUS
  Filled 2019-08-03 (×20): qty 1

## 2019-08-03 MED ORDER — IOHEXOL 350 MG/ML SOLN
100.0000 mL | Freq: Once | INTRAVENOUS | Status: AC | PRN
  Administered 2019-08-03: 100 mL via INTRAVENOUS

## 2019-08-03 NOTE — Progress Notes (Signed)
[facility]                                                                              Patient Demographics  Christie Walker, is a 49 y.o. female, DOB - 1970-12-22, GYJ:[id number]  Admit date - 07/31/2019   Admitting Physician John Giovanni, MD  Outpatient Primary MD for the patient is Barbette Merino, NP  Outpatient specialists:   LOS - 3  days   Medical records reviewed and are as summarized below:    Chief Complaint  Patient presents with  . Shortness of Breath       Brief summary   Christie Walker is a 49 y.o. F with BMI 50, Bipolar in remission, DM and HTN who presented with 1 week fever, chills, cough, chest tightness/dyspnea and SOB with exertion.   In the ER, temp 100.56F, tachypneic, O2 sat 84% on room air.  CXR with bilateral opacities.  COVID+ on 3/16.   Assessment & Plan    Principal Problem:  Acute hypoxic respiratory failure due to acute COVID-19 viral pneumonia during the ongoing COVID-19 pandemic- POA - Patient presented with fevers, tachypnea, hypoxia.  Chest x-ray showed bilateral opacities, Covid positive.  Respiratory failure likely also worsened due to obesity hypoventilation. - Currently hypoxic, requiring 14 L nasal cannula - Continue current management: IV Solu-Medrol, 40 mg every 12 hours, remdesivir per pharmacy, Actemra on 3/25 Decadron 6 mg IV daily, Remdesivir day #4  -Still significant hypoxia, obtain CT angiogram of the chest, CRP trending down. - Continue Supportive care: vitamin C/zinc, albuterol, Tylenol. - Continue to wean oxygen, ambulatory O2 screening daily as tolerated  - Oxygen - SpO2: 92 % O2 Flow Rate (L/min): 14 L/min - Continue to follow labs as below  Lab Results  Component Value Date   SARSCOV2NAA POSITIVE (A) 07/23/2019     Recent Labs  Lab 07/31/19 1801 08/01/19 0420 08/02/19 0420 08/03/19 0435  DDIMER 0.80* 0.88* 0.63* 0.52*  FERRITIN 318*  --   --   --   CRP 10.2* 11.8* 7.9* 2.3*  ALT  35 40 48* 42  PROCALCITON <0.10  --   --   --     Active Problems:    HTN (hypertension) -BP stable  Metabolic syndrome, diabetes mellitus type 2, NIDDM -Hemoglobin A1c 6.1, on Metformin at home -Continue sliding scale insulin  History of bipolar order Continue lurasidone   Morbid obesity Estimated body mass index is 50.52 kg/m as calculated from the following:   Height as of this encounter: 5\' 5"  (1.651 m).   Weight as of this encounter: 137.7 kg.  Code Status: Full CODE STATUS DVT Prophylaxis:  Lovenox  Family Communication: Discussed all imaging results, lab results, explained to the patient   Disposition Plan: Patient from home.  Anticipate discharge home once respiratory failure is improved, weaned O2.   Time Spent in minutes 35 minutes Procedures:  None  Consultants:   None  Antimicrobials:   Anti-infectives (From admission, onward)   Start     Dose/Rate Route Frequency Ordered Stop   08/01/19 1000  remdesivir 100 mg in sodium chloride 0.9 % 100 mL IVPB  Status:  Discontinued     100 mg 200 mL/hr over 30 Minutes Intravenous Daily 07/31/19 2117 07/31/19 2123   08/01/19 1000  remdesivir 100 mg in sodium chloride 0.9 % 100 mL IVPB     100 mg 200 mL/hr over 30 Minutes Intravenous Daily 07/31/19 2123 08/03/19 1028   07/31/19 2200  remdesivir 100 mg in sodium chloride 0.9 % 100 mL IVPB     100 mg 200 mL/hr over 30 Minutes Intravenous Every hour 07/31/19 2123 07/31/19 2245   07/31/19 2130  remdesivir 200 mg in sodium chloride 0.9% 250 mL IVPB  Status:  Discontinued     200 mg 580 mL/hr over 30 Minutes Intravenous Once 07/31/19 2117 07/31/19 2123          Medications  Scheduled Meds: . vitamin C  500 mg Oral Daily  . enoxaparin (LOVENOX) injection  70 mg Subcutaneous Q24H  . furosemide  20 mg Oral Daily  . gabapentin  100 mg Oral BID  . lurasidone  40 mg Oral Daily  . methylPREDNISolone (SOLU-MEDROL) injection  40 mg Intravenous Q12H  . sodium  chloride (PF)      . zinc sulfate  220 mg Oral Daily   Continuous Infusions: PRN Meds:.acetaminophen, albuterol, benzonatate, guaiFENesin-dextromethorphan, loperamide      Subjective:   [name] was seen and examined today.  Still significantly short of breath, on 14 L O2 via [place].  States short of breath on minimal exertion. Patient denies dizziness, chest pain, abdominal pain, N/V/D/C, new weakness, numbess, tingling. No acute events overnight.  No fevers  Objective:   Vitals:   08/02/19 1725 08/02/19 2004 08/02/19 2110 08/03/19 0502  BP: (!) 146/73  (!) 141/77 119/83  Pulse: (!) 102 (!) 106 98 87  Resp: (!) 24  20   Temp: 98.5 F (36.9 C)  98.4 F (36.9 C) 98.4 F (36.9 C)  TempSrc: Oral  Oral Oral  SpO2: 90%  91% 92%  Weight:      Height:       No intake or output data in the 24 hours ending 08/03/19 1414   Wt Readings from Last 3 Encounters:  08/01/19 (!) 137.7 kg  06/14/19 (!) 142.4 kg  12/14/18 (!) 136.5 kg     Exam  General: Alert and oriented x 3, NAD  Cardiovascular: S1 S2 auscultated, no murmurs, RRR  Respiratory: Diminished breath sounds, no wheezing  Gastrointestinal: Obese, soft, nontender, nondistended, + bowel sounds  Ext: no pedal edema bilaterally  Neuro: No new FND  Musculoskeletal: No digital cyanosis, clubbing  Skin: No rashes  Psych: Normal affect and demeanor, alert and oriented x3    Data Reviewed:  I have personally reviewed following labs and imaging studies  Micro Results Recent Results (from the past 240 hour(s))  Blood Culture (routine x 2)     Status: None (Preliminary result)   Collection Time: 07/31/19  6:48 PM   Specimen: BLOOD  Result Value Ref Range Status   Specimen Description   Final    BLOOD RIGHT ANTECUBITAL Performed at [facility], [address] [place], [place] [address]    Special Requests   Final    BOTTLES DRAWN AEROBIC AND ANAEROBIC Blood Culture adequate volume  Performed at [facility], [address], [place], [place] [address]    Culture   Final    NO GROWTH 1 DAY Performed at [facility], [address], [place], [place] [address]  Report Status PENDING  Incomplete  Blood Culture (routine x 2)     Status: None (Preliminary result)   Collection Time: 08/01/19  4:20 AM   Specimen: BLOOD RIGHT HAND  Result Value Ref Range Status   Specimen Description   Final    BLOOD RIGHT HAND Performed at [facility], [address] [place], [place] [address]    Special Requests   Final    BOTTLES DRAWN AEROBIC AND ANAEROBIC Blood Culture adequate volume Performed at [facility], [address], [place], [place] [address]    Culture   Final    NO GROWTH < 24 HOURS Performed at [facility], [address] [place], [place] [address]    Report Status PENDING  Incomplete    Radiology Reports DG Chest Port 1 View  Result Date: 07/31/2019 CLINICAL DATA:  49 y.o female covid positive 3/16. Pt c/o SOB and cough. Hx HTN. Pt unable to take a deep inspiration for imaging. EXAM: PORTABLE CHEST 1 VIEW COMPARISON:  11/03/2016 FINDINGS: Left greater than right hazy airspace lung opacities in the mid to lower lungs predominantly, new since the prior exam, consistent with multifocal pneumonia. No convincing pleural effusion and no pneumothorax. Cardiac silhouette is normal in size. No mediastinal or hilar masses. Skeletal structures are grossly intact. IMPRESSION: 1. Hazy, left greater than right, bilateral airspace lung opacities consistent with multifocal pneumonia and in a pattern consistent COVID-19 pneumonia. Electronically Signed   By: Amie Portland M.D.   On: 07/31/2019 17:52    Lab Data:  CBC: Recent Labs  Lab 07/31/19 1801 08/01/19 0420 08/02/19 0420 08/03/19 0435  WBC 5.1 4.6 4.4 4.6  NEUTROABS 4.0 4.0 3.5 3.6  HGB 13.9 13.1 13.0 13.6  HCT 42.9 41.2 41.0 42.9  MCV 93.5  92.6 93.4 93.3  PLT 187 192 230 279   Basic Metabolic Panel: Recent Labs  Lab 07/31/19 1801 08/01/19 0420 08/02/19 0420 08/03/19 0435  NA 139 139 141 143  K 3.6 3.5 3.7 3.9  CL 103 105 106 105  CO2 24 23 25 25   GLUCOSE 122* 151* 161* 167*  BUN 9 9 13 16   CREATININE 0.89 0.66 0.70 0.61  CALCIUM 8.5* 8.4* 8.6* 8.8*   GFR: Estimated Creatinine Clearance: 122.6 mL/min (by C-G formula based on SCr of 0.61 mg/dL). Liver Function Tests: Recent Labs  Lab 07/31/19 1801 08/01/19 0420 08/02/19 0420 08/03/19 0435  AST 47* 62* 59* 41  ALT 35 40 48* 42  ALKPHOS 92 99 91 84  BILITOT 0.9 0.5 0.4 0.4  PROT 7.3 7.1 6.9 7.0  ALBUMIN 3.3* 3.1* 2.9* 3.0*   No results for input(s): LIPASE, AMYLASE in the last 168 hours. No results for input(s): AMMONIA in the last 168 hours. Coagulation Profile: No results for input(s): INR, PROTIME in the last 168 hours. Cardiac Enzymes: No results for input(s): CKTOTAL, CKMB, CKMBINDEX, TROPONINI in the last 168 hours. BNP (last 3 results) No results for input(s): PROBNP in the last 8760 hours. HbA1C: Recent Labs    08/01/19 0420  HGBA1C 6.1*   CBG: Recent Labs  Lab 08/01/19 1704 08/02/19 0758 08/02/19 1720 08/03/19 0744  GLUCAP 132* 130* 154* 145*   Lipid Profile: Recent Labs    07/31/19 1801  TRIG 141   Thyroid Function Tests: No results for input(s): TSH, T4TOTAL, FREET4, T3FREE, THYROIDAB in the last 72 hours. Anemia Panel: Recent Labs    07/31/19 1801  FERRITIN 318*   Urine analysis:  Component Value Date/Time   COLORURINE YELLOW 08/26/2011 0251   APPEARANCEUR CLOUDY (A) 08/26/2011 0251   LABSPEC 1.025 01/26/2017 1508   PHURINE 6.0 01/26/2017 1508   GLUCOSEU NEGATIVE 01/26/2017 1508   HGBUR NEGATIVE 01/26/2017 1508   BILIRUBINUR neg 06/14/2019 1512   KETONESUR NEGATIVE 01/26/2017 1508   PROTEINUR Negative 06/14/2019 1512   PROTEINUR NEGATIVE 01/26/2017 1508   UROBILINOGEN 0.2 06/14/2019 1512   UROBILINOGEN 0.2  01/26/2017 1508   NITRITE neg 06/14/2019 1512   NITRITE NEGATIVE 01/26/2017 1508   LEUKOCYTESUR Negative 06/14/2019 [place] M.D. [facility] 08/03/2019, 2:14 PM   Call night coverage person covering after 7pm

## 2019-08-03 NOTE — Progress Notes (Signed)
CT called for pt for CT, pt IV leaking , Removed IV and unable to restart, IVT called for assistance. SP, RN

## 2019-08-04 ENCOUNTER — Inpatient Hospital Stay (HOSPITAL_COMMUNITY): Payer: 59

## 2019-08-04 DIAGNOSIS — J9600 Acute respiratory failure, unspecified whether with hypoxia or hypercapnia: Secondary | ICD-10-CM

## 2019-08-04 DIAGNOSIS — U071 COVID-19: Principal | ICD-10-CM

## 2019-08-04 DIAGNOSIS — J9601 Acute respiratory failure with hypoxia: Secondary | ICD-10-CM

## 2019-08-04 DIAGNOSIS — J1282 Pneumonia due to coronavirus disease 2019: Secondary | ICD-10-CM

## 2019-08-04 LAB — COMPREHENSIVE METABOLIC PANEL
ALT: 44 U/L (ref 0–44)
AST: 39 U/L (ref 15–41)
Albumin: 3.2 g/dL — ABNORMAL LOW (ref 3.5–5.0)
Alkaline Phosphatase: 86 U/L (ref 38–126)
Anion gap: 12 (ref 5–15)
BUN: 14 mg/dL (ref 6–20)
CO2: 28 mmol/L (ref 22–32)
Calcium: 9.2 mg/dL (ref 8.9–10.3)
Chloride: 106 mmol/L (ref 98–111)
Creatinine, Ser: 0.82 mg/dL (ref 0.44–1.00)
GFR calc Af Amer: >60 mL/min (ref >60)
GFR calc non Af Amer: >60 mL/min (ref >60)
Glucose, Bld: 157 mg/dL — ABNORMAL HIGH (ref 70–99)
Potassium: 4 mmol/L (ref 3.5–5.1)
Sodium: 146 mmol/L — ABNORMAL HIGH (ref 135–145)
Total Bilirubin: 0.5 mg/dL (ref 0.3–1.2)
Total Protein: 7.4 g/dL (ref 6.5–8.1)

## 2019-08-04 LAB — CBC WITH DIFFERENTIAL/PLATELET
Abs Immature Granulocytes: 0.05 10*3/uL (ref 0.00–0.07)
Basophils Absolute: 0 10*3/uL (ref 0.0–0.1)
Basophils Relative: 0 %
Eosinophils Absolute: 0 10*3/uL (ref 0.0–0.5)
Eosinophils Relative: 0 %
HCT: 45.2 % (ref 36.0–46.0)
Hemoglobin: 14.3 g/dL (ref 12.0–15.0)
Immature Granulocytes: 1 %
Lymphocytes Relative: 20 %
Lymphs Abs: 1.1 10*3/uL (ref 0.7–4.0)
MCH: 29.5 pg (ref 26.0–34.0)
MCHC: 31.6 g/dL (ref 30.0–36.0)
MCV: 93.2 fL (ref 80.0–100.0)
Monocytes Absolute: 0.4 10*3/uL (ref 0.1–1.0)
Monocytes Relative: 8 %
Neutro Abs: 4.1 10*3/uL (ref 1.7–7.7)
Neutrophils Relative %: 71 %
Platelets: 345 10*3/uL (ref 150–400)
RBC: 4.85 MIL/uL (ref 3.87–5.11)
RDW: 13.8 % (ref 11.5–15.5)
WBC: 5.7 10*3/uL (ref 4.0–10.5)
nRBC: 0 % (ref 0.0–0.2)

## 2019-08-04 LAB — D-DIMER, QUANTITATIVE: D-Dimer, Quant: 0.65 ug/mL-FEU — ABNORMAL HIGH (ref 0.00–0.50)

## 2019-08-04 LAB — GLUCOSE, CAPILLARY
Glucose-Capillary: 156 mg/dL — ABNORMAL HIGH (ref 70–99)
Glucose-Capillary: 204 mg/dL — ABNORMAL HIGH (ref 70–99)

## 2019-08-04 LAB — C-REACTIVE PROTEIN: CRP: 1.1 mg/dL — ABNORMAL HIGH (ref <1.0)

## 2019-08-04 LAB — LACTATE DEHYDROGENASE: LDH: 333 U/L — ABNORMAL HIGH (ref 98–192)

## 2019-08-04 LAB — BRAIN NATRIURETIC PEPTIDE: B Natriuretic Peptide: 31.5 pg/mL (ref 0.0–100.0)

## 2019-08-04 MED ORDER — FUROSEMIDE 10 MG/ML IJ SOLN
INTRAMUSCULAR | Status: AC
  Filled 2019-08-04: qty 4

## 2019-08-04 MED ORDER — FUROSEMIDE 10 MG/ML IJ SOLN
40.0000 mg | Freq: Once | INTRAMUSCULAR | Status: AC
  Administered 2019-08-04: 40 mg via INTRAVENOUS

## 2019-08-04 NOTE — Plan of Care (Signed)
  Problem: Education: Goal: Knowledge of risk factors and measures for prevention of condition will improve Outcome: Progressing   Problem: Coping: Goal: Psychosocial and spiritual needs will be supported Outcome: Progressing   Problem: Respiratory: Goal: Will maintain a patent airway Outcome: Progressing Goal: Complications related to the disease process, condition or treatment will be avoided or minimized Outcome: Progressing   

## 2019-08-04 NOTE — Progress Notes (Signed)
[facility]                                                                              Patient Demographics  Christie Walker, is a 49 y.o. female, DOB - 1971-02-11, KCM:[id number]  Admit date - 07/31/2019   Admitting Physician John Giovanni, MD  Outpatient Primary MD for the patient is Barbette Merino, NP  Outpatient specialists:   LOS - 4  days   Medical records reviewed and are as summarized below:    Chief Complaint  Patient presents with  . Shortness of Breath       Brief summary   [name] is a 49 y.o. F with BMI 50, Bipolar in remission, DM and HTN who presented with 1 week fever, chills, cough, chest tightness/dyspnea and SOB with exertion.   In the ER, temp 100.52F, tachypneic, O2 sat 84% on room air.  CXR with bilateral opacities.  COVID+ on 3/16.   Assessment & Plan    Principal Problem:  Acute hypoxic respiratory failure due to acute COVID-19 viral pneumonia during the ongoing COVID-19 pandemic- POA - Patient presented with fevers, tachypnea, hypoxia.  Chest x-ray showed bilateral opacities, Covid positive.  Respiratory failure likely also worsened due to obesity hypoventilation. -Still hypoxic, O2 sats 88% on 15 L however desats quickly to 70s on minimal exertion.  Difficult to wean down, requested CCM consult, discussed with Dr. Chestine Spore -Continue IV Solu-Medrol, remdesivir per pharmacy, day #5 today. Received Actemra on 3/25.   -CT angiogram negative for PE however showed extensive groundglass opacities scattered throughout both lungs -I's and O's +920 cc, keep negative balance and dry.  Obtain portable chest x-ray, BNP, will give 40 mg Lasix IV x1, will check echo  (last echo 09/2016 had shown EF of 55 to 60%, normal diastolic function). - Continue Supportive care: vitamin C/zinc, albuterol, Tylenol. - Continue to wean oxygen, ambulatory O2 screening daily as tolerated  - Oxygen - SpO2: (!) 88 % O2 Flow Rate (L/min): 15 L/min -  Continue to follow labs as below, inflammatory markers are improving  Lab Results  Component Value Date   SARSCOV2NAA POSITIVE (A) 07/23/2019     Recent Labs  Lab 07/31/19 1801 08/01/19 0420 08/02/19 0420 08/03/19 0435 08/03/19 1617 08/04/19 0404  DDIMER 0.80* 0.88* 0.63* 0.52*  --  0.65*  FERRITIN 318*  --   --   --   --   --   CRP 10.2* 11.8* 7.9* 2.3*  --  1.1*  ALT 35 40 48* 42  --  44  PROCALCITON <0.10  --   --   --  <0.10  --     Active Problems:    HTN (hypertension) -BP is currently stable  Metabolic syndrome, diabetes mellitus type 2, NIDDM -Hemoglobin A1c 6.1, on Metformin at home -Continue sliding scale insulin  History of bipolar order Continue lurasidone   Morbid obesity Estimated body mass index is 50.52 kg/m as calculated from the following:   Height as of this encounter: 5\' 5"  (1.651 m).   Weight as of this encounter: 137.7 kg.  Code Status: Full CODE STATUS DVT Prophylaxis:  Lovenox  Family Communication: Discussed all imaging results, lab results, explained to the patient, mother and rest of the family on the speaker phone  Disposition Plan: Patient from home.  Anticipate discharge home once respiratory failure is improved, weaned O2.   Time Spent in minutes 35 minutes Procedures:  None  Consultants:   None  Antimicrobials:   Anti-infectives (From admission, onward)   Start     Dose/Rate Route Frequency Ordered Stop   08/04/19 1000  remdesivir 100 mg in sodium chloride 0.9 % 100 mL IVPB     100 mg 200 mL/hr over 30 Minutes Intravenous  Once 08/03/19 1511 08/04/19 1210   08/01/19 1000  remdesivir 100 mg in sodium chloride 0.9 % 100 mL IVPB  Status:  Discontinued     100 mg 200 mL/hr over 30 Minutes Intravenous Daily 07/31/19 2117 07/31/19 2123   08/01/19 1000  remdesivir 100 mg in sodium chloride 0.9 % 100 mL IVPB     100 mg 200 mL/hr over 30 Minutes Intravenous Daily 07/31/19 2123 08/03/19 1028   07/31/19 2200  remdesivir 100 mg in  sodium chloride 0.9 % 100 mL IVPB     100 mg 200 mL/hr over 30 Minutes Intravenous Every hour 07/31/19 2123 07/31/19 2245   07/31/19 2130  remdesivir 200 mg in sodium chloride 0.9% 250 mL IVPB  Status:  Discontinued     200 mg 580 mL/hr over 30 Minutes Intravenous Once 07/31/19 2117 07/31/19 2123         Medications  Scheduled Meds: . vitamin C  500 mg Oral Daily  . enoxaparin (LOVENOX) injection  70 mg Subcutaneous Q24H  . furosemide  20 mg Oral Daily  . gabapentin  100 mg Oral BID  . lurasidone  40 mg Oral Daily  . methylPREDNISolone (SOLU-MEDROL) injection  40 mg Intravenous Q8H  . zinc sulfate  220 mg Oral Daily   Continuous Infusions: PRN Meds:.acetaminophen, albuterol, benzonatate, guaiFENesin-dextromethorphan, loperamide      Subjective:   Christie Walker was seen and examined today.  Of breath, on minimal exertion, desats to 70s.  On 14 L O2 in the morning, increased to 15 L.  No chest pain. Patient denies dizziness, chest pain, abdominal pain, N/V/D/C, new weakness, numbess, tingling. No acute events overnight.  No fevers.  Objective:   Vitals:   08/04/19 1114 08/04/19 1124 08/04/19 1150 08/04/19 1209  BP:  140/87 (!) 145/97   Pulse:  (!) 116 (!) 109 (!) 104  Resp: (!) 36 (!) 36 (!) 34   Temp:  98 F (36.7 C) 98.2 F (36.8 C) 98.2 F (36.8 C)  TempSrc:  Oral Oral Oral  SpO2:  (!) 87% (!) 87% (!) 88%  Weight:      Height:        Intake/Output Summary (Last 24 hours) at 08/04/2019 1217 Last data filed at 08/04/2019 1034 Gross per 24 hour  Intake 360 ml  Output --  Net 360 ml     Wt Readings from Last 3 Encounters:  08/01/19 (!) 137.7 kg  06/14/19 (!) 142.4 kg  12/14/18 (!) 136.5 kg    Physical Exam  General: Alert and oriented x 3, NAD  Cardiovascular: S1 S2 clear, RRR.   Respiratory: Diminished breath sounds throughout with wheezing  Gastrointestinal: Morbidly obese, soft, nontender, nondistended, NBS  Ext: no pedal edema  bilaterally  Neuro: no new deficits  Musculoskeletal: No cyanosis, clubbing  Skin: No rashes  Psych: Normal affect and demeanor, alert and oriented  x3    Data Reviewed:  I have personally reviewed following labs and imaging studies  Micro Results Recent Results (from the past 240 hour(s))  Blood Culture (routine x 2)     Status: None (Preliminary result)   Collection Time: 07/31/19  6:48 PM   Specimen: BLOOD  Result Value Ref Range Status   Specimen Description   Final    BLOOD RIGHT ANTECUBITAL Performed at [facility], [address], [place], [place] [address]    Special Requests   Final    BOTTLES DRAWN AEROBIC AND ANAEROBIC Blood Culture adequate volume Performed at [facility], [address] [place], [place] [address]    Culture   Final    NO GROWTH 3 DAYS Performed at [facility], [address], [place], [place] [address]    Report Status PENDING  Incomplete  Blood Culture (routine x 2)     Status: None (Preliminary result)   Collection Time: 08/01/19  4:20 AM   Specimen: BLOOD RIGHT HAND  Result Value Ref Range Status   Specimen Description   Final    BLOOD RIGHT HAND Performed at [facility], [address] [place], [place] [address]    Special Requests   Final    BOTTLES DRAWN AEROBIC AND ANAEROBIC Blood Culture adequate volume Performed at [facility], [address] [place], [place] [address]    Culture   Final    NO GROWTH 3 DAYS Performed at [facility], [address], [place], [place] [address]    Report Status PENDING  Incomplete    Radiology Reports CT ANGIO CHEST PE W OR WO CONTRAST  Result Date: 08/03/2019 CLINICAL DATA:  Shortness of breath, COVID, hypoxia EXAM: CT ANGIOGRAPHY CHEST WITH CONTRAST TECHNIQUE: Multidetector CT imaging of the chest was performed using the standard protocol during bolus administration of intravenous contrast.  Multiplanar CT image reconstructions and MIPs were obtained to evaluate the vascular anatomy. CONTRAST:  OMNIPAQUE IOHEXOL 350 MG/ML SOLN COMPARISON:  None. FINDINGS: Cardiovascular: Heart size normal. No pericardial effusion. Satisfactory opacification of pulmonary arteries noted, and there is no evidence of pulmonary emboli. Adequate contrast opacification of the thoracic aorta with no evidence of dissection, aneurysm, or stenosis. There is classic 3-vessel brachiocephalic arch anatomy without proximal stenosis. No significant atheromatous change. Mediastinum/Nodes: No significant hilar or mediastinal adenopathy. Lungs/Pleura: No pleural effusion. No pneumothorax. Extensive ground-glass opacities scattered throughout both lungs, involving bases more than apices. Airspace consolidation posteriorly in both lower lobes with peripheral air bronchograms. Upper Abdomen: No acute findings. Musculoskeletal: No chest wall abnormality. No acute or significant osseous findings. Review of the MIP images confirms the above findings. IMPRESSION: 1. Negative for acute PE or thoracic aortic dissection. 2. Extensive ground-glass opacities scattered throughout both lungs, with consolidation posteriorly in both lower lobes, consistent with atypical/viral pneumonia. Electronically Signed   By: Corlis Leak M.D.   On: 08/03/2019 14:22   DG Chest Port 1 View  Result Date: 07/31/2019 CLINICAL DATA:  49 y.o female covid positive 3/16. Pt c/o SOB and cough. Hx HTN. Pt unable to take a deep inspiration for imaging. EXAM: PORTABLE CHEST 1 VIEW COMPARISON:  11/03/2016 FINDINGS: Left greater than right hazy airspace lung opacities in the mid to lower lungs predominantly, new since the prior exam, consistent with multifocal pneumonia. No convincing pleural effusion and no pneumothorax. Cardiac silhouette is normal in size. No mediastinal or hilar masses. Skeletal structures are  grossly intact. IMPRESSION: 1. Hazy, left greater than  right, bilateral airspace lung opacities consistent with multifocal pneumonia and in a pattern consistent COVID-19 pneumonia. Electronically Signed   By: Amie Portland M.D.   On: 07/31/2019 17:52    Lab Data:  CBC: Recent Labs  Lab 07/31/19 1801 08/01/19 0420 08/02/19 0420 08/03/19 0435 08/04/19 0404  WBC 5.1 4.6 4.4 4.6 5.7  NEUTROABS 4.0 4.0 3.5 3.6 4.1  HGB 13.9 13.1 13.0 13.6 14.3  HCT 42.9 41.2 41.0 42.9 45.2  MCV 93.5 92.6 93.4 93.3 93.2  PLT 187 192 230 279 345   Basic Metabolic Panel: Recent Labs  Lab 07/31/19 1801 08/01/19 0420 08/02/19 0420 08/03/19 0435 08/04/19 0404  NA 139 139 141 143 146*  K 3.6 3.5 3.7 3.9 4.0  CL 103 105 106 105 106  CO2 24 23 25 25 28   GLUCOSE 122* 151* 161* 167* 157*  BUN 9 9 13 16 14   CREATININE 0.89 0.66 0.70 0.61 0.82  CALCIUM 8.5* 8.4* 8.6* 8.8* 9.2   GFR: Estimated Creatinine Clearance: 119.6 mL/min (by C-G formula based on SCr of 0.82 mg/dL). Liver Function Tests: Recent Labs  Lab 07/31/19 1801 08/01/19 0420 08/02/19 0420 08/03/19 0435 08/04/19 0404  AST 47* 62* 59* 41 39  ALT 35 40 48* 42 44  ALKPHOS 92 99 91 84 86  BILITOT 0.9 0.5 0.4 0.4 0.5  PROT 7.3 7.1 6.9 7.0 7.4  ALBUMIN 3.3* 3.1* 2.9* 3.0* 3.2*   No results for input(s): LIPASE, AMYLASE in the last 168 hours. No results for input(s): AMMONIA in the last 168 hours. Coagulation Profile: No results for input(s): INR, PROTIME in the last 168 hours. Cardiac Enzymes: No results for input(s): CKTOTAL, CKMB, CKMBINDEX, TROPONINI in the last 168 hours. BNP (last 3 results) No results for input(s): PROBNP in the last 8760 hours. HbA1C: No results for input(s): HGBA1C in the last 72 hours. CBG: Recent Labs  Lab 08/02/19 0758 08/02/19 1720 08/03/19 0744 08/03/19 1726 08/04/19 0747  GLUCAP 130* 154* 145* 134* 156*   Lipid Profile: No results for input(s): CHOL, HDL, LDLCALC, TRIG, CHOLHDL, LDLDIRECT in the last 72 hours. Thyroid Function Tests: No  results for input(s): TSH, T4TOTAL, FREET4, T3FREE, THYROIDAB in the last 72 hours. Anemia Panel: No results for input(s): VITAMINB12, FOLATE, FERRITIN, TIBC, IRON, RETICCTPCT in the last 72 hours. Urine analysis:    Component Value Date/Time   COLORURINE YELLOW 08/26/2011 0251   APPEARANCEUR CLOUDY (A) 08/26/2011 0251   LABSPEC 1.025 01/26/2017 1508   PHURINE 6.0 01/26/2017 1508   GLUCOSEU NEGATIVE 01/26/2017 1508   HGBUR NEGATIVE 01/26/2017 1508   BILIRUBINUR neg 06/14/2019 1512   KETONESUR NEGATIVE 01/26/2017 1508   PROTEINUR Negative 06/14/2019 1512   PROTEINUR NEGATIVE 01/26/2017 1508   UROBILINOGEN 0.2 06/14/2019 1512   UROBILINOGEN 0.2 01/26/2017 1508   NITRITE neg 06/14/2019 1512   NITRITE NEGATIVE 01/26/2017 1508   LEUKOCYTESUR Negative 06/14/2019 1512     Kaicee Scarpino M.D. [facility] 08/04/2019, 12:17 PM   Call night coverage person covering after 7pm

## 2019-08-04 NOTE — Plan of Care (Signed)
Patient seen and examined. No increased WOB, talking on the phone comfortably. Unable to reach 500cc with IS. Has barely been OOB. Happy hypoxic physiology.   Recommendations:  Ok to remain on the floor Awake prone OOB mobility IS con't supportive care Decision for intubation should be made based on work of breathing.   Full consult note to follow.  Steffanie Dunn, DO 08/04/19 1:43 PM [facility]

## 2019-08-04 NOTE — Consult Note (Signed)
NAME:  Christie Walker, MRN:  [id number], DOB:  02-Jul-1970, LOS: 4 ADMISSION DATE:  07/31/2019, CONSULTATION DATE:  3/28 REFERRING MD:  Tana Coast, CHIEF COMPLAINT:  hypoxia   Brief History   Covid  Hypoxic respiratory failure  History of present illness   Christie Walker is a 49 y/o woman admitted on 3/24 with acute hypoxic respiratory failure due to covid-19. She complained of fevers, chills, cough, SOB, diarrhea prior to admission. Since admission she has been treated with steroids, remdesivir. Received tocilizumab on 3/25. She has required increasing oxygen since admission, today requiring 14L HFNC. She denies significant SOB. She has a dry cough. No edema.    Past Medical History  GERD Bipolar  HTN  Significant Hospital Events     Consults:  PCCM  Procedures:    Significant Diagnostic Tests:  CTA chest 3/27-diffuse bilateral patchy infiltrates, no PE. No significant mediastinal or hilar adenopathy.  Micro Data:  3/24 blood cultures>> Covid positive Antimicrobials:  3/24-3/28 remdesivir  Interim history/subjective:    Objective   Blood pressure (!) 145/97, pulse (!) 109, temperature 98.2 F (36.8 C), temperature source Oral, resp. rate (!) 34, height 5\' 5"  (1.651 m), weight (!) 137.7 kg, last menstrual period 07/31/2019, SpO2 (!) 87 %.        Intake/Output Summary (Last 24 hours) at 08/04/2019 1202 Last data filed at 08/04/2019 1034 Gross per 24 hour  Intake 360 ml  Output --  Net 360 ml   Filed Weights   07/31/19 1813 08/01/19 0052  Weight: (!) 142.9 kg (!) 137.7 kg    Examination: General: Healthy-appearing middle-aged woman sitting up in bed talking on phone in no acute distress HENT: [place]/AT, eyes anicteric Lungs: Bilateral rales, no tachypnea or accessory muscle use. Saturations around 92% on 14L O2. Cardiovascular: regular rate & rhythm Abdomen:   Obese, nontender, nondistended Extremities: No clubbing, cyanosis, edema Neuro: Awake and alert, flattened  affect.  Moving all extremities spontaneously and answering questions appropriately.  Using incentive spirometer, able to get to 500-750cc Derm : No rashes or lesions  CXR 3/28 personally reviewed-bilateral airspace disease, most notable in the left lower lobe  Resolved Hospital Problem list     Assessment & Plan:  Acute hypoxic respiratory failure due to COVID-19 viral pneumonia -Completed remdesivir today -Dexamethasone for 10 days -Previously received Actemra on 3/25 -Supplemental oxygen as required to maintain SPO2 greater than 85%.   -Decision to intubate should be based on work of breathing. -Recommend awake proning and bed mobility is much as possible -Incentive spirometry hourly -With normal BNP and no peripheral edema, likely no role for diuresis -With normal procalcitonin on 3/24 and 3/27, no role for antibiotics. -Discussed with patient that Covid pneumonia is slow to resolve and she is likely to remain in the hospital for several more days if not  > 1 week.   Please call with questions. Stable to remain on the floor.  Best practice:  Per primary  Labs   CBC: Recent Labs  Lab 07/31/19 1801 08/01/19 0420 08/02/19 0420 08/03/19 0435 08/04/19 0404  WBC 5.1 4.6 4.4 4.6 5.7  NEUTROABS 4.0 4.0 3.5 3.6 4.1  HGB 13.9 13.1 13.0 13.6 14.3  HCT 42.9 41.2 41.0 42.9 45.2  MCV 93.5 92.6 93.4 93.3 93.2  PLT 187 192 230 279 237    Basic Metabolic Panel: Recent Labs  Lab 07/31/19 1801 08/01/19 0420 08/02/19 0420 08/03/19 0435 08/04/19 0404  NA 139 139 141 143 146*  K 3.6 3.5 3.7  3.9 4.0  CL 103 105 106 105 106  CO2 24 23 25 25 28   GLUCOSE 122* 151* 161* 167* 157*  BUN 9 9 13 16 14   CREATININE 0.89 0.66 0.70 0.61 0.82  CALCIUM 8.5* 8.4* 8.6* 8.8* 9.2   GFR: Estimated Creatinine Clearance: 119.6 mL/min (by C-G formula based on SCr of 0.82 mg/dL). Recent Labs  Lab 07/31/19 1801 07/31/19 1801 08/01/19 0420 08/02/19 0420 08/03/19 0435 08/03/19 1617  08/04/19 0404  PROCALCITON <0.10  --   --   --   --  <0.10  --   WBC 5.1   < > 4.6 4.4 4.6  --  5.7  LATICACIDVEN 1.2  --   --   --   --   --   --    < > = values in this interval not displayed.    Liver Function Tests: Recent Labs  Lab 07/31/19 1801 08/01/19 0420 08/02/19 0420 08/03/19 0435 08/04/19 0404  AST 47* 62* 59* 41 39  ALT 35 40 48* 42 44  ALKPHOS 92 99 91 84 86  BILITOT 0.9 0.5 0.4 0.4 0.5  PROT 7.3 7.1 6.9 7.0 7.4  ALBUMIN 3.3* 3.1* 2.9* 3.0* 3.2*   No results for input(s): LIPASE, AMYLASE in the last 168 hours. No results for input(s): AMMONIA in the last 168 hours.  ABG    Component Value Date/Time   TCO2 24 08/26/2011 0239     Coagulation Profile: No results for input(s): INR, PROTIME in the last 168 hours.  Cardiac Enzymes: No results for input(s): CKTOTAL, CKMB, CKMBINDEX, TROPONINI in the last 168 hours.  HbA1C: Hemoglobin A1C  Date/Time Value Ref Range Status  06/14/2019 03:11 PM 5.4 4.0 - 5.6 % Final  12/14/2018 02:47 PM 5.6 4.0 - 5.6 % Final   Hgb A1c MFr Bld  Date/Time Value Ref Range Status  08/01/2019 04:20 AM 6.1 (H) 4.8 - 5.6 % Final    Comment:    (NOTE) Pre diabetes:          5.7%-6.4% Diabetes:              >6.4% Glycemic control for   <7.0% adults with diabetes     CBG: Recent Labs  Lab 08/02/19 0758 08/02/19 1720 08/03/19 0744 08/03/19 1726 08/04/19 0747  GLUCAP 130* 154* 145* 134* 156*    Review of Systems:   No n/v. +Cough, SOB. No sputum production. No CP or edema. Otherwise ROS negative.  Past Medical History  She,  has a past medical history of Acid reflux, Bipolar 1 disorder (HCC), and Essential hypertension (06/14/2016).   Surgical History   History reviewed. No pertinent surgical history.   Social History   reports that she has never smoked. She has never used smokeless tobacco. She reports that she does not drink alcohol or use drugs.   Family History   Her family history includes Mental illness in  an other family member.   Allergies Allergies  Allergen Reactions  . Bee Venom Swelling    Swelling at site   . Amoxicillin Hives and Other (See Comments)    Patient states that she can take this she just has a sensitivity when its taken along with pain medication  . Hydrocodone Hives and Other (See Comments)    Sensitivity when taken along with antibiotics per patient  . Insect Extract Allergy Skin Test Other (See Comments)    Spiders - make tongue burn  . Propoxyphene N-Acetaminophen Nausea And Vomiting and Rash  Home Medications  Prior to Admission medications   Medication Sig Start Date End Date Taking? Authorizing Provider  acetaminophen (TYLENOL) 325 MG tablet Take 650 mg by mouth every 6 (six) hours as needed for mild pain or headache.   Yes [provider]  albuterol (PROVENTIL HFA;VENTOLIN HFA) 108 (90 Base) MCG/ACT inhaler Inhale 2 puffs into the lungs every 6 (six) hours as needed for wheezing or shortness of breath.   Yes [provider]  furosemide (LASIX) 20 MG tablet TAKE 1 TABLET(20 MG) BY MOUTH DAILY Patient taking differently: Take 20 mg by mouth daily.  06/05/19  Yes Quentin Angst, MD  gabapentin (NEURONTIN) 100 MG capsule Take 100 mg by mouth 2 (two) times daily.    Yes [provider]  IRON PO Take 1 tablet by mouth daily.   Yes [provider]  lurasidone (LATUDA) 40 MG TABS tablet Take 40 mg by mouth daily.   Yes [provider]  metFORMIN (GLUCOPHAGE) 500 MG tablet Take 1 tablet (500 mg total) by mouth daily with breakfast. 01/11/19  Yes Mike Gip, FNP  Vitamin D, Ergocalciferol, (DRISDOL) 1.25 MG (50000 UNIT) CAPS capsule TAKE 1 CAPSULE BY MOUTH EVERY 7 DAYS Patient taking differently: Take 50,000 Units by mouth every 7 (seven) days. TAKE 1 CAPSULE BY MOUTH EVERY 7 DAYS 07/29/19  Yes Quentin Angst, MD     Steffanie Dunn, DO 08/04/19 6:01 PM [facility]

## 2019-08-05 ENCOUNTER — Inpatient Hospital Stay (HOSPITAL_COMMUNITY): Payer: 59

## 2019-08-05 DIAGNOSIS — R0602 Shortness of breath: Secondary | ICD-10-CM

## 2019-08-05 LAB — COMPREHENSIVE METABOLIC PANEL
ALT: 48 U/L — ABNORMAL HIGH (ref 0–44)
AST: 34 U/L (ref 15–41)
Albumin: 3.3 g/dL — ABNORMAL LOW (ref 3.5–5.0)
Alkaline Phosphatase: 85 U/L (ref 38–126)
Anion gap: 12 (ref 5–15)
BUN: 17 mg/dL (ref 6–20)
CO2: 28 mmol/L (ref 22–32)
Calcium: 9.2 mg/dL (ref 8.9–10.3)
Chloride: 103 mmol/L (ref 98–111)
Creatinine, Ser: 0.83 mg/dL (ref 0.44–1.00)
GFR calc Af Amer: >60 mL/min (ref >60)
GFR calc non Af Amer: >60 mL/min (ref >60)
Glucose, Bld: 184 mg/dL — ABNORMAL HIGH (ref 70–99)
Potassium: 3.5 mmol/L (ref 3.5–5.1)
Sodium: 143 mmol/L (ref 135–145)
Total Bilirubin: 0.4 mg/dL (ref 0.3–1.2)
Total Protein: 7.4 g/dL (ref 6.5–8.1)

## 2019-08-05 LAB — GLUCOSE, CAPILLARY
Glucose-Capillary: 223 mg/dL — ABNORMAL HIGH (ref 70–99)
Glucose-Capillary: 179 mg/dL — ABNORMAL HIGH (ref 70–99)
Glucose-Capillary: 215 mg/dL — ABNORMAL HIGH (ref 70–99)
Glucose-Capillary: 187 mg/dL — ABNORMAL HIGH (ref 70–99)

## 2019-08-05 LAB — CBC WITH DIFFERENTIAL/PLATELET
Abs Immature Granulocytes: 0.11 10*3/uL — ABNORMAL HIGH (ref 0.00–0.07)
Basophils Absolute: 0 10*3/uL (ref 0.0–0.1)
Basophils Relative: 0 %
Eosinophils Absolute: 0 10*3/uL (ref 0.0–0.5)
Eosinophils Relative: 0 %
HCT: 48.3 % — ABNORMAL HIGH (ref 36.0–46.0)
Hemoglobin: 15.4 g/dL — ABNORMAL HIGH (ref 12.0–15.0)
Immature Granulocytes: 2 %
Lymphocytes Relative: 12 %
Lymphs Abs: 0.8 10*3/uL (ref 0.7–4.0)
MCH: 29.2 pg (ref 26.0–34.0)
MCHC: 31.9 g/dL (ref 30.0–36.0)
MCV: 91.7 fL (ref 80.0–100.0)
Monocytes Absolute: 0.2 10*3/uL (ref 0.1–1.0)
Monocytes Relative: 4 %
Neutro Abs: 5.4 10*3/uL (ref 1.7–7.7)
Neutrophils Relative %: 82 %
Platelets: 408 10*3/uL — ABNORMAL HIGH (ref 150–400)
RBC: 5.27 MIL/uL — ABNORMAL HIGH (ref 3.87–5.11)
RDW: 13.7 % (ref 11.5–15.5)
WBC: 6.5 10*3/uL (ref 4.0–10.5)
nRBC: 0 % (ref 0.0–0.2)

## 2019-08-05 LAB — D-DIMER, QUANTITATIVE: D-Dimer, Quant: 1.1 ug/mL-FEU — ABNORMAL HIGH (ref 0.00–0.50)

## 2019-08-05 LAB — ECHOCARDIOGRAM COMPLETE
Height: 65 in
Weight: 4857.17 oz

## 2019-08-05 LAB — LACTATE DEHYDROGENASE: LDH: 295 U/L — ABNORMAL HIGH (ref 98–192)

## 2019-08-05 LAB — C-REACTIVE PROTEIN: CRP: 0.7 mg/dL (ref <1.0)

## 2019-08-05 MED ORDER — METOPROLOL TARTRATE 25 MG PO TABS
25.0000 mg | ORAL_TABLET | Freq: Two times a day (BID) | ORAL | Status: DC
  Administered 2019-08-05 – 2019-08-07 (×5): 25 mg via ORAL
  Filled 2019-08-05 (×5): qty 1

## 2019-08-05 MED ORDER — INSULIN ASPART 100 UNIT/ML ~~LOC~~ SOLN
0.0000 [IU] | Freq: Every day | SUBCUTANEOUS | Status: DC
  Administered 2019-08-06: 2 [IU] via SUBCUTANEOUS
  Administered 2019-08-07: 3 [IU] via SUBCUTANEOUS
  Administered 2019-08-08 – 2019-08-11 (×4): 2 [IU] via SUBCUTANEOUS

## 2019-08-05 MED ORDER — INSULIN ASPART 100 UNIT/ML ~~LOC~~ SOLN
0.0000 [IU] | Freq: Three times a day (TID) | SUBCUTANEOUS | Status: DC
  Administered 2019-08-05 – 2019-08-06 (×2): 7 [IU] via SUBCUTANEOUS
  Administered 2019-08-06: 4 [IU] via SUBCUTANEOUS
  Administered 2019-08-06: 7 [IU] via SUBCUTANEOUS
  Administered 2019-08-07: 4 [IU] via SUBCUTANEOUS
  Administered 2019-08-07: 11 [IU] via SUBCUTANEOUS
  Administered 2019-08-07: 7 [IU] via SUBCUTANEOUS
  Administered 2019-08-08: 4 [IU] via SUBCUTANEOUS
  Administered 2019-08-08: 11 [IU] via SUBCUTANEOUS
  Administered 2019-08-08 – 2019-08-09 (×2): 7 [IU] via SUBCUTANEOUS
  Administered 2019-08-09: 11 [IU] via SUBCUTANEOUS
  Administered 2019-08-09: 7 [IU] via SUBCUTANEOUS
  Administered 2019-08-10: 4 [IU] via SUBCUTANEOUS
  Administered 2019-08-10: 7 [IU] via SUBCUTANEOUS
  Administered 2019-08-10 – 2019-08-11 (×2): 4 [IU] via SUBCUTANEOUS
  Administered 2019-08-11: 7 [IU] via SUBCUTANEOUS
  Administered 2019-08-11 – 2019-08-12 (×3): 4 [IU] via SUBCUTANEOUS
  Administered 2019-08-12: 7 [IU] via SUBCUTANEOUS
  Administered 2019-08-13 (×2): 4 [IU] via SUBCUTANEOUS

## 2019-08-05 NOTE — Progress Notes (Signed)
  Echocardiogram 2D Echocardiogram has been performed.  Christie Walker 08/05/2019, 1:47 PM

## 2019-08-05 NOTE — Progress Notes (Signed)
[facility]                                                                              Patient Demographics  Christie Walker, is a 49 y.o. female, DOB - 07/16/1970, MWU:[id number]  Admit date - 07/31/2019   Admitting Physician John Giovanni, MD  Outpatient Primary MD for the patient is Barbette Merino, NP  Outpatient specialists:   LOS - 5  days   Medical records reviewed and are as summarized below:    Chief Complaint  Patient presents with  . Shortness of Breath       Brief summary   Christie Walker is a 49 y.o. F with BMI 50, Bipolar in remission, DM and HTN who presented with 1 week fever, chills, cough, chest tightness/dyspnea and SOB with exertion.   In the ER, temp 100.61F, tachypneic, O2 sat 84% on room air.  CXR with bilateral opacities.  COVID+ on 3/16.   Assessment & Plan    Principal Problem:  Acute hypoxic respiratory failure due to acute COVID-19 viral pneumonia during the ongoing COVID-19 pandemic- POA - Patient presented with fevers, tachypnea, hypoxia.  Chest x-ray showed bilateral opacities, Covid positive.  Respiratory failure likely also worsened due to obesity hypoventilation. -Still hypoxic, on O2 14 L HFNC, CCM following  -Continue IV steroids, completed remdesivir on 3/28, Actemra on 3/25   -CT angiogram negative for PE however showed extensive groundglass opacities scattered throughout both lungs - CXR 3/28 showed bilateral airspace disease, more severe on the left -Negative balance of 540 cc, keep dry.  Received Lasix 40 mg IV x1.  Follow 2D echo - Continue Supportive care: vitamin C/zinc, albuterol, Tylenol. - Continue to wean oxygen, ambulatory O2 screening daily as tolerated  - Oxygen - SpO2: (!) 89 % O2 Flow Rate (L/min): 14 L/min - Continue to follow labs as below, inflammatory markers are improving  Lab Results  Component Value Date   SARSCOV2NAA POSITIVE (A) 07/23/2019     Recent Labs  Lab 07/31/19 1801  07/31/19 1801 08/01/19 0420 08/02/19 0420 08/03/19 0435 08/03/19 1617 08/04/19 0404 08/05/19 0426  DDIMER 0.80*   < > 0.88* 0.63* 0.52*  --  0.65* 1.10*  FERRITIN 318*  --   --   --   --   --   --   --   CRP 10.2*   < > 11.8* 7.9* 2.3*  --  1.1* 0.7  ALT 35   < > 40 48* 42  --  44 48*  PROCALCITON <0.10  --   --   --   --  <0.10  --   --    < > = values in this interval not displayed.    Active Problems:  Sinus tachycardia -Follow TSH, CT angiogram negative for PE - EKG shows sinus tachycardia, likely due to #1 -Placed on Lopressor 25 mg twice daily -Follow 2D echo    HTN (hypertension) -Currently stable, continue metoprolol  Metabolic syndrome, diabetes mellitus type 2, NIDDM -Hemoglobin A1c 6.1, on Metformin at home -CBGs elevated likely due to steroids, -Placed on sliding scale insulin  History of bipolar order  Continue lurasidone   Morbid obesity Estimated body mass index is 50.52 kg/m as calculated from the following:   Height as of this encounter: 5\' 5"  (1.651 m).   Weight as of this encounter: 137.7 kg.  Code Status: Full CODE STATUS DVT Prophylaxis:  Lovenox  Family Communication: Discussed all imaging results, lab results, explained to the patient Disposition Plan: Patient from home.  Anticipate discharge home once respiratory failure is improved, weaned O2.   Time Spent in minutes 35 minutes Procedures:  None  Consultants:   None  Antimicrobials:   Anti-infectives (From admission, onward)   Start     Dose/Rate Route Frequency Ordered Stop   08/04/19 1000  remdesivir 100 mg in sodium chloride 0.9 % 100 mL IVPB     100 mg 200 mL/hr over 30 Minutes Intravenous  Once 08/03/19 1511 08/04/19 1210   08/01/19 1000  remdesivir 100 mg in sodium chloride 0.9 % 100 mL IVPB  Status:  Discontinued     100 mg 200 mL/hr over 30 Minutes Intravenous Daily 07/31/19 2117 07/31/19 2123   08/01/19 1000  remdesivir 100 mg in sodium chloride 0.9 % 100 mL IVPB      100 mg 200 mL/hr over 30 Minutes Intravenous Daily 07/31/19 2123 08/03/19 1028   07/31/19 2200  remdesivir 100 mg in sodium chloride 0.9 % 100 mL IVPB     100 mg 200 mL/hr over 30 Minutes Intravenous Every hour 07/31/19 2123 07/31/19 2245   07/31/19 2130  remdesivir 200 mg in sodium chloride 0.9% 250 mL IVPB  Status:  Discontinued     200 mg 580 mL/hr over 30 Minutes Intravenous Once 07/31/19 2117 07/31/19 2123         Medications  Scheduled Meds: . vitamin C  500 mg Oral Daily  . enoxaparin (LOVENOX) injection  70 mg Subcutaneous Q24H  . furosemide  20 mg Oral Daily  . gabapentin  100 mg Oral BID  . lurasidone  40 mg Oral Daily  . methylPREDNISolone (SOLU-MEDROL) injection  40 mg Intravenous Q8H  . metoprolol tartrate  25 mg Oral BID  . zinc sulfate  220 mg Oral Daily   Continuous Infusions: PRN Meds:.acetaminophen, albuterol, benzonatate, guaiFENesin-dextromethorphan, loperamide      Subjective:   Christie Walker was seen and examined today.  Still quite short of breath at rest, on 14 L O2 HFNC.  Tachycardiac, no chest pain.  Patient denies dizziness, chest pain, abdominal pain, N/V/D/C, new weakness, numbess, tingling.  No fevers  Objective:   Vitals:   08/04/19 2040 08/04/19 2323 08/05/19 0437 08/05/19 1154  BP: 140/81 (!) 136/93 (!) 123/94 134/77  Pulse: (!) 114 (!) 109 (!) 109 (!) 123  Resp: 16 19 (!) 35 (!) 40  Temp: 98.4 F (36.9 C) 98.7 F (37.1 C) 98.5 F (36.9 C)   TempSrc: Oral Oral Oral   SpO2: 91% (!) 89% (!) 85% (!) 89%  Weight:      Height:        Intake/Output Summary (Last 24 hours) at 08/05/2019 1245 Last data filed at 08/04/2019 1900 Gross per 24 hour  Intake 240 ml  Output 1700 ml  Net -1460 ml     Wt Readings from Last 3 Encounters:  08/01/19 (!) 137.7 kg  06/14/19 (!) 142.4 kg  12/14/18 (!) 136.5 kg   Physical Exam  General: Alert and oriented x 3, NAD  Cardiovascular: S1 S2 clear, RRR. No pedal edema  b/l  Respiratory: Diminished breath sounds throughout  Gastrointestinal: Morbidly obese soft, nontender, nondistended, NBS  Ext: no pedal edema bilaterally  Neuro: no new deficits  Musculoskeletal: No cyanosis, clubbing  Skin: No rashes  Psych: Flat affect   Data Reviewed:  I have personally reviewed following labs and imaging studies  Micro Results Recent Results (from the past 240 hour(s))  Blood Culture (routine x 2)     Status: None (Preliminary result)   Collection Time: 07/31/19  6:48 PM   Specimen: BLOOD  Result Value Ref Range Status   Specimen Description BLOOD RIGHT ANTECUBITAL  Final   Special Requests   Final    BOTTLES DRAWN AEROBIC AND ANAEROBIC Blood Culture adequate volume Performed at [facility] [address], [place], [place] [address]    Culture NO GROWTH 4 DAYS  Final   Report Status PENDING  Incomplete  Blood Culture (routine x 2)     Status: None (Preliminary result)   Collection Time: 08/01/19  4:20 AM   Specimen: BLOOD RIGHT HAND  Result Value Ref Range Status   Specimen Description BLOOD RIGHT HAND  Final   Special Requests   Final    BOTTLES DRAWN AEROBIC AND ANAEROBIC Blood Culture adequate volume Performed at [facility] [address] [place], [place] [address]    Culture NO GROWTH 4 DAYS  Final   Report Status PENDING  Incomplete    Radiology Reports CT ANGIO CHEST PE W OR WO CONTRAST  Result Date: 08/03/2019 CLINICAL DATA:  Shortness of breath, COVID, hypoxia EXAM: CT ANGIOGRAPHY CHEST WITH CONTRAST TECHNIQUE: Multidetector CT imaging of the chest was performed using the standard protocol during bolus administration of intravenous contrast. Multiplanar CT image reconstructions and MIPs were obtained to evaluate the vascular anatomy. CONTRAST:  165mL OMNIPAQUE IOHEXOL 350 MG/ML SOLN COMPARISON:  None. FINDINGS: Cardiovascular: Heart size normal. No pericardial effusion. Satisfactory  opacification of pulmonary arteries noted, and there is no evidence of pulmonary emboli. Adequate contrast opacification of the thoracic aorta with no evidence of dissection, aneurysm, or stenosis. There is classic 3-vessel brachiocephalic arch anatomy without proximal stenosis. No significant atheromatous change. Mediastinum/Nodes: No significant hilar or mediastinal adenopathy. Lungs/Pleura: No pleural effusion. No pneumothorax. Extensive ground-glass opacities scattered throughout both lungs, involving bases more than apices. Airspace consolidation posteriorly in both lower lobes with peripheral air bronchograms. Upper Abdomen: No acute findings. Musculoskeletal: No chest wall abnormality. No acute or significant osseous findings. Review of the MIP images confirms the above findings. IMPRESSION: 1. Negative for acute PE or thoracic aortic dissection. 2. Extensive ground-glass opacities scattered throughout both lungs, with consolidation posteriorly in both lower lobes, consistent with atypical/viral pneumonia. Electronically Signed   By: Lucrezia Europe M.D.   On: 08/03/2019 14:22   DG Chest Port 1 View  Result Date: 08/04/2019 CLINICAL DATA:  Respiratory failure and tachycardia. COVID pneumonia. EXAM: PORTABLE CHEST 1 VIEW COMPARISON:  CTA of the chest on 08/03/2019 FINDINGS: The heart size and mediastinal contours are within normal limits. Bilateral airspace disease again noted which is more severe in the left lung by chest x-ray compared to the right. No significant pleural fluid. No pneumothorax. Visualized skeletal structures are unremarkable. IMPRESSION: Stable bilateral airspace disease, more severe on the left. Electronically Signed   By: Aletta Edouard M.D.   On: 08/04/2019 12:43   DG Chest Port 1 View  Result Date: 07/31/2019 CLINICAL DATA:  49 y.o female covid positive 3/16. Pt c/o SOB and cough. Hx HTN. Pt unable to take a deep inspiration  for imaging. EXAM: PORTABLE CHEST 1 VIEW COMPARISON:   11/03/2016 FINDINGS: Left greater than right hazy airspace lung opacities in the mid to lower lungs predominantly, new since the prior exam, consistent with multifocal pneumonia. No convincing pleural effusion and no pneumothorax. Cardiac silhouette is normal in size. No mediastinal or hilar masses. Skeletal structures are grossly intact. IMPRESSION: 1. Hazy, left greater than right, bilateral airspace lung opacities consistent with multifocal pneumonia and in a pattern consistent COVID-19 pneumonia. Electronically Signed   By: Amie Portland M.D.   On: 07/31/2019 17:52    Lab Data:  CBC: Recent Labs  Lab 08/01/19 0420 08/02/19 0420 08/03/19 0435 08/04/19 0404 08/05/19 0426  WBC 4.6 4.4 4.6 5.7 6.5  NEUTROABS 4.0 3.5 3.6 4.1 5.4  HGB 13.1 13.0 13.6 14.3 15.4*  HCT 41.2 41.0 42.9 45.2 48.3*  MCV 92.6 93.4 93.3 93.2 91.7  PLT 192 230 279 345 408*   Basic Metabolic Panel: Recent Labs  Lab 08/01/19 0420 08/02/19 0420 08/03/19 0435 08/04/19 0404 08/05/19 0426  NA 139 141 143 146* 143  K 3.5 3.7 3.9 4.0 3.5  CL 105 106 105 106 103  CO2 23 25 25 28 28   GLUCOSE 151* 161* 167* 157* 184*  BUN 9 13 16 14 17   CREATININE 0.66 0.70 0.61 0.82 0.83  CALCIUM 8.4* 8.6* 8.8* 9.2 9.2   GFR: Estimated Creatinine Clearance: 118.1 mL/min (by C-G formula based on SCr of 0.83 mg/dL). Liver Function Tests: Recent Labs  Lab 08/01/19 0420 08/02/19 0420 08/03/19 0435 08/04/19 0404 08/05/19 0426  AST 62* 59* 41 39 34  ALT 40 48* 42 44 48*  ALKPHOS 99 91 84 86 85  BILITOT 0.5 0.4 0.4 0.5 0.4  PROT 7.1 6.9 7.0 7.4 7.4  ALBUMIN 3.1* 2.9* 3.0* 3.2* 3.3*   No results for input(s): LIPASE, AMYLASE in the last 168 hours. No results for input(s): AMMONIA in the last 168 hours. Coagulation Profile: No results for input(s): INR, PROTIME in the last 168 hours. Cardiac Enzymes: No results for input(s): CKTOTAL, CKMB, CKMBINDEX, TROPONINI in the last 168 hours. BNP (last 3 results) No results for  input(s): PROBNP in the last 8760 hours. HbA1C: No results for input(s): HGBA1C in the last 72 hours. CBG: Recent Labs  Lab 08/03/19 1726 08/04/19 0747 08/04/19 1621 08/05/19 0656 08/05/19 0803  GLUCAP 134* 156* 204* 223* 179*   Lipid Profile: No results for input(s): CHOL, HDL, LDLCALC, TRIG, CHOLHDL, LDLDIRECT in the last 72 hours. Thyroid Function Tests: No results for input(s): TSH, T4TOTAL, FREET4, T3FREE, THYROIDAB in the last 72 hours. Anemia Panel: No results for input(s): VITAMINB12, FOLATE, FERRITIN, TIBC, IRON, RETICCTPCT in the last 72 hours. Urine analysis:    Component Value Date/Time   COLORURINE YELLOW 08/26/2011 0251   APPEARANCEUR CLOUDY (A) 08/26/2011 0251   LABSPEC 1.025 01/26/2017 1508   PHURINE 6.0 01/26/2017 1508   GLUCOSEU NEGATIVE 01/26/2017 1508   HGBUR NEGATIVE 01/26/2017 1508   BILIRUBINUR neg 06/14/2019 1512   KETONESUR NEGATIVE 01/26/2017 1508   PROTEINUR Negative 06/14/2019 1512   PROTEINUR NEGATIVE 01/26/2017 1508   UROBILINOGEN 0.2 06/14/2019 1512   UROBILINOGEN 0.2 01/26/2017 1508   NITRITE neg 06/14/2019 1512   NITRITE NEGATIVE 01/26/2017 1508   LEUKOCYTESUR Negative 06/14/2019 1512     Lamekia Nolden M.D. [facility] 08/05/2019, 12:45 PM   Call night coverage person covering after 7pm

## 2019-08-06 DIAGNOSIS — R6510 Systemic inflammatory response syndrome (SIRS) of non-infectious origin without acute organ dysfunction: Secondary | ICD-10-CM

## 2019-08-06 LAB — D-DIMER, QUANTITATIVE: D-Dimer, Quant: 0.64 ug/mL-FEU — ABNORMAL HIGH (ref 0.00–0.50)

## 2019-08-06 LAB — CULTURE, BLOOD (ROUTINE X 2)
Culture: NO GROWTH
Culture: NO GROWTH
Special Requests: ADEQUATE
Special Requests: ADEQUATE

## 2019-08-06 LAB — CBC
HCT: 49.5 % — ABNORMAL HIGH (ref 36.0–46.0)
Hemoglobin: 15.9 g/dL — ABNORMAL HIGH (ref 12.0–15.0)
MCH: 29.5 pg (ref 26.0–34.0)
MCHC: 32.1 g/dL (ref 30.0–36.0)
MCV: 91.8 fL (ref 80.0–100.0)
Platelets: 457 10*3/uL — ABNORMAL HIGH (ref 150–400)
RBC: 5.39 MIL/uL — ABNORMAL HIGH (ref 3.87–5.11)
RDW: 13.7 % (ref 11.5–15.5)
WBC: 12.1 10*3/uL — ABNORMAL HIGH (ref 4.0–10.5)
nRBC: 0 % (ref 0.0–0.2)

## 2019-08-06 LAB — GLUCOSE, CAPILLARY
Glucose-Capillary: 163 mg/dL — ABNORMAL HIGH (ref 70–99)
Glucose-Capillary: 213 mg/dL — ABNORMAL HIGH (ref 70–99)
Glucose-Capillary: 247 mg/dL — ABNORMAL HIGH (ref 70–99)
Glucose-Capillary: 216 mg/dL — ABNORMAL HIGH (ref 70–99)

## 2019-08-06 LAB — C-REACTIVE PROTEIN: CRP: 0.6 mg/dL (ref <1.0)

## 2019-08-06 LAB — BASIC METABOLIC PANEL
Anion gap: 14 (ref 5–15)
BUN: 17 mg/dL (ref 6–20)
CO2: 25 mmol/L (ref 22–32)
Calcium: 9.4 mg/dL (ref 8.9–10.3)
Chloride: 105 mmol/L (ref 98–111)
Creatinine, Ser: 0.72 mg/dL (ref 0.44–1.00)
GFR calc Af Amer: >60 mL/min (ref >60)
GFR calc non Af Amer: >60 mL/min (ref >60)
Glucose, Bld: 190 mg/dL — ABNORMAL HIGH (ref 70–99)
Potassium: 4 mmol/L (ref 3.5–5.1)
Sodium: 144 mmol/L (ref 135–145)

## 2019-08-06 LAB — T4, FREE: Free T4: 1.36 ng/dL — ABNORMAL HIGH (ref 0.61–1.12)

## 2019-08-06 LAB — HEMOGLOBIN A1C
Hgb A1c MFr Bld: 6.5 % — ABNORMAL HIGH (ref 4.8–5.6)
Mean Plasma Glucose: 139.85 mg/dL

## 2019-08-06 LAB — TSH: TSH: 0.262 u[IU]/mL — ABNORMAL LOW (ref 0.350–4.500)

## 2019-08-06 MED ORDER — INSULIN GLARGINE 100 UNIT/ML ~~LOC~~ SOLN
10.0000 [IU] | Freq: Every day | SUBCUTANEOUS | Status: DC
  Administered 2019-08-06: 10 [IU] via SUBCUTANEOUS
  Filled 2019-08-06 (×2): qty 0.1

## 2019-08-06 MED ORDER — INSULIN ASPART 100 UNIT/ML ~~LOC~~ SOLN
3.0000 [IU] | Freq: Three times a day (TID) | SUBCUTANEOUS | Status: DC
  Administered 2019-08-06 – 2019-08-07 (×4): 3 [IU] via SUBCUTANEOUS

## 2019-08-06 MED ORDER — FUROSEMIDE 10 MG/ML IJ SOLN
40.0000 mg | Freq: Every day | INTRAMUSCULAR | Status: AC
  Administered 2019-08-06 – 2019-08-07 (×2): 40 mg via INTRAVENOUS
  Filled 2019-08-06 (×2): qty 4

## 2019-08-06 MED ORDER — FLUTICASONE PROPIONATE 50 MCG/ACT NA SUSP
2.0000 | Freq: Every day | NASAL | Status: DC
  Administered 2019-08-07 – 2019-08-13 (×7): 2 via NASAL
  Filled 2019-08-06: qty 16

## 2019-08-06 NOTE — Progress Notes (Signed)
[facility]                                                                              Patient Demographics  Christie Walker, is a 49 y.o. female, DOB - [birth year], FBX:[id number]  Admit date - 07/31/2019   Admitting Physician John Giovanni, MD  Outpatient Primary MD for the patient is Barbette Merino, NP  Outpatient specialists:   LOS - 6  days   Medical records reviewed and are as summarized below:    Chief Complaint  Patient presents with  . Shortness of Breath       Brief summary   [name] is a 49 y.o. F with BMI 50, Bipolar in remission, DM and HTN who presented with 1 week fever, chills, cough, chest tightness/dyspnea and SOB with exertion.   In the ER, temp 100.85F, tachypneic, O2 sat 84% on room air.  CXR with bilateral opacities.  COVID+ on 3/16.   Assessment & Plan    Principal Problem:  Acute hypoxic respiratory failure due to acute COVID-19 viral pneumonia during the ongoing COVID-19 pandemic- POA - Patient presented with fevers, tachypnea, hypoxia.  Chest x-ray showed bilateral opacities, Covid positive.  Respiratory failure likely also worsened due to obesity hypoventilation. -Persistently hypoxic, on O2 14 L.  She has completed IV steroids, remdesivir on 3/28, Actemra on 3/25.  -CT angiogram negative for PE however showed extensive groundglass opacities scattered throughout both lungs - CXR 3/28 showed bilateral airspace disease, more severe on the left -Continue negative balance, 920 cc, negative balance of 540 cc.  2D echo 3/29 showed EF of 65 to 70% with moderate LVH, indeterminate diastolic parameters.  Weight down from 315 on admission to 290lbs today, will give lasix 40 mg IV daily x2, reassess in a.m. -Recommended proning, incentive spirometry. CCM evaluated patient on 3/28, no new recommendations at this time - Continue Supportive care: vitamin C/zinc, albuterol, Tylenol. - Continue to wean oxygen, ambulatory O2  screening daily as tolerated  - Oxygen - SpO2: (!) 89 % O2 Flow Rate (L/min): 14 L/min - Continue to follow labs as below, inflammatory markers are improving  Lab Results  Component Value Date   SARSCOV2NAA POSITIVE (A) 07/23/2019     Recent Labs  Lab 07/31/19 1801 07/31/19 1801 08/01/19 0420 08/01/19 0420 08/02/19 0420 08/03/19 0435 08/03/19 1617 08/04/19 0404 08/05/19 0426 08/06/19 0442  DDIMER 0.80*   < > 0.88*   < > 0.63* 0.52*  --  0.65* 1.10* 0.64*  FERRITIN 318*  --   --   --   --   --   --   --   --   --   CRP 10.2*   < > 11.8*   < > 7.9* 2.3*  --  1.1* 0.7 0.6  ALT 35   < > 40  --  48* 42  --  44 48*  --   PROCALCITON <0.10  --   --   --   --   --  <0.10  --   --   --    < > = values in this interval not displayed.  Active Problems:  Sinus tachycardia - EKG shows sinus tachycardia, likely due to #1 -Placed on Lopressor 25 mg twice daily -TSH low 0.262, obtain free T4, T3, may need methimazole if hyperthyroid    HTN (hypertension) -Continue metoprolol, placed on IV Lasix today  Metabolic syndrome, diabetes mellitus type 2, NIDDM -Hemoglobin A1c 6.1, on Metformin at home -CBGs uncontrolled likely due to steroids Hemoglobin A1c 6.5, Continue sliding scale insulin, add basal insulin, NovoLog meal coverage 3 units 3 times daily AC  History of bipolar order Continue lurasidone   Morbid obesity Estimated body mass index is 48.28 kg/m as calculated from the following:   Height as of this encounter: 5\' 5"  (1.651 m).   Weight as of this encounter: 131.6 kg.  Code Status: Full CODE STATUS DVT Prophylaxis:  Lovenox  Family Communication: Discussed all imaging results, lab results, explained to the patient Disposition Plan: Patient from home.  Anticipate discharge home once respiratory failure is improved, weaned O2.   Time Spent in minutes 35 minutes Procedures:  None  Consultants:   Pulmonary critical care  Antimicrobials:   Anti-infectives (From  admission, onward)   Start     Dose/Rate Route Frequency Ordered Stop   08/04/19 1000  remdesivir 100 mg in sodium chloride 0.9 % 100 mL IVPB     100 mg 200 mL/hr over 30 Minutes Intravenous  Once 08/03/19 1511 08/04/19 1210   08/01/19 1000  remdesivir 100 mg in sodium chloride 0.9 % 100 mL IVPB  Status:  Discontinued     100 mg 200 mL/hr over 30 Minutes Intravenous Daily 07/31/19 2117 07/31/19 2123   08/01/19 1000  remdesivir 100 mg in sodium chloride 0.9 % 100 mL IVPB     100 mg 200 mL/hr over 30 Minutes Intravenous Daily 07/31/19 2123 08/03/19 1028   07/31/19 2200  remdesivir 100 mg in sodium chloride 0.9 % 100 mL IVPB     100 mg 200 mL/hr over 30 Minutes Intravenous Every hour 07/31/19 2123 07/31/19 2245   07/31/19 2130  remdesivir 200 mg in sodium chloride 0.9% 250 mL IVPB  Status:  Discontinued     200 mg 580 mL/hr over 30 Minutes Intravenous Once 07/31/19 2117 07/31/19 2123         Medications  Scheduled Meds: . vitamin C  500 mg Oral Daily  . enoxaparin (LOVENOX) injection  70 mg Subcutaneous Q24H  . furosemide  20 mg Oral Daily  . gabapentin  100 mg Oral BID  . insulin aspart  0-20 Units Subcutaneous TID WC  . insulin aspart  0-5 Units Subcutaneous QHS  . lurasidone  40 mg Oral Daily  . methylPREDNISolone (SOLU-MEDROL) injection  40 mg Intravenous Q8H  . metoprolol tartrate  25 mg Oral BID  . zinc sulfate  220 mg Oral Daily   Continuous Infusions: PRN Meds:.acetaminophen, albuterol, benzonatate, guaiFENesin-dextromethorphan, loperamide      Subjective:   Christie Walker was seen and examined today.  Still quite short of breath at rest, still on 14 L O2.  No chest pain, dizziness or lightheadedness.  Shortness of breath with minimal exertion.  No chest pain.  No fevers.  Objective:   Vitals:   08/06/19 0500 08/06/19 0604 08/06/19 0925 08/06/19 1215  BP:  111/80 115/82 (!) 153/86  Pulse:  (!) 116 (!) 132 (!) 114  Resp:  (!) 37 (!) 30 (!) 34  Temp:   98.1 F (36.7 C) 98.4 F (36.9 C) 98.3 F (36.8 C)  TempSrc:  Oral Oral Oral  SpO2:  (!) 86% (!) 87% (!) 89%  Weight: 131.6 kg     Height:        Intake/Output Summary (Last 24 hours) at 08/06/2019 1451 Last data filed at 08/06/2019 0600 Gross per 24 hour  Intake --  Output 400 ml  Net -400 ml     Wt Readings from Last 3 Encounters:  08/06/19 131.6 kg  06/14/19 (!) 142.4 kg  12/14/18 (!) 136.5 kg    Physical Exam  General: Alert and oriented x 3, NAD  Cardiovascular: S1-S2 clear, RRR, tachycardia  Respiratory: Diminished breath sounds throughout  Gastrointestinal: Morbidly obese, soft, nontender, nondistended, NBS  Ext: no pedal edema bilaterally  Neuro: no new deficits  Musculoskeletal: No cyanosis, clubbing  Skin: No rashes  Psych: Normal affect and demeanor, alert and oriented x3    Data Reviewed:  I have personally reviewed following labs and imaging studies  Micro Results Recent Results (from the past 240 hour(s))  Blood Culture (routine x 2)     Status: None   Collection Time: 07/31/19  6:48 PM   Specimen: BLOOD  Result Value Ref Range Status   Specimen Description   Final    BLOOD RIGHT ANTECUBITAL Performed at [facility], [address], [place], [place] [address]    Special Requests   Final    BOTTLES DRAWN AEROBIC AND ANAEROBIC Blood Culture adequate volume Performed at [facility], [address], [place], [place] [address]    Culture   Final    NO GROWTH 5 DAYS Performed at [facility], [address], [place], [place] [address]    Report Status 08/06/2019 FINAL  Final  Blood Culture (routine x 2)     Status: None   Collection Time: 08/01/19  4:20 AM   Specimen: BLOOD RIGHT HAND  Result Value Ref Range Status   Specimen Description   Final    BLOOD RIGHT HAND Performed at [facility], [address], [place], [place] [address]    Special Requests   Final    BOTTLES  DRAWN AEROBIC AND ANAEROBIC Blood Culture adequate volume Performed at [facility], [address] [place], [place] [address]    Culture   Final    NO GROWTH 5 DAYS Performed at [facility], [address] [place], [place] [address]    Report Status 08/06/2019 FINAL  Final    Radiology Reports CT ANGIO CHEST PE W OR WO CONTRAST  Result Date: 08/03/2019 CLINICAL DATA:  Shortness of breath, COVID, hypoxia EXAM: CT ANGIOGRAPHY CHEST WITH CONTRAST TECHNIQUE: Multidetector CT imaging of the chest was performed using the standard protocol during bolus administration of intravenous contrast. Multiplanar CT image reconstructions and MIPs were obtained to evaluate the vascular anatomy. CONTRAST:  OMNIPAQUE IOHEXOL 350 MG/ML SOLN COMPARISON:  None. FINDINGS: Cardiovascular: Heart size normal. No pericardial effusion. Satisfactory opacification of pulmonary arteries noted, and there is no evidence of pulmonary emboli. Adequate contrast opacification of the thoracic aorta with no evidence of dissection, aneurysm, or stenosis. There is classic 3-vessel brachiocephalic arch anatomy without proximal stenosis. No significant atheromatous change. Mediastinum/Nodes: No significant hilar or mediastinal adenopathy. Lungs/Pleura: No pleural effusion. No pneumothorax. Extensive ground-glass opacities scattered throughout both lungs, involving bases more than apices. Airspace consolidation posteriorly in both lower lobes with peripheral air bronchograms. Upper Abdomen: No acute findings. Musculoskeletal: No chest wall abnormality. No acute or significant osseous findings. Review  of the MIP images confirms the above findings. IMPRESSION: 1. Negative for acute PE or thoracic aortic dissection. 2. Extensive ground-glass opacities scattered throughout both lungs, with consolidation posteriorly in both lower lobes, consistent with atypical/viral pneumonia. Electronically Signed   By: Corlis Leak  M.D.   On: 08/03/2019 14:22   DG Chest Port 1 View  Result Date: 08/04/2019 CLINICAL DATA:  Respiratory failure and tachycardia. COVID pneumonia. EXAM: PORTABLE CHEST 1 VIEW COMPARISON:  CTA of the chest on 08/03/2019 FINDINGS: The heart size and mediastinal contours are within normal limits. Bilateral airspace disease again noted which is more severe in the left lung by chest x-ray compared to the right. No significant pleural fluid. No pneumothorax. Visualized skeletal structures are unremarkable. IMPRESSION: Stable bilateral airspace disease, more severe on the left. Electronically Signed   By: Irish Lack M.D.   On: 08/04/2019 12:43   DG Chest Port 1 View  Result Date: 07/31/2019 CLINICAL DATA:  49 y.o female covid positive 3/16. Pt c/o SOB and cough. Hx HTN. Pt unable to take a deep inspiration for imaging. EXAM: PORTABLE CHEST 1 VIEW COMPARISON:  11/03/2016 FINDINGS: Left greater than right hazy airspace lung opacities in the mid to lower lungs predominantly, new since the prior exam, consistent with multifocal pneumonia. No convincing pleural effusion and no pneumothorax. Cardiac silhouette is normal in size. No mediastinal or hilar masses. Skeletal structures are grossly intact. IMPRESSION: 1. Hazy, left greater than right, bilateral airspace lung opacities consistent with multifocal pneumonia and in a pattern consistent COVID-19 pneumonia. Electronically Signed   By: Amie Portland M.D.   On: 07/31/2019 17:52   ECHOCARDIOGRAM COMPLETE  Result Date: 08/05/2019    ECHOCARDIOGRAM REPORT   Patient Name:   Christie Walker Date of Exam: 08/05/2019 Medical Rec #:  [id number]     Height:       65.0 in Accession #:    [id number]    Weight:       303.6 lb Date of Birth:  Dec 02, 1970    BSA:          2.362 m Patient Age:    48 years      BP:           134/77 mmHg Patient Gender: F             HR:           112 bpm. Exam Location:  Inpatient Procedure: 2D Echo, Cardiac Doppler and Color Doppler Indications:     R06.02 SOB  History:        Patient has prior history of Echocardiogram examinations, most                 recent 09/23/2016. Signs/Symptoms:Dyspnea and Chest Pain; Risk                 Factors:Hypertension. Edema. Covid 19 positive. Pneumonia.  Sonographer:    Sheralyn Boatman RDCS Referring Phys: [id number] Clearnce Leja K Johannah Rozas  Sonographer Comments: Technically difficult study due to poor echo windows, no subcostal window and patient is morbidly obese. Image acquisition challenging due to patient body habitus. IMPRESSIONS  1. Technically difficult study. Left ventricular ejection fraction, by estimation, is 65 to 70%. The left ventricle has grossly normal function. There is moderate left ventricular hypertrophy. Left ventricular diastolic parameters are indeterminate.  2. Right ventricular systolic function is normal. The right ventricular size is normal.  3. The mitral valve is normal in structure. No evidence of mitral valve regurgitation.  4. The aortic valve was not well visualized. Aortic valve regurgitation is not visualized. No aortic stenosis is present. FINDINGS  Left Ventricle: Left ventricular ejection fraction, by estimation, is 65 to 70%. The left ventricle has normal function. The left ventricle has no regional wall motion abnormalities. The left ventricular internal cavity size was small. There is moderate  left ventricular hypertrophy. Left ventricular diastolic parameters are indeterminate. Right Ventricle: The right ventricular size is normal. Right vetricular wall thickness was not assessed. Right ventricular systolic function is normal. Left Atrium: Left atrial size was normal in size. Right Atrium: Right atrial size was normal in size. Pericardium: Trivial pericardial effusion is present. Presence of pericardial fat pad. Mitral Valve: The mitral valve is normal in structure. No evidence of mitral valve regurgitation. Tricuspid Valve: The tricuspid valve is not well visualized. Tricuspid valve regurgitation is  trivial. Aortic Valve: The aortic valve was not well visualized. Aortic valve regurgitation is not visualized. No aortic stenosis is present. Pulmonic Valve: The pulmonic valve was not well visualized. Pulmonic valve regurgitation is not visualized. Aorta: The aortic root and ascending aorta are structurally normal, with no evidence of dilitation. IAS/Shunts: The interatrial septum was not well visualized.  LEFT VENTRICLE PLAX 2D LVIDd:         2.93 cm     Diastology LVIDs:         2.01 cm     LV e' lateral:   9.36 cm/s LV PW:         1.19 cm     LV E/e' lateral: 6.0 LV IVS:        1.39 cm     LV e' medial:    5.22 cm/s LVOT diam:     2.10 cm     LV E/e' medial:  10.7 LV SV:         47 LV SV Index:   20 LVOT Area:     3.46 cm  LV Volumes (MOD) LV vol d, MOD A2C: 33.2 ml LV vol d, MOD A4C: 49.0 ml LV vol s, MOD A2C: 12.5 ml LV vol s, MOD A4C: 17.4 ml LV SV MOD A2C:     20.7 ml LV SV MOD A4C:     49.0 ml LV SV MOD BP:      26.2 ml RIGHT VENTRICLE             IVC RV S prime:     14.40 cm/s  IVC diam: 1.93 cm TAPSE (M-mode): 1.5 cm LEFT ATRIUM             Index       RIGHT ATRIUM          Index LA diam:        2.40 cm 1.02 cm/m  RA Area:     6.50 cm LA Vol (A2C):   23.9 ml 10.12 ml/m RA Volume:   9.07 ml  3.84 ml/m LA Vol (A4C):   18.7 ml 7.92 ml/m LA Biplane Vol: 21.7 ml 9.19 ml/m  AORTIC VALVE LVOT Vmax:   103.00 cm/s LVOT Vmean:  71.300 cm/s LVOT VTI:    0.135 m  AORTA Ao Root diam: 2.90 cm Ao Asc diam:  3.00 cm MITRAL VALVE MV Area (PHT): 4.80 cm    SHUNTS MV Decel Time: 158 msec    Systemic VTI:  0.14 m MV E velocity: 55.80 cm/s  Systemic Diam: 2.10 cm MV A velocity: 66.90 cm/s MV E/A ratio:  0.83 Oswaldo Milian  MD Electronically signed by Epifanio Lescheshristopher Schumann MD Signature Date/Time: 08/05/2019/7:07:30 PM    Final     Lab Data:  CBC: Recent Labs  Lab 08/01/19 0420 08/01/19 0420 08/02/19 0420 08/03/19 0435 08/04/19 0404 08/05/19 0426 08/06/19 0442  WBC 4.6   < > 4.4 4.6 5.7 6.5 12.1*    NEUTROABS 4.0  --  3.5 3.6 4.1 5.4  --   HGB 13.1   < > 13.0 13.6 14.3 15.4* 15.9*  HCT 41.2   < > 41.0 42.9 45.2 48.3* 49.5*  MCV 92.6   < > 93.4 93.3 93.2 91.7 91.8  PLT 192   < > 230 279 345 408* 457*   < > = values in this interval not displayed.   Basic Metabolic Panel: Recent Labs  Lab 08/02/19 0420 08/03/19 0435 08/04/19 0404 08/05/19 0426 08/06/19 0442  NA 141 143 146* 143 144  K 3.7 3.9 4.0 3.5 4.0  CL 106 105 106 103 105  CO2 25 25 28 28 25   GLUCOSE 161* 167* 157* 184* 190*  BUN 13 16 14 17 17   CREATININE 0.70 0.61 0.82 0.83 0.72  CALCIUM 8.6* 8.8* 9.2 9.2 9.4   GFR: Estimated Creatinine Clearance: 119.1 mL/min (by C-G formula based on SCr of 0.72 mg/dL). Liver Function Tests: Recent Labs  Lab 08/01/19 0420 08/02/19 0420 08/03/19 0435 08/04/19 0404 08/05/19 0426  AST 62* 59* 41 39 34  ALT 40 48* 42 44 48*  ALKPHOS 99 91 84 86 85  BILITOT 0.5 0.4 0.4 0.5 0.4  PROT 7.1 6.9 7.0 7.4 7.4  ALBUMIN 3.1* 2.9* 3.0* 3.2* 3.3*   No results for input(s): LIPASE, AMYLASE in the last 168 hours. No results for input(s): AMMONIA in the last 168 hours. Coagulation Profile: No results for input(s): INR, PROTIME in the last 168 hours. Cardiac Enzymes: No results for input(s): CKTOTAL, CKMB, CKMBINDEX, TROPONINI in the last 168 hours. BNP (last 3 results) No results for input(s): PROBNP in the last 8760 hours. HbA1C: Recent Labs    08/06/19 0442  HGBA1C 6.5*   CBG: Recent Labs  Lab 08/05/19 0803 08/05/19 1708 08/05/19 2154 08/06/19 0806 08/06/19 1211  GLUCAP 179* 215* 187* 163* 213*   Lipid Profile: No results for input(s): CHOL, HDL, LDLCALC, TRIG, CHOLHDL, LDLDIRECT in the last 72 hours. Thyroid Function Tests: Recent Labs    08/06/19 0442  TSH 0.262*   Anemia Panel: No results for input(s): VITAMINB12, FOLATE, FERRITIN, TIBC, IRON, RETICCTPCT in the last 72 hours. Urine analysis:    Component Value Date/Time   COLORURINE YELLOW 08/26/2011 0251    APPEARANCEUR CLOUDY (A) 08/26/2011 0251   LABSPEC 1.025 01/26/2017 1508   PHURINE 6.0 01/26/2017 1508   GLUCOSEU NEGATIVE 01/26/2017 1508   HGBUR NEGATIVE 01/26/2017 1508   BILIRUBINUR neg 06/14/2019 1512   KETONESUR NEGATIVE 01/26/2017 1508   PROTEINUR Negative 06/14/2019 1512   PROTEINUR NEGATIVE 01/26/2017 1508   UROBILINOGEN 0.2 06/14/2019 1512   UROBILINOGEN 0.2 01/26/2017 1508   NITRITE neg 06/14/2019 1512   NITRITE NEGATIVE 01/26/2017 1508   LEUKOCYTESUR Negative 06/14/2019 1512     Samera Macy M.D. [facility] 08/06/2019, 2:51 PM   Call night coverage person covering after 7pm

## 2019-08-06 NOTE — Plan of Care (Signed)
Talked with mom and family at home to discuss update of POC, patient current status and more of what to expect when patient is discharged. Informed to encourage patient to prone, continue breathing exercises, use O2 if provided and NOT remain sedentary.   Also discussed that patient is currently on border of remaining on med surg or needing to be transferred to higher level of care.  I stated "we would continue to update if patient's situation changes." No further questions at this time.

## 2019-08-06 NOTE — Plan of Care (Signed)
Patient mews continues to be in red.  Unfortunately, this has become baseline for now and Dr. Is aware.  O2 needs are still requiring 14L HFNC  - normally in upper 80s.  Y splitter in room in case O2 needs increase and NRB is also needed.  Patient instructed in proper use of incentive spirometer and asked to use 8-10x/hr.  Also educated on importance of pronning and asked to attempt at least half the day.  Patient reports she normally sleeps on her belly at night, but has not been doing so here (since her bed has not been in the correct position).  3rd shift will be informed so they can assist when able.   Patient assisted to prone position before leaving the room.

## 2019-08-07 DIAGNOSIS — F319 Bipolar disorder, unspecified: Secondary | ICD-10-CM

## 2019-08-07 DIAGNOSIS — E119 Type 2 diabetes mellitus without complications: Secondary | ICD-10-CM

## 2019-08-07 DIAGNOSIS — E66813 Obesity, class 3: Secondary | ICD-10-CM

## 2019-08-07 DIAGNOSIS — F3181 Bipolar II disorder: Secondary | ICD-10-CM

## 2019-08-07 LAB — GLUCOSE, CAPILLARY
Glucose-Capillary: 183 mg/dL — ABNORMAL HIGH (ref 70–99)
Glucose-Capillary: 275 mg/dL — ABNORMAL HIGH (ref 70–99)
Glucose-Capillary: 220 mg/dL — ABNORMAL HIGH (ref 70–99)
Glucose-Capillary: 274 mg/dL — ABNORMAL HIGH (ref 70–99)

## 2019-08-07 LAB — T3: T3, Total: 82 ng/dL (ref 71–180)

## 2019-08-07 LAB — CBC
HCT: 51.7 % — ABNORMAL HIGH (ref 36.0–46.0)
Hemoglobin: 17 g/dL — ABNORMAL HIGH (ref 12.0–15.0)
MCH: 29.6 pg (ref 26.0–34.0)
MCHC: 32.9 g/dL (ref 30.0–36.0)
MCV: 90.1 fL (ref 80.0–100.0)
Platelets: 485 10*3/uL — ABNORMAL HIGH (ref 150–400)
RBC: 5.74 MIL/uL — ABNORMAL HIGH (ref 3.87–5.11)
RDW: 13.5 % (ref 11.5–15.5)
WBC: 15.1 10*3/uL — ABNORMAL HIGH (ref 4.0–10.5)
nRBC: 0 % (ref 0.0–0.2)

## 2019-08-07 LAB — BASIC METABOLIC PANEL
Anion gap: 10 (ref 5–15)
BUN: 21 mg/dL — ABNORMAL HIGH (ref 6–20)
CO2: 27 mmol/L (ref 22–32)
Calcium: 8.9 mg/dL (ref 8.9–10.3)
Chloride: 103 mmol/L (ref 98–111)
Creatinine, Ser: 0.81 mg/dL (ref 0.44–1.00)
GFR calc Af Amer: >60 mL/min (ref >60)
GFR calc non Af Amer: >60 mL/min (ref >60)
Glucose, Bld: 184 mg/dL — ABNORMAL HIGH (ref 70–99)
Potassium: 3.8 mmol/L (ref 3.5–5.1)
Sodium: 140 mmol/L (ref 135–145)

## 2019-08-07 LAB — D-DIMER, QUANTITATIVE: D-Dimer, Quant: 0.59 ug/mL-FEU — ABNORMAL HIGH (ref 0.00–0.50)

## 2019-08-07 LAB — C-REACTIVE PROTEIN: CRP: 0.6 mg/dL (ref <1.0)

## 2019-08-07 MED ORDER — FUROSEMIDE 10 MG/ML IJ SOLN
40.0000 mg | Freq: Once | INTRAMUSCULAR | Status: AC
  Administered 2019-08-07: 40 mg via INTRAVENOUS
  Filled 2019-08-07: qty 4

## 2019-08-07 MED ORDER — METOPROLOL TARTRATE 50 MG PO TABS
50.0000 mg | ORAL_TABLET | Freq: Two times a day (BID) | ORAL | Status: DC
  Administered 2019-08-07 – 2019-08-13 (×12): 50 mg via ORAL
  Filled 2019-08-07 (×12): qty 1

## 2019-08-07 MED ORDER — INSULIN GLARGINE 100 UNIT/ML ~~LOC~~ SOLN
15.0000 [IU] | Freq: Every day | SUBCUTANEOUS | Status: DC
  Administered 2019-08-07: 15 [IU] via SUBCUTANEOUS
  Filled 2019-08-07: qty 0.15

## 2019-08-07 MED ORDER — INSULIN ASPART 100 UNIT/ML ~~LOC~~ SOLN
6.0000 [IU] | Freq: Three times a day (TID) | SUBCUTANEOUS | Status: DC
  Administered 2019-08-08 – 2019-08-09 (×4): 6 [IU] via SUBCUTANEOUS

## 2019-08-07 NOTE — Progress Notes (Signed)
Nurse went to give pt. Meds. Pt. Was laying on right side, oxygen level at 89%. Nurse asked pt. To take deep breaths she refused and she refused proning Nurse remain in room to watch pt. breathing she was anywhere between 89-91%. Pt. Was not in an distress will continue to monitor.

## 2019-08-07 NOTE — Progress Notes (Signed)
   08/07/19 0828  Vitals  Temp 98.4 F (36.9 C)  Temp Source Oral  BP 115/83  MAP (mmHg) 94  BP Location Left Arm  BP Method Automatic  Patient Position (if appropriate) Sitting  Pulse Rate (!) 130  Pulse Rate Source Monitor  Resp (!) 28  Oxygen Therapy  SpO2 (!) 87 %  O2 Device HFNC  O2 Flow Rate (L/min) 14 L/min  MEWS Score  MEWS Temp 0  MEWS Systolic 0  MEWS Pulse 3  MEWS RR 2  MEWS LOC 0  MEWS Score 5  MEWS Score Color Red   Pt is asymptomatic, report no SOB/chest pain. At bedside using incentive spirometer. MD made aware, will continue to monitor

## 2019-08-07 NOTE — Progress Notes (Signed)
PROGRESS NOTE    Christie Walker  JGG:[id number] DOB: 10-23-1970 DOA: 07/31/2019 PCP: Vevelyn Francois, NP    Brief Narrative:  [name] is a82 y.o.F with BMI 50, Bipolar in remission, DM and HTN who presented with 1 week fever, chills, cough, chest tightness/dyspnea and SOB with exertion.  In the ER, temp 100.70F, tachypneic, O2 sat 84% on room air. CXR with bilateral opacities. COVID+ on 3/16.    Assessment & Plan:   Principal Problem:   Pneumonia due to COVID-19 virus Active Problems:   HTN (hypertension)   Acute respiratory failure with hypoxia (HCC)   SIRS (systemic inflammatory response syndrome) (HCC)   Chest tightness   Bipolar disorder (HCC)   Type 2 diabetes mellitus without complication, without long-term current use of insulin (HCC)   Obesity, Class III, BMI 40-49.9 (morbid obesity) ([place])  #1 acute hypoxic respiratory failure secondary to acute COPD 19 viral pneumonia during ongoing COVID-19 pandemic, POA Patient had presented with fever, tachypnea, hypoxia.  Chest x-ray showed bilateral opacities.  COVID-19 positive.  Is felt that patient's respiratory failure likely also worsened due to obesity hypoventilation syndrome.  CT angiogram chest negative for PE however showed extensive groundglass opacities scattered throughout both lungs.  Chest x-ray on 08/04/2019 with bilateral airspace disease more severe on the left.  2D echo done on 08/05/2019 with a EF of 65 to 70%, moderate LVH, indeterminate diastolic parameters.  Current weight of 290.13 pounds from 315 pounds.  Patient with a urine output of 2 L over the past 24 hours.  Patient is -3.120 L during this hospitalization.  Inflammatory markers trending down.  Patient still hypoxic on 14 L high flow O2 with sats of 87%.  Patient status post full course of IV steroids, full course of IV remdesivir which was completed 08/04/2019.  Patient received Actemra on 08/01/2019.  Continue IV Solu-Medrol every 8 hours and taper.   Patient status post IV Lasix.  We will give another dose of 40 mg IV Lasix x1 and reassess in the morning.  Patient seen by PCCM who feel no role of antibiotics at this time, intubation should be based on increased work of breathing, keep sats greater than 85%.  Supportive care.  2.  Sinus tachycardia Likely secondary to problem #1.  TSH of 0.262.  Free T4 of 1.36.  T3 of 82.  Increase Lopressor to 50 mg twice daily.  Will likely need repeat thyroid function studies done in about 4 to 6 weeks post discharge.  3.  Hypertension Continue metoprolol.  Patient received IV Lasix.  We will give a dose of IV Lasix.  Follow.  4.  Metabolic syndrome/diabetes mellitus type 2, non-insulin-dependent diabetes mellitus Hemoglobin A1c 6.1.  Hold oral antihypoglycemic agents.  CBGs elevated likely secondary to steroids.  CBG of 183 this morning.  Increase Lantus 15 units daily.  Increase meal coverage NovoLog to 6 units 3 times daily.  Follow.  5.  History of bipolar disorder Continue Latuda.  Outpatient follow-up.  6.  Morbid obesity   DVT prophylaxis: Lovenox Code Status: Full Family Communication: Updated patient.  No family at bedside. Disposition Plan:  . Patient came from: Home            . Anticipated d/c place: Home . Barriers to d/c OR conditions which need to be met to effect a safe d/c: Home once acute respiratory failure has improved.  Patient currently on 14 L high flow nasal cannula with sats of 87%.   Consultants:  PCCM: Dr. Carlis Abbott 08/04/2019  Procedures:   CT angiogram chest 08/03/2019  Chest x-ray 07/31/2019, 08/04/2019  2D echo 08/05/2019    Antimicrobials:   None   Subjective: Patient who shortness of breath is improved.  Patient states she is ready to go home.  Denies any chest pain.  Patient still hypoxic on 14 L high flow nasal cannula with sats of 87%.  It is noted that patient had been refusing proning.  Patient however currently laying on his  side.  Objective: Vitals:   08/07/19 0545 08/07/19 0828 08/07/19 1022 08/07/19 1448  BP: 108/72 115/83 (!) 145/90 (!) 120/100  Pulse: (!) 105 (!) 130 (!) 126 (!) 127  Resp: (!) 30 (!) 28 (!) 22 (!) 22  Temp: 98.4 F (36.9 C) 98.4 F (36.9 C)  98.3 F (36.8 C)  TempSrc: Oral Oral  Oral  SpO2: (!) 89% (!) 87% 92% (!) 87%  Weight:      Height:        Intake/Output Summary (Last 24 hours) at 08/07/2019 1957 Last data filed at 08/07/2019 1847 Gross per 24 hour  Intake 600 ml  Output 1900 ml  Net -1300 ml   Filed Weights   07/31/19 1813 08/01/19 0052 08/06/19 0500  Weight: (!) 142.9 kg (!) 137.7 kg 131.6 kg    Examination:  General exam: Appears calm and comfortable  Respiratory system: Decreased breath sounds in the bases.  No use of accessory muscles of respiration.  Cardiovascular system: Tachycardia.  No JVD, no murmurs rubs or gallops.  No lower extremity edema. Gastrointestinal system: Abdomen is obese, nondistended, soft and nontender. No organomegaly or masses felt. Normal bowel sounds heard. Central nervous system: Alert and oriented. No focal neurological deficits. Extremities: Symmetric 5 x 5 power. Skin: No rashes, lesions or ulcers Psychiatry: Judgement and insight appear fair. Mood & affect flat.     Data Reviewed: I have personally reviewed following labs and imaging studies  CBC: Recent Labs  Lab 08/01/19 0420 08/01/19 0420 08/02/19 0420 08/02/19 0420 08/03/19 0435 08/04/19 0404 08/05/19 0426 08/06/19 0442 08/07/19 0401  WBC 4.6   < > 4.4   < > 4.6 5.7 6.5 12.1* 15.1*  NEUTROABS 4.0  --  3.5  --  3.6 4.1 5.4  --   --   HGB 13.1   < > 13.0   < > 13.6 14.3 15.4* 15.9* 17.0*  HCT 41.2   < > 41.0   < > 42.9 45.2 48.3* 49.5* 51.7*  MCV 92.6   < > 93.4   < > 93.3 93.2 91.7 91.8 90.1  PLT 192   < > 230   < > 279 345 408* 457* 485*   < > = values in this interval not displayed.   Basic Metabolic Panel: Recent Labs  Lab 08/03/19 0435 08/04/19 0404  08/05/19 0426 08/06/19 0442 08/07/19 0401  NA 143 146* 143 144 140  K 3.9 4.0 3.5 4.0 3.8  CL 105 106 103 105 103  CO2 '25 28 28 25 27  '$ GLUCOSE 167* 157* 184* 190* 184*  BUN '16 14 17 17 '$ 21*  CREATININE 0.61 0.82 0.83 0.72 0.81  CALCIUM 8.8* 9.2 9.2 9.4 8.9   GFR: Estimated Creatinine Clearance: 117.7 mL/min (by C-G formula based on SCr of 0.81 mg/dL). Liver Function Tests: Recent Labs  Lab 08/01/19 0420 08/02/19 0420 08/03/19 0435 08/04/19 0404 08/05/19 0426  AST 62* 59* 41 39 34  ALT 40 48* 42 44 48*  ALKPHOS 99  91 84 86 85  BILITOT 0.5 0.4 0.4 0.5 0.4  PROT 7.1 6.9 7.0 7.4 7.4  ALBUMIN 3.1* 2.9* 3.0* 3.2* 3.3*   No results for input(s): LIPASE, AMYLASE in the last 168 hours. No results for input(s): AMMONIA in the last 168 hours. Coagulation Profile: No results for input(s): INR, PROTIME in the last 168 hours. Cardiac Enzymes: No results for input(s): CKTOTAL, CKMB, CKMBINDEX, TROPONINI in the last 168 hours. BNP (last 3 results) No results for input(s): PROBNP in the last 8760 hours. HbA1C: Recent Labs    08/06/19 0442  HGBA1C 6.5*   CBG: Recent Labs  Lab 08/06/19 1627 08/06/19 2123 08/07/19 0728 08/07/19 1128 08/07/19 1631  GLUCAP 247* 216* 183* 275* 220*   Lipid Profile: No results for input(s): CHOL, HDL, LDLCALC, TRIG, CHOLHDL, LDLDIRECT in the last 72 hours. Thyroid Function Tests: Recent Labs    08/06/19 0442 08/06/19 1523  TSH 0.262*  --   FREET4  --  1.36*   Anemia Panel: No results for input(s): VITAMINB12, FOLATE, FERRITIN, TIBC, IRON, RETICCTPCT in the last 72 hours. Sepsis Labs: Recent Labs  Lab 08/03/19 1617  PROCALCITON <0.10    Recent Results (from the past 240 hour(s))  Blood Culture (routine x 2)     Status: None   Collection Time: 07/31/19  6:48 PM   Specimen: BLOOD  Result Value Ref Range Status   Specimen Description   Final    BLOOD RIGHT ANTECUBITAL Performed at [facility] [address] [place], [place] [address]    Special Requests   Final    BOTTLES DRAWN AEROBIC AND ANAEROBIC Blood Culture adequate volume Performed at [facility] [address], [place], [place] [address]    Culture   Final    NO GROWTH 5 DAYS Performed at [facility], [place] [address], [place], [place] [address]    Report Status 08/06/2019 FINAL  Final  Blood Culture (routine x 2)     Status: None   Collection Time: 08/01/19  4:20 AM   Specimen: BLOOD RIGHT HAND  Result Value Ref Range Status   Specimen Description   Final    BLOOD RIGHT HAND Performed at [facility] [address], [place], [place] [address]    Special Requests   Final    BOTTLES DRAWN AEROBIC AND ANAEROBIC Blood Culture adequate volume Performed at [facility] [address], [place], [place] [address]    Culture   Final    NO GROWTH 5 DAYS Performed at [facility], [place] [address], [place],  [address]    Report Status 08/06/2019 FINAL  Final         Radiology Studies: No results found.      Scheduled Meds: . vitamin C  500 mg Oral Daily  . enoxaparin (LOVENOX) injection  70 mg Subcutaneous Q24H  . fluticasone  2 spray Each Nare Daily  . gabapentin  100 mg Oral BID  . insulin aspart  0-20 Units Subcutaneous TID WC  . insulin aspart  0-5 Units Subcutaneous QHS  . insulin aspart  3 Units Subcutaneous TID WC  . insulin glargine  10 Units Subcutaneous QHS  . lurasidone  40 mg Oral Daily  . methylPREDNISolone (SOLU-MEDROL) injection  40 mg Intravenous Q8H  . metoprolol tartrate  25 mg Oral BID  . zinc sulfate  220 mg Oral Daily   Continuous Infusions:   LOS: 7 days  Time spent: 40 minutes    Irine Seal, MD [facility]   To contact the attending provider between 7A-7P or the covering provider during after hours 7P-7A, please log into the web site [email or web address] and access using universal [place]  password for that web site. If you do not have the password, please call the hospital operator.  08/07/2019, 7:57 PM

## 2019-08-07 NOTE — Progress Notes (Signed)
Inpatient Diabetes Program Recommendations  AACE/ADA: New Consensus Statement on Inpatient Glycemic Control (2015)  Target Ranges:  Prepandial:   less than 140 mg/dL      Peak postprandial:   less than 180 mg/dL (1-2 hours)      Critically ill patients:  140 - 180 mg/dL   Lab Results  Component Value Date   GLUCAP 275 (H) 08/07/2019   HGBA1C 6.5 (H) 08/06/2019    Review of Glycemic Control Results for Christie Walker, Christie Walker (MRN [id number]) as of 08/07/2019 13:07  Ref. Range 08/06/2019 08:06 08/06/2019 12:11 08/06/2019 16:27 08/06/2019 21:23 08/07/2019 07:28 08/07/2019 11:28  Glucose-Capillary Latest Ref Range: 70 - 99 mg/dL 373 (H) 578 (H) 978 (H) 216 (H) 183 (H) 275 (H)   Diabetes history: Dm 2 Outpatient Diabetes medications: metformin 500 mg Daily Current orders for Inpatient glycemic control:  Lantus 10 units Novolog 0-20 units tid + hs Novolog 3 units tid meal coverage  Solumedrol 40 mg Q8 hours  Inpatient Diabetes Program Recommendations:    - increase Lantus to 15 units - increase Novolog meal coverage to 5 units tid.  Thanks,  Christena Deem RN, MSN, BC-ADM Inpatient Diabetes Coordinator Team Pager [phone] (8a-5p)

## 2019-08-08 LAB — BASIC METABOLIC PANEL
Anion gap: 12 (ref 5–15)
BUN: 28 mg/dL — ABNORMAL HIGH (ref 6–20)
CO2: 25 mmol/L (ref 22–32)
Calcium: 9 mg/dL (ref 8.9–10.3)
Chloride: 100 mmol/L (ref 98–111)
Creatinine, Ser: 0.85 mg/dL (ref 0.44–1.00)
GFR calc Af Amer: >60 mL/min (ref >60)
GFR calc non Af Amer: >60 mL/min (ref >60)
Glucose, Bld: 231 mg/dL — ABNORMAL HIGH (ref 70–99)
Potassium: 3.8 mmol/L (ref 3.5–5.1)
Sodium: 137 mmol/L (ref 135–145)

## 2019-08-08 LAB — GLUCOSE, CAPILLARY
Glucose-Capillary: 237 mg/dL — ABNORMAL HIGH (ref 70–99)
Glucose-Capillary: 239 mg/dL — ABNORMAL HIGH (ref 70–99)
Glucose-Capillary: 293 mg/dL — ABNORMAL HIGH (ref 70–99)
Glucose-Capillary: 217 mg/dL — ABNORMAL HIGH (ref 70–99)

## 2019-08-08 LAB — FERRITIN: Ferritin: 230 ng/mL (ref 11–307)

## 2019-08-08 LAB — CBC
HCT: 54.5 % — ABNORMAL HIGH (ref 36.0–46.0)
Hemoglobin: 17.2 g/dL — ABNORMAL HIGH (ref 12.0–15.0)
MCH: 29.2 pg (ref 26.0–34.0)
MCHC: 31.6 g/dL (ref 30.0–36.0)
MCV: 92.5 fL (ref 80.0–100.0)
Platelets: 546 10*3/uL — ABNORMAL HIGH (ref 150–400)
RBC: 5.89 MIL/uL — ABNORMAL HIGH (ref 3.87–5.11)
RDW: 13.7 % (ref 11.5–15.5)
WBC: 17.3 10*3/uL — ABNORMAL HIGH (ref 4.0–10.5)
nRBC: 0 % (ref 0.0–0.2)

## 2019-08-08 LAB — D-DIMER, QUANTITATIVE: D-Dimer, Quant: 0.44 ug/mL-FEU (ref 0.00–0.50)

## 2019-08-08 LAB — C-REACTIVE PROTEIN: CRP: 1.1 mg/dL — ABNORMAL HIGH (ref <1.0)

## 2019-08-08 MED ORDER — INSULIN GLARGINE 100 UNIT/ML ~~LOC~~ SOLN
20.0000 [IU] | Freq: Every day | SUBCUTANEOUS | Status: DC
  Administered 2019-08-08: 20 [IU] via SUBCUTANEOUS
  Filled 2019-08-08: qty 0.2

## 2019-08-08 MED ORDER — SORBITOL 70 % SOLN
30.0000 mL | Status: AC
  Administered 2019-08-08 (×2): 30 mL via ORAL
  Filled 2019-08-08 (×2): qty 30

## 2019-08-08 NOTE — Progress Notes (Signed)
Patient is sitting in recliner chair at bedside with oxygen saturation at 100% on 14L  High flow nasal cannula. This RN wean patient's oxygen down to 6L  and pulse saturation remain in 95-98%. Will continue to monitor and wean oxygen as tolerated.

## 2019-08-08 NOTE — Progress Notes (Signed)
Patient is able to maintain oxygen saturation between 95-97% on 2L HFNC but desat down to 80s% on excertion. Will continue to monitor and wean as tolerated.

## 2019-08-08 NOTE — Progress Notes (Addendum)
PROGRESS NOTE    Lam Bjorklund Dupuy  IWL:[id number] DOB: Jun 14, 1970 DOA: 07/31/2019 PCP: Vevelyn Francois, NP    Brief Narrative:  Christie Walker is a87 y.o.F with BMI 50, Bipolar in remission, DM and HTN who presented with 1 week fever, chills, cough, chest tightness/dyspnea and SOB with exertion.  In the ER, temp 100.73F, tachypneic, O2 sat 84% on room air. CXR with bilateral opacities. COVID+ on 3/16.    Assessment & Plan:   Principal Problem:   Pneumonia due to COVID-19 virus Active Problems:   HTN (hypertension)   Acute respiratory failure with hypoxia (HCC)   SIRS (systemic inflammatory response syndrome) (HCC)   Chest tightness   Bipolar disorder (HCC)   Type 2 diabetes mellitus without complication, without long-term current use of insulin (HCC)   Obesity, Class III, BMI 40-49.9 (morbid obesity) ([place])  1 acute hypoxic respiratory failure secondary to acute COPD 19 viral pneumonia during ongoing COVID-19 pandemic, POA Patient had presented with fever, tachypnea, hypoxia.  Chest x-ray showed bilateral opacities.  COVID-19 positive.  Is felt that patient's respiratory failure likely also worsened due to obesity hypoventilation syndrome.  CT angiogram chest negative for PE however showed extensive groundglass opacities scattered throughout both lungs.  Chest x-ray on 08/04/2019 with bilateral airspace disease more severe on the left.  2D echo done on 08/05/2019 with a EF of 65 to 70%, moderate LVH, indeterminate diastolic parameters.  Current weight of 289.9 pounds from 290.13 pounds from 315 pounds.  Patient with a urine output of 1.65 L over the past 24 hours.  Patient is -3.650 L during this hospitalization.  Inflammatory markers trending down.  Patient still hypoxic on 14 L high flow O2 with sats of 94%.  Patient status post full course of IV steroids, full course of IV remdesivir which was completed 08/04/2019.  Patient received Actemra on 08/01/2019.  Continue IV Solu-Medrol every  8 hours and taper.  Patient status post IV Lasix.  Patient seen by PCCM who feel no role of antibiotics at this time, intubation should be based on increased work of breathing, keep sats greater than 85%.  Supportive care.  2.  Sinus tachycardia Likely secondary to problem #1.  TSH of 0.262.  Free T4 of 1.36.  T3 of 82. Improved with increased dose of Lopressor. Continue current dose of Lopressor 50 mg twice daily.  Will likely need repeat thyroid function studies done in about 4 to 6 weeks post discharge.  3.  Hypertension Blood pressure stable. Patient received doses of IV Lasix. Follow.  4.  Metabolic syndrome/diabetes mellitus type 2, non-insulin-dependent diabetes mellitus Hemoglobin A1c 6.1. CBG of 237 this morning. Steroids likely contributing to hyperglycemia. Hold oral antihypoglycemic agents. Increase Lantus to 20 units daily. Continue meal coverage NovoLog 6 units 3 times daily. Sliding scale insulin.  5.  History of bipolar disorder Latuda. Outpatient follow-up.   6.  Morbid obesity  7. Constipation Sorbitol p.o. x2 doses.   DVT prophylaxis: Lovenox Code Status: Full Family Communication: Updated patient.  No family at bedside. Disposition Plan:   Patient came from: Home             Anticipated d/c place: Home  Barriers to d/c OR conditions which need to be met to effect a safe d/c: Home once acute respiratory failure has improved.  Patient currently on 14 L high flow nasal cannula with sats of 94%.   Consultants:   PCCM: Dr. Carlis Abbott 08/04/2019  Procedures:   CT angiogram chest  08/03/2019  Chest x-ray 07/31/2019, 08/04/2019  2D echo 08/05/2019    Antimicrobials:   None   Subjective: Patient feels shortness of breath is improving daily. Denies chest pain. Patient noted to have complaints of constipation early on this morning and was given sorbitol with some improvement. Patient currently on 14 L high flow nasal cannula.  Objective: Vitals:   08/08/19 0211  08/08/19 0439 08/08/19 1148 08/08/19 1216  BP: 103/63 105/70 110/86   Pulse: (!) 113 (!) 118 (!) 111   Resp: (!) 22  (!) 24   Temp: 98.2 F (36.8 C) 97.8 F (36.6 C) 98 F (36.7 C)   TempSrc: Oral Oral Oral   SpO2: 92% (!) 88% 94% 96%  Weight:  131.5 kg    Height:        Intake/Output Summary (Last 24 hours) at 08/08/2019 1217 Last data filed at 08/08/2019 1053 Gross per 24 hour  Intake 560 ml  Output 1650 ml  Net -1090 ml   Filed Weights   08/01/19 0052 08/06/19 0500 08/08/19 0439  Weight: (!) 137.7 kg 131.6 kg 131.5 kg    Examination:  General exam: NAD  Respiratory system: Decreased breath sounds in the bases. No wheezing. Speaking in full sentences. No use of accessory muscles of respiration. Cardiovascular system: Tachycardia. No murmurs rubs or gallops. No JVD. No lower extremity edema.  Gastrointestinal system: Abdomen is soft, nontender, nondistended, obese, positive bowel sounds. No rebound. No guarding. Central nervous system: Alert and oriented. No focal neurological deficits. Extremities: Symmetric 5 x 5 power. Skin: No rashes, lesions or ulcers Psychiatry: Judgement and insight appear fair. Mood & affect flat.     Data Reviewed: I have personally reviewed following labs and imaging studies  CBC: Recent Labs  Lab 08/02/19 0420 08/02/19 0420 08/03/19 0435 08/03/19 0435 08/04/19 0404 08/05/19 0426 08/06/19 0442 08/07/19 0401 08/08/19 0342  WBC 4.4   < > 4.6   < > 5.7 6.5 12.1* 15.1* 17.3*  NEUTROABS 3.5  --  3.6  --  4.1 5.4  --   --   --   HGB 13.0   < > 13.6   < > 14.3 15.4* 15.9* 17.0* 17.2*  HCT 41.0   < > 42.9   < > 45.2 48.3* 49.5* 51.7* 54.5*  MCV 93.4   < > 93.3   < > 93.2 91.7 91.8 90.1 92.5  PLT 230   < > 279   < > 345 408* 457* 485* 546*   < > = values in this interval not displayed.   Basic Metabolic Panel: Recent Labs  Lab 08/04/19 0404 08/05/19 0426 08/06/19 0442 08/07/19 0401 08/08/19 0342  NA 146* 143 144 140 137  K 4.0 3.5  4.0 3.8 3.8  CL 106 103 105 103 100  CO2 _0 GLUCOSE 157* 184* 190* 184* 231*  BUN _1 21* 28*  CREATININE 0.82 0.83 0.72 0.81 0.85  CALCIUM 9.2 9.2 9.4 8.9 9.0   GFR: Estimated Creatinine Clearance: 112.1 mL/min (by C-G formula based on SCr of 0.85 mg/dL). Liver Function Tests: Recent Labs  Lab 08/02/19 0420 08/03/19 0435 08/04/19 0404 08/05/19 0426  AST 59* 41 39 34  ALT 48* 42 44 48*  ALKPHOS 91 84 86 85  BILITOT 0.4 0.4 0.5 0.4  PROT 6.9 7.0 7.4 7.4  ALBUMIN 2.9* 3.0* 3.2* 3.3*   No results for input(s): LIPASE, AMYLASE in the last 168 hours. No results for input(s): AMMONIA  in the last 168 hours. Coagulation Profile: No results for input(s): INR, PROTIME in the last 168 hours. Cardiac Enzymes: No results for input(s): CKTOTAL, CKMB, CKMBINDEX, TROPONINI in the last 168 hours. BNP (last 3 results) No results for input(s): PROBNP in the last 8760 hours. HbA1C: Recent Labs    08/06/19 0442  HGBA1C 6.5*   CBG: Recent Labs  Lab 08/07/19 1631 08/07/19 2101 08/08/19 0738 08/08/19 0814 08/08/19 1143  GLUCAP 220* 274* 239* 237* 293*   Lipid Profile: No results for input(s): CHOL, HDL, LDLCALC, TRIG, CHOLHDL, LDLDIRECT in the last 72 hours. Thyroid Function Tests: Recent Labs    08/06/19 0442 08/06/19 1523  TSH 0.262*  --   FREET4  --  1.36*   Anemia Panel: Recent Labs    08/08/19 0342  FERRITIN 230   Sepsis Labs: Recent Labs  Lab 08/03/19 1617  PROCALCITON <0.10    Recent Results (from the past 240 hour(s))  Blood Culture (routine x 2)     Status: None   Collection Time: 07/31/19  6:48 PM   Specimen: BLOOD  Result Value Ref Range Status   Specimen Description   Final    BLOOD RIGHT ANTECUBITAL Performed at [facility] [address], [place], [place] [address]    Special Requests   Final    BOTTLES DRAWN AEROBIC AND ANAEROBIC Blood Culture adequate volume Performed at [facility] [address] [place], [place] [address]    Culture   Final    NO GROWTH 5 DAYS Performed at [facility], [place] [address] [place], [place] [address]    Report Status 08/06/2019 FINAL  Final  Blood Culture (routine x 2)     Status: None   Collection Time: 08/01/19  4:20 AM   Specimen: BLOOD RIGHT HAND  Result Value Ref Range Status   Specimen Description   Final    BLOOD RIGHT HAND Performed at [facility] [address] [place], [place] [address]    Special Requests   Final    BOTTLES DRAWN AEROBIC AND ANAEROBIC Blood Culture adequate volume Performed at [facility] [address], [place], [place] [address]    Culture   Final    NO GROWTH 5 DAYS Performed at [facility], [place] [address] [place], [place] [address]    Report Status 08/06/2019 FINAL  Final         Radiology Studies: No results found.      Scheduled Meds:  vitamin C  500 mg Oral Daily   enoxaparin (LOVENOX) injection  70 mg Subcutaneous Q24H   fluticasone  2 spray Each Nare Daily   gabapentin  100 mg Oral BID   insulin aspart  0-20 Units Subcutaneous TID WC   insulin aspart  0-5 Units Subcutaneous QHS   insulin aspart  6 Units Subcutaneous TID WC   insulin glargine  20 Units Subcutaneous QHS   lurasidone  40 mg Oral Daily   methylPREDNISolone (SOLU-MEDROL) injection  40 mg Intravenous Q8H   metoprolol tartrate  50 mg Oral BID   zinc sulfate  220 mg Oral Daily   Continuous Infusions:   LOS: 8 days    Time spent: 40 minutes    Irine Seal, MD [facility]   To contact the attending provider between 7A-7P or the covering provider during after hours 7P-7A, please log into the web site [email or web address] and access using universal [place] password for  that web site. If you do not have the password, please call the hospital operator.  08/08/2019, 12:17 PM

## 2019-08-08 NOTE — Evaluation (Signed)
Physical Therapy Evaluation Patient Details Name: Christie Walker MRN: [id number] DOB: December 03, 1970 Today's Date: 08/08/2019   History of Present Illness  Christie Walker is a 49 y.o. F with BMI 50, Bipolar in remission, DM and HTN who presented3/24/21  with 1 week fever, chills, cough, chest tightness/dyspnea and SOB with exertion.  In the ER, temp 100.69F, tachypneic, O2 sat 84% on room air.  CXR with bilateral opacities.  COVID+ on 3/16.  Clinical Impression  The  Patient received on 12 L HFNC with SPO2(finger sensor) 87% HR 120-130. Patient ambulated x20' to Br,patient on toilet ~ 8 mins. When standing to pull up briefs, patient had staring spell, felt clammy, Recliner brought to BR . Patient able to stand and walk 5' to recliner.  BP 86/50, HR 130, SPO2 77%  which it was noted that the tank had run out. Patient placed on HFNC 12 L with return to 86% within 1 minute. BP taken 10 mins post episode_91/66. Sensor placed on ear lobe with SPO2 reading 93% vs 87% on the finger. RN aware of brief ? Hypotensive episode. Pt admitted with above diagnosis. Pt currently with functional limitations due to the deficits listed below (see PT Problem List). Pt will benefit from skilled PT to increase their independence and safety with mobility to allow discharge to the venue listed below.       Follow Up Recommendations Home health PT    Equipment Recommendations  None recommended by PT -   Recommendations for Other Services   OT    Precautions / Restrictions Precautions Precautions: Fall Precaution Comments: orthostatic      Mobility  Bed Mobility Overal bed mobility: Independent             General bed mobility comments: no  Transfers Overall transfer level: Needs assistance Equipment used: 1 person hand held assist Transfers: Sit to/from Stand Sit to Stand: Min assist         General transfer comment: steady assist to rise, cues to use rail  at  toilet  Ambulation/Gait Ambulation/Gait assistance: Min assist Gait Distance (Feet): 15 Feet(then) Assistive device: 1 person hand held assist Gait Pattern/deviations: Step-to pattern;Staggering right;Staggering left Gait velocity: decr   General Gait Details: Steady assist  , holds to foot board. Patient became clammy and less verbal. assisted to stand and ambuleted 5' to recliner. Patient with  BP 86/50. RN in room HR up in 130's and SPO2 finger-77% as O2 tank had run out while in BR.  Stairs            Wheelchair Mobility    Modified Rankin (Stroke Patients Only)       Balance Overall balance assessment: Needs assistance Sitting-balance support: Feet supported;No upper extremity supported Sitting balance-Leahy Scale: Fair     Standing balance support: Single extremity supported;During functional activity Standing balance-Leahy Scale: Poor Standing balance comment: unsteady, requires support                             Pertinent Vitals/Pain Pain Assessment: No/denies pain    Home Living Family/patient expects to be discharged to:: Private residence   Available Help at Discharge: Family;Available 24 hours/day Type of Home: Apartment Home Access: Level entry     Home Layout: One level Home Equipment: None      Prior Function Level of Independence: Independent               Hand Dominance  Dominant Hand: Right    Extremity/Trunk Assessment   Upper Extremity Assessment Upper Extremity Assessment: Defer to OT evaluation    Lower Extremity Assessment Lower Extremity Assessment: Generalized weakness    Cervical / Trunk Assessment Cervical / Trunk Assessment: Normal  Communication   Communication: No difficulties(some delay to answer)  Cognition Arousal/Alertness: Awake/alert Behavior During Therapy: WFL for tasks assessed/performed;Flat affect Overall Cognitive Status: No family/caregiver present to determine baseline cognitive  functioning Area of Impairment: Attention;Following commands;Awareness;Safety/judgement                   Current Attention Level: Selective   Following Commands: Follows one step commands inconsistently Safety/Judgement: Decreased awareness of deficits Awareness: Emergent Problem Solving: Slow processing;Decreased initiation;Requires verbal cues General Comments: Had been up to bed edge with alarm deployed. delayed in  getting  up from toilet,, cues to find out if she was finished with BM multiple asks.      General Comments      Exercises Other Exercises Other Exercises: IS x 10, pulls 1000 cueas fior technique   Assessment/Plan    PT Assessment Patient needs continued PT services  PT Problem List Decreased strength;Decreased mobility;Decreased safety awareness;Decreased knowledge of precautions;Decreased activity tolerance;Decreased cognition;Cardiopulmonary status limiting activity;Decreased knowledge of use of DME       PT Treatment Interventions DME instruction;Therapeutic activities;Gait training;Therapeutic exercise;Patient/family education;Functional mobility training    PT Goals (Current goals can be found in the Care Plan section)  Acute Rehab PT Goals Patient Stated Goal: I want to go home today PT Goal Formulation: With patient Time For Goal Achievement: 08/22/19    Frequency Min 3X/week   Barriers to discharge        Co-evaluation               AM-PAC PT "6 Clicks" Mobility  Outcome Measure Help needed turning from your back to your side while in a flat bed without using bedrails?: None Help needed moving from lying on your back to sitting on the side of a flat bed without using bedrails?: None Help needed moving to and from a bed to a chair (including a wheelchair)?: A Little Help needed standing up from a chair using your arms (e.g., wheelchair or bedside chair)?: A Little Help needed to walk in hospital room?: A Lot Help needed climbing  3-5 steps with a railing? : A Lot 6 Click Score: 18    End of Session Equipment Utilized During Treatment: Oxygen Activity Tolerance: Treatment limited secondary to medical complications (Comment) Patient left: in chair;with call bell/phone within reach;with chair alarm set Nurse Communication: Mobility status PT Visit Diagnosis: Unsteadiness on feet (R26.81);Difficulty in walking, not elsewhere classified (R26.2)    Time: 6269-4854 PT Time Calculation (min) (ACUTE ONLY): 45 min   Charges:   PT Evaluation $PT Eval Moderate Complexity: 1 Mod PT Treatments $Gait Training: 8-22 mins $Self Care/Home Management: [place] Pager [phone] Office [phone]   Claretha Cooper 08/08/2019, 1:13 PM

## 2019-08-09 LAB — BASIC METABOLIC PANEL
Anion gap: 12 (ref 5–15)
BUN: 27 mg/dL — ABNORMAL HIGH (ref 6–20)
CO2: 28 mmol/L (ref 22–32)
Calcium: 9.1 mg/dL (ref 8.9–10.3)
Chloride: 98 mmol/L (ref 98–111)
Creatinine, Ser: 0.98 mg/dL (ref 0.44–1.00)
GFR calc Af Amer: >60 mL/min (ref >60)
GFR calc non Af Amer: >60 mL/min (ref >60)
Glucose, Bld: 200 mg/dL — ABNORMAL HIGH (ref 70–99)
Potassium: 4 mmol/L (ref 3.5–5.1)
Sodium: 138 mmol/L (ref 135–145)

## 2019-08-09 LAB — CBC WITH DIFFERENTIAL/PLATELET
Abs Immature Granulocytes: 0.2 10*3/uL — ABNORMAL HIGH (ref 0.00–0.07)
Basophils Absolute: 0 10*3/uL (ref 0.0–0.1)
Basophils Relative: 0 %
Eosinophils Absolute: 0 10*3/uL (ref 0.0–0.5)
Eosinophils Relative: 0 %
HCT: 52.5 % — ABNORMAL HIGH (ref 36.0–46.0)
Hemoglobin: 16.7 g/dL — ABNORMAL HIGH (ref 12.0–15.0)
Immature Granulocytes: 1 %
Lymphocytes Relative: 8 %
Lymphs Abs: 1.4 10*3/uL (ref 0.7–4.0)
MCH: 29.4 pg (ref 26.0–34.0)
MCHC: 31.8 g/dL (ref 30.0–36.0)
MCV: 92.4 fL (ref 80.0–100.0)
Monocytes Absolute: 0.8 10*3/uL (ref 0.1–1.0)
Monocytes Relative: 5 %
Neutro Abs: 14.1 10*3/uL — ABNORMAL HIGH (ref 1.7–7.7)
Neutrophils Relative %: 86 %
Platelets: 607 10*3/uL — ABNORMAL HIGH (ref 150–400)
RBC: 5.68 MIL/uL — ABNORMAL HIGH (ref 3.87–5.11)
RDW: 13.9 % (ref 11.5–15.5)
WBC: 16.5 10*3/uL — ABNORMAL HIGH (ref 4.0–10.5)
nRBC: 0 % (ref 0.0–0.2)

## 2019-08-09 LAB — GLUCOSE, CAPILLARY
Glucose-Capillary: 233 mg/dL — ABNORMAL HIGH (ref 70–99)
Glucose-Capillary: 328 mg/dL — ABNORMAL HIGH (ref 70–99)
Glucose-Capillary: 300 mg/dL — ABNORMAL HIGH (ref 70–99)
Glucose-Capillary: 250 mg/dL — ABNORMAL HIGH (ref 70–99)
Glucose-Capillary: 224 mg/dL — ABNORMAL HIGH (ref 70–99)

## 2019-08-09 LAB — MAGNESIUM: Magnesium: 2.8 mg/dL — ABNORMAL HIGH (ref 1.7–2.4)

## 2019-08-09 MED ORDER — INSULIN ASPART 100 UNIT/ML ~~LOC~~ SOLN
8.0000 [IU] | Freq: Three times a day (TID) | SUBCUTANEOUS | Status: DC
  Administered 2019-08-09 – 2019-08-13 (×11): 8 [IU] via SUBCUTANEOUS

## 2019-08-09 MED ORDER — SENNOSIDES-DOCUSATE SODIUM 8.6-50 MG PO TABS
1.0000 | ORAL_TABLET | Freq: Two times a day (BID) | ORAL | Status: DC
  Administered 2019-08-09 – 2019-08-13 (×9): 1 via ORAL
  Filled 2019-08-09 (×9): qty 1

## 2019-08-09 MED ORDER — INSULIN GLARGINE 100 UNIT/ML ~~LOC~~ SOLN
24.0000 [IU] | Freq: Every day | SUBCUTANEOUS | Status: DC
  Administered 2019-08-09 – 2019-08-12 (×4): 24 [IU] via SUBCUTANEOUS
  Filled 2019-08-09 (×5): qty 0.24

## 2019-08-09 NOTE — Progress Notes (Addendum)
PROGRESS NOTE    Christie Walker  OIZ:[id number] DOB: 07/14/1970 DOA: 07/31/2019 PCP: Vevelyn Francois, NP    Brief Narrative:  Christie Walker is a29 y.o.F with BMI 50, Bipolar in remission, DM and HTN who presented with 1 week fever, chills, cough, chest tightness/dyspnea and SOB with exertion.  In the ER, temp 100.44F, tachypneic, O2 sat 84% on room air. CXR with bilateral opacities. COVID+ on 3/16.    Assessment & Plan:   Principal Problem:   Pneumonia due to COVID-19 virus Active Problems:   HTN (hypertension)   Acute respiratory failure with hypoxia (HCC)   SIRS (systemic inflammatory response syndrome) (HCC)   Chest tightness   Bipolar disorder (HCC)   Type 2 diabetes mellitus without complication, without long-term current use of insulin (HCC)   Obesity, Class III, BMI 40-49.9 (morbid obesity) ([place])  1 acute hypoxic respiratory failure secondary to acute COPD 19 viral pneumonia during ongoing COVID-19 pandemic, POA Patient had presented with fever, tachypnea, hypoxia.  Chest x-ray showed bilateral opacities.  COVID-19 positive.  Is felt that patient's respiratory failure likely also worsened due to obesity hypoventilation syndrome.  CT angiogram chest negative for PE however showed extensive groundglass opacities scattered throughout both lungs.  Chest x-ray on 08/04/2019 with bilateral airspace disease more severe on the left.  2D echo done on 08/05/2019 with a EF of 65 to 70%, moderate LVH, indeterminate diastolic parameters.  Current weight of 289.1 pounds from 289.9 pounds from 290.13 pounds from 315 pounds.  Patient with a urine output of not recorded over the past 24 hours. Patient is - 2.930 L during this hospitalization.  Inflammatory markers trending down.  Patient still hypoxic on 2 L high flow O2 however noted to desat into the 70s on ambulation.  Patient status post full course of IV steroids, full course of IV remdesivir which was completed 08/04/2019.  Patient  received Actemra on 08/01/2019.  Continue IV Solu-Medrol every 8 hours and taper.  Patient status post IV Lasix.  Patient seen by PCCM who feel no role of antibiotics at this time, intubation should be based on increased work of breathing, keep sats greater than 85%.  Supportive care.  2.  Sinus tachycardia Likely secondary to problem #1.  TSH of 0.262.  Free T4 of 1.36.  T3 of 82. Improved with increased dose of Lopressor. Continue current dose of Lopressor 50 mg twice daily.  Will likely need repeat thyroid function studies done in about 4 to 6 weeks post discharge.  3.  Hypertension Blood pressure stable. Patient received doses of IV Lasix. Follow.  4.  Metabolic syndrome/diabetes mellitus type 2, non-insulin-dependent diabetes mellitus Hemoglobin A1c 6.1. CBG of two thirty-three this morning. Steroids likely contributing to hyperglycemia.  Continue to hold oral antihypoglycemic agents. Increase Lantus to 24 units daily.  Increase meal coverage NovoLog 8 units 3 times daily. Sliding scale insulin.  5.  History of bipolar disorder Continue Latuda. Outpatient follow-up.   6.  Morbid obesity  7. Constipation Improved with sorbitol.  Patient started on Senokot-S twice daily.    DVT prophylaxis: Lovenox Code Status: Full Family Communication: Updated patient.  No family at bedside. Disposition Plan:  . Patient came from: Home            . Anticipated d/c place: Home . Barriers to d/c OR conditions which need to be met to effect a safe d/c: Home once acute respiratory failure has improved.  Improvement with ambulatory sats as patient noted to desat  into the 70s on ambulation.   Consultants:   PCCM: Dr. Carlis Abbott 08/04/2019  Procedures:   CT angiogram chest 08/03/2019  Chest x-ray 07/31/2019, 08/04/2019  2D echo 08/05/2019    Antimicrobials:   None   Subjective: Patient sitting up on the side of the bed ordering her lunch.  Feels shortness of breath improving.  Denies any chest  pain.  Hypoxia improving patient currently on 2 L nasal cannula with sats ranging from 89 to 97%.  Patient noted to have ambulatory desats into the 52s per RN.    Objective: Vitals:   08/09/19 1005 08/09/19 1035 08/09/19 1200 08/09/19 1215  BP: 114/81     Pulse: (!) 133     Resp: (!) 26 (!) 33 (!) 41 (!) 23  Temp: 98.2 F (36.8 C)     TempSrc: Oral     SpO2: 98%     Weight:      Height:        Intake/Output Summary (Last 24 hours) at 08/09/2019 1244 Last data filed at 08/09/2019 1100 Gross per 24 hour  Intake 360 ml  Output --  Net 360 ml   Filed Weights   08/06/19 0500 08/08/19 0439 08/09/19 0553  Weight: 131.6 kg 131.5 kg 131.1 kg    Examination:  General exam: NAD  Respiratory system: Decreased breath sounds in the bases.  Fair air movement.  No wheezing noted.  Speaking in full sentences.  No use of accessory muscles of respiration. Cardiovascular system: Tachycardia.  No murmurs rubs or gallops.  No JVD.  No lower extremity edema.  Gastrointestinal system: Abdomen is nontender, nondistended, soft, positive bowel sounds.  No rebound.  No guarding.  Central nervous system: Alert and oriented. No focal neurological deficits. Extremities: Symmetric 5 x 5 power. Skin: No rashes, lesions or ulcers Psychiatry: Judgement and insight appear fair. Mood & affect flat.     Data Reviewed: I have personally reviewed following labs and imaging studies  CBC: Recent Labs  Lab 08/03/19 0435 08/03/19 0435 08/04/19 0404 08/04/19 0404 08/05/19 0426 08/06/19 0442 08/07/19 0401 08/08/19 0342 08/09/19 0422  WBC 4.6   < > 5.7   < > 6.5 12.1* 15.1* 17.3* 16.5*  NEUTROABS 3.6  --  4.1  --  5.4  --   --   --  14.1*  HGB 13.6   < > 14.3   < > 15.4* 15.9* 17.0* 17.2* 16.7*  HCT 42.9   < > 45.2   < > 48.3* 49.5* 51.7* 54.5* 52.5*  MCV 93.3   < > 93.2   < > 91.7 91.8 90.1 92.5 92.4  PLT 279   < > 345   < > 408* 457* 485* 546* 607*   < > = values in this interval not displayed.   Basic  Metabolic Panel: Recent Labs  Lab 08/05/19 0426 08/06/19 0442 08/07/19 0401 08/08/19 0342 08/09/19 0422  NA 143 144 140 137 138  K 3.5 4.0 3.8 3.8 4.0  CL 103 105 103 100 98  CO2 '28 25 27 25 28  '$ GLUCOSE 184* 190* 184* 231* 200*  BUN 17 17 21* 28* 27*  CREATININE 0.83 0.72 0.81 0.85 0.98  CALCIUM 9.2 9.4 8.9 9.0 9.1  MG  --   --   --   --  2.8*   GFR: Estimated Creatinine Clearance: 97 mL/min (by C-G formula based on SCr of 0.98 mg/dL). Liver Function Tests: Recent Labs  Lab 08/03/19 0435 08/04/19 0404 08/05/19 6144  AST 41 39 34  ALT 42 44 48*  ALKPHOS 84 86 85  BILITOT 0.4 0.5 0.4  PROT 7.0 7.4 7.4  ALBUMIN 3.0* 3.2* 3.3*   No results for input(s): LIPASE, AMYLASE in the last 168 hours. No results for input(s): AMMONIA in the last 168 hours. Coagulation Profile: No results for input(s): INR, PROTIME in the last 168 hours. Cardiac Enzymes: No results for input(s): CKTOTAL, CKMB, CKMBINDEX, TROPONINI in the last 168 hours. BNP (last 3 results) No results for input(s): PROBNP in the last 8760 hours. HbA1C: No results for input(s): HGBA1C in the last 72 hours. CBG: Recent Labs  Lab 08/08/19 0814 08/08/19 1143 08/08/19 2022 08/09/19 0747 08/09/19 1106  GLUCAP 237* 293* 217* 233* 328*   Lipid Profile: No results for input(s): CHOL, HDL, LDLCALC, TRIG, CHOLHDL, LDLDIRECT in the last 72 hours. Thyroid Function Tests: Recent Labs    08/06/19 1523  FREET4 1.36*   Anemia Panel: Recent Labs    08/08/19 0342  FERRITIN 230   Sepsis Labs: Recent Labs  Lab 08/03/19 1617  PROCALCITON <0.10    Recent Results (from the past 240 hour(s))  Blood Culture (routine x 2)     Status: None   Collection Time: 07/31/19  6:48 PM   Specimen: BLOOD  Result Value Ref Range Status   Specimen Description   Final    BLOOD RIGHT ANTECUBITAL Performed at [facility] [address], [place], [place] [address]    Special Requests   Final    BOTTLES  DRAWN AEROBIC AND ANAEROBIC Blood Culture adequate volume Performed at [facility] [address], [place], [place] [address]    Culture   Final    NO GROWTH 5 DAYS Performed at [facility], [place] [address], [place], [place] [address]    Report Status 08/06/2019 FINAL  Final  Blood Culture (routine x 2)     Status: None   Collection Time: 08/01/19  4:20 AM   Specimen: BLOOD RIGHT HAND  Result Value Ref Range Status   Specimen Description   Final    BLOOD RIGHT HAND Performed at [facility] [address], [place], [place] [address]    Special Requests   Final    BOTTLES DRAWN AEROBIC AND ANAEROBIC Blood Culture adequate volume Performed at [facility] [address], [place], [place] [address]    Culture   Final    NO GROWTH 5 DAYS Performed at [facility], [place] [address] [place], [place] [address]    Report Status 08/06/2019 FINAL  Final         Radiology Studies: No results found.      Scheduled Meds: . vitamin C  500 mg Oral Daily  . enoxaparin (LOVENOX) injection  70 mg Subcutaneous Q24H  . fluticasone  2 spray Each Nare Daily  . gabapentin  100 mg Oral BID  . insulin aspart  0-20 Units Subcutaneous TID WC  . insulin aspart  0-5 Units Subcutaneous QHS  . insulin aspart  8 Units Subcutaneous TID WC  . insulin glargine  24 Units Subcutaneous QHS  . lurasidone  40 mg Oral Daily  . methylPREDNISolone (SOLU-MEDROL) injection  40 mg Intravenous Q8H  . metoprolol tartrate  50 mg Oral BID  . senna-docusate  1 tablet Oral BID  . zinc sulfate  220 mg Oral Daily   Continuous Infusions:   LOS: 9 days    Time spent:  40 minutes    Irine Seal, MD [facility]   To contact the attending provider between 7A-7P or the covering provider during after hours 7P-7A, please log into the web site [email or web address] and access using universal [place] password for that web site. If you do  not have the password, please call the hospital operator.  08/09/2019, 12:44 PM

## 2019-08-09 NOTE — Progress Notes (Signed)
Patient ambulated in room this morning.  Sats dropped to 70s/80s on 2L [place].  Patient quickly returned to 95 on 2L [place] when at rest.  Will continue to monitor.

## 2019-08-09 NOTE — Progress Notes (Signed)
Occupational Therapy Evaluation Patient Details Name: Christie Walker MRN: [id number] DOB: 12/30/1970 Today's Date: 08/09/2019    History of Present Illness Ms Vowell is a 49 y.o. F with BMI 50, Bipolar in remission, DM and HTN who presented3/24/21  with 1 week fever, chills, cough, chest tightness/dyspnea and SOB with exertion.  In the ER, temp 100.74F, tachypneic, O2 sat 84% on room air.  CXR with bilateral opacities.  COVID+ on 3/16.   Clinical Impression   PTA, pt independent with ADL and mobility. Pt currently requires set up for ADL and S for mobility. SpO2 desat into mid 70s with exertion without apparent DOE however poor pleth. Quickly rebounds to 88-90. Incentive spirometer  - able to pull 500-750 - required multiple vc for correct use. Flat affect with increased time required for processing. Do not anticipate the need for OT follow up. Will follow acutely to facilitate safe DC home.     Follow Up Recommendations  No OT follow up;Supervision - Intermittent    Equipment Recommendations  3 in 1 bedside commode    Recommendations for Other Services       Precautions / Restrictions Precautions Precautions: Fall Restrictions Weight Bearing Restrictions: No      Mobility Bed Mobility Overal bed mobility: Modified Independent                Transfers Overall transfer level: Needs assistance   Transfers: Sit to/from Stand Sit to Stand: Supervision              Balance Overall balance assessment: Mild deficits observed, not formally tested                                         ADL either performed or assessed with clinical judgement   ADL Overall ADL's : Needs assistance/impaired                                     Functional mobility during ADLs: Supervision/safety General ADL Comments: overall set up for ADL tasks. Desats into high 70s with exertion however minimal DOE     Vision         Perception      Praxis      Pertinent Vitals/Pain Pain Assessment: No/denies pain     Hand Dominance Right   Extremity/Trunk Assessment Upper Extremity Assessment Upper Extremity Assessment: Overall WFL for tasks assessed   Lower Extremity Assessment Lower Extremity Assessment: Defer to PT evaluation   Cervical / Trunk Assessment Cervical / Trunk Assessment: Normal   Communication     Cognition Arousal/Alertness: Awake/alert Behavior During Therapy: Flat affect Overall Cognitive Status: No family/caregiver present to determine baseline cognitive functioning Area of Impairment: Attention;Safety/judgement;Awareness;Problem solving                   Current Attention Level: Selective   Following Commands: Follows one step commands consistently Safety/Judgement: Decreased awareness of deficits Awareness: Emergent Problem Solving: Slow processing General Comments: will further assess   General Comments       Exercises Exercises: Other exercises Other Exercises Other Exercises: Covid HEP with level 2 theraband. Completed 10 reps each exercise   Shoulder Instructions      Home Living Family/patient expects to be discharged to:: Private residence Living Arrangements: Parent Available Help at Discharge: Family;Available 24 hours/day  Type of Home: Apartment Home Access: Level entry     Home Layout: One level     Bathroom Shower/Tub: Teacher, early years/pre: Standard Bathroom Accessibility: No   Home Equipment: None          Prior Functioning/Environment Level of Independence: Independent                 OT Problem List: Decreased strength;Decreased activity tolerance;Decreased safety awareness;Decreased cognition;Cardiopulmonary status limiting activity;Obesity      OT Treatment/Interventions: Self-care/ADL training;Therapeutic exercise;Neuromuscular education;Energy conservation;DME and/or AE instruction;Therapeutic activities;Cognitive  remediation/compensation;Patient/family education    OT Goals(Current goals can be found in the care plan section) Acute Rehab OT Goals Patient Stated Goal: to get better OT Goal Formulation: With patient Time For Goal Achievement: 08/23/19 Potential to Achieve Goals: Good  OT Frequency: Min 3X/week   Barriers to D/C:            Co-evaluation              AM-PAC OT "6 Clicks" Daily Activity     Outcome Measure Help from another person eating meals?: None Help from another person taking care of personal grooming?: A Little Help from another person toileting, which includes using toliet, bedpan, or urinal?: A Little Help from another person bathing (including washing, rinsing, drying)?: A Little Help from another person to put on and taking off regular upper body clothing?: A Little Help from another person to put on and taking off regular lower body clothing?: A Little 6 Click Score: 19   End of Session Equipment Utilized During Treatment: Oxygen(2L; increased to 3L for mobility) Nurse Communication: Mobility status  Activity Tolerance: Patient tolerated treatment well Patient left: in chair;with call bell/phone within reach;with chair alarm set  OT Visit Diagnosis: Unsteadiness on feet (R26.81);Muscle weakness (generalized) (M62.81)                Time: 1093-2355 OT Time Calculation (min): 25 min Charges:  OT General Charges $OT Visit: 1 Visit OT Evaluation $OT Eval Moderate Complexity: 1 Mod OT Treatments $Self Care/Home Management : 8-22 mins  Maurie Boettcher, OT/L   Acute OT Clinical Specialist Acute Rehabilitation Services Pager [phone] Office [phone]   [facility] 08/09/2019, 4:44 PM

## 2019-08-10 LAB — BASIC METABOLIC PANEL
Anion gap: 14 (ref 5–15)
BUN: 27 mg/dL — ABNORMAL HIGH (ref 6–20)
CO2: 27 mmol/L (ref 22–32)
Calcium: 9.2 mg/dL (ref 8.9–10.3)
Chloride: 97 mmol/L — ABNORMAL LOW (ref 98–111)
Creatinine, Ser: 0.95 mg/dL (ref 0.44–1.00)
GFR calc Af Amer: >60 mL/min (ref >60)
GFR calc non Af Amer: >60 mL/min (ref >60)
Glucose, Bld: 220 mg/dL — ABNORMAL HIGH (ref 70–99)
Potassium: 3.8 mmol/L (ref 3.5–5.1)
Sodium: 138 mmol/L (ref 135–145)

## 2019-08-10 LAB — CBC
HCT: 52 % — ABNORMAL HIGH (ref 36.0–46.0)
Hemoglobin: 16.7 g/dL — ABNORMAL HIGH (ref 12.0–15.0)
MCH: 29.5 pg (ref 26.0–34.0)
MCHC: 32.1 g/dL (ref 30.0–36.0)
MCV: 91.7 fL (ref 80.0–100.0)
Platelets: 563 10*3/uL — ABNORMAL HIGH (ref 150–400)
RBC: 5.67 MIL/uL — ABNORMAL HIGH (ref 3.87–5.11)
RDW: 13.7 % (ref 11.5–15.5)
WBC: 16.5 10*3/uL — ABNORMAL HIGH (ref 4.0–10.5)
nRBC: 0 % (ref 0.0–0.2)

## 2019-08-10 LAB — GLUCOSE, CAPILLARY
Glucose-Capillary: 164 mg/dL — ABNORMAL HIGH (ref 70–99)
Glucose-Capillary: 173 mg/dL — ABNORMAL HIGH (ref 70–99)
Glucose-Capillary: 207 mg/dL — ABNORMAL HIGH (ref 70–99)
Glucose-Capillary: 231 mg/dL — ABNORMAL HIGH (ref 70–99)

## 2019-08-10 LAB — FERRITIN: Ferritin: 346 ng/mL — ABNORMAL HIGH (ref 11–307)

## 2019-08-10 LAB — C-REACTIVE PROTEIN: CRP: 0.8 mg/dL (ref <1.0)

## 2019-08-10 LAB — D-DIMER, QUANTITATIVE: D-Dimer, Quant: 0.51 ug/mL-FEU — ABNORMAL HIGH (ref 0.00–0.50)

## 2019-08-10 MED ORDER — FUROSEMIDE 20 MG PO TABS
20.0000 mg | ORAL_TABLET | Freq: Every day | ORAL | Status: DC
  Administered 2019-08-10 – 2019-08-13 (×4): 20 mg via ORAL
  Filled 2019-08-10 (×4): qty 1

## 2019-08-10 MED ORDER — METHYLPREDNISOLONE SODIUM SUCC 40 MG IJ SOLR
40.0000 mg | Freq: Two times a day (BID) | INTRAMUSCULAR | Status: DC
  Administered 2019-08-10 – 2019-08-12 (×4): 40 mg via INTRAVENOUS
  Filled 2019-08-10 (×4): qty 1

## 2019-08-10 NOTE — Progress Notes (Signed)
PROGRESS NOTE    Christie Walker  KXF:[id number] DOB: [birth year] DOA: 07/31/2019 PCP: Christie Francois, NP    Brief Narrative:  Christie Walker is a53 y.o.F with BMI 50, Bipolar in remission, DM and HTN who presented with 1 week fever, chills, cough, chest tightness/dyspnea and SOB with exertion.  In the ER, temp 100.65F, tachypneic, O2 sat 84% on room air. CXR with bilateral opacities. COVID+ on 3/16.    Assessment & Plan:   Principal Problem:   Pneumonia due to COVID-19 virus Active Problems:   HTN (hypertension)   Acute respiratory failure with hypoxia (HCC)   SIRS (systemic inflammatory response syndrome) (HCC)   Chest tightness   Bipolar disorder (HCC)   Type 2 diabetes mellitus without complication, without long-term current use of insulin (HCC)   Obesity, Class III, BMI 40-49.9 (morbid obesity) ([place])  1 acute hypoxic respiratory failure secondary to acute COPD 19 viral pneumonia during ongoing COVID-19 pandemic, POA Patient had presented with fever, tachypnea, hypoxia.  Chest x-ray showed bilateral opacities.  COVID-19 positive.  Is felt that patient's respiratory failure likely also worsened due to obesity hypoventilation syndrome.  CT angiogram chest negative for PE however showed extensive groundglass opacities scattered throughout both lungs.  Chest x-ray on 08/04/2019 with bilateral airspace disease more severe on the left.  2D echo done on 08/05/2019 with a EF of 65 to 70%, moderate LVH, indeterminate diastolic parameters.  Current weight of 291.01 pounds from 289.1 pounds from 289.9 pounds from 290.13 pounds from 315 pounds.  Patient with a urine output not recorded over the past 24 hours. Patient is - 2.490 L during this hospitalization.  Inflammatory markers trending down.  Patient still hypoxic on 2 L high flow O2 however noted to desat into the 70s on ambulation.  Patient status post full course of IV steroids, full course of IV remdesivir which was completed  08/04/2019.  Patient received Actemra on 08/01/2019.  Decrease IV Solu-Medrol to every 12 hours and taper.  Status post IV Lasix.  Will resume home dose oral Lasix. Patient seen by PCCM who feel no role of antibiotics at this time, intubation should be based on increased work of breathing, keep sats greater than 85%.  Supportive care.  2.  Sinus tachycardia Likely secondary to problem #1.  TSH of 0.262.  Free T4 of 1.36.  T3 of 82. Improved with increased dose of Lopressor. Continue current dose of Lopressor 50 mg twice daily.  Will likely need repeat thyroid function studies done in about 4 to 6 weeks post discharge.  3.  Hypertension Blood pressure stable. Patient received doses of IV Lasix. Follow.  4.  Metabolic syndrome/diabetes mellitus type 2, non-insulin-dependent diabetes mellitus Hemoglobin A1c 6.1. CBG of 164 this morning. Steroids likely contributing to hyperglycemia.  Continue to hold oral antihypoglycemic agents.  Continue Lantus to 24 units daily,  meal coverage NovoLog 8 units 3 times daily. Sliding scale insulin.  5.  History of bipolar disorder Continue Latuda. Outpatient follow-up.   6.  Morbid obesity  7. Constipation Improved with sorbitol.  Continue Senokot-S twice daily.     DVT prophylaxis: Lovenox Code Status: Full Family Communication: Updated patient.  No family at bedside. Disposition Plan:  . Patient came from: Home            . Anticipated d/c place: Home . Barriers to d/c OR conditions which need to be met to effect a safe d/c: Home once acute respiratory failure has improved.  Improvement with ambulatory  sats as patient noted to desat into the 70s on ambulation.   Consultants:   PCCM: Dr. Carlis Abbott 08/04/2019  Procedures:   CT angiogram chest 08/03/2019  Chest x-ray 07/31/2019, 08/04/2019  2D echo 08/05/2019    Antimicrobials:   None   Subjective: Patient sitting up on the side of the bed.  Denies chest pain.  Denies shortness of breath.  Feeling  better.   Objective: Vitals:   08/10/19 0945 08/10/19 0949 08/10/19 1108 08/10/19 1134  BP: 121/85  99/78 104/78  Pulse: (!) 120  (!) 112 (!) 115  Resp:   20   Temp:   98.6 F (37 C)   TempSrc:   Oral   SpO2:  96% 98% 99%  Weight:      Height:        Intake/Output Summary (Last 24 hours) at 08/10/2019 1302 Last data filed at 08/10/2019 1100 Gross per 24 hour  Intake 560 ml  Output --  Net 560 ml   Filed Weights   08/08/19 0439 08/09/19 0553 08/10/19 0455  Weight: 131.5 kg 131.1 kg 132 kg    Examination:  General exam: NAD  Respiratory system: Decreased breath sounds in the bases.  Fair air movement.  No wheezing.  Speaking in full sentences.  No use of accessory muscles of respiration Cardiovascular system: Tachycardia.  No murmurs rubs or gallops.  No JVD.  No lower extremity edema.  Gastrointestinal system: Abdomen is soft, nontender, nondistended, positive bowel sounds.  No rebound.  No guarding.   Central nervous system: Alert and oriented. No focal neurological deficits. Extremities: Symmetric 5 x 5 power. Skin: No rashes, lesions or ulcers Psychiatry: Judgement and insight appear fair. Mood & affect flat.     Data Reviewed: I have personally reviewed following labs and imaging studies  CBC: Recent Labs  Lab 08/04/19 0404 08/04/19 0404 08/05/19 0426 08/05/19 0426 08/06/19 0442 08/07/19 0401 08/08/19 0342 08/09/19 0422 08/10/19 0355  WBC 5.7   < > 6.5   < > 12.1* 15.1* 17.3* 16.5* 16.5*  NEUTROABS 4.1  --  5.4  --   --   --   --  14.1*  --   HGB 14.3   < > 15.4*   < > 15.9* 17.0* 17.2* 16.7* 16.7*  HCT 45.2   < > 48.3*   < > 49.5* 51.7* 54.5* 52.5* 52.0*  MCV 93.2   < > 91.7   < > 91.8 90.1 92.5 92.4 91.7  PLT 345   < > 408*   < > 457* 485* 546* 607* 563*   < > = values in this interval not displayed.   Basic Metabolic Panel: Recent Labs  Lab 08/06/19 0442 08/07/19 0401 08/08/19 0342 08/09/19 0422 08/10/19 0355  NA 144 140 137 138 138  K 4.0 3.8  3.8 4.0 3.8  CL 105 103 100 98 97*  CO2 '25 27 25 28 27  '$ GLUCOSE 190* 184* 231* 200* 220*  BUN 17 21* 28* 27* 27*  CREATININE 0.72 0.81 0.85 0.98 0.95  CALCIUM 9.4 8.9 9.0 9.1 9.2  MG  --   --   --  2.8*  --    GFR: Estimated Creatinine Clearance: 100.5 mL/min (by C-G formula based on SCr of 0.95 mg/dL). Liver Function Tests: Recent Labs  Lab 08/04/19 0404 08/05/19 0426  AST 39 34  ALT 44 48*  ALKPHOS 86 85  BILITOT 0.5 0.4  PROT 7.4 7.4  ALBUMIN 3.2* 3.3*   No results for  input(s): LIPASE, AMYLASE in the last 168 hours. No results for input(s): AMMONIA in the last 168 hours. Coagulation Profile: No results for input(s): INR, PROTIME in the last 168 hours. Cardiac Enzymes: No results for input(s): CKTOTAL, CKMB, CKMBINDEX, TROPONINI in the last 168 hours. BNP (last 3 results) No results for input(s): PROBNP in the last 8760 hours. HbA1C: No results for input(s): HGBA1C in the last 72 hours. CBG: Recent Labs  Lab 08/09/19 1316 08/09/19 1646 08/09/19 1834 08/10/19 0759 08/10/19 1104  GLUCAP 300* 250* 224* 164* 173*   Lipid Profile: No results for input(s): CHOL, HDL, LDLCALC, TRIG, CHOLHDL, LDLDIRECT in the last 72 hours. Thyroid Function Tests: No results for input(s): TSH, T4TOTAL, FREET4, T3FREE, THYROIDAB in the last 72 hours. Anemia Panel: Recent Labs    08/08/19 0342 08/10/19 0355  FERRITIN 230 346*   Sepsis Labs: Recent Labs  Lab 08/03/19 1617  PROCALCITON <0.10    Recent Results (from the past 240 hour(s))  Blood Culture (routine x 2)     Status: None   Collection Time: 07/31/19  6:48 PM   Specimen: BLOOD  Result Value Ref Range Status   Specimen Description   Final    BLOOD RIGHT ANTECUBITAL Performed at [facility] [address], [place], [place] [address]    Special Requests   Final    BOTTLES DRAWN AEROBIC AND ANAEROBIC Blood Culture adequate volume Performed at [facility] [address], [place], [place] [address]    Culture   Final    NO GROWTH 5 DAYS Performed at [facility], [place] [address], [place], [place] [address]    Report Status 08/06/2019 FINAL  Final  Blood Culture (routine x 2)     Status: None   Collection Time: 08/01/19  4:20 AM   Specimen: BLOOD RIGHT HAND  Result Value Ref Range Status   Specimen Description   Final    BLOOD RIGHT HAND Performed at [facility] [address], [place], [place] [address]    Special Requests   Final    BOTTLES DRAWN AEROBIC AND ANAEROBIC Blood Culture adequate volume Performed at [facility] [address] [place],  [address]    Culture   Final    NO GROWTH 5 DAYS Performed at [facility], [place] [address], [place],  [address]    Report Status 08/06/2019 FINAL  Final         Radiology Studies: No results found.      Scheduled Meds: . vitamin C  500 mg Oral Daily  . enoxaparin (LOVENOX) injection  70 mg Subcutaneous Q24H  . fluticasone  2 spray Each Nare Daily  . furosemide  20 mg Oral Daily  . gabapentin  100 mg Oral BID  . insulin aspart  0-20 Units Subcutaneous TID WC  . insulin aspart  0-5 Units Subcutaneous QHS  . insulin aspart  8 Units Subcutaneous TID WC  . insulin glargine  24 Units Subcutaneous QHS  . lurasidone  40 mg Oral Daily  . methylPREDNISolone (SOLU-MEDROL) injection  40 mg Intravenous Q8H  . metoprolol tartrate  50 mg Oral BID  . senna-docusate  1 tablet Oral BID  . zinc sulfate  220 mg Oral Daily   Continuous Infusions:   LOS: 10 days    Time spent: 35 minutes    Irine Seal, MD [facility]   To contact the attending provider between 7A-7P or the  covering provider during after hours 7P-7A, please log into the web site [email or web address] and access using universal [place] password for that web site. If you do not have the password, please call the hospital operator.  08/10/2019, 1:02 PM

## 2019-08-10 NOTE — Progress Notes (Signed)
Patient's O2 sat probe came off of left earlobe.  New device placed.  O2 sats 85% on 2L while patient at rest with good wave form.  Patient report no signs of distress, no shortness of breath.  Respirations 24.  O2 increased to 3L and sats increased to 91% while patient at rest.  Will continue to monitor.

## 2019-08-11 LAB — CBC
HCT: 50 % — ABNORMAL HIGH (ref 36.0–46.0)
Hemoglobin: 15.9 g/dL — ABNORMAL HIGH (ref 12.0–15.0)
MCH: 29.5 pg (ref 26.0–34.0)
MCHC: 31.8 g/dL (ref 30.0–36.0)
MCV: 92.8 fL (ref 80.0–100.0)
Platelets: 468 10*3/uL — ABNORMAL HIGH (ref 150–400)
RBC: 5.39 MIL/uL — ABNORMAL HIGH (ref 3.87–5.11)
RDW: 13.6 % (ref 11.5–15.5)
WBC: 15.2 10*3/uL — ABNORMAL HIGH (ref 4.0–10.5)
nRBC: 0 % (ref 0.0–0.2)

## 2019-08-11 LAB — GLUCOSE, CAPILLARY
Glucose-Capillary: 167 mg/dL — ABNORMAL HIGH (ref 70–99)
Glucose-Capillary: 190 mg/dL — ABNORMAL HIGH (ref 70–99)
Glucose-Capillary: 221 mg/dL — ABNORMAL HIGH (ref 70–99)
Glucose-Capillary: 201 mg/dL — ABNORMAL HIGH (ref 70–99)

## 2019-08-11 LAB — BASIC METABOLIC PANEL WITH GFR
Anion gap: 10 (ref 5–15)
BUN: 29 mg/dL — ABNORMAL HIGH (ref 6–20)
CO2: 28 mmol/L (ref 22–32)
Calcium: 8.9 mg/dL (ref 8.9–10.3)
Chloride: 98 mmol/L (ref 98–111)
Creatinine, Ser: 0.89 mg/dL (ref 0.44–1.00)
GFR calc Af Amer: >60 mL/min
GFR calc non Af Amer: >60 mL/min
Glucose, Bld: 214 mg/dL — ABNORMAL HIGH (ref 70–99)
Potassium: 4.1 mmol/L (ref 3.5–5.1)
Sodium: 136 mmol/L (ref 135–145)

## 2019-08-11 NOTE — Progress Notes (Signed)
PROGRESS NOTE    Christie Walker  QAS:[id number] DOB: [birth year] DOA: 07/31/2019 PCP: Vevelyn Francois, NP    Brief Narrative:  Christie Walker is a61 y.o.F with BMI 50, Bipolar in remission, DM and HTN who presented with 1 week fever, chills, cough, chest tightness/dyspnea and SOB with exertion.  In the ER, temp 100.72F, tachypneic, O2 sat 84% on room air. CXR with bilateral opacities. COVID+ on 3/16.    Assessment & Plan:   Principal Problem:   Pneumonia due to COVID-19 virus Active Problems:   HTN (hypertension)   Acute respiratory failure with hypoxia (HCC)   SIRS (systemic inflammatory response syndrome) (HCC)   Chest tightness   Bipolar disorder (HCC)   Type 2 diabetes mellitus without complication, without long-term current use of insulin (HCC)   Obesity, Class III, BMI 40-49.9 (morbid obesity) ([place])  1 acute hypoxic respiratory failure secondary to acute COPD 19 viral pneumonia during ongoing COVID-19 pandemic, POA Patient had presented with fever, tachypnea, hypoxia.  Chest x-ray showed bilateral opacities.  COVID-19 positive.  Is felt that patient's respiratory failure likely also worsened due to obesity hypoventilation syndrome.  CT angiogram chest negative for PE however showed extensive groundglass opacities scattered throughout both lungs.  Chest x-ray on 08/04/2019 with bilateral airspace disease more severe on the left.  2D echo done on 08/05/2019 with a EF of 65 to 70%, moderate LVH, indeterminate diastolic parameters.  Current weight of 289.9 pounds from 291.01 pounds from 289.1 pounds from 289.9 pounds from 290.13 pounds from 315 pounds.  Patient with a urine output not recorded over the past 24 hours. Patient is - 2.130 L during this hospitalization.  Inflammatory markers trending down.  Patient still hypoxic on 2 L high flow O2 however noted to desat into the 70s on ambulation.  Patient status post full course of IV steroids, full course of IV remdesivir which was  completed 08/04/2019.  Patient received Actemra on 08/01/2019.  Continue IV Solu-Medrol to every 12 hours and taper to daily tomorrow.  Status post IV Lasix.  Currently on home dose oral Lasix. Patient seen by PCCM who feel no role of antibiotics at this time, intubation should be based on increased work of breathing, keep sats greater than 85%.  Supportive care.  2.  Sinus tachycardia Likely secondary to problem #1.  TSH of 0.262.  Free T4 of 1.36.  T3 of 82. Improved with increased dose of Lopressor. Continue current dose of Lopressor 50 mg twice daily.  Will likely need repeat thyroid function studies done in about 4 to 6 weeks post discharge.  3.  Hypertension Blood pressure stable.  Was on IV Lasix and has been transitioned back to home dose oral Lasix.  On Lopressor.  4.  Metabolic syndrome/diabetes mellitus type 2, non-insulin-dependent diabetes mellitus Hemoglobin A1c 6.1. CBG of 167 this morning. Steroids likely contributing to hyperglycemia.  Continue to hold oral antihypoglycemic agents.  Continue Lantus to 24 units daily,  meal coverage NovoLog 8 units 3 times daily. Sliding scale insulin.  5.  History of bipolar disorder Continue Latuda. Outpatient follow-up.   6.  Morbid obesity  7. Constipation Resolved with sorbitol.  Continue Senokot-S twice daily.    DVT prophylaxis: Lovenox Code Status: Full Family Communication: Updated patient.  No family at bedside. Disposition Plan:  . Patient came from: Home            . Anticipated d/c place: Home . Barriers to d/c OR conditions which need to be met  to effect a safe d/c: Home once acute respiratory failure has improved.  Improvement with ambulatory sats as patient noted to desat into the 70s on ambulation and when transitioning to oral steroids..   Consultants:   PCCM: Dr. Carlis Abbott 08/04/2019  Procedures:   CT angiogram chest 08/03/2019  Chest x-ray 07/31/2019, 08/04/2019  2D echo 08/05/2019    Antimicrobials:    None   Subjective: Patient laying in bed.  Denies any chest pain or shortness of breath.  Objective: Vitals:   08/11/19 0439 08/11/19 0800 08/11/19 1009 08/11/19 1136  BP: (!) 148/93     Pulse: 96   93  Resp:      Temp: 98.4 F (36.9 C)   98.6 F (37 C)  TempSrc: Oral   Oral  SpO2: (!) 89% 98% 96% 93%  Weight: 131.5 kg     Height:        Intake/Output Summary (Last 24 hours) at 08/11/2019 1137 Last data filed at 08/10/2019 1639 Gross per 24 hour  Intake 240 ml  Output --  Net 240 ml   Filed Weights   08/09/19 0553 08/10/19 0455 08/11/19 0439  Weight: 131.1 kg 132 kg 131.5 kg    Examination:  General exam: NAD  Respiratory system: Decreased breath sounds in the bases otherwise clear.  Fair air movement.  No wheezing.  No use of accessory muscles of respiration.   Cardiovascular system: Tachycardia.  No JVD.  No lower extremity edema.  No murmurs rubs or gallops.  Gastrointestinal system: Abdomen is nontender, nondistended, soft, positive bowel sounds.  No rebound.  No guarding.  Central nervous system: Alert and oriented. No focal neurological deficits. Extremities: Symmetric 5 x 5 power. Skin: No rashes, lesions or ulcers Psychiatry: Judgement and insight appear fair. Mood & affect flat.     Data Reviewed: I have personally reviewed following labs and imaging studies  CBC: Recent Labs  Lab 08/05/19 0426 08/06/19 0442 08/07/19 0401 08/08/19 0342 08/09/19 0422 08/10/19 0355 08/11/19 0415  WBC 6.5   < > 15.1* 17.3* 16.5* 16.5* 15.2*  NEUTROABS 5.4  --   --   --  14.1*  --   --   HGB 15.4*   < > 17.0* 17.2* 16.7* 16.7* 15.9*  HCT 48.3*   < > 51.7* 54.5* 52.5* 52.0* 50.0*  MCV 91.7   < > 90.1 92.5 92.4 91.7 92.8  PLT 408*   < > 485* 546* 607* 563* 468*   < > = values in this interval not displayed.   Basic Metabolic Panel: Recent Labs  Lab 08/07/19 0401 08/08/19 0342 08/09/19 0422 08/10/19 0355 08/11/19 0415  NA 140 137 138 138 136  K 3.8 3.8 4.0  3.8 4.1  CL 103 100 98 97* 98  CO2 _0 GLUCOSE 184* 231* 200* 220* 214*  BUN 21* 28* 27* 27* 29*  CREATININE 0.81 0.85 0.98 0.95 0.89  CALCIUM 8.9 9.0 9.1 9.2 8.9  MG  --   --  2.8*  --   --    GFR: Estimated Creatinine Clearance: 107.1 mL/min (by C-G formula based on SCr of 0.89 mg/dL). Liver Function Tests: Recent Labs  Lab 08/05/19 0426  AST 34  ALT 48*  ALKPHOS 85  BILITOT 0.4  PROT 7.4  ALBUMIN 3.3*   No results for input(s): LIPASE, AMYLASE in the last 168 hours. No results for input(s): AMMONIA in the last 168 hours. Coagulation Profile: No results for input(s): INR, PROTIME in  the last 168 hours. Cardiac Enzymes: No results for input(s): CKTOTAL, CKMB, CKMBINDEX, TROPONINI in the last 168 hours. BNP (last 3 results) No results for input(s): PROBNP in the last 8760 hours. HbA1C: No results for input(s): HGBA1C in the last 72 hours. CBG: Recent Labs  Lab 08/10/19 1104 08/10/19 1636 08/10/19 2050 08/11/19 0749 08/11/19 1134  GLUCAP 173* 207* 231* 167* 190*   Lipid Profile: No results for input(s): CHOL, HDL, LDLCALC, TRIG, CHOLHDL, LDLDIRECT in the last 72 hours. Thyroid Function Tests: No results for input(s): TSH, T4TOTAL, FREET4, T3FREE, THYROIDAB in the last 72 hours. Anemia Panel: Recent Labs    08/10/19 0355  FERRITIN 346*   Sepsis Labs: No results for input(s): PROCALCITON, LATICACIDVEN in the last 168 hours.  No results found for this or any previous visit (from the past 240 hour(s)).       Radiology Studies: No results found.      Scheduled Meds: . vitamin C  500 mg Oral Daily  . enoxaparin (LOVENOX) injection  70 mg Subcutaneous Q24H  . fluticasone  2 spray Each Nare Daily  . furosemide  20 mg Oral Daily  . gabapentin  100 mg Oral BID  . insulin aspart  0-20 Units Subcutaneous TID WC  . insulin aspart  0-5 Units Subcutaneous QHS  . insulin aspart  8 Units Subcutaneous TID WC  . insulin glargine  24 Units  Subcutaneous QHS  . lurasidone  40 mg Oral Daily  . methylPREDNISolone (SOLU-MEDROL) injection  40 mg Intravenous Q12H  . metoprolol tartrate  50 mg Oral BID  . senna-docusate  1 tablet Oral BID  . zinc sulfate  220 mg Oral Daily   Continuous Infusions:   LOS: 11 days    Time spent: 35 minutes    Irine Seal, MD [facility]   To contact the attending provider between 7A-7P or the covering provider during after hours 7P-7A, please log into the web site [email or web address] and access using universal [place] password for that web site. If you do not have the password, please call the hospital operator.  08/11/2019, 11:37 AM

## 2019-08-11 NOTE — Progress Notes (Signed)
Patient ambulated 5 laps around her room on RA. O2 Sats dropped to 89% RA briefly but quickly increased to 93-94% RA. Pt tolerated walk well- denied SOB or lightheadedness, did not want to quit walking. Currently sitting on edge of bed, O2 Sats 95% RA. Will continue to monitor.

## 2019-08-11 NOTE — Plan of Care (Signed)
  Problem: Education: Goal: Knowledge of risk factors and measures for prevention of condition will improve Outcome: Progressing   Problem: Coping: Goal: Psychosocial and spiritual needs will be supported Outcome: Progressing   Problem: Respiratory: Goal: Will maintain a patent airway Outcome: Progressing Goal: Complications related to the disease process, condition or treatment will be avoided or minimized Outcome: Progressing   

## 2019-08-12 LAB — BASIC METABOLIC PANEL
Anion gap: 12 (ref 5–15)
BUN: 30 mg/dL — ABNORMAL HIGH (ref 6–20)
CO2: 24 mmol/L (ref 22–32)
Calcium: 9.1 mg/dL (ref 8.9–10.3)
Chloride: 100 mmol/L (ref 98–111)
Creatinine, Ser: 0.87 mg/dL (ref 0.44–1.00)
GFR calc Af Amer: >60 mL/min (ref >60)
GFR calc non Af Amer: >60 mL/min (ref >60)
Glucose, Bld: 221 mg/dL — ABNORMAL HIGH (ref 70–99)
Potassium: 4.2 mmol/L (ref 3.5–5.1)
Sodium: 136 mmol/L (ref 135–145)

## 2019-08-12 LAB — CBC
HCT: 50.2 % — ABNORMAL HIGH (ref 36.0–46.0)
Hemoglobin: 16.3 g/dL — ABNORMAL HIGH (ref 12.0–15.0)
MCH: 29.5 pg (ref 26.0–34.0)
MCHC: 32.5 g/dL (ref 30.0–36.0)
MCV: 90.8 fL (ref 80.0–100.0)
Platelets: 459 10*3/uL — ABNORMAL HIGH (ref 150–400)
RBC: 5.53 MIL/uL — ABNORMAL HIGH (ref 3.87–5.11)
RDW: 13.6 % (ref 11.5–15.5)
WBC: 15.2 10*3/uL — ABNORMAL HIGH (ref 4.0–10.5)
nRBC: 0 % (ref 0.0–0.2)

## 2019-08-12 LAB — GLUCOSE, CAPILLARY
Glucose-Capillary: 179 mg/dL — ABNORMAL HIGH (ref 70–99)
Glucose-Capillary: 175 mg/dL — ABNORMAL HIGH (ref 70–99)
Glucose-Capillary: 227 mg/dL — ABNORMAL HIGH (ref 70–99)
Glucose-Capillary: 184 mg/dL — ABNORMAL HIGH (ref 70–99)

## 2019-08-12 LAB — MAGNESIUM: Magnesium: 2.6 mg/dL — ABNORMAL HIGH (ref 1.7–2.4)

## 2019-08-12 MED ORDER — METHYLPREDNISOLONE SODIUM SUCC 40 MG IJ SOLR
40.0000 mg | Freq: Every day | INTRAMUSCULAR | Status: DC
  Administered 2019-08-13: 40 mg via INTRAVENOUS
  Filled 2019-08-12: qty 1

## 2019-08-12 NOTE — Progress Notes (Signed)
Nutrition Brief Note RD working remotely.   Patient identified for LOS (day #12)  Wt Readings from Last 15 Encounters:  08/12/19 132.6 kg  06/14/19 (!) 142.4 kg  12/14/18 (!) 136.5 kg  06/15/18 (!) 136.5 kg  12/08/17 132 kg  05/04/17 (!) 138.8 kg  01/26/17 (!) 146.1 kg  11/03/16 (!) 155.1 kg  10/20/16 (!) 155.1 kg  09/15/16 (!) 159.2 kg  08/04/16 (!) 156.5 kg  08/01/16 (!) 156.9 kg  06/14/16 (!) 156.9 kg  12/21/15 (!) 149.7 kg  10/22/15 (!) 149.7 kg    Body mass index is 48.66 kg/m. Patient meets criteria for morbid obesity based on current BMI. Weight today is 292 lb and weight on 2/5 was 313 lb. This indicates 21 lb weight loss (6.7% body weight) in the past 2 months; not significant for time frame.   Patient is COVID-19 positive.  Current diet order is Heart Healthy and she has been consuming 100% of meals. Labs and medications reviewed; 20 mg oral lasix/day.    No nutrition interventions warranted at this time. If nutrition issues arise, please consult RD.      Trenton Gammon, MS, RD, LDN, CNSC Inpatient Clinical Dietitian RD pager # available in [facility]  After hours/weekend pager # available in [facility]

## 2019-08-12 NOTE — Progress Notes (Addendum)
PROGRESS NOTE    Christie Walker  BLT:[id number] DOB: 1970/08/26 DOA: 07/31/2019 PCP: Vevelyn Francois, NP    Brief Narrative:  Christie Walker is a45 y.o.F with BMI 50, Bipolar in remission, DM and HTN who presented with 1 week fever, chills, cough, chest tightness/dyspnea and SOB with exertion.  In the ER, temp 100.64F, tachypneic, O2 sat 84% on room air. CXR with bilateral opacities. COVID+ on 3/16.    Assessment & Plan:   Principal Problem:   Pneumonia due to COVID-19 virus Active Problems:   HTN (hypertension)   Acute respiratory failure with hypoxia (HCC)   SIRS (systemic inflammatory response syndrome) (HCC)   Chest tightness   Bipolar disorder (HCC)   Type 2 diabetes mellitus without complication, without long-term current use of insulin (HCC)   Obesity, Class III, BMI 40-49.9 (morbid obesity) ([place])  1 acute hypoxic respiratory failure secondary to acute COPD 19 viral pneumonia during ongoing COVID-19 pandemic, POA Patient had presented with fever, tachypnea, hypoxia.  Chest x-ray showed bilateral opacities.  COVID-19 positive.  Is felt that patient's respiratory failure likely also worsened due to obesity hypoventilation syndrome.  CT angiogram chest negative for PE however showed extensive groundglass opacities scattered throughout both lungs.  Chest x-ray on 08/04/2019 with bilateral airspace disease more severe on the left.  2D echo done on 08/05/2019 with a EF of 65 to 70%, moderate LVH, indeterminate diastolic parameters.  Current weight of 292.4 pounds from 289.9 pounds from 291.01 pounds from 289.1 pounds from 289.9 pounds from 290.13 pounds from 315 pounds.  Patient with a urine output not recorded over the past 24 hours. Inflammatory markers trending down.  Patient with improvement with hypoxia with sats ranging from 90 to 98% on room air to 2 L nasal cannula.  Early on in the hospitalization patient noted to desat in the 70s on ambulation. Patient status post full  course of IV steroids, full course of IV remdesivir which was completed 08/04/2019.  Patient received Actemra on 08/01/2019.  Decrease IV Solu-Medrol to 40 mg daily and likely transition to oral prednisone taper tomorrow. Status post IV Lasix.  Currently on home dose oral Lasix. Patient seen by PCCM who feel no role of antibiotics at this time, intubation should be based on increased work of breathing, keep sats greater than 85%.  Check ambulatory sats.  Supportive care.  2.  Sinus tachycardia Likely secondary to problem #1.  TSH of 0.262.  Free T4 of 1.36.  T3 of 82. Improved with increased dose of Lopressor. Continue current dose of Lopressor 50 mg twice daily.  Will likely need repeat thyroid function studies done in about 4 to 6 weeks post discharge.  3.  Hypertension Blood pressure stable.  Continue home dose oral Lasix and Lopressor.   4.  Metabolic syndrome/diabetes mellitus type 2, non-insulin-dependent diabetes mellitus Hemoglobin A1c 6.1. CBG of 179 this morning. Steroids likely contributing to hyperglycemia.  Continue to hold oral antihypoglycemic agents.  Continue Lantus to 24 units daily,  meal coverage NovoLog 8 units 3 times daily. Sliding scale insulin.  5.  History of bipolar disorder Stable.  Continue Latuda.  Outpatient follow-up.   6.  Morbid obesity  7. Constipation Resolved with sorbitol.  Continue Senokot-S twice daily.    DVT prophylaxis: Lovenox Code Status: Full Family Communication: Updated patient.  No family at bedside. Disposition Plan:  . Patient came from: Home            . Anticipated d/c place: Home .  Barriers to d/c OR conditions which need to be met to effect a safe d/c: Home once acute respiratory failure has improved.  Improvement with ambulatory sats as patient noted to desat into the 70s on ambulation and when transitioning to oral steroids..   Consultants:   PCCM: Dr. Carlis Abbott 08/04/2019  Procedures:   CT angiogram chest 08/03/2019  Chest x-ray  07/31/2019, 08/04/2019  2D echo 08/05/2019    Antimicrobials:   None   Subjective: Patient laying in bed.  Denies chest pain or shortness of breath.  Denies any dizziness or lightheadedness.  Feeling much better.   Objective: Vitals:   08/12/19 0528 08/12/19 0600 08/12/19 0619 08/12/19 1124  BP: 136/89   130/79  Pulse: (!) 110   95  Resp:  (!) 32  (!) 27  Temp: 98.2 F (36.8 C)   98.6 F (37 C)  TempSrc: Oral   Oral  SpO2: 90%  98% 92%  Weight: 132.6 kg     Height:        Intake/Output Summary (Last 24 hours) at 08/12/2019 1229 Last data filed at 08/12/2019 1100 Gross per 24 hour  Intake 960 ml  Output --  Net 960 ml   Filed Weights   08/10/19 0455 08/11/19 0439 08/12/19 0528  Weight: 132 kg 131.5 kg 132.6 kg    Examination:  General exam: NAD  Respiratory system: Some decreased breath sounds in the bases otherwise clear to auscultation bilaterally.  Fair air movement.  No wheezing.  No use of accessory muscles of respiration.   Cardiovascular system: Tachycardia.  No JVD.  No murmurs rubs or gallops.  No lower extremity edema.   Gastrointestinal system: Abdomen is soft, nontender, nondistended, positive bowel sounds.  No rebound.  No guarding.   Central nervous system: Alert and oriented. No focal neurological deficits. Extremities: Symmetric 5 x 5 power. Skin: No rashes, lesions or ulcers Psychiatry: Judgement and insight appear fair. Mood & affect flat.     Data Reviewed: I have personally reviewed following labs and imaging studies  CBC: Recent Labs  Lab 08/08/19 0342 08/09/19 0422 08/10/19 0355 08/11/19 0415 08/12/19 0444  WBC 17.3* 16.5* 16.5* 15.2* 15.2*  NEUTROABS  --  14.1*  --   --   --   HGB 17.2* 16.7* 16.7* 15.9* 16.3*  HCT 54.5* 52.5* 52.0* 50.0* 50.2*  MCV 92.5 92.4 91.7 92.8 90.8  PLT 546* 607* 563* 468* 812*   Basic Metabolic Panel: Recent Labs  Lab 08/08/19 0342 08/09/19 0422 08/10/19 0355 08/11/19 0415 08/12/19 0444  NA 137  138 138 136 136  K 3.8 4.0 3.8 4.1 4.2  CL 100 98 97* 98 100  CO2 _0 GLUCOSE 231* 200* 220* 214* 221*  BUN 28* 27* 27* 29* 30*  CREATININE 0.85 0.98 0.95 0.89 0.87  CALCIUM 9.0 9.1 9.2 8.9 9.1  MG  --  2.8*  --   --  2.6*   GFR: Estimated Creatinine Clearance: 110 mL/min (by C-G formula based on SCr of 0.87 mg/dL). Liver Function Tests: No results for input(s): AST, ALT, ALKPHOS, BILITOT, PROT, ALBUMIN in the last 168 hours. No results for input(s): LIPASE, AMYLASE in the last 168 hours. No results for input(s): AMMONIA in the last 168 hours. Coagulation Profile: No results for input(s): INR, PROTIME in the last 168 hours. Cardiac Enzymes: No results for input(s): CKTOTAL, CKMB, CKMBINDEX, TROPONINI in the last 168 hours. BNP (last 3 results) No results for input(s): PROBNP in the  last 8760 hours. HbA1C: No results for input(s): HGBA1C in the last 72 hours. CBG: Recent Labs  Lab 08/11/19 1134 08/11/19 1713 08/11/19 2202 08/12/19 0747 08/12/19 1123  GLUCAP 190* 221* 201* 179* 175*   Lipid Profile: No results for input(s): CHOL, HDL, LDLCALC, TRIG, CHOLHDL, LDLDIRECT in the last 72 hours. Thyroid Function Tests: No results for input(s): TSH, T4TOTAL, FREET4, T3FREE, THYROIDAB in the last 72 hours. Anemia Panel: Recent Labs    08/10/19 0355  FERRITIN 346*   Sepsis Labs: No results for input(s): PROCALCITON, LATICACIDVEN in the last 168 hours.  No results found for this or any previous visit (from the past 240 hour(s)).       Radiology Studies: No results found.      Scheduled Meds: . vitamin C  500 mg Oral Daily  . enoxaparin (LOVENOX) injection  70 mg Subcutaneous Q24H  . fluticasone  2 spray Each Nare Daily  . furosemide  20 mg Oral Daily  . gabapentin  100 mg Oral BID  . insulin aspart  0-20 Units Subcutaneous TID WC  . insulin aspart  0-5 Units Subcutaneous QHS  . insulin aspart  8 Units Subcutaneous TID WC  . insulin glargine  24  Units Subcutaneous QHS  . lurasidone  40 mg Oral Daily  . [START ON 08/13/2019] methylPREDNISolone (SOLU-MEDROL) injection  40 mg Intravenous Daily  . metoprolol tartrate  50 mg Oral BID  . senna-docusate  1 tablet Oral BID  . zinc sulfate  220 mg Oral Daily   Continuous Infusions:   LOS: 12 days    Time spent: 35 minutes    Irine Seal, MD [facility]   To contact the attending provider between 7A-7P or the covering provider during after hours 7P-7A, please log into the web site [email or web address] and access using universal [place] password for that web site. If you do not have the password, please call the hospital operator.  08/12/2019, 12:29 PM

## 2019-08-12 NOTE — Progress Notes (Signed)
Received report from Mandy, RN. Agree with previous assessment. Pt laying in bed, VSS, in no apparent distress. Will continue to monitor 

## 2019-08-13 LAB — BASIC METABOLIC PANEL
Anion gap: 10 (ref 5–15)
BUN: 30 mg/dL — ABNORMAL HIGH (ref 6–20)
CO2: 26 mmol/L (ref 22–32)
Calcium: 8.6 mg/dL — ABNORMAL LOW (ref 8.9–10.3)
Chloride: 101 mmol/L (ref 98–111)
Creatinine, Ser: 0.83 mg/dL (ref 0.44–1.00)
GFR calc Af Amer: >60 mL/min (ref >60)
GFR calc non Af Amer: >60 mL/min (ref >60)
Glucose, Bld: 131 mg/dL — ABNORMAL HIGH (ref 70–99)
Potassium: 3.9 mmol/L (ref 3.5–5.1)
Sodium: 137 mmol/L (ref 135–145)

## 2019-08-13 LAB — GLUCOSE, CAPILLARY
Glucose-Capillary: 98 mg/dL (ref 70–99)
Glucose-Capillary: 165 mg/dL — ABNORMAL HIGH (ref 70–99)
Glucose-Capillary: 165 mg/dL — ABNORMAL HIGH (ref 70–99)

## 2019-08-13 LAB — CBC
HCT: 46.5 % — ABNORMAL HIGH (ref 36.0–46.0)
Hemoglobin: 14.9 g/dL (ref 12.0–15.0)
MCH: 29.6 pg (ref 26.0–34.0)
MCHC: 32 g/dL (ref 30.0–36.0)
MCV: 92.3 fL (ref 80.0–100.0)
Platelets: 363 10*3/uL (ref 150–400)
RBC: 5.04 MIL/uL (ref 3.87–5.11)
RDW: 13.8 % (ref 11.5–15.5)
WBC: 13.7 10*3/uL — ABNORMAL HIGH (ref 4.0–10.5)
nRBC: 0 % (ref 0.0–0.2)

## 2019-08-13 MED ORDER — METOPROLOL TARTRATE 50 MG PO TABS: 50.0000 mg | ORAL_TABLET | Freq: Two times a day (BID) | ORAL | 1 refills | Status: DC

## 2019-08-13 MED ORDER — SENNOSIDES-DOCUSATE SODIUM 8.6-50 MG PO TABS: 1.0000 | ORAL_TABLET | Freq: Two times a day (BID) | ORAL | Status: DC

## 2019-08-13 MED ORDER — FLUTICASONE PROPIONATE 50 MCG/ACT NA SUSP: 2.0000 | Freq: Every day | NASAL | 0 refills | Status: DC

## 2019-08-13 MED ORDER — PREDNISONE 20 MG PO TABS: 40.0000 mg | ORAL_TABLET | Freq: Every day | ORAL | 0 refills | Status: DC

## 2019-08-13 MED ORDER — ENOXAPARIN SODIUM 80 MG/0.8ML ~~LOC~~ SOLN: 65.0000 mg | SUBCUTANEOUS | Status: DC

## 2019-08-13 NOTE — TOC Progression Note (Signed)
Transition of Care [facility]) - Progression Note    Patient Details  Name: Christie Walker MRN: [id number] Date of Birth: 07-14-1970  [facility]) CM/SW Contact  Geni Bers, RN Phone Number: 08/13/2019, 10:19 AM  Clinical Narrative:    Pt states that she will not need HH at discharge.    Expected Discharge Plan: Home/Self Care Barriers to Discharge: No Barriers Identified  Expected Discharge Plan and Services Expected Discharge Plan: Home/Self Care       Living arrangements for the past 2 months: Single Family Home                                       Social Determinants of Health (SDOH) Interventions    Readmission Risk Interventions No flowsheet data found.

## 2019-08-13 NOTE — TOC Progression Note (Signed)
Transition of Care [facility] - Progression Note    Patient Details  Name: Christie Walker MRN: [id number] Date of Birth: April 09, 1971  Transition of Care [facility]) CM/SW Contact  Geni Bers, RN Phone Number: 08/13/2019, 2:50 PM  Clinical Narrative:    Pt refuse to pay co pay of $70.00 for Home O2.    Expected Discharge Plan: Home/Self Care Barriers to Discharge: No Barriers Identified  Expected Discharge Plan and Services Expected Discharge Plan: Home/Self Care       Living arrangements for the past 2 months: Single Family Home                                       Social Determinants of Health (SDOH) Interventions    Readmission Risk Interventions No flowsheet data found.

## 2019-08-13 NOTE — Progress Notes (Signed)
Occupational Therapy Treatment Patient Details Name: Christie Walker MRN: [id number] DOB: 12/22/1970 Today's Date: 08/13/2019    History of present illness Christie Walker is a 49 y.o. F with BMI 50, Bipolar in remission, DM and HTN who presented3/24/21  with 1 week fever, chills, cough, chest tightness/dyspnea and SOB with exertion.  In the ER, temp 100.74F, tachypneic, O2 sat 84% on room air.  CXR with bilateral opacities.  COVID+ on 3/16.   OT comments  Pt overall S with ADL activity this OT visit.  Pt on room air and oxygen 90 during ADL activty with HR 100-119  Follow Up Recommendations  No OT follow up;Supervision - Intermittent    Equipment Recommendations  3 in 1 bedside commode    Recommendations for Other Services      Precautions / Restrictions Precautions Precautions: Fall       Mobility Bed Mobility Overal bed mobility: Modified Independent                Transfers Overall transfer level: Needs assistance   Transfers: Sit to/from Stand;Stand Pivot Transfers Sit to Stand: Supervision Stand pivot transfers: Min guard            Balance Overall balance assessment: Mild deficits observed, not formally tested                                         ADL either performed or assessed with clinical judgement   ADL Overall ADL's : Needs assistance/impaired Eating/Feeding: Set up;Sitting   Grooming: Wash/dry hands;Wash/dry face;Standing;Supervision/safety                   Toilet Transfer: Min guard;Stand-pivot;Cueing for safety;Ambulation;Regular Museum/gallery exhibitions officer and Hygiene: Min guard;Sit to/from stand       Functional mobility during ADLs: Supervision/safety                 Cognition Arousal/Alertness: Awake/alert Behavior During Therapy: WFL for tasks assessed/performed Overall Cognitive Status: Within Functional Limits for tasks assessed                                                      Pertinent Vitals/ Pain       Pain Assessment: No/denies pain         Frequency  Min 3X/week        Progress Toward Goals  OT Goals(current goals can now be found in the care plan section)  Progress towards OT goals: Progressing toward goals     Plan Discharge plan remains appropriate       AM-PAC OT "6 Clicks" Daily Activity     Outcome Measure   Help from another person eating meals?: None Help from another person taking care of personal grooming?: A Little Help from another person toileting, which includes using toliet, bedpan, or urinal?: A Little Help from another person bathing (including washing, rinsing, drying)?: A Little Help from another person to put on and taking off regular upper body clothing?: A Little Help from another person to put on and taking off regular lower body clothing?: A Little 6 Click Score: 19    End of Session Equipment Utilized During Treatment: (2L; increased to 3L for mobility)  OT Visit  Diagnosis: Unsteadiness on feet (R26.81);Muscle weakness (generalized) (M62.81)   Activity Tolerance Patient tolerated treatment well   Patient Left in chair;with call bell/phone within reach;with chair alarm set   Nurse Communication Mobility status        Time: 4536-4680 OT Time Calculation (min): 13 min  Charges: OT General Charges $OT Visit: 1 Visit OT Treatments $Self Care/Home Management : 8-22 mins  Lise Auer, OT Acute Rehabilitation Services [phone] Office- [phone]      Wladyslaw Henrichs, Karin Golden D 08/13/2019, 1:44 PM

## 2019-08-13 NOTE — Progress Notes (Signed)
SATURATION QUALIFICATIONS: (This note is used to comply with regulatory documentation for home oxygen)  Patient Saturations on Room Air at Rest = 95%  Patient Saturations on Room Air while Ambulating = 88%   Please briefly explain why patient needs home oxygen:

## 2019-08-13 NOTE — Discharge Summary (Signed)
Physician Discharge Summary  Christie Walker UJW:[id number] DOB: 05/07/1971 DOA: 07/31/2019  PCP: Barbette Merino, NP  Admit date: 07/31/2019 Discharge date: 08/13/2019  Time spent: 55 minutes  Recommendations for Outpatient Follow-up:  1. Follow-up with Barbette Merino, NP in 1 to 2 weeks.  On follow-up patient need a basic metabolic profile done to follow-up on electrolytes and renal function.  Patient will need ambulatory sats done to see if patient still needs ongoing home O2.  Patient will need repeat thyroid function studies done in about 4 to 6 weeks.   Discharge Diagnoses:  Principal Problem:   Pneumonia due to COVID-19 virus Active Problems:   HTN (hypertension)   Acute respiratory failure with hypoxia (HCC)   SIRS (systemic inflammatory response syndrome) (HCC)   Chest tightness   Bipolar disorder (HCC)   Type 2 diabetes mellitus without complication, without long-term current use of insulin (HCC)   Obesity, Class III, BMI 40-49.9 (morbid obesity) (HCC)   Discharge Condition: Stable and improved.  Diet recommendation: Carb modified diet  Filed Weights   08/11/19 0439 08/12/19 0528 08/13/19 0531  Weight: 131.5 kg 132.6 kg 132.8 kg    History of present illness:  HPI per Dr. Lovie Chol Mizani Walker is a 49 y.o. female with medical history significant of hypertension, bipolar disorder, GERD presented to the ED with complaints of shortness of breath and chest tightness.  Patient tested positive for COVID-19 on 07/23/2019.  Patient stated her symptoms started on 3/15.  She was having fevers, chills, cough, and shortness of breath.  Shortness of breath was worse with exertion and she experienced substernal chest tightness.  Also having diarrhea.  No abdominal pain or vomiting.  ED Course: Temperature 100.8 F.  Slightly tachycardic.  Tachypneic.  Oxygen saturation 84% on room air, improved with 2 L supplemental oxygen.  Labs showing no leukocytosis.  Lactic acid  normal.  Blood culture x2 pending.  Procalcitonin <0.10. D-dimer 0.8, LDH 305, ferritin 318, fibrinogen 519, and CRP 10.2.  EKG without acute ischemic changes.  Chest x-ray showing hazy, left greater than right bilateral airspace opacities consistent with COVID-19 multifocal pneumonia.   Patient received Tylenol.  Hospital Course:  1 acute hypoxic respiratory failure secondary to acute COPD 19 viral pneumonia during ongoing COVID-19 pandemic, POA Patient had presented with fever, tachypnea, hypoxia.  Chest x-ray showed bilateral opacities.  COVID-19 positive.  Is felt that patient's respiratory failure likely also worsened due to obesity hypoventilation syndrome.  CT angiogram chest negative for PE however showed extensive groundglass opacities scattered throughout both lungs.  Chest x-ray on 08/04/2019 with bilateral airspace disease more severe on the left.  2D echo done on 08/05/2019 with a EF of 65 to 70%, moderate LVH, indeterminate diastolic parameters.  Patient was placed on a few doses of IV Lasix.  Patient placed on IV Solu-Medrol, completed full course of IV remdesivir which was completed on 08/04/2019.  Patient also received Actemra on 08/01/2019.  Patient improved clinically and subsequently transition from IV Solu-Medrol to oral prednisone taper on discharge.  Patient's hypoxia also improved during the hospitalization.  Patient be discharged home on 2 L oxygen and is to follow-up with PCP in the outpatient setting.  Patient was discharged home in stable and improved condition.   2.  Sinus tachycardia Likely secondary to problem #1.  TSH of 0.262.  Free T4 of 1.36.  T3 of 82.  Patient placed on a beta-blocker and dose uptitrated for better rate control.  Outpatient follow-up  with PCP.  Will need repeat thyroid function studies done in about 4 to 6 weeks post discharge.    3.  Hypertension Remained stable during the hospitalization.  Patient was started on Lopressor for tachycardia and  placed back on home regimen of Lasix.  Outpatient follow-up.   4.  Metabolic syndrome/diabetes mellitus type 2, non-insulin-dependent diabetes mellitus Hemoglobin A1c 6.1.  Patient noted to have hypoglycemic episodes during the hospitalization secondary to steroids that patient was placed on for COVID-19.  Patient was placed on Lantus as well as meal coverage NovoLog and sliding scale insulin.  Blood glucose levels improved.  Steroids were tapered down.  Patient be discharged back on home regimen of Metformin.  Outpatient follow-up with PCP.   5.  History of bipolar disorder Patient maintained on home regimen of Latuda.  Outpatient follow-up with PCP.    6.  Morbid obesity  7. Constipation Resolved with sorbitol.    Patient was maintained on Senokot-S twice daily.    Procedures:  CT angiogram chest 08/03/2019  Chest x-ray 07/31/2019, 08/04/2019  2D echo 08/05/2019  Consultations:  PCCM: Dr. Carlis Abbott 08/04/2019   Discharge Exam: Vitals:   08/13/19 1145 08/13/19 1414  BP:  111/74  Pulse:  96  Resp:  16  Temp:  97.8 F (36.6 C)  SpO2: (!) 88% 93%    General: NAD Cardiovascular: RRR no murmurs rubs or gallops. Respiratory: Decreased breath sounds in the bases otherwise clear.  No wheezing.  Speaking in full sentences.  Discharge Instructions   Discharge Instructions    Diet Carb Modified   Complete by: As directed    Discharge instructions   Complete by: As directed    ?   Person Under Monitoring Name: Christie Walker [facility]  Location: [place] [address]   Infection Prevention Recommendations for Individuals Confirmed to have, or Being Evaluated for, 2019 Novel Coronavirus (COVID-19) Infection Who Receive Care at Home  Individuals who are confirmed to have, or are being evaluated for, COVID-19 should follow the prevention steps below until a healthcare provider or local or state health department says they can return to normal  activities.  Stay home except to get medical care You should restrict activities outside your home, except for getting medical care. Do not go to work, school, or public areas, and do not use public transportation or taxis.  Call ahead before visiting your doctor Before your medical appointment, call the healthcare provider and tell them that you have, or are being evaluated for, COVID-19 infection. This will help the healthcare provider's office take steps to keep other people from getting infected. Ask your healthcare provider to call the local or state health department.  Monitor your symptoms Seek prompt medical attention if your illness is worsening (e.g., difficulty breathing). Before going to your medical appointment, call the healthcare provider and tell them that you have, or are being evaluated for, COVID-19 infection. Ask your healthcare provider to call the local or state health department.  Wear a facemask You should wear a facemask that covers your nose and mouth when you are in the same room with other people and when you visit a healthcare provider. People who live with or visit you should also wear a facemask while they are in the same room with you.  Separate yourself from other people in your home As much as possible, you should stay in a different room from other people in your home. Also, you should use a  separate bathroom, if available.  Avoid sharing household items You should not share dishes, drinking glasses, cups, eating utensils, towels, bedding, or other items with other people in your home. After using these items, you should wash them thoroughly with soap and water.  Cover your coughs and sneezes Cover your mouth and nose with a tissue when you cough or sneeze, or you can cough or sneeze into your sleeve. Throw used tissues in a lined trash can, and immediately wash your hands with soap and water for at least 20 seconds or use an alcohol-based hand  rub.  Wash your [employer] your hands often and thoroughly with soap and water for at least 20 seconds. You can use an alcohol-based hand sanitizer if soap and water are not available and if your hands are not visibly dirty. Avoid touching your eyes, nose, and mouth with unwashed hands.   Prevention Steps for Caregivers and Household Members of Individuals Confirmed to have, or Being Evaluated for, COVID-19 Infection Being Cared for in the Home  If you live with, or provide care at home for, a person confirmed to have, or being evaluated for, COVID-19 infection please follow these guidelines to prevent infection:  Follow healthcare provider's instructions Make sure that you understand and can help the patient follow any healthcare provider instructions for all care.  Provide for the patient's basic needs You should help the patient with basic needs in the home and provide support for getting groceries, prescriptions, and other personal needs.  Monitor the patient's symptoms If they are getting sicker, call his or her medical provider and tell them that the patient has, or is being evaluated for, COVID-19 infection. This will help the healthcare provider's office take steps to keep other people from getting infected. Ask the healthcare provider to call the local or state health department.  Limit the number of people who have contact with the patient If possible, have only one caregiver for the patient. Other household members should stay in another home or place of residence. If this is not possible, they should stay in another room, or be separated from the patient as much as possible. Use a separate bathroom, if available. Restrict visitors who do not have an essential need to be in the home.  Keep older adults, very young children, and other sick people away from the patient Keep older adults, very young children, and those who have compromised immune systems or chronic health  conditions away from the patient. This includes people with chronic heart, lung, or kidney conditions, diabetes, and cancer.  Ensure good ventilation Make sure that shared spaces in the home have good air flow, such as from an air conditioner or an opened window, weather permitting.  Wash your hands often Wash your hands often and thoroughly with soap and water for at least 20 seconds. You can use an alcohol based hand sanitizer if soap and water are not available and if your hands are not visibly dirty. Avoid touching your eyes, nose, and mouth with unwashed hands. Use disposable paper towels to dry your hands. If not available, use dedicated cloth towels and replace them when they become wet.  Wear a facemask and gloves Wear a disposable facemask at all times in the room and gloves when you touch or have contact with the patient's blood, body fluids, and/or secretions or excretions, such as sweat, saliva, sputum, nasal mucus, vomit, urine, or feces.  Ensure the mask fits over your nose and mouth tightly, and  do not touch it during use. Throw out disposable facemasks and gloves after using them. Do not reuse. Wash your hands immediately after removing your facemask and gloves. If your personal clothing becomes contaminated, carefully remove clothing and launder. Wash your hands after handling contaminated clothing. Place all used disposable facemasks, gloves, and other waste in a lined container before disposing them with other household waste. Remove gloves and wash your hands immediately after handling these items.  Do not share dishes, glasses, or other household items with the patient Avoid sharing household items. You should not share dishes, drinking glasses, cups, eating utensils, towels, bedding, or other items with a patient who is confirmed to have, or being evaluated for, COVID-19 infection. After the person uses these items, you should wash them thoroughly with soap and  water.  Wash laundry thoroughly Immediately remove and wash clothes or bedding that have blood, body fluids, and/or secretions or excretions, such as sweat, saliva, sputum, nasal mucus, vomit, urine, or feces, on them. Wear gloves when handling laundry from the patient. Read and follow directions on labels of laundry or clothing items and detergent. In general, wash and dry with the warmest temperatures recommended on the label.  Clean all areas the individual has used often Clean all touchable surfaces, such as counters, tabletops, doorknobs, bathroom fixtures, toilets, phones, keyboards, tablets, and bedside tables, every day. Also, clean any surfaces that may have blood, body fluids, and/or secretions or excretions on them. Wear gloves when cleaning surfaces the patient has come in contact with. Use a diluted bleach solution (e.g., dilute bleach with 1 part bleach and 10 parts water) or a household disinfectant with a label that says EPA-registered for coronaviruses. To make a bleach solution at home, add 1 tablespoon of bleach to 1 quart (4 cups) of water. For a larger supply, add  cup of bleach to 1 gallon (16 cups) of water. Read labels of cleaning products and follow recommendations provided on product labels. Labels contain instructions for safe and effective use of the cleaning product including precautions you should take when applying the product, such as wearing gloves or eye protection and making sure you have good ventilation during use of the product. Remove gloves and wash hands immediately after cleaning.  Monitor yourself for signs and symptoms of illness Caregivers and household members are considered close contacts, should monitor their health, and will be asked to limit movement outside of the home to the extent possible. Follow the monitoring steps for close contacts listed on the symptom monitoring form.   ? If you have additional questions, contact your local health  department or call the epidemiologist on call at [phone] (available 24/7). ? This guidance is subject to change. For the most up-to-date guidance from CDC, please refer to their website: [employer]hu   Increase activity slowly   Complete by: As directed      Allergies as of 08/13/2019      Reactions   Bee Venom Swelling   Swelling at site    Amoxicillin Hives, Other (See Comments)   Patient states that she can take this she just has a sensitivity when its taken along with pain medication   Hydrocodone Hives, Other (See Comments)   Sensitivity when taken along with antibiotics per patient   Insect Extract Allergy Skin Test Other (See Comments)   Spiders - make tongue burn   Propoxyphene N-acetaminophen Nausea And Vomiting, Rash      Medication List    TAKE these medications  acetaminophen 325 MG tablet Commonly known as: TYLENOL Take 650 mg by mouth every 6 (six) hours as needed for mild pain or headache.   albuterol 108 (90 Base) MCG/ACT inhaler Commonly known as: VENTOLIN HFA Inhale 2 puffs into the lungs every 6 (six) hours as needed for wheezing or shortness of breath.   fluticasone 50 MCG/ACT nasal spray Commonly known as: FLONASE Place 2 sprays into both nostrils daily. Start taking on: August 14, 2019   furosemide 20 MG tablet Commonly known as: LASIX TAKE 1 TABLET(20 MG) BY MOUTH DAILY What changed: See the new instructions.   gabapentin 100 MG capsule Commonly known as: NEURONTIN Take 100 mg by mouth 2 (two) times daily.   IRON PO Take 1 tablet by mouth daily.   lurasidone 40 MG Tabs tablet Commonly known as: LATUDA Take 40 mg by mouth daily.   metFORMIN 500 MG tablet Commonly known as: GLUCOPHAGE Take 1 tablet (500 mg total) by mouth daily with breakfast.   metoprolol tartrate 50 MG tablet Commonly known as: LOPRESSOR Take 1 tablet (50 mg total) by mouth 2 (two) times daily.    predniSONE 20 MG tablet Commonly known as: DELTASONE Take 2 tablets (40 mg total) by mouth daily. Take 2 tablets ( ) daily x 2 days, then 1 tablet ( ) daily x 2 days then stop.   senna-docusate 8.6-50 MG tablet Commonly known as: Senokot-S Take 1 tablet by mouth 2 (two) times daily.   Vitamin D (Ergocalciferol) 1.25 MG (50000 UNIT) Caps capsule Commonly known as: DRISDOL TAKE 1 CAPSULE BY MOUTH EVERY 7 DAYS What changed:   how much to take  how to take this  when to take this            Durable Medical Equipment  (From admission, onward)         Start     Ordered   08/13/19 1158  For home use only DME oxygen  Once    Question Answer Comment  Length of Need 6 Months   Mode or (Route) Nasal cannula   Liters per Minute 2   Frequency Continuous (stationary and portable oxygen unit needed)   Oxygen conserving device Yes   Oxygen delivery system Gas      08/13/19 1158   08/10/19 0820  For home use only DME 3 n 1  Once     08/10/19 0819         Allergies  Allergen Reactions  . Bee Venom Swelling    Swelling at site   . Amoxicillin Hives and Other (See Comments)    Patient states that she can take this she just has a sensitivity when its taken along with pain medication  . Hydrocodone Hives and Other (See Comments)    Sensitivity when taken along with antibiotics per patient  . Insect Extract Allergy Skin Test Other (See Comments)    Spiders - make tongue burn  . Propoxyphene N-Acetaminophen Nausea And Vomiting and Rash   Follow-up Information    Barbette Merino, NP. Schedule an appointment as soon as possible for a visit in 1 week(s).   Specialty: Adult Health Nurse Practitioner Why: f/u in 1-2 weeks. Contact information: [address] [place] [address] [phone]            The results of significant diagnostics from this hospitalization (including imaging, microbiology, ancillary and laboratory) are listed below for reference.     Significant Diagnostic Studies: CT ANGIO CHEST PE W OR  WO CONTRAST  Result Date: 08/03/2019 CLINICAL DATA:  Shortness of breath, COVID, hypoxia EXAM: CT ANGIOGRAPHY CHEST WITH CONTRAST TECHNIQUE: Multidetector CT imaging of the chest was performed using the standard protocol during bolus administration of intravenous contrast. Multiplanar CT image reconstructions and MIPs were obtained to evaluate the vascular anatomy. CONTRAST:  OMNIPAQUE IOHEXOL 350 MG/ML SOLN COMPARISON:  None. FINDINGS: Cardiovascular: Heart size normal. No pericardial effusion. Satisfactory opacification of pulmonary arteries noted, and there is no evidence of pulmonary emboli. Adequate contrast opacification of the thoracic aorta with no evidence of dissection, aneurysm, or stenosis. There is classic 3-vessel brachiocephalic arch anatomy without proximal stenosis. No significant atheromatous change. Mediastinum/Nodes: No significant hilar or mediastinal adenopathy. Lungs/Pleura: No pleural effusion. No pneumothorax. Extensive ground-glass opacities scattered throughout both lungs, involving bases more than apices. Airspace consolidation posteriorly in both lower lobes with peripheral air bronchograms. Upper Abdomen: No acute findings. Musculoskeletal: No chest wall abnormality. No acute or significant osseous findings. Review of the MIP images confirms the above findings. IMPRESSION: 1. Negative for acute PE or thoracic aortic dissection. 2. Extensive ground-glass opacities scattered throughout both lungs, with consolidation posteriorly in both lower lobes, consistent with atypical/viral pneumonia. Electronically Signed   By: Corlis Leak M.D.   On: 08/03/2019 14:22   DG Chest Port 1 View  Result Date: 08/04/2019 CLINICAL DATA:  Respiratory failure and tachycardia. COVID pneumonia. EXAM: PORTABLE CHEST 1 VIEW COMPARISON:  CTA of the chest on 08/03/2019 FINDINGS: The heart size and mediastinal contours are within normal limits.  Bilateral airspace disease again noted which is more severe in the left lung by chest x-ray compared to the right. No significant pleural fluid. No pneumothorax. Visualized skeletal structures are unremarkable. IMPRESSION: Stable bilateral airspace disease, more severe on the left. Electronically Signed   By: Irish Lack M.D.   On: 08/04/2019 12:43   DG Chest Port 1 View  Result Date: 07/31/2019 CLINICAL DATA:  49 y.o female covid positive 3/16. Pt c/o SOB and cough. Hx HTN. Pt unable to take a deep inspiration for imaging. EXAM: PORTABLE CHEST 1 VIEW COMPARISON:  11/03/2016 FINDINGS: Left greater than right hazy airspace lung opacities in the mid to lower lungs predominantly, new since the prior exam, consistent with multifocal pneumonia. No convincing pleural effusion and no pneumothorax. Cardiac silhouette is normal in size. No mediastinal or hilar masses. Skeletal structures are grossly intact. IMPRESSION: 1. Hazy, left greater than right, bilateral airspace lung opacities consistent with multifocal pneumonia and in a pattern consistent COVID-19 pneumonia. Electronically Signed   By: Amie Portland M.D.   On: 07/31/2019 17:52   ECHOCARDIOGRAM COMPLETE  Result Date: 08/05/2019    ECHOCARDIOGRAM REPORT   Patient Name:   Christie Walker Date of Exam: 08/05/2019 Medical Rec #:  [id number]     Height:       65.0 in Accession #:    [id number]    Weight:       303.6 lb Date of Birth:  06/23/1970    BSA:          2.362 m Patient Age:    48 years      BP:           134/77 mmHg Patient Gender: F             HR:           112 bpm. Exam Location:  Inpatient Procedure: 2D Echo, Cardiac Doppler and Color Doppler Indications:    R06.02 SOB  History:  Patient has prior history of Echocardiogram examinations, most                 recent 09/23/2016. Signs/Symptoms:Dyspnea and Chest Pain; Risk                 Factors:Hypertension. Edema. Covid 19 positive. Pneumonia.  Sonographer:    Sheralyn Boatmanina West RDCS Referring Phys:  [id number] RIPUDEEP K RAI  Sonographer Comments: Technically difficult study due to poor echo windows, no subcostal window and patient is morbidly obese. Image acquisition challenging due to patient body habitus. IMPRESSIONS  1. Technically difficult study. Left ventricular ejection fraction, by estimation, is 65 to 70%. The left ventricle has grossly normal function. There is moderate left ventricular hypertrophy. Left ventricular diastolic parameters are indeterminate.  2. Right ventricular systolic function is normal. The right ventricular size is normal.  3. The mitral valve is normal in structure. No evidence of mitral valve regurgitation.  4. The aortic valve was not well visualized. Aortic valve regurgitation is not visualized. No aortic stenosis is present. FINDINGS  Left Ventricle: Left ventricular ejection fraction, by estimation, is 65 to 70%. The left ventricle has normal function. The left ventricle has no regional wall motion abnormalities. The left ventricular internal cavity size was small. There is moderate  left ventricular hypertrophy. Left ventricular diastolic parameters are indeterminate. Right Ventricle: The right ventricular size is normal. Right vetricular wall thickness was not assessed. Right ventricular systolic function is normal. Left Atrium: Left atrial size was normal in size. Right Atrium: Right atrial size was normal in size. Pericardium: Trivial pericardial effusion is present. Presence of pericardial fat pad. Mitral Valve: The mitral valve is normal in structure. No evidence of mitral valve regurgitation. Tricuspid Valve: The tricuspid valve is not well visualized. Tricuspid valve regurgitation is trivial. Aortic Valve: The aortic valve was not well visualized. Aortic valve regurgitation is not visualized. No aortic stenosis is present. Pulmonic Valve: The pulmonic valve was not well visualized. Pulmonic valve regurgitation is not visualized. Aorta: The aortic root and ascending aorta  are structurally normal, with no evidence of dilitation. IAS/Shunts: The interatrial septum was not well visualized.  LEFT VENTRICLE PLAX 2D LVIDd:         2.93 cm     Diastology LVIDs:         2.01 cm     LV e' lateral:   9.36 cm/s LV PW:         1.19 cm     LV E/e' lateral: 6.0 LV IVS:        1.39 cm     LV e' medial:    5.22 cm/s LVOT diam:     2.10 cm     LV E/e' medial:  10.7 LV SV:         47 LV SV Index:   20 LVOT Area:     3.46 cm  LV Volumes (MOD) LV vol d, MOD A2C: 33.2 ml LV vol d, MOD A4C: 49.0 ml LV vol s, MOD A2C: 12.5 ml LV vol s, MOD A4C: 17.4 ml LV SV MOD A2C:     20.7 ml LV SV MOD A4C:     49.0 ml LV SV MOD BP:      26.2 ml RIGHT VENTRICLE             IVC RV S prime:     14.40 cm/s  IVC diam: 1.93 cm TAPSE (M-mode): 1.5 cm LEFT ATRIUM  Index       RIGHT ATRIUM          Index LA diam:        2.40 cm 1.02 cm/m  RA Area:     6.50 cm LA Vol (A2C):   23.9 ml 10.12 ml/m RA Volume:   9.07 ml  3.84 ml/m LA Vol (A4C):   18.7 ml 7.92 ml/m LA Biplane Vol: 21.7 ml 9.19 ml/m  AORTIC VALVE LVOT Vmax:   103.00 cm/s LVOT Vmean:  71.300 cm/s LVOT VTI:    0.135 m  AORTA Ao Root diam: 2.90 cm Ao Asc diam:  3.00 cm MITRAL VALVE MV Area (PHT): 4.80 cm    SHUNTS MV Decel Time: 158 msec    Systemic VTI:  0.14 m MV E velocity: 55.80 cm/s  Systemic Diam: 2.10 cm MV A velocity: 66.90 cm/s MV E/A ratio:  0.83 Epifanio Lesches MD Electronically signed by Epifanio Lesches MD Signature Date/Time: 08/05/2019/7:07:30 PM    Final     Microbiology: No results found for this or any previous visit (from the past 240 hour(s)).   Labs: Basic Metabolic Panel: Recent Labs  Lab 08/09/19 0422 08/10/19 0355 08/11/19 0415 08/12/19 0444 08/13/19 0416  NA 138 138 136 136 137  K 4.0 3.8 4.1 4.2 3.9  CL 98 97* 98 100 101  CO2 GLUCOSE 200* 220* 214* 221* 131*  BUN 27* 27* 29* 30* 30*  CREATININE 0.98 0.95 0.89 0.87 0.83  CALCIUM 9.1 9.2 8.9 9.1 8.6*  MG 2.8*  --   --  2.6*  --     Liver Function Tests: No results for input(s): AST, ALT, ALKPHOS, BILITOT, PROT, ALBUMIN in the last 168 hours. No results for input(s): LIPASE, AMYLASE in the last 168 hours. No results for input(s): AMMONIA in the last 168 hours. CBC: Recent Labs  Lab 08/09/19 0422 08/10/19 0355 08/11/19 0415 08/12/19 0444 08/13/19 0416  WBC 16.5* 16.5* 15.2* 15.2* 13.7*  NEUTROABS 14.1*  --   --   --   --   HGB 16.7* 16.7* 15.9* 16.3* 14.9  HCT 52.5* 52.0* 50.0* 50.2* 46.5*  MCV 92.4 91.7 92.8 90.8 92.3  PLT 607* 563* 468* 459* 363   Cardiac Enzymes: No results for input(s): CKTOTAL, CKMB, CKMBINDEX, TROPONINI in the last 168 hours. BNP: BNP (last 3 results) Recent Labs    07/31/19 2139 08/04/19 1155  BNP 22.2 31.5    ProBNP (last 3 results) No results for input(s): PROBNP in the last 8760 hours.  CBG: Recent Labs  Lab 08/12/19 1123 08/12/19 1634 08/12/19 2217 08/13/19 0813 08/13/19 1127  GLUCAP 175* 227* 184* 98 165*       Signed:  Ramiro Harvest MD.  [facility] 08/13/2019, 2:57 PM

## 2019-08-13 NOTE — Progress Notes (Signed)
SATURATION QUALIFICATIONS: (This note is used to comply with regulatory documentation for home oxygen)  Patient Saturations on Room Air at Rest = 95%  Patient Saturations on Room Air while Ambulating = 93%  Patient Saturations on Liters of oxygen while Ambulating = % NT  Please briefly explain why patient needs home oxygen: no needs at this time Blanchard Kelch PT Acute Rehabilitation Services Pager [phone] Office [phone]

## 2019-08-13 NOTE — Progress Notes (Signed)
Physical Therapy Treatment Patient Details Name: Christie Walker MRN: [id number] DOB: [birth year] Today's Date: 08/13/2019    History of Present Illness Ms Pevehouse is a 49 y.o. F with BMI 50, Bipolar in remission, DM and HTN who presented3/24/21  with 1 week fever, chills, cough, chest tightness/dyspnea and SOB with exertion.  In the ER, temp 100.27F, tachypneic, O2 sat 84% on room air.  CXR with bilateral opacities.  COVID+ on 3/16.    PT Comments    The patient has progressed well. Ambulated x 80' in room on RA with SPO2 >93%. Patient plans Dc home son. Reviewed walking and HEP and IS for home.   Follow Up Recommendations  No PT follow up     Equipment Recommendations  None recommended by PT    Recommendations for Other Services       Precautions / Restrictions Precautions Precautions: Fall Precaution Comments: monitor sats    Mobility  Bed Mobility Overal bed mobility: Independent                Transfers Overall transfer level: Independent   Transfers: Sit to/from [employer] Sit to Stand: Supervision Stand pivot transfers: Min guard          Ambulation/Gait Ambulation/Gait assistance: Independent Gait Distance (Feet): 80 Feet Assistive device: None Gait Pattern/deviations: Step-through pattern   Gait velocity interpretation: <1.31 ft/sec, indicative of household ambulator General Gait Details: no balance loss notedmwith multiple turns   Social research officer, government Rankin (Stroke Patients Only)       Balance Overall balance assessment: No apparent balance deficits (not formally assessed)                                          Cognition Arousal/Alertness: Awake/alert Behavior During Therapy: WFL for tasks assessed/performed Overall Cognitive Status: Within Functional Limits for tasks assessed                                        Exercises Other  Exercises Other Exercises: Covid HEP with level 2 theraband. reviewed IS x 10 / hour and to ambulate every hour    General Comments        Pertinent Vitals/Pain Pain Assessment: No/denies pain    Home Living                      Prior Function            PT Goals (current goals can now be found in the care plan section) Progress towards PT goals: Progressing toward goals    Frequency    Min 3X/week      PT Plan Current plan remains appropriate    Co-evaluation              AM-PAC PT "6 Clicks" Mobility   Outcome Measure  Help needed turning from your back to your side while in a flat bed without using bedrails?: None Help needed moving from lying on your back to sitting on the side of a flat bed without using bedrails?: None Help needed moving to and from a bed to a chair (including a wheelchair)?: None Help needed standing up from a chair using your  arms (e.g., wheelchair or bedside chair)?: None Help needed to walk in hospital room?: None Help needed climbing 3-5 steps with a railing? : A Little 6 Click Score: 23    End of Session   Activity Tolerance: Patient tolerated treatment well Patient left: in bed;with call bell/phone within reach Nurse Communication: Mobility status PT Visit Diagnosis: Unsteadiness on feet (R26.81);Difficulty in walking, not elsewhere classified (R26.2)     Time: 1610-9604 PT Time Calculation (min) (ACUTE ONLY): 24 min  Charges:  $Gait Training: 8-22 mins $Self Care/Home Management: [place] Pager [phone] Office [phone]    Claretha Cooper 08/13/2019, 2:57 PM

## 2019-08-13 NOTE — TOC Progression Note (Signed)
Transition of Care [facility]) - Progression Note    Patient Details  Name: Christie Walker MRN: [id number] Date of Birth: 1970/09/14  Transition [facility]) CM/SW Contact  Geni Bers, RN Phone Number: 08/13/2019, 3:58 PM  Clinical Narrative:     Patsy Lager was called for O2, Co-pay for pt is $25-$30. Explained to pt and she stated, "I am just not going to do it."  Expected Discharge Plan: Home/Self Care Barriers to Discharge: No Barriers Identified  Expected Discharge Plan and Services Expected Discharge Plan: Home/Self Care       Living arrangements for the past 2 months: Single Family Home Expected Discharge Date: 08/13/19                                     Social Determinants of Health (SDOH) Interventions    Readmission Risk Interventions No flowsheet data found.

## 2019-09-12 ENCOUNTER — Ambulatory Visit: Payer: Self-pay | Admitting: Nurse Practitioner

## 2019-10-04 ENCOUNTER — Other Ambulatory Visit: Payer: Self-pay

## 2019-10-04 ENCOUNTER — Ambulatory Visit (INDEPENDENT_AMBULATORY_CARE_PROVIDER_SITE_OTHER): Payer: 59 | Admitting: Nurse Practitioner

## 2019-10-04 ENCOUNTER — Encounter: Payer: Self-pay | Admitting: Nurse Practitioner

## 2019-10-04 VITALS — BP 110/74 | HR 86 | Temp 97.6°F | Ht 64.5 in | Wt 305.0 lb

## 2019-10-04 DIAGNOSIS — E669 Obesity, unspecified: Secondary | ICD-10-CM | POA: Diagnosis not present

## 2019-10-04 DIAGNOSIS — Z6841 Body Mass Index (BMI) 40.0 and over, adult: Secondary | ICD-10-CM

## 2019-10-04 DIAGNOSIS — I10 Essential (primary) hypertension: Secondary | ICD-10-CM

## 2019-10-04 DIAGNOSIS — Z8616 Personal history of COVID-19: Secondary | ICD-10-CM

## 2019-10-04 NOTE — Progress Notes (Signed)
[facility] Patient [facility] [address] [place], [place]  [address] Phone:  [phone]   Fax:  [phone]   Established Patient Office Visit  Subjective:  Patient ID: Christie Walker, female    DOB: 12/22/1970  Age: 49 y.o. MRN: [id number]  CC:  Chief Complaint  Patient presents with  . Follow-up    hypertension    HPI Christie Walker presents for follow up. She  has a past medical history of Acid reflux, Bipolar 1 disorder (HCC), and Essential hypertension (06/14/2016).    Upper Respiratory Infection Patient complains of symptoms of a URI, follow up.  July 31, 2019 she was hospitalized for pneumonia due to COVID-19, acute respiratory failure with hypoxia, systemic inflammatory response syndrome.  She continues to have shortness of breath.  She feels like her overall breathing has changed.  She denies any fever, chills,headache, dizziness, visual changes, chest pain, nausea, vomiting or any edema.   Hypertension Patient is here for follow-up of elevated blood pressure. She is not exercising and is adherent to a low-salt diet. Blood pressure is not well controlled at home. Cardiac symptoms: exertional chest pressure/discomfort. Relates this to post COV-19 Patient denies claudication, fatigue, irregular heart beat, lower extremity edema, palpitations and syncope. Cardiovascular risk factors: hypertension, obesity (BMI >= 30 kg/m2) and sedentary lifestyle. Use of agents associated with hypertension: none. History of target organ damage: none.   Past Medical History:  Diagnosis Date  . Acid reflux   . Bipolar 1 disorder (HCC)   . Essential hypertension 06/14/2016    No past surgical history on file.  Family History  Problem Relation Age of Onset  . Mental illness Other     Social History   Socioeconomic History  . Marital status: Legally Separated    Spouse name: Not on file  . Number of children: Not on file  . Years of education: Not on file  .  Highest education level: Not on file  Occupational History  . Not on file  Tobacco Use  . Smoking status: Never Smoker  . Smokeless tobacco: Never Used  Substance and Sexual Activity  . Alcohol use: No  . Drug use: No  . Sexual activity: Not Currently    Birth control/protection: None  Other Topics Concern  . Not on file  Social History Narrative  . Not on file   Social Determinants of Health   Financial Resource Strain:   . Difficulty of Paying Living Expenses:   Food Insecurity:   . Worried About Programme researcher, broadcasting/film/video in the Last Year:   . Barista in the Last Year:   Transportation Needs:   . Freight forwarder (Medical):   Marland Kitchen Lack of Transportation (Non-Medical):   Physical Activity:   . Days of Exercise per Week:   . Minutes of Exercise per Session:   Stress:   . Feeling of Stress :   Social Connections:   . Frequency of Communication with Friends and Family:   . Frequency of Social Gatherings with Friends and Family:   . Attends Religious Services:   . Active Member of Clubs or Organizations:   . Attends Banker Meetings:   Marland Kitchen Marital Status:   Intimate Partner Violence:   . Fear of Current or Ex-Partner:   . Emotionally Abused:   Marland Kitchen Physically Abused:   . Sexually Abused:     Outpatient Medications Prior to Visit  Medication Sig Dispense Refill  . acetaminophen (  TYLENOL) 325 MG tablet Take 650 mg by mouth every 6 (six) hours as needed for mild pain or headache.    . albuterol (PROVENTIL HFA;VENTOLIN HFA) 108 (90 Base) MCG/ACT inhaler Inhale 2 puffs into the lungs every 6 (six) hours as needed for wheezing or shortness of breath.    . fluticasone (FLONASE) 50 MCG/ACT nasal spray Place 2 sprays into both nostrils daily. 9.9 g 0  . furosemide (LASIX) 20 MG tablet TAKE 1 TABLET(20 MG) BY MOUTH DAILY (Patient taking differently: Take 20 mg by mouth daily. ) 30 tablet 5  . gabapentin (NEURONTIN) 100 MG capsule Take 100 mg by mouth 2 (two) times  daily.     . IRON PO Take 1 tablet by mouth daily.    Marland Kitchen lurasidone (LATUDA) 40 MG TABS tablet Take 40 mg by mouth daily.    . metFORMIN (GLUCOPHAGE) 500 MG tablet Take 1 tablet (500 mg total) by mouth daily with breakfast. 90 tablet 5  . metoprolol tartrate (LOPRESSOR) 50 MG tablet Take 1 tablet (50 mg total) by mouth 2 (two) times daily. 60 tablet 1  . senna-docusate (SENOKOT-S) 8.6-50 MG tablet Take 1 tablet by mouth 2 (two) times daily.    . Vitamin D, Ergocalciferol, (DRISDOL) 1.25 MG (50000 UNIT) CAPS capsule TAKE 1 CAPSULE BY MOUTH EVERY 7 DAYS (Patient taking differently: Take 50,000 Units by mouth every 7 (seven) days. TAKE 1 CAPSULE BY MOUTH EVERY 7 DAYS) 8 capsule 0  . predniSONE (DELTASONE) 20 MG tablet Take 2 tablets (40 mg total) by mouth daily. Take 2 tablets (40mg ) daily x 2 days, then 1 tablet (20mg ) daily x 2 days then stop. (Patient not taking: Reported on 10/04/2019) 6 tablet 0   No facility-administered medications prior to visit.    Allergies  Allergen Reactions  . Bee Venom Swelling    Swelling at site   . Amoxicillin Hives and Other (See Comments)    Patient states that she can take this she just has a sensitivity when its taken along with pain medication  . Hydrocodone Hives and Other (See Comments)    Sensitivity when taken along with antibiotics per patient  . Insect Extract Allergy Skin Test Other (See Comments)    Spiders - make tongue burn  . Propoxyphene N-Acetaminophen Nausea And Vomiting and Rash    ROS Review of Systems  All other systems reviewed and are negative.     Objective:    Physical Exam  Constitutional: She is oriented to person, place, and time. She appears well-developed. No distress.  Obese  HENT:  Head: Normocephalic.  Eyes: Pupils are equal, round, and reactive to light. Conjunctivae are normal.  Cardiovascular: Normal rate, regular rhythm, normal heart sounds and intact distal pulses.  Pulmonary/Chest: Effort normal and breath  sounds normal.  Abdominal: Soft. Bowel sounds are normal.  Musculoskeletal:        General: Normal range of motion.     Cervical back: Normal range of motion.  Neurological: She is alert and oriented to person, place, and time.  Skin: Skin is warm and dry.  Psychiatric: She has a normal mood and affect. Her behavior is normal. Judgment and thought content normal.    BP 110/74   Pulse 86   Temp 97.6 F (36.4 C)   Ht 5' 4.5" (1.638 m)   Wt (!) 305 lb (138.3 kg)   SpO2 97%   BMI 51.54 kg/m  Wt Readings from Last 3 Encounters:  10/04/19 (!) 305 lb (138.3  kg)  08/13/19 292 lb 12.3 oz (132.8 kg)  06/14/19 (!) 314 lb (142.4 kg)     Health Maintenance Due  Topic Date Due  . PNEUMOCOCCAL POLYSACCHARIDE VACCINE AGE 66-64 HIGH RISK  Never done  . FOOT EXAM  Never done  . OPHTHALMOLOGY EXAM  Never done  . COVID-19 Vaccine (1) Never done  . URINE MICROALBUMIN  06/16/2019  . PAP SMEAR-Modifier  09/16/2019    There are no preventive care reminders to display for this patient.  Lab Results  Component Value Date   TSH 0.262 (L) 08/06/2019   Lab Results  Component Value Date   WBC 13.7 (H) 08/13/2019   HGB 14.9 08/13/2019   HCT 46.5 (H) 08/13/2019   MCV 92.3 08/13/2019   PLT 363 08/13/2019   Lab Results  Component Value Date   NA 137 08/13/2019   K 3.9 08/13/2019   CO2 26 08/13/2019   GLUCOSE 131 (H) 08/13/2019   BUN 30 (H) 08/13/2019   CREATININE 0.83 08/13/2019   BILITOT 0.4 08/05/2019   ALKPHOS 85 08/05/2019   AST 34 08/05/2019   ALT 48 (H) 08/05/2019   PROT 7.4 08/05/2019   ALBUMIN 3.3 (L) 08/05/2019   CALCIUM 8.6 (L) 08/13/2019   ANIONGAP 10 08/13/2019   Lab Results  Component Value Date   CHOL 202 (H) 06/14/2016   Lab Results  Component Value Date   HDL 44 (L) 06/14/2016   Lab Results  Component Value Date   LDLCALC 126 (H) 06/14/2016   Lab Results  Component Value Date   TRIG 141 07/31/2019   Lab Results  Component Value Date   CHOLHDL 4.6  06/14/2016   Lab Results  Component Value Date   HGBA1C 6.5 (H) 08/06/2019      Assessment & Plan:   Problem List Items Addressed This Visit      Cardiovascular and Mediastinum   HTN (hypertension) - Primary   Relevant Orders   Comp. Metabolic Panel (12)     Other   OBESITY    Other Visit Diagnoses    Body mass index (BMI) of 50-59.9 in adult [facility])       Relevant Orders   Lipid panel   TSH   History of 2019 novel coronavirus disease (COVID-19)          No orders of the defined types were placed in this encounter.   Follow-up: Return in about 3 months (around 01/04/2020).    Vevelyn Francois, NP

## 2019-10-04 NOTE — Patient Instructions (Signed)
Managing Your Hypertension Hypertension is commonly called high blood pressure. This is when the force of your blood pressing against the walls of your arteries is too strong. Arteries are blood vessels that carry blood from your heart throughout your body. Hypertension forces the heart to work harder to pump blood, and may cause the arteries to become narrow or stiff. Having untreated or uncontrolled hypertension can cause heart attack, stroke, kidney disease, and other problems. What are blood pressure readings? A blood pressure reading consists of a higher number over a lower number. Ideally, your blood pressure should be below 120/80. The first ("top") number is called the systolic pressure. It is a measure of the pressure in your arteries as your heart beats. The second ("bottom") number is called the diastolic pressure. It is a measure of the pressure in your arteries as the heart relaxes. What does my blood pressure reading mean? Blood pressure is classified into four stages. Based on your blood pressure reading, your health care provider may use the following stages to determine what type of treatment you need, if any. Systolic pressure and diastolic pressure are measured in a unit called mm Hg. Normal  Systolic pressure: below 120.  Diastolic pressure: below 80. Elevated  Systolic pressure: 120-129.  Diastolic pressure: below 80. Hypertension stage 1  Systolic pressure: 130-139.  Diastolic pressure: 80-89. Hypertension stage 2  Systolic pressure: 140 or above.  Diastolic pressure: 90 or above. What health risks are associated with hypertension? Managing your hypertension is an important responsibility. Uncontrolled hypertension can lead to:  A heart attack.  A stroke.  A weakened blood vessel (aneurysm).  Heart failure.  Kidney damage.  Eye damage.  Metabolic syndrome.  Memory and concentration problems. What changes can I make to manage my  hypertension? Hypertension can be managed by making lifestyle changes and possibly by taking medicines. Your health care provider will help you make a plan to bring your blood pressure within a normal range. Eating and drinking   Eat a diet that is high in fiber and potassium, and low in salt (sodium), added sugar, and fat. An example eating plan is called the DASH (Dietary Approaches to Stop Hypertension) diet. To eat this way: ? Eat plenty of fresh fruits and vegetables. Try to fill half of your plate at each meal with fruits and vegetables. ? Eat whole grains, such as whole wheat pasta, brown rice, or whole grain bread. Fill about one quarter of your plate with whole grains. ? Eat low-fat diary products. ? Avoid fatty cuts of meat, processed or cured meats, and poultry with skin. Fill about one quarter of your plate with lean proteins such as fish, chicken without skin, beans, eggs, and tofu. ? Avoid premade and processed foods. These tend to be higher in sodium, added sugar, and fat.  Reduce your daily sodium intake. Most people with hypertension should eat less than 1,500 mg of sodium a day.  Limit alcohol intake to no more than 1 drink a day for nonpregnant women and 2 drinks a day for men. One drink equals 12 oz of beer, 5 oz of wine, or 1 oz of hard liquor. Lifestyle  Work with your health care provider to maintain a healthy body weight, or to lose weight. Ask what an ideal weight is for you.  Get at least 30 minutes of exercise that causes your heart to beat faster (aerobic exercise) most days of the week. Activities may include walking, swimming, or biking.  Include exercise   to strengthen your muscles (resistance exercise), such as weight lifting, as part of your weekly exercise routine. Try to do these types of exercises for 30 minutes at least 3 days a week.  Do not use any products that contain nicotine or tobacco, such as cigarettes and e-cigarettes. If you need help quitting,  ask your health care provider.  Control any long-term (chronic) conditions you have, such as high cholesterol or diabetes. Monitoring  Monitor your blood pressure at home as told by your health care provider. Your personal target blood pressure may vary depending on your medical conditions, your age, and other factors.  Have your blood pressure checked regularly, as often as told by your health care provider. Working with your health care provider  Review all the medicines you take with your health care provider because there may be side effects or interactions.  Talk with your health care provider about your diet, exercise habits, and other lifestyle factors that may be contributing to hypertension.  Visit your health care provider regularly. Your health care provider can help you create and adjust your plan for managing hypertension. Will I need medicine to control my blood pressure? Your health care provider may prescribe medicine if lifestyle changes are not enough to get your blood pressure under control, and if:  Your systolic blood pressure is 130 or higher.  Your diastolic blood pressure is 80 or higher. Take medicines only as told by your health care provider. Follow the directions carefully. Blood pressure medicines must be taken as prescribed. The medicine does not work as well when you skip doses. Skipping doses also puts you at risk for problems. Contact a health care provider if:  You think you are having a reaction to medicines you have taken.  You have repeated (recurrent) headaches.  You feel dizzy.  You have swelling in your ankles.  You have trouble with your vision. Get help right away if:  You develop a severe headache or confusion.  You have unusual weakness or numbness, or you feel faint.  You have severe pain in your chest or abdomen.  You vomit repeatedly.  You have trouble breathing. Summary  Hypertension is when the force of blood pumping  through your arteries is too strong. If this condition is not controlled, it may put you at risk for serious complications.  Your personal target blood pressure may vary depending on your medical conditions, your age, and other factors. For most people, a normal blood pressure is less than 120/80.  Hypertension is managed by lifestyle changes, medicines, or both. Lifestyle changes include weight loss, eating a healthy, low-sodium diet, exercising more, and limiting alcohol. This information is not intended to replace advice given to you by your health care provider. Make sure you discuss any questions you have with your health care provider. Document Revised: 08/17/2018 Document Reviewed: 03/23/2016 Elsevier Patient Education  [place].  Preventing Unhealthy [employer], Adult Staying at a healthy weight is important to your overall health. When fat builds up in your body, you may become overweight or obese. Being overweight or obese increases your risk of developing certain health problems, such as heart disease, diabetes, sleeping problems, joint problems, and some types of cancer. Unhealthy weight gain is often the result of making unhealthy food choices or not getting enough exercise. You can make changes to your lifestyle to prevent obesity and stay as healthy as possible. What nutrition changes can be made?   Eat only as much as your  body needs. To do this: ? Pay attention to signs that you are hungry or full. Stop eating as soon as you feel full. ? If you feel hungry, try drinking water first before eating. Drink enough water so your urine is clear or pale yellow. ? Eat smaller portions. Pay attention to portion sizes when eating out. ? Look at serving sizes on food labels. Most foods contain more than one serving per container. ? Eat the recommended number of calories for your gender and activity level. For most active people, a daily total of 2,000 calories is appropriate. If  you are trying to lose weight or are not very active, you may need to eat fewer calories. Talk with your health care provider or a diet and nutrition specialist (dietitian) about how many calories you need each day.  Choose healthy foods, such as: ? Fruits and vegetables. At each meal, try to fill at least half of your plate with fruits and vegetables. ? Whole grains, such as whole-wheat bread, brown rice, and quinoa. ? Lean meats, such as chicken or fish. ? Other healthy proteins, such as beans, eggs, or tofu. ? Healthy fats, such as nuts, seeds, fatty fish, and olive oil. ? Low-fat or fat-free dairy products.  Check food labels, and avoid food and drinks that: ? Are high in calories. ? Have added sugar. ? Are high in sodium. ? Have saturated fats or trans fats.  Cook foods in healthier ways, such as by baking, broiling, or grilling.  Make a meal plan for the week, and shop with a grocery list to help you stay on track with your purchases. Try to avoid going to the grocery store when you are hungry.  When grocery shopping, try to shop around the outside of the store first, where the fresh foods are. Doing this helps you to avoid prepackaged foods, which can be high in sugar, salt (sodium), and fat. What lifestyle changes can be made?   Exercise for 30 or more minutes on 5 or more days each week. Exercising may include brisk walking, yard work, biking, running, swimming, and team sports like basketball and soccer. Ask your health care provider which exercises are safe for you.  Do muscle-strengthening activities, such as lifting weights or using resistance bands, on 2 or more days a week.  Do not use any products that contain nicotine or tobacco, such as cigarettes and e-cigarettes. If you need help quitting, ask your health care provider.  Limit alcohol intake to no more than 1 drink a day for nonpregnant women and 2 drinks a day for men. One drink equals 12 oz of beer, 5 oz of wine,  or 1 oz of hard liquor.  Try to get 7-9 hours of sleep each night. What other changes can be made?  Keep a food and activity journal to keep track of: ? What you ate and how many calories you had. Remember to count the calories in sauces, dressings, and side dishes. ? Whether you were active, and what exercises you did. ? Your calorie, weight, and activity goals.  Check your weight regularly. Track any changes. If you notice you have gained weight, make changes to your diet or activity routine.  Avoid taking weight-loss medicines or supplements. Talk to your health care provider before starting any new medicine or supplement.  Talk to your health care provider before trying any new diet or exercise plan. Why are these changes important? Eating healthy, staying active, and having healthy habits  can help you to prevent obesity. Those changes also:  Help you manage stress and emotions.  Help you connect with friends and family.  Improve your self-esteem.  Improve your sleep.  Prevent long-term health problems. What can happen if changes are not made? Being obese or overweight can cause you to develop joint or bone problems, which can make it hard for you to stay active or do activities you enjoy. Being obese or overweight also puts stress on your heart and lungs and can lead to health problems like diabetes, heart disease, and some cancers. Where to find more information Talk with your health care provider or a dietitian about healthy eating and healthy lifestyle choices. You may also find information from:  U.S. Department of Agriculture, MyPlate: https://ball-collins.biz/  American Heart Association: www.heart.org  Centers for Disease Control and Prevention: FootballExhibition.com.br Summary  Staying at a healthy weight is important to your overall health. It helps you to prevent certain diseases and health problems, such as heart disease, diabetes, joint problems, sleep disorders, and some  types of cancer.  Being obese or overweight can cause you to develop joint or bone problems, which can make it hard for you to stay active or do activities you enjoy.  You can prevent unhealthy weight gain by eating a healthy diet, exercising regularly, not smoking, limiting alcohol, and getting enough sleep.  Talk with your health care provider or a dietitian for guidance about healthy eating and healthy lifestyle choices. This information is not intended to replace advice given to you by your health care provider. Make sure you discuss any questions you have with your health care provider. Document Revised: 04/28/2017 Document Reviewed: 06/01/2016 Elsevier Patient Education  2020 [employer].

## 2019-10-05 LAB — LIPID PANEL
Chol/HDL Ratio: 4.9 ratio — ABNORMAL HIGH (ref 0.0–4.4)
Cholesterol, Total: 230 mg/dL — ABNORMAL HIGH (ref 100–199)
HDL: 47 mg/dL (ref >39)
LDL Chol Calc (NIH): 156 mg/dL — ABNORMAL HIGH (ref 0–99)
Triglycerides: 150 mg/dL — ABNORMAL HIGH (ref 0–149)
VLDL Cholesterol Cal: 27 mg/dL (ref 5–40)

## 2019-10-05 LAB — TSH: TSH: 1.08 u[IU]/mL (ref 0.450–4.500)

## 2019-10-06 LAB — COMP. METABOLIC PANEL (12)
AST: 13 IU/L (ref 0–40)
Albumin/Globulin Ratio: 1.5 (ref 1.2–2.2)
Albumin: 3.7 g/dL — ABNORMAL LOW (ref 3.8–4.8)
Alkaline Phosphatase: 74 IU/L (ref 48–121)
BUN/Creatinine Ratio: 11 (ref 9–23)
BUN: 8 mg/dL (ref 6–24)
Bilirubin Total: 0.3 mg/dL (ref 0.0–1.2)
Calcium: 9.4 mg/dL (ref 8.7–10.2)
Chloride: 105 mmol/L (ref 96–106)
Creatinine, Ser: 0.74 mg/dL (ref 0.57–1.00)
GFR calc Af Amer: 112 mL/min/{1.73_m2} (ref >59)
GFR calc non Af Amer: 97 mL/min/{1.73_m2} (ref >59)
Globulin, Total: 2.5 g/dL (ref 1.5–4.5)
Glucose: 91 mg/dL (ref 65–99)
Potassium: 4 mmol/L (ref 3.5–5.2)
Sodium: 145 mmol/L — ABNORMAL HIGH (ref 134–144)
Total Protein: 6.2 g/dL (ref 6.0–8.5)

## 2019-10-06 LAB — SPECIMEN STATUS REPORT

## 2019-10-09 NOTE — Progress Notes (Signed)
Called and informed pt of her results , she would like to  try diet and exercise first ,before using a medication.

## 2019-12-20 ENCOUNTER — Other Ambulatory Visit: Payer: Self-pay | Admitting: Internal Medicine

## 2019-12-20 DIAGNOSIS — I10 Essential (primary) hypertension: Secondary | ICD-10-CM

## 2019-12-20 DIAGNOSIS — R600 Localized edema: Secondary | ICD-10-CM

## 2019-12-24 ENCOUNTER — Telehealth: Payer: Self-pay | Admitting: Nurse Practitioner

## 2019-12-24 NOTE — Telephone Encounter (Signed)
error 

## 2019-12-27 ENCOUNTER — Telehealth (HOSPITAL_COMMUNITY): Payer: 59

## 2019-12-30 ENCOUNTER — Telehealth: Payer: Self-pay | Admitting: Nurse Practitioner

## 2019-12-30 NOTE — Telephone Encounter (Signed)
Patient requesting refill on Furosemide. Uses [facility] on [address]

## 2019-12-30 NOTE — Telephone Encounter (Signed)
This was sent on 12/20/19. She needs to call the pharmacy  furosemide (LASIX) 20 MG tablet [[id number]]    Order Details Dose, Route, Frequency: As Directed  Dispense Quantity: 30 tablet Refills: 5        Sig: TAKE 1 TABLET(20 MG) BY MOUTH DAILY       Start Date: 12/20/19 End Date: --  Written Date: 12/20/19 Expiration Date: 12/19/20     Diagnosis Association: Bilateral lower extremity edema (R60.0); Essential hypertension (I10)  Original Order:  furosemide (LASIX) 20 MG tablet [[id number]]  Providers  Ordering and Authorizing Provider: Barbette Merino, NP NPI: [id number]   DEA #: [id number]  Ordering User:  Loney Hering, LPN      Pharmacy  [facility] [phone] [place] Otto, [place] - [address] AT [facility] [address] [facility]  [address] [place] [address]  Phone:  [phone]  Fax:  [phone]  DEA #:  [id number]

## 2019-12-31 ENCOUNTER — Other Ambulatory Visit: Payer: Self-pay | Admitting: Nurse Practitioner

## 2019-12-31 DIAGNOSIS — R600 Localized edema: Secondary | ICD-10-CM

## 2019-12-31 DIAGNOSIS — I10 Essential (primary) hypertension: Secondary | ICD-10-CM

## 2019-12-31 NOTE — Telephone Encounter (Signed)
Called patient she picked up prescription on 12-30-2019

## 2020-01-01 ENCOUNTER — Telehealth: Payer: Self-pay | Admitting: Nurse Practitioner

## 2020-01-01 NOTE — Telephone Encounter (Signed)
DOne

## 2020-01-06 ENCOUNTER — Ambulatory Visit: Payer: 59 | Admitting: Nurse Practitioner

## 2020-01-10 ENCOUNTER — Other Ambulatory Visit: Payer: Self-pay

## 2020-01-10 ENCOUNTER — Encounter: Payer: Self-pay | Admitting: Nurse Practitioner

## 2020-01-10 ENCOUNTER — Ambulatory Visit (INDEPENDENT_AMBULATORY_CARE_PROVIDER_SITE_OTHER): Payer: 59 | Admitting: Nurse Practitioner

## 2020-01-10 VITALS — BP 128/75 | HR 83 | Temp 97.8°F | Ht 64.5 in | Wt 311.0 lb

## 2020-01-10 DIAGNOSIS — I10 Essential (primary) hypertension: Secondary | ICD-10-CM

## 2020-01-10 DIAGNOSIS — Z6841 Body Mass Index (BMI) 40.0 and over, adult: Secondary | ICD-10-CM | POA: Diagnosis not present

## 2020-01-10 DIAGNOSIS — E7800 Pure hypercholesterolemia, unspecified: Secondary | ICD-10-CM

## 2020-01-10 DIAGNOSIS — D649 Anemia, unspecified: Secondary | ICD-10-CM

## 2020-01-10 DIAGNOSIS — R7303 Prediabetes: Secondary | ICD-10-CM

## 2020-01-10 LAB — POCT GLYCOSYLATED HEMOGLOBIN (HGB A1C)
HbA1c POC (<> result, manual entry): 5.8 % (ref 4.0–5.6)
HbA1c, POC (controlled diabetic range): 5.8 % (ref 0.0–7.0)
HbA1c, POC (prediabetic range): 5.8 % (ref 5.7–6.4)
Hemoglobin A1C: 5.8 % — AB (ref 4.0–5.6)

## 2020-01-10 LAB — POCT URINALYSIS DIPSTICK
Bilirubin, UA: NEGATIVE
Blood, UA: NEGATIVE
Glucose, UA: NEGATIVE
Ketones, UA: NEGATIVE
Leukocytes, UA: NEGATIVE
Nitrite, UA: NEGATIVE
Protein, UA: POSITIVE — AB
Spec Grav, UA: >=1.03 — AB
Urobilinogen, UA: 0.2 U/dL
pH, UA: 5

## 2020-01-10 NOTE — Patient Instructions (Signed)
Healthy Eating Following a healthy eating pattern may help you to achieve and maintain a healthy body weight, reduce the risk of chronic disease, and live a long and productive life. It is important to follow a healthy eating pattern at an appropriate calorie level for your body. Your nutritional needs should be met primarily through food by choosing a variety of nutrient-rich foods. What are tips for following this plan? Reading food labels  Read labels and choose the following: ? Reduced or low sodium. ? Juices with 100% fruit juice. ? Foods with low saturated fats and high polyunsaturated and monounsaturated fats. ? Foods with whole grains, such as whole wheat, cracked wheat, brown rice, and wild rice. ? Whole grains that are fortified with folic acid. This is recommended for women who are pregnant or who want to become pregnant.  Read labels and avoid the following: ? Foods with a lot of added sugars. These include foods that contain brown sugar, corn sweetener, corn syrup, dextrose, fructose, glucose, high-fructose corn syrup, honey, invert sugar, lactose, malt syrup, maltose, molasses, raw sugar, sucrose, trehalose, or turbinado sugar.  Do not eat more than the following amounts of added sugar per day:  6 teaspoons (25 g) for women.  9 teaspoons (38 g) for men. ? Foods that contain processed or refined starches and grains. ? Refined grain products, such as white flour, degermed cornmeal, white bread, and white rice. Shopping  Choose nutrient-rich snacks, such as vegetables, whole fruits, and nuts. Avoid high-calorie and high-sugar snacks, such as potato chips, fruit snacks, and candy.  Use oil-based dressings and spreads on foods instead of solid fats such as butter, stick margarine, or cream cheese.  Limit pre-made sauces, mixes, and "instant" products such as flavored rice, instant noodles, and ready-made pasta.  Try more plant-protein sources, such as tofu, tempeh, black beans,  edamame, lentils, nuts, and seeds.  Explore eating plans such as the Mediterranean diet or vegetarian diet. Cooking  Use oil to saut or stir-fry foods instead of solid fats such as butter, stick margarine, or lard.  Try baking, boiling, grilling, or broiling instead of frying.  Remove the fatty part of meats before cooking.  Steam vegetables in water or broth. Meal planning   At meals, imagine dividing your plate into fourths: ? One-half of your plate is fruits and vegetables. ? One-fourth of your plate is whole grains. ? One-fourth of your plate is protein, especially lean meats, poultry, eggs, tofu, beans, or nuts.  Include low-fat dairy as part of your daily diet. Lifestyle  Choose healthy options in all settings, including home, work, school, restaurants, or stores.  Prepare your food safely: ? Wash your hands after handling raw meats. ? Keep food preparation surfaces clean by regularly washing with hot, soapy water. ? Keep raw meats separate from ready-to-eat foods, such as fruits and vegetables. ? Cook seafood, meat, poultry, and eggs to the recommended internal temperature. ? Store foods at safe temperatures. In general:  Keep cold foods at 59F (4.4C) or below.  Keep hot foods at 159F (60C) or above.  Keep your freezer at [facility] (-17.8C) or below.  Foods are no longer safe to eat when they have been between the temperatures of 40-159F (4.4-60C) for more than 2 hours. What foods should I eat? Fruits Aim to eat 2 cup-equivalents of fresh, canned (in natural juice), or frozen fruits each day. Examples of 1 cup-equivalent of fruit include 1 small apple, 8 large strawberries, 1 cup canned fruit,  cup  dried fruit, or 1 cup 100% juice. Vegetables Aim to eat 2-3 cup-equivalents of fresh and frozen vegetables each day, including different varieties and colors. Examples of 1 cup-equivalent of vegetables include 2 medium carrots, 2 cups raw, leafy greens, 1 cup chopped  vegetable (raw or cooked), or 1 medium baked potato. Grains Aim to eat 6 ounce-equivalents of whole grains each day. Examples of 1 ounce-equivalent of grains include 1 slice of bread, 1 cup ready-to-eat cereal, 3 cups popcorn, or  cup cooked rice, pasta, or cereal. Meats and other proteins Aim to eat 5-6 ounce-equivalents of protein each day. Examples of 1 ounce-equivalent of protein include 1 egg, 1/2 cup nuts or seeds, or 1 tablespoon (16 g) peanut butter. A cut of meat or fish that is the size of a deck of cards is about 3-4 ounce-equivalents.  Of the protein you eat each week, try to have at least 8 ounces come from seafood. This includes salmon, trout, herring, and anchovies. Dairy Aim to eat 3 cup-equivalents of fat-free or low-fat dairy each day. Examples of 1 cup-equivalent of dairy include 1 cup (240 mL) milk, 8 ounces (250 g) yogurt, 1 ounces (44 g) natural cheese, or 1 cup (240 mL) fortified soy milk. Fats and oils  Aim for about 5 teaspoons (21 g) per day. Choose monounsaturated fats, such as canola and olive oils, avocados, peanut butter, and most nuts, or polyunsaturated fats, such as sunflower, corn, and soybean oils, walnuts, pine nuts, sesame seeds, sunflower seeds, and flaxseed. Beverages  Aim for six 8-oz glasses of water per day. Limit coffee to three to five 8-oz cups per day.  Limit caffeinated beverages that have added calories, such as soda and energy drinks.  Limit alcohol intake to no more than 1 drink a day for nonpregnant women and 2 drinks a day for men. One drink equals 12 oz of beer (355 mL), 5 oz of wine (148 mL), or 1 oz of hard liquor (44 mL). Seasoning and other foods  Avoid adding excess amounts of salt to your foods. Try flavoring foods with herbs and spices instead of salt.  Avoid adding sugar to foods.  Try using oil-based dressings, sauces, and spreads instead of solid fats. This information is based on general [place] nutrition guidelines. For more  information, visit BuildDNA.es. Exact amounts may vary based on your nutrition needs. Summary  A healthy eating plan may help you to maintain a healthy weight, reduce the risk of chronic diseases, and stay active throughout your life.  Plan your meals. Make sure you eat the right portions of a variety of nutrient-rich foods.  Try baking, boiling, grilling, or broiling instead of frying.  Choose healthy options in all settings, including home, work, school, restaurants, or stores. This information is not intended to replace advice given to you by your health care provider. Make sure you discuss any questions you have with your health care provider. Document Revised: 08/07/2017 Document Reviewed: 08/07/2017 Elsevier Patient Education  [place].

## 2020-01-10 NOTE — Progress Notes (Signed)
[facility] Patient [facility] [address] [place], [place]  [address] Phone:  [phone]   Fax:  [phone]   Established Patient Office Visit  Subjective:  Patient ID: Christie Walker, female    DOB: 12/08/1970  Age: 49 y.o. MRN: [id number]  CC:  Chief Complaint  Patient presents with  . Follow-up    HPI Christie Walker presents for follow up. She  has a past medical history of Acid reflux, Bipolar 1 disorder (HCC), and Essential hypertension (06/14/2016).   Diabetes Mellitus Patient presents for follow up of prediabetes. Current symptoms include: none. Symptoms have stabilized. Patient denies foot ulcerations, hyperglycemia, hypoglycemia , nausea, paresthesia of the feet, polydipsia, polyuria, visual disturbances and vomiting. Evaluation to date has included: fasting blood sugar, fasting lipid panel and hemoglobin A1C.  Home sugars: patient does not check sugars. Current treatment: Continued metformin which has been unable to assess effectiveness. Last dilated eye exam: 2020.  Hypertension Patient is here for follow-up of elevated blood pressure. She is not exercising and is not adherent to a low-salt diet. Blood pressure is not monitored at home. Cardiac symptoms: none. Patient denies chest pain, dyspnea, fatigue, irregular heart beat, lower extremity edema and palpitations. Cardiovascular risk factors: hypertension, obesity (BMI >= 30 kg/m2) and sedentary lifestyle. Use of agents associated with hypertension: none. History of target organ damage: none.  Past Medical History:  Diagnosis Date  . Acid reflux   . Bipolar 1 disorder (HCC)   . Essential hypertension 06/14/2016    History reviewed. No pertinent surgical history.  Family History  Problem Relation Age of Onset  . Mental illness Other     Social History   Socioeconomic History  . Marital status: Legally Separated    Spouse name: Not on file  . Number of children: Not on file  . Years of  education: Not on file  . Highest education level: Not on file  Occupational History  . Not on file  Tobacco Use  . Smoking status: Never Smoker  . Smokeless tobacco: Never Used  Vaping Use  . Vaping Use: Never used  Substance and Sexual Activity  . Alcohol use: No  . Drug use: No  . Sexual activity: Not Currently    Birth control/protection: None  Other Topics Concern  . Not on file  Social History Narrative  . Not on file   Social Determinants of Health   Financial Resource Strain:   . Difficulty of Paying Living Expenses: Not on file  Food Insecurity:   . Worried About Programme researcher, broadcasting/film/video in the Last Year: Not on file  . Ran Out of Food in the Last Year: Not on file  Transportation Needs:   . Lack of Transportation (Medical): Not on file  . Lack of Transportation (Non-Medical): Not on file  Physical Activity:   . Days of Exercise per Week: Not on file  . Minutes of Exercise per Session: Not on file  Stress:   . Feeling of Stress : Not on file  Social Connections:   . Frequency of Communication with Friends and Family: Not on file  . Frequency of Social Gatherings with Friends and Family: Not on file  . Attends Religious Services: Not on file  . Active Member of Clubs or Organizations: Not on file  . Attends Banker Meetings: Not on file  . Marital Status: Not on file  Intimate Partner Violence:   . Fear of Current or Ex-Partner: Not on  file  . Emotionally Abused: Not on file  . Physically Abused: Not on file  . Sexually Abused: Not on file    Outpatient Medications Prior to Visit  Medication Sig Dispense Refill  . acetaminophen (TYLENOL) 325 MG tablet Take 650 mg by mouth every 6 (six) hours as needed for mild pain or headache.    . fluticasone (FLONASE) 50 MCG/ACT nasal spray Place 2 sprays into both nostrils daily. 9.9 g 0  . furosemide (LASIX) 20 MG tablet TAKE 1 TABLET(20 MG) BY MOUTH DAILY 30 tablet 5  . gabapentin (NEURONTIN) 100 MG capsule  Take 100 mg by mouth 2 (two) times daily.     . IRON PO Take 1 tablet by mouth daily.    Marland Kitchen lurasidone (LATUDA) 40 MG TABS tablet Take 40 mg by mouth daily.    . metFORMIN (GLUCOPHAGE) 500 MG tablet Take 1 tablet (500 mg total) by mouth daily with breakfast. 90 tablet 5  . Vitamin D, Ergocalciferol, (DRISDOL) 1.25 MG (50000 UNIT) CAPS capsule TAKE 1 CAPSULE BY MOUTH EVERY 7 DAYS (Patient taking differently: Take 50,000 Units by mouth every 7 (seven) days. TAKE 1 CAPSULE BY MOUTH EVERY 7 DAYS) 8 capsule 0  . albuterol (PROVENTIL HFA;VENTOLIN HFA) 108 (90 Base) MCG/ACT inhaler Inhale 2 puffs into the lungs every 6 (six) hours as needed for wheezing or shortness of breath. (Patient not taking: Reported on 01/10/2020)    . metoprolol tartrate (LOPRESSOR) 50 MG tablet Take 1 tablet (50 mg total) by mouth 2 (two) times daily. (Patient not taking: Reported on 01/10/2020) 60 tablet 1  . predniSONE (DELTASONE) 20 MG tablet Take 2 tablets (40 mg total) by mouth daily. Take 2 tablets (40mg ) daily x 2 days, then 1 tablet (20mg ) daily x 2 days then stop. (Patient not taking: Reported on 10/04/2019) 6 tablet 0  . senna-docusate (SENOKOT-S) 8.6-50 MG tablet Take 1 tablet by mouth 2 (two) times daily. (Patient not taking: Reported on 01/10/2020)     No facility-administered medications prior to visit.    Allergies  Allergen Reactions  . Bee Venom Swelling    Swelling at site   . Amoxicillin Hives and Other (See Comments)    Patient states that she can take this she just has a sensitivity when its taken along with pain medication  . Hydrocodone Hives and Other (See Comments)    Sensitivity when taken along with antibiotics per patient  . Insect Extract Allergy Skin Test Other (See Comments)    Spiders - make tongue burn  . Propoxyphene N-Acetaminophen Nausea And Vomiting and Rash    ROS Review of Systems  Constitutional: Negative.   HENT: Negative.   Eyes: Negative.   Respiratory: Negative.   Cardiovascular:  Negative.   Gastrointestinal: Negative.   Endocrine: Negative.   Genitourinary: Negative.   Musculoskeletal: Negative.   Skin: Negative.   Neurological: Negative.   Hematological: Negative.   Psychiatric/Behavioral: Negative.       Objective:    Physical Exam Vitals reviewed.  Constitutional:      Appearance: She is obese.  HENT:     Head: Normocephalic and atraumatic.     Nose: Nose normal.     Mouth/Throat:     Mouth: Mucous membranes are moist.  Cardiovascular:     Rate and Rhythm: Normal rate and regular rhythm.     Pulses: Normal pulses.     Heart sounds: Normal heart sounds.  Pulmonary:     Effort: Pulmonary effort is normal.  Breath sounds: Normal breath sounds.  Abdominal:     General: Bowel sounds are normal.     Palpations: Abdomen is soft.  Musculoskeletal:     Cervical back: Normal range of motion.  Skin:    General: Skin is warm and dry.     Capillary Refill: Capillary refill takes less than 2 seconds.  Neurological:     General: No focal deficit present.     Mental Status: She is alert and oriented to person, place, and time.  Psychiatric:        Mood and Affect: Mood normal.        Behavior: Behavior normal.        Thought Content: Thought content normal.        Judgment: Judgment normal.     BP 128/75   Pulse 83   Temp 97.8 F (36.6 C) (Temporal)   Ht 5' 4.5" (1.638 m)   Wt (!) 311 lb (141.1 kg)   SpO2 99%   BMI 52.56 kg/m  Wt Readings from Last 3 Encounters:  01/10/20 (!) 311 lb (141.1 kg)  10/04/19 (!) 305 lb (138.3 kg)  08/13/19 292 lb 12.3 oz (132.8 kg)     Health Maintenance Due  Topic Date Due  . Hepatitis C Screening  Never done  . PAP SMEAR-Modifier  09/16/2019    There are no preventive care reminders to display for this patient.  Lab Results  Component Value Date   TSH 1.080 10/04/2019   Lab Results  Component Value Date   WBC 4.8 01/10/2020   HGB 13.5 01/10/2020   HCT 42.7 01/10/2020   MCV 91 01/10/2020    PLT 218 01/10/2020   Lab Results  Component Value Date   NA 143 01/10/2020   K 3.7 01/10/2020   CO2 26 08/13/2019   GLUCOSE 130 (H) 01/10/2020   BUN 10 01/10/2020   CREATININE 0.76 01/10/2020   BILITOT 0.2 01/10/2020   ALKPHOS 82 01/10/2020   AST 9 01/10/2020   ALT 48 (H) 08/05/2019   PROT 6.4 01/10/2020   ALBUMIN 4.0 01/10/2020   CALCIUM 9.6 01/10/2020   ANIONGAP 10 08/13/2019   Lab Results  Component Value Date   CHOL 230 (H) 10/04/2019   Lab Results  Component Value Date   HDL 47 10/04/2019   Lab Results  Component Value Date   LDLCALC 156 (H) 10/04/2019   Lab Results  Component Value Date   TRIG 150 (H) 10/04/2019   Lab Results  Component Value Date   CHOLHDL 4.9 (H) 10/04/2019   Lab Results  Component Value Date   HGBA1C 5.8 (A) 01/10/2020   HGBA1C 5.8 01/10/2020   HGBA1C 5.8 01/10/2020   HGBA1C 5.8 01/10/2020      Assessment & Plan:   Problem List Items Addressed This Visit      Cardiovascular and Mediastinum   HTN (hypertension) Encouraged on going compliance with current medication regimen Encouraged home monitoring and recording BP <130/80 Eating a heart-healthy diet with less salt Encouraged regular physical activity  Recommend Weight loss       Other Visit Diagnoses    Body mass index (BMI) of 50-59.9 in adult (HCC)    -  Primary Obesity with BMI and comorbidities as noted above.  Discussed proper diet (low fat, low sodium, high fiber) with patient.   Discussed need for regular exercise (3 times per week, 20 minutes per session) with patient.    Elevated cholesterol  Prediabetes     Consider home glucose monitoring Weight loss at least 5% of current body weight is can be achieved with lifestyle modification dietary changes and regular daily exercise Encourage blood pressure control goal <120/80 and maintaining total cholesterol <200 Follow-up every 3 to 6 months for reevaluation   Relevant Orders   Microalbumin, urine  (Completed)   Comp. Metabolic Panel (12) (Completed)   POCT urinalysis dipstick (Completed)   POCT HgB A1C (Completed)   Anemia, unspecified type       Relevant Orders   CBC with Differential/Platelet (Completed)      No orders of the defined types were placed in this encounter.   Follow-up: Return in about 3 months (around 04/10/2020).    Barbette Merinorystal M Hanni Milford, NP

## 2020-01-11 LAB — COMP. METABOLIC PANEL (12)
AST: 9 IU/L (ref 0–40)
Albumin/Globulin Ratio: 1.7 (ref 1.2–2.2)
Albumin: 4 g/dL (ref 3.8–4.8)
Alkaline Phosphatase: 82 IU/L (ref 48–121)
BUN/Creatinine Ratio: 13 (ref 9–23)
BUN: 10 mg/dL (ref 6–24)
Bilirubin Total: 0.2 mg/dL (ref 0.0–1.2)
Calcium: 9.6 mg/dL (ref 8.7–10.2)
Chloride: 105 mmol/L (ref 96–106)
Creatinine, Ser: 0.76 mg/dL (ref 0.57–1.00)
GFR calc Af Amer: 108 mL/min/{1.73_m2} (ref >59)
GFR calc non Af Amer: 94 mL/min/{1.73_m2} (ref >59)
Globulin, Total: 2.4 g/dL (ref 1.5–4.5)
Glucose: 130 mg/dL — ABNORMAL HIGH (ref 65–99)
Potassium: 3.7 mmol/L (ref 3.5–5.2)
Sodium: 143 mmol/L (ref 134–144)
Total Protein: 6.4 g/dL (ref 6.0–8.5)

## 2020-01-11 LAB — CBC WITH DIFFERENTIAL/PLATELET
Basophils Absolute: 0 10*3/uL (ref 0.0–0.2)
Basos: 1 %
EOS (ABSOLUTE): 0.2 10*3/uL (ref 0.0–0.4)
Eos: 3 %
Hematocrit: 42.7 % (ref 34.0–46.6)
Hemoglobin: 13.5 g/dL (ref 11.1–15.9)
Immature Grans (Abs): 0 10*3/uL (ref 0.0–0.1)
Immature Granulocytes: 0 %
Lymphocytes Absolute: 1.8 10*3/uL (ref 0.7–3.1)
Lymphs: 37 %
MCH: 28.8 pg (ref 26.6–33.0)
MCHC: 31.6 g/dL (ref 31.5–35.7)
MCV: 91 fL (ref 79–97)
Monocytes Absolute: 0.3 10*3/uL (ref 0.1–0.9)
Monocytes: 6 %
Neutrophils Absolute: 2.5 10*3/uL (ref 1.4–7.0)
Neutrophils: 53 %
Platelets: 218 10*3/uL (ref 150–450)
RBC: 4.68 x10E6/uL (ref 3.77–5.28)
RDW: 14.1 % (ref 11.7–15.4)
WBC: 4.8 10*3/uL (ref 3.4–10.8)

## 2020-01-11 LAB — MICROALBUMIN, URINE: Microalbumin, Urine: 26.5 ug/mL

## 2020-02-20 ENCOUNTER — Other Ambulatory Visit: Payer: Self-pay

## 2020-02-20 ENCOUNTER — Encounter (HOSPITAL_COMMUNITY): Payer: Self-pay | Admitting: Psychiatry

## 2020-02-20 ENCOUNTER — Telehealth (INDEPENDENT_AMBULATORY_CARE_PROVIDER_SITE_OTHER): Payer: 59 | Admitting: Psychiatry

## 2020-02-20 DIAGNOSIS — F3181 Bipolar II disorder: Secondary | ICD-10-CM | POA: Diagnosis not present

## 2020-02-20 MED ORDER — GABAPENTIN 100 MG PO CAPS: 100.0000 mg | ORAL_CAPSULE | Freq: Two times a day (BID) | ORAL | 2 refills | Status: DC

## 2020-02-20 MED ORDER — LURASIDONE HCL 40 MG PO TABS: 40.0000 mg | ORAL_TABLET | Freq: Every day | ORAL | 2 refills | Status: DC

## 2020-02-20 NOTE — Progress Notes (Signed)
Psychiatric Initial Adult Assessment  Virtual Visit via Video Note  I connected with Christie Walker on 02/20/20 at  2:00 PM EDT by a video enabled telemedicine application and verified that I am speaking with the correct person using two identifiers.  Location: Patient: Home Provider: Clinic   I discussed the limitations of evaluation and management by telemedicine and the availability of in person appointments. The patient expressed understanding and agreed to proceed.  I provided 45 minutes of non-face-to-face time during this encounter.    Patient Identification: Christie Walker MRN:  [id number] Date of Evaluation:  02/20/2020 Referral Source: Vesta Mixer Chief Complaint: " Doing well on my current medication"  Visit Diagnosis:    ICD-10-CM   1. Bipolar 2 disorder (HCC)  F31.81 gabapentin (NEURONTIN) 100 MG capsule    lurasidone (LATUDA) 40 MG TABS tablet    History of Present Illness: 48 year old female seen today for initial psychiatric evaluation.  She was referred to outpatient psychiatry by [facility] for medication management.  She has a psychiatric history of bipolar disorder and and schizophrenia.  She is currently managed on twice daily and Latuda 40 mg daily.  She notes her medications are effective in managing her psychiatric conditions.  Today patient is pleasant, calm, cooperative, engaged in conversation, and maintains eye contact.  She denies symptoms of depression and anxiety.  She notes at times she has racing thoughts, impulsively drives fast on occasions, and becomes irritable when she becomes irritable with people.  She notes that she cannot remember when she had a manic episode and reports that she tries to walk away and she becomes overly irritable.  She denies SI/HI/VH or paranoia.  No medication adjustments made today.  Patient is agreeable to continue all medications as prescribed.  No other concerns noted at this time.  Associated  Signs/Symptoms: Depression Symptoms:  Denies (Hypo) Manic Symptoms:  Flight of Ideas, Impulsivity, Anxiety Symptoms:  Denies Psychotic Symptoms:  Denies PTSD Symptoms: NA  Past Psychiatric History: Bipolar disorder and schizophrenia  Previous Psychotropic Medications: No   Substance Abuse History in the last 12 months:  No.  Consequences of Substance Abuse: NA  Past Medical History:  Past Medical History:  Diagnosis Date  . Acid reflux   . Bipolar 1 disorder (HCC)   . Essential hypertension 06/14/2016   History reviewed. No pertinent surgical history.  Family Psychiatric History: Paternal aunts bipolar disorder and father has mental health conditions but unaware which one.   Family History:  Family History  Problem Relation Age of Onset  . Mental illness Other     Social History:   Social History   Socioeconomic History  . Marital status: Legally Separated    Spouse name: Not on file  . Number of children: Not on file  . Years of education: Not on file  . Highest education level: Not on file  Occupational History  . Not on file  Tobacco Use  . Smoking status: Never Smoker  . Smokeless tobacco: Never Used  Vaping Use  . Vaping Use: Never used  Substance and Sexual Activity  . Alcohol use: No  . Drug use: No  . Sexual activity: Not Currently    Birth control/protection: None  Other Topics Concern  . Not on file  Social History Narrative  . Not on file   Social Determinants of Health   Financial Resource Strain:   . Difficulty of Paying Living Expenses: Not on file  Food Insecurity:   . Worried About Running  Out of Food in the Last Year: Not on file  . Ran Out of Food in the Last Year: Not on file  Transportation Needs:   . Lack of Transportation (Medical): Not on file  . Lack of Transportation (Non-Medical): Not on file  Physical Activity:   . Days of Exercise per Week: Not on file  . Minutes of Exercise per Session: Not on file  Stress:   .  Feeling of Stress : Not on file  Social Connections:   . Frequency of Communication with Friends and Family: Not on file  . Frequency of Social Gatherings with Friends and Family: Not on file  . Attends Religious Services: Not on file  . Active Member of Clubs or Organizations: Not on file  . Attends Banker Meetings: Not on file  . Marital Status: Not on file    Additional Social History: Patient resides in [place]. She is single and has no children. 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 She denies tobacco, alcohol, or illegal drug use.   Allergies:   Allergies  Allergen Reactions  . Bee Venom Swelling    Swelling at site   . Amoxicillin Hives and Other (See Comments)    Patient states that she can take this she just has a sensitivity when its taken along with pain medication  . Hydrocodone Hives and Other (See Comments)    Sensitivity when taken along with antibiotics per patient  . Insect Extract Allergy Skin Test Other (See Comments)    Spiders - make tongue burn  . Propoxyphene N-Acetaminophen Nausea And Vomiting and Rash    Metabolic Disorder Labs: Lab Results  Component Value Date   HGBA1C 5.8 (A) 01/10/2020   HGBA1C 5.8 01/10/2020   HGBA1C 5.8 01/10/2020   HGBA1C 5.8 01/10/2020   MPG 139.85 08/06/2019   MPG 128.37 08/01/2019   No results found for: PROLACTIN Lab Results  Component Value Date   CHOL 230 (H) 10/04/2019   TRIG 150 (H) 10/04/2019   HDL 47 10/04/2019   CHOLHDL 4.9 (H) 10/04/2019   VLDL 32 (H) 06/14/2016   LDLCALC 156 (H) 10/04/2019   LDLCALC 126 (H) 06/14/2016   Lab Results  Component Value Date   TSH 1.080 10/04/2019    Therapeutic Level Labs: No results found for: LITHIUM No results found for: CBMZ No results found for: VALPROATE  Current Medications: Current Outpatient Medications  Medication Sig Dispense Refill  . acetaminophen (TYLENOL) 325 MG tablet Take 650 mg by mouth every 6 (six) hours as needed for  mild pain or headache.    . albuterol (PROVENTIL HFA;VENTOLIN HFA) 108 (90 Base) MCG/ACT inhaler Inhale 2 puffs into the lungs every 6 (six) hours as needed for wheezing or shortness of breath. (Patient not taking: Reported on 01/10/2020)    . fluticasone (FLONASE) 50 MCG/ACT nasal spray Place 2 sprays into both nostrils daily. 9.9 g 0  . furosemide (LASIX) 20 MG tablet TAKE 1 TABLET(20 MG) BY MOUTH DAILY 30 tablet 5  . gabapentin (NEURONTIN) 100 MG capsule Take 1 capsule (100 mg total) by mouth 2 (two) times daily. 60 capsule 2  . IRON PO Take 1 tablet by mouth daily.    Marland Kitchen lurasidone (LATUDA) 40 MG TABS tablet Take 1 tablet (40 mg total) by mouth daily. 30 tablet 2  . metFORMIN (GLUCOPHAGE) 500 MG tablet Take 1 tablet (500 mg total) by mouth daily with breakfast. 90 tablet 5  . metoprolol tartrate (LOPRESSOR) 50  MG tablet Take 1 tablet (50 mg total) by mouth 2 (two) times daily. (Patient not taking: Reported on 01/10/2020) 60 tablet 1   No current facility-administered medications for this visit.    Musculoskeletal: Strength & Muscle Tone: Unable to assess due to telehealth visit Gait & Station: Unable to assess due to telehealth visit Patient leans: N/A  Psychiatric Specialty Exam: Review of Systems  There were no vitals taken for this visit.There is no height or weight on file to calculate BMI.  General Appearance: Well Groomed  Eye Contact:  Good  Speech:  Clear and Coherent and Normal Rate  Volume:  Normal  Mood:  Euthymic  Affect:  Appropriate and Congruent  Thought Process:  Coherent, Goal Directed and Linear  Orientation:  Full (Time, Place, and Person)  Thought Content:  WDL and Logical  Suicidal Thoughts:  No  Homicidal Thoughts:  No  Memory:  Immediate;   Good Recent;   Good Remote;   Good  Judgement:  Good  Insight:  Good  Psychomotor Activity:  Normal  Concentration:  Concentration: Good and Attention Span: Good  Recall:  Good  Fund of Knowledge:Good  Language: Good   Akathisia:  No  Handed:  Right  AIMS (if indicated):  Not done  Assets:  Communication Skills Desire for Improvement Financial Resources/Insurance Housing Social Support  ADL's:  Intact  Cognition: WNL  Sleep:  Good   Screenings: PHQ2-9     Office Visit from 06/14/2019 in [facility] Office Visit from 12/14/2018 in [facility] Office Visit from 06/15/2018 in [facility] Office Visit from 12/08/2017 in [facility] Office Visit from 05/04/2017 in [facility]  PHQ-2 Total Score 0 0 0 0 0      Assessment and Plan: Patient notes that she is doing well on her current medication regimen.  No changes made today.  She will continue all medications as prescribed.  1. Bipolar 2 disorder (HCC)  Continue- gabapentin (NEURONTIN) 100 MG capsule; Take 1 capsule (100 mg total) by mouth 2 (two) times daily.  Dispense: 60 capsule; Refill: 2 Continue- lurasidone (LATUDA) 40 MG TABS tablet; Take 1 tablet (40 mg total) by mouth daily.  Dispense: 30 tablet; Refill: 2  Follow-up in 3 months    Shanna Cisco, NP 10/14/20212:20 PM

## 2020-03-06 ENCOUNTER — Telehealth (HOSPITAL_COMMUNITY): Payer: Self-pay | Admitting: *Deleted

## 2020-03-06 NOTE — Telephone Encounter (Signed)
Call from patient stating we called her Latuda in for the wrong dose, she has been taking 60 mg and we ordered 40 mg. Also, she is looking for her Melatonin. I told her is was over the counter but she states she has a dose that requires a rx. Called [facility] to confirm these two concerns but they are closed for lunch. Will leave this concern with Direce CMA. Spoke with Ms Doyne Keel NP and she said if [place] confirms the dose of Latuda and Melatonin she is fine with it being called in.

## 2020-04-06 ENCOUNTER — Other Ambulatory Visit: Payer: Self-pay | Admitting: Family Medicine

## 2020-04-06 DIAGNOSIS — I10 Essential (primary) hypertension: Secondary | ICD-10-CM

## 2020-04-06 DIAGNOSIS — R7303 Prediabetes: Secondary | ICD-10-CM

## 2020-04-09 ENCOUNTER — Other Ambulatory Visit: Payer: Self-pay | Admitting: Nurse Practitioner

## 2020-04-09 DIAGNOSIS — I10 Essential (primary) hypertension: Secondary | ICD-10-CM

## 2020-04-09 DIAGNOSIS — R7303 Prediabetes: Secondary | ICD-10-CM

## 2020-04-09 MED ORDER — METFORMIN HCL 500 MG PO TABS: 500.0000 mg | ORAL_TABLET | Freq: Every day | ORAL | 3 refills | Status: DC

## 2020-04-10 ENCOUNTER — Ambulatory Visit (INDEPENDENT_AMBULATORY_CARE_PROVIDER_SITE_OTHER): Payer: 59 | Admitting: Nurse Practitioner

## 2020-04-10 ENCOUNTER — Encounter: Payer: Self-pay | Admitting: Nurse Practitioner

## 2020-04-10 ENCOUNTER — Other Ambulatory Visit: Payer: Self-pay

## 2020-04-10 VITALS — BP 132/81 | HR 79 | Temp 98.1°F | Resp 18 | Ht 65.0 in | Wt 322.4 lb

## 2020-04-10 DIAGNOSIS — Z13220 Encounter for screening for lipoid disorders: Secondary | ICD-10-CM | POA: Diagnosis not present

## 2020-04-10 DIAGNOSIS — R7303 Prediabetes: Secondary | ICD-10-CM

## 2020-04-10 DIAGNOSIS — E785 Hyperlipidemia, unspecified: Secondary | ICD-10-CM

## 2020-04-10 DIAGNOSIS — I10 Essential (primary) hypertension: Secondary | ICD-10-CM | POA: Diagnosis not present

## 2020-04-10 DIAGNOSIS — Z6841 Body Mass Index (BMI) 40.0 and over, adult: Secondary | ICD-10-CM

## 2020-04-10 LAB — POCT URINALYSIS DIPSTICK
Bilirubin, UA: NEGATIVE
Blood, UA: NEGATIVE
Glucose, UA: NEGATIVE
Ketones, UA: NEGATIVE
Leukocytes, UA: NEGATIVE
Nitrite, UA: NEGATIVE
Protein, UA: NEGATIVE
Spec Grav, UA: >=1.03 — AB (ref 1.010–1.025)
Urobilinogen, UA: 0.2 E.U./dL
pH, UA: 6.5 (ref 5.0–8.0)

## 2020-04-10 NOTE — Patient Instructions (Addendum)
Insulin Resistance  Insulin is a hormone that helps to control blood sugar (glucose) levels in the body. It is made in the pancreas. Insulin allows glucose to enter cells in the body. Insulin sensitivity refers to how the body responds to insulin. Insulin resistance occurs when cells in the body do not respond properly to insulin made by the pancreas and are not able to absorb glucose from the bloodstream. Insulin resistance results in high blood glucose levels (hyperglycemia) and can lead to problems, including:  Prediabetes.  Type 2 diabetes (type 2 diabetes mellitus).  Heart disease.  High blood pressure (hypertension).  Stroke.  Polycystic ovary syndrome (PCOS).  Nonalcoholic fatty liver disease. What are the causes? The exact cause of insulin resistance is not known. What increases the risk? The following factors may make you more likely to develop insulin resistance:  Being overweight or obese, especially if a lot of your weight is in your waist area.  Having an inactive (sedentary) lifestyle.  Using steroids.  Being older than age 45.  Having sleep apnea.  Using tobacco products. What are the signs or symptoms? This condition usually does not cause symptoms. How is this diagnosed? There is no test to diagnose insulin resistance. However, your health care provider may diagnose insulin resistance based on:  Your blood glucose levels.  Your cholesterol levels.  A measurement of the distance around your waist (circumference). A waist circumference of more than 35 inches (88.9 cm) for women and more than 40 inches (101.6 cm) for men may be a sign of insulin resistance.  Your risk factors.  A physical exam.  Your medical history. How is this treated? Insulin resistance is treated with nutrition and lifestyle changes. These changes may include:  Eating a healthy balance of nutritious foods.  Getting more physical activity.  Maintaining a healthy  weight.  Stopping the use of any tobacco products. Your health care provider will work with you to change your nutrition and lifestyle as needed. In some cases, treatment may also include medicine to improve your insulin sensitivity. Follow these instructions at home: Activity  Be physically active. Do moderate-intensity physical activity for 30 minutes or more on 5 or more days of the week, or as much as told by your health care provider. This could be brisk walking, biking, or water aerobics.  Ask your health care provider what activities are safe for you. A mix of physical activities may be best, such as walking, swimming, cycling, and strength training. Eating and drinking   Follow a healthy meal plan. This includes eating lean proteins, complex carbohydrates, fresh fruits and vegetables, low-fat dairy products, and healthy fats.  Follow instructions from your health care provider about eating or drinking restrictions.  Make an appointment to see a diet and nutrition specialist (registered dietitian) to help you create a healthy eating plan. General instructions  Check your blood glucose levels as told by your health care provider.  Take over-the-counter and prescription medicines only as told by your health care provider.  Lose weight as told by your health care provider. ? Losing 5-7% of your body weight can reverse insulin resistance. ? Your health care provider can determine how much weight loss is best for you and can help you lose weight safely.  Do not use any tobacco products, such as cigarettes, chewing tobacco, and e-cigarettes. If you need help quitting, ask your health care provider.  Keep all follow-up visits as told by your health care provider. This is important. Contact   a health care provider if:  You have trouble losing weight or maintaining your goal weight.  You gain weight.  You have trouble following your prescribed meal plan.  You have trouble  exercising more. Summary  Insulin resistance occurs when cells in the body do not respond properly to insulin made by the pancreas and are not able to absorb blood sugar (glucose) from the bloodstream.  Your health care provider will work with you to change your nutrition and lifestyle as needed. Treatment may also include medicine to improve your insulin sensitivity.  Keep all follow-up visits as told by your health care provider. This is important. This information is not intended to replace advice given to you by your health care provider. Make sure you discuss any questions you have with your health care provider. Document Revised: 04/07/2017 Document Reviewed: 05/29/2015 Elsevier Patient Education  2020 [employer]  Prediabetes Eating Plan Prediabetes is a condition that causes blood sugar (glucose) levels to be higher than normal. This increases the risk for developing diabetes. In order to prevent diabetes from developing, your health care provider may recommend a diet and other lifestyle changes to help you:  Control your blood glucose levels.  Improve your cholesterol levels.  Manage your blood pressure. Your health care provider may recommend working with a diet and nutrition specialist (dietitian) to make a meal plan that is best for you. What are tips for following this plan? Lifestyle  Set weight loss goals with the help of your health care team. It is recommended that most people with prediabetes lose 7% of their current body weight.  Exercise for at least 30 minutes at least 5 days a week.  Attend a support group or seek ongoing support from a mental health counselor.  Take over-the-counter and prescription medicines only as told by your health care provider. Reading food labels  Read food labels to check the amount of fat, salt (sodium), and sugar in prepackaged foods. Avoid foods that have: ? Saturated fats. ? Trans fats. ? Added sugars.  Avoid foods that  have more than 300 milligrams (mg) of sodium per serving. Limit your daily sodium intake to less than 2,300 mg each day. Shopping  Avoid buying pre-made and processed foods. Cooking  Cook with olive oil. Do not use butter, lard, or ghee.  Bake, broil, grill, or boil foods. Avoid frying. Meal planning   Work with your dietitian to develop an eating plan that is right for you. This may include: ? Tracking how many calories you take in. Use a food diary, notebook, or mobile application to track what you eat at each meal. ? Using the glycemic index (GI) to plan your meals. The index tells you how quickly a food will raise your blood glucose. Choose low-GI foods. These foods take a longer time to raise blood glucose.  Consider following a Mediterranean diet. This diet includes: ? Several servings each day of fresh fruits and vegetables. ? Eating fish at least twice a week. ? Several servings each day of whole grains, beans, nuts, and seeds. ? Using olive oil instead of other fats. ? Moderate alcohol consumption. ? Eating small amounts of red meat and whole-fat dairy.  If you have high blood pressure, you may need to limit your sodium intake or follow a diet such as the DASH eating plan. DASH is an eating plan that aims to lower high blood pressure.   What foods are recommended? The items listed below may not be a  complete list. Talk with your dietitian about what dietary choices are best for you. Grains Whole grains, such as whole-wheat or whole-grain breads, crackers, cereals, and pasta. Unsweetened oatmeal. Bulgur. Barley. Quinoa. Brown rice. Corn or whole-wheat flour tortillas or taco shells. Vegetables Lettuce. Spinach. Peas. Beets. Cauliflower. Cabbage. Broccoli. Carrots. Tomatoes. Squash. Eggplant. Herbs. Peppers. Onions. Cucumbers. Brussels sprouts. Fruits Berries. Bananas. Apples. Oranges. Grapes. Papaya. Mango. Pomegranate. Kiwi. Grapefruit. Cherries. Meats and other protein  foods Seafood. Poultry without skin. Lean cuts of pork and beef. Tofu. Eggs. Nuts. Beans. Dairy Low-fat or fat-free dairy products, such as yogurt, cottage cheese, and cheese. Beverages Water. Tea. Coffee. Sugar-free or diet soda. Seltzer water. Lowfat or no-fat milk. Milk alternatives, such as soy or almond milk. Fats and oils Olive oil. Canola oil. Sunflower oil. Grapeseed oil. Avocado. Walnuts. Sweets and desserts Sugar-free or low-fat pudding. Sugar-free or low-fat ice cream and other frozen treats. Seasoning and other foods Herbs. Sodium-free spices. Mustard. Relish. Low-fat, low-sugar ketchup. Low-fat, low-sugar barbecue sauce. Low-fat or fat-free mayonnaise.   What foods are not recommended? The items listed below may not be a complete list. Talk with your dietitian about what dietary choices are best for you. Grains Refined white flour and flour products, such as bread, pasta, snack foods, and cereals. Vegetables Canned vegetables. Frozen vegetables with butter or cream sauce. Fruits Fruits canned with syrup. Meats and other protein foods Fatty cuts of meat. Poultry with skin. Breaded or fried meat. Processed meats. Dairy Full-fat yogurt, cheese, or milk. Beverages Sweetened drinks, such as sweet iced tea and soda. Fats and oils Butter. Lard. Ghee. Sweets and desserts Baked goods, such as cake, cupcakes, pastries, cookies, and cheesecake. Seasoning and other foods Spice mixes with added salt. Ketchup. Barbecue sauce. Mayonnaise. Summary  To prevent diabetes from developing, you may need to make diet and other lifestyle changes to help control blood sugar, improve cholesterol levels, and manage your blood pressure.  Set weight loss goals with the help of your health care team. It is recommended that most people with prediabetes lose 7 percent of their current body weight.  Consider following a Mediterranean diet that includes plenty of fresh fruits and vegetables, whole  grains, beans, nuts, seeds, fish, lean meat, low-fat dairy, and healthy oils. This information is not intended to replace advice given to you by your health care provider. Make sure you discuss any questions you have with your health care provider. Document Revised: 08/17/2018 Document Reviewed: 06/29/2016 Elsevier Patient Education  2020 [employer].

## 2020-04-10 NOTE — Progress Notes (Signed)
[facility] Patient [facility] [address] [place], [place]  [address] Phone:  [phone]   Fax:  [phone]   Established Patient Office Visit  Subjective:  Patient ID: Christie Walker, female    DOB: [birth year]  Age: 49 y.o. MRN: [id number]  CC:  Chief Complaint  Patient presents with  . Follow-up    HPI Christie Walker Mccart presents for follow up. She  has a past medical history of Acid reflux, Bipolar 1 disorder (HCC), and Essential hypertension (06/14/2016).   Hypertension Patient is here for follow-up of elevated blood pressure. She is not exercising and is adherent to a low-salt diet. Blood pressure is well controlled at home. Cardiac symptoms: none. Patient denies chest pain, exertional chest pressure/discomfort, fatigue, irregular heart beat, lower extremity edema, orthopnea, paroxysmal nocturnal dyspnea, syncope and tachypnea. Cardiovascular risk factors: dyslipidemia, hypertension, obesity (BMI >= 30 kg/m2) and sedentary lifestyle. Use of agents associated with hypertension: none. History of target organ damage: none.  She is a little upset this morning because she wants to get her Covid vaccine.  She was turned away at [facility] due to needing appointment.  Past Medical History:  Diagnosis Date  . Acid reflux   . Bipolar 1 disorder (HCC)   . Essential hypertension 06/14/2016    History reviewed. No pertinent surgical history.  Family History  Problem Relation Age of Onset  . Mental illness Other     Social History   Socioeconomic History  . Marital status: Legally Separated    Spouse name: Not on file  . Number of children: Not on file  . Years of education: Not on file  . Highest education level: Not on file  Occupational History  . Not on file  Tobacco Use  . Smoking status: Never Smoker  . Smokeless tobacco: Never Used  Vaping Use  . Vaping Use: Never used  Substance and Sexual Activity  . Alcohol use: No  . Drug use: No  . Sexual  activity: Not Currently    Birth control/protection: None  Other Topics Concern  . Not on file  Social History Narrative  . Not on file   Social Determinants of Health   Financial Resource Strain:   . Difficulty of Paying Living Expenses: Not on file  Food Insecurity:   . Worried About Programme researcher, broadcasting/film/video in the Last Year: Not on file  . Ran Out of Food in the Last Year: Not on file  Transportation Needs:   . Lack of Transportation (Medical): Not on file  . Lack of Transportation (Non-Medical): Not on file  Physical Activity:   . Days of Exercise per Week: Not on file  . Minutes of Exercise per Session: Not on file  Stress:   . Feeling of Stress : Not on file  Social Connections:   . Frequency of Communication with Friends and Family: Not on file  . Frequency of Social Gatherings with Friends and Family: Not on file  . Attends Religious Services: Not on file  . Active Member of Clubs or Organizations: Not on file  . Attends Banker Meetings: Not on file  . Marital Status: Not on file  Intimate Partner Violence:   . Fear of Current or Ex-Partner: Not on file  . Emotionally Abused: Not on file  . Physically Abused: Not on file  . Sexually Abused: Not on file    Outpatient Medications Prior to Visit  Medication Sig Dispense Refill  . acetaminophen (TYLENOL)  325 MG tablet Take 650 mg by mouth every 6 (six) hours as needed for mild pain or headache.    . fluticasone (FLONASE) 50 MCG/ACT nasal spray Place 2 sprays into both nostrils daily. 9.9 g 0  . furosemide (LASIX) 20 MG tablet TAKE 1 TABLET(20 MG) BY MOUTH DAILY 30 tablet 5  . gabapentin (NEURONTIN) 100 MG capsule Take 1 capsule (100 mg total) by mouth 2 (two) times daily. 60 capsule 2  . IRON PO Take 1 tablet by mouth daily.    Marland Kitchen lurasidone (LATUDA) 40 MG TABS tablet Take 1 tablet (40 mg total) by mouth daily. 30 tablet 2  . metFORMIN (GLUCOPHAGE) 500 MG tablet Take 1 tablet (500 mg total) by mouth daily with  breakfast. 90 tablet 3  . albuterol (PROVENTIL HFA;VENTOLIN HFA) 108 (90 Base) MCG/ACT inhaler Inhale 2 puffs into the lungs every 6 (six) hours as needed for wheezing or shortness of breath. (Patient not taking: Reported on 01/10/2020)    . metoprolol tartrate (LOPRESSOR) 50 MG tablet Take 1 tablet (50 mg total) by mouth 2 (two) times daily. (Patient not taking: Reported on 01/10/2020) 60 tablet 1   No facility-administered medications prior to visit.    Allergies  Allergen Reactions  . Bee Venom Swelling    Swelling at site   . Amoxicillin Hives and Other (See Comments)    Patient states that she can take this she just has a sensitivity when its taken along with pain medication  . Hydrocodone Hives and Other (See Comments)    Sensitivity when taken along with antibiotics per patient  . Insect Extract Allergy Skin Test Other (See Comments)    Spiders - make tongue burn  . Propoxyphene N-Acetaminophen Nausea And Vomiting and Rash    ROS Review of Systems  All other systems reviewed and are negative.     Objective:    Physical Exam Constitutional:      General: She is not in acute distress.    Appearance: She is obese. She is not ill-appearing, toxic-appearing or diaphoretic.  HENT:     Head: Normocephalic and atraumatic.     Nose: Nose normal.     Mouth/Throat:     Mouth: Mucous membranes are moist.  Cardiovascular:     Rate and Rhythm: Normal rate and regular rhythm.     Pulses: Normal pulses.     Heart sounds: Normal heart sounds.  Pulmonary:     Effort: Pulmonary effort is normal.     Breath sounds: Normal breath sounds.  Abdominal:     General: Bowel sounds are normal.     Palpations: Abdomen is soft.     Comments: Increased abdominal girth  Musculoskeletal:     Cervical back: Normal range of motion.  Skin:    General: Skin is warm and dry.     Capillary Refill: Capillary refill takes less than 2 seconds.  Neurological:     General: No focal deficit present.      Mental Status: She is alert and oriented to person, place, and time.  Psychiatric:        Mood and Affect: Mood normal.        Behavior: Behavior normal.        Thought Content: Thought content normal.        Judgment: Judgment normal.     BP 132/81 (BP Location: Right Arm, Patient Position: Sitting, Cuff Size: Large)   Pulse 79   Temp 98.1 F (36.7 C) (Temporal)  Resp 18   Ht 5\' 5"  (1.651 m)   Wt (!) 322 lb 6.4 oz (146.2 kg)   SpO2 97%   BMI 53.65 kg/m  Wt Readings from Last 3 Encounters:  04/10/20 (!) 322 lb 6.4 oz (146.2 kg)  01/10/20 (!) 311 lb (141.1 kg)  10/04/19 (!) 305 lb (138.3 kg)     There are no preventive care reminders to display for this patient.  There are no preventive care reminders to display for this patient.  Lab Results  Component Value Date   TSH 1.080 10/04/2019   Lab Results  Component Value Date   WBC 4.8 01/10/2020   HGB 13.5 01/10/2020   HCT 42.7 01/10/2020   MCV 91 01/10/2020   PLT 218 01/10/2020   Lab Results  Component Value Date   NA 141 04/10/2020   K 4.1 04/10/2020   CO2 26 08/13/2019   GLUCOSE 99 04/10/2020   BUN 10 04/10/2020   CREATININE 0.80 04/10/2020   BILITOT 0.4 04/10/2020   ALKPHOS 96 04/10/2020   AST 15 04/10/2020   ALT 48 (H) 08/05/2019   PROT 6.6 04/10/2020   ALBUMIN 4.2 04/10/2020   CALCIUM 9.3 04/10/2020   ANIONGAP 10 08/13/2019   Lab Results  Component Value Date   CHOL 248 (H) 04/10/2020   Lab Results  Component Value Date   HDL 52 04/10/2020   Lab Results  Component Value Date   LDLCALC 167 (H) 04/10/2020   Lab Results  Component Value Date   TRIG 157 (H) 04/10/2020   Lab Results  Component Value Date   CHOLHDL 4.8 (H) 04/10/2020   Lab Results  Component Value Date   HGBA1C 5.8 (A) 01/10/2020   HGBA1C 5.8 01/10/2020   HGBA1C 5.8 01/10/2020   HGBA1C 5.8 01/10/2020      Assessment & Plan:   Problem List Items Addressed This Visit      Other   OBESITY Obesity with BMI and  comorbidities as noted above.  Discussed proper diet (low fat, low sodium, high fiber) with patient.   Discussed need for regular exercise (3 times per week, 20 minutes per session) with patient.     Other Visit Diagnoses    Essential hypertension    -  Primary Encouraged on going compliance with current medication regimen Encouraged home monitoring and recording BP <130/80 Eating a heart-healthy diet with less salt Encouraged regular physical activity  Recommend Weight loss       Relevant Orders   Comp. Metabolic Panel (12) (Completed)   Prediabetes    Consider home glucose monitoring Weight loss at least 5% of current body weight is can be achieved with lifestyle modification dietary changes and regular daily exercise Encourage blood pressure control goal <120/80 and maintaining total cholesterol <200 Follow-up every 3 to 6 months for reevaluation    Relevant Orders   Comp. Metabolic Panel (12) (Completed)   POCT urinalysis dipstick (Completed)   Screening for cholesterol level       Relevant Orders   Lipid panel (Completed)   Dyslipidemia with high LDL and low HDL       Relevant Orders   Lipid panel (Completed)  Patient was directed to the [facility] for 1-5 today and on Saturday 9-1 for Covid vaccination    No orders of the defined types were placed in this encounter.   Follow-up: Return in about 3 months (around 07/09/2020).    09/08/2020, NP

## 2020-04-11 LAB — COMP. METABOLIC PANEL (12)
AST: 15 IU/L (ref 0–40)
Albumin/Globulin Ratio: 1.8 (ref 1.2–2.2)
Albumin: 4.2 g/dL (ref 3.8–4.8)
Alkaline Phosphatase: 96 IU/L (ref 44–121)
BUN/Creatinine Ratio: 13 (ref 9–23)
BUN: 10 mg/dL (ref 6–24)
Bilirubin Total: 0.4 mg/dL (ref 0.0–1.2)
Calcium: 9.3 mg/dL (ref 8.7–10.2)
Chloride: 104 mmol/L (ref 96–106)
Creatinine, Ser: 0.8 mg/dL (ref 0.57–1.00)
GFR calc Af Amer: 101 mL/min/{1.73_m2} (ref >59)
GFR calc non Af Amer: 87 mL/min/{1.73_m2} (ref >59)
Globulin, Total: 2.4 g/dL (ref 1.5–4.5)
Glucose: 99 mg/dL (ref 65–99)
Potassium: 4.1 mmol/L (ref 3.5–5.2)
Sodium: 141 mmol/L (ref 134–144)
Total Protein: 6.6 g/dL (ref 6.0–8.5)

## 2020-04-11 LAB — LIPID PANEL
Chol/HDL Ratio: 4.8 ratio — ABNORMAL HIGH (ref 0.0–4.4)
Cholesterol, Total: 248 mg/dL — ABNORMAL HIGH (ref 100–199)
HDL: 52 mg/dL (ref >39)
LDL Chol Calc (NIH): 167 mg/dL — ABNORMAL HIGH (ref 0–99)
Triglycerides: 157 mg/dL — ABNORMAL HIGH (ref 0–149)
VLDL Cholesterol Cal: 29 mg/dL (ref 5–40)

## 2020-05-22 ENCOUNTER — Other Ambulatory Visit: Payer: Self-pay

## 2020-05-22 ENCOUNTER — Encounter (HOSPITAL_COMMUNITY): Payer: Self-pay | Admitting: Psychiatry

## 2020-05-22 ENCOUNTER — Telehealth (INDEPENDENT_AMBULATORY_CARE_PROVIDER_SITE_OTHER): Payer: 59 | Admitting: Psychiatry

## 2020-05-22 DIAGNOSIS — F3181 Bipolar II disorder: Secondary | ICD-10-CM

## 2020-05-22 MED ORDER — LURASIDONE HCL 60 MG PO TABS: 60.0000 mg | ORAL_TABLET | Freq: Every day | ORAL | 2 refills | Status: DC

## 2020-05-22 MED ORDER — GABAPENTIN 100 MG PO CAPS: 100.0000 mg | ORAL_CAPSULE | Freq: Two times a day (BID) | ORAL | 2 refills | Status: DC

## 2020-05-22 MED ORDER — MELATONIN 5 MG PO TABS: 5.0000 mg | ORAL_TABLET | Freq: Every evening | ORAL | 2 refills | Status: DC | PRN

## 2020-05-22 NOTE — Progress Notes (Signed)
BH MD/PA/NP OP Progress Note Virtual Visit via Telephone Note  I connected with Christie Walker on 05/22/20 at 10:00 AM EST by telephone and verified that I am speaking with the correct person using two identifiers.  Location: Patient: home Provider: Clinic   I discussed the limitations, risks, security and privacy concerns of performing an evaluation and management service by telephone and the availability of in person appointments. I also discussed with the patient that there may be a patient responsible charge related to this service. The patient expressed understanding and agreed to proceed.   I provided 22 minutes of non-face-to-face time during this encounter.   05/22/2020 10:21 AM Christie Walker  MRN:  [id number]  Chief Complaint: "Things have been going well.   HPI:  50 year old female seen today for follow up psychiatric evaluation. She has a psychiatric history of bipolar disorder and and schizophrenia.  She is currently managed on gabapentin 100 mg twice daily, Melatonin 5 mg nightly, and Latuda 60 mg daily.  She notes her medications are effective in managing her psychiatric conditions.  Today patient is pleasant, calm, cooperative, and engaged in conversation. Patient unable to login virtually so her exam was done over the phone. She informed provider that things have been going well and denies feeling anxious or depressed. Provider conducted a GAD 7 and patient scored a 2. Provider also conducted a PHQ 9 and patient scored a 1. She notes that her mood has been stable and denies SI/HI/VAH, mania, or paranoia.   Patient reports that most nights she gets 7-8 hours of sleep but notes occasionally she has difficulty sleeping. She is agreeable to increase melatonin 5 mg to 5-10 mg as needed for sleep. She will  continue all other medications as prescribed.  No other concerns noted at this time.  Visit Diagnosis:    ICD-10-CM   1. Bipolar 2 disorder (HCC)  F31.81  gabapentin (NEURONTIN) 100 MG capsule    lurasidone 60 MG TABS    melatonin 5 MG TABS    Past Psychiatric History:  bipolar disorder and and schizophrenia  Past Medical History:  Past Medical History:  Diagnosis Date  . Acid reflux   . Bipolar 1 disorder (HCC)   . Essential hypertension 06/14/2016   History reviewed. No pertinent surgical history.  Family Psychiatric History: Paternal aunts bipolar disorder and father has mental health conditions but unaware which one.   Family History:  Family History  Problem Relation Age of Onset  . Mental illness Other     Social History:  Social History   Socioeconomic History  . Marital status: Legally Separated    Spouse name: Not on file  . Number of children: Not on file  . Years of education: Not on file  . Highest education level: Not on file  Occupational History  . Not on file  Tobacco Use  . Smoking status: Never Smoker  . Smokeless tobacco: Never Used  Vaping Use  . Vaping Use: Never used  Substance and Sexual Activity  . Alcohol use: No  . Drug use: No  . Sexual activity: Not Currently    Birth control/protection: None  Other Topics Concern  . Not on file  Social History Narrative  . Not on file   Social Determinants of Health   Financial Resource Strain: Not on file  Food Insecurity: Not on file  Transportation Needs: Not on file  Physical Activity: Not on file  Stress: Not on file  Social Connections: Not  on file    Allergies:  Allergies  Allergen Reactions  . Bee Venom Swelling    Swelling at site   . Amoxicillin Hives and Other (See Comments)    Patient states that she can take this she just has a sensitivity when its taken along with pain medication  . Hydrocodone Hives and Other (See Comments)    Sensitivity when taken along with antibiotics per patient  . Insect Extract Allergy Skin Test Other (See Comments)    Spiders - make tongue burn  . Propoxyphene N-Acetaminophen Nausea And Vomiting and  Rash    Metabolic Disorder Labs: Lab Results  Component Value Date   HGBA1C 5.8 (A) 01/10/2020   HGBA1C 5.8 01/10/2020   HGBA1C 5.8 01/10/2020   HGBA1C 5.8 01/10/2020   MPG 139.85 08/06/2019   MPG 128.37 08/01/2019   No results found for: PROLACTIN Lab Results  Component Value Date   CHOL 248 (H) 04/10/2020   TRIG 157 (H) 04/10/2020   HDL 52 04/10/2020   CHOLHDL 4.8 (H) 04/10/2020   VLDL 32 (H) 06/14/2016   LDLCALC 167 (H) 04/10/2020   LDLCALC 156 (H) 10/04/2019   Lab Results  Component Value Date   TSH 1.080 10/04/2019   TSH 0.262 (L) 08/06/2019    Therapeutic Level Labs: No results found for: LITHIUM No results found for: VALPROATE No components found for:  CBMZ  Current Medications: Current Outpatient Medications  Medication Sig Dispense Refill  . melatonin 5 MG TABS Take 1 tablet (5 mg total) by mouth at bedtime as needed. 60 tablet 2  . acetaminophen (TYLENOL) 325 MG tablet Take 650 mg by mouth every 6 (six) hours as needed for mild pain or headache.    . albuterol (PROVENTIL HFA;VENTOLIN HFA) 108 (90 Base) MCG/ACT inhaler Inhale 2 puffs into the lungs every 6 (six) hours as needed for wheezing or shortness of breath. (Patient not taking: Reported on 01/10/2020)    . fluticasone (FLONASE) 50 MCG/ACT nasal spray Place 2 sprays into both nostrils daily. 9.9 g 0  . furosemide (LASIX) 20 MG tablet TAKE 1 TABLET(20 MG) BY MOUTH DAILY 30 tablet 5  . gabapentin (NEURONTIN) 100 MG capsule Take 1 capsule (100 mg total) by mouth 2 (two) times daily. 60 capsule 2  . IRON PO Take 1 tablet by mouth daily.    Marland Kitchen lurasidone 60 MG TABS Take 1 tablet (60 mg total) by mouth daily. 30 tablet 2  . metFORMIN (GLUCOPHAGE) 500 MG tablet Take 1 tablet (500 mg total) by mouth daily with breakfast. 90 tablet 3  . metoprolol tartrate (LOPRESSOR) 50 MG tablet Take 1 tablet (50 mg total) by mouth 2 (two) times daily. (Patient not taking: Reported on 01/10/2020) 60 tablet 1   No current  facility-administered medications for this visit.     Musculoskeletal: Strength & Muscle Tone: Unable to assess due to telephone visit Gait & Station: Unable to assess due to telephone visit Patient leans: N/A  Psychiatric Specialty Exam: Review of Systems  There were no vitals taken for this visit.There is no height or weight on file to calculate BMI.  General Appearance: Unable to assess due to telephone visit  Eye Contact:  Unable to assess due to telephone visit  Speech:  Clear and Coherent and Normal Rate  Volume:  Normal  Mood:  Euthymic  Affect:  Appropriate and Congruent  Thought Process:  Coherent, Goal Directed and Linear  Orientation:  Full (Time, Place, and Person)  Thought Content: WDL and  Logical   Suicidal Thoughts:  No  Homicidal Thoughts:  No  Memory:  Immediate;   Good Recent;   Good Remote;   Good  Judgement:  Good  Insight:  Good  Psychomotor Activity:  Normal  Concentration:  Concentration: Good and Attention Span: Good  Recall:  Good  Fund of Knowledge: Good  Language: Good  Akathisia:  No  Handed:  Right  AIMS (if indicated): Not done  Assets:  Communication Skills Desire for Improvement Financial Resources/Insurance Housing Social Support  ADL's:  Intact  Cognition: WNL  Sleep:  Good   Screenings: GAD-7   Flowsheet Row Video Visit from 05/22/2020 in [facility] Office Visit from 04/10/2020 in [facility]  Total GAD-7 Score 2 0    PHQ2-9   Flowsheet Row Video Visit from 05/22/2020 in [facility] Office Visit from 04/10/2020 in [facility] Office Visit from 06/14/2019 in [facility] Office Visit from 12/14/2018 in [facility] Office Visit from 06/15/2018 in [facility]  PHQ-2 Total Score 0 0 0 0 0  PHQ-9 Total Score 1 -- -- -- --       Assessment and Plan: Patient notes that over all she is  doing well.  She also reports that most nights she gets 7-8 hours of sleep but notes occasionally she has difficulty sleeping. She is agreeable to increase melatonin 5 mg to 5-10 mg as needed for sleep. She will  continue all other medications as prescribed.   1. Bipolar 2 disorder (HCC)  Continue- gabapentin (NEURONTIN) 100 MG capsule; Take 1 capsule (100 mg total) by mouth 2 (two) times daily.  Dispense: 60 capsule; Refill: 2 Continue- lurasidone 60 MG TABS; Take 1 tablet (60 mg total) by mouth daily.  Dispense: 30 tablet; Refill: 2 Increased- melatonin 5 MG TABS; Take 1 tablet (5 mg total) by mouth at bedtime as needed.  Dispense: 60 tablet; Refill: 2  Shanna Cisco, NP  05/22/2020, 10:21 AM

## 2020-06-17 ENCOUNTER — Other Ambulatory Visit: Payer: Self-pay | Admitting: Nurse Practitioner

## 2020-06-17 DIAGNOSIS — R600 Localized edema: Secondary | ICD-10-CM

## 2020-06-17 DIAGNOSIS — I10 Essential (primary) hypertension: Secondary | ICD-10-CM

## 2020-06-17 NOTE — Telephone Encounter (Signed)
Please see refill request.

## 2020-07-09 ENCOUNTER — Ambulatory Visit (INDEPENDENT_AMBULATORY_CARE_PROVIDER_SITE_OTHER): Payer: 59 | Admitting: Nurse Practitioner

## 2020-07-09 ENCOUNTER — Encounter: Payer: Self-pay | Admitting: Nurse Practitioner

## 2020-07-09 ENCOUNTER — Other Ambulatory Visit: Payer: Self-pay

## 2020-07-09 VITALS — BP 135/79 | HR 84 | Temp 97.7°F | Ht 65.0 in | Wt 326.0 lb

## 2020-07-09 DIAGNOSIS — F3181 Bipolar II disorder: Secondary | ICD-10-CM

## 2020-07-09 DIAGNOSIS — E119 Type 2 diabetes mellitus without complications: Secondary | ICD-10-CM | POA: Diagnosis not present

## 2020-07-09 DIAGNOSIS — G8929 Other chronic pain: Secondary | ICD-10-CM

## 2020-07-09 DIAGNOSIS — M25561 Pain in right knee: Secondary | ICD-10-CM | POA: Diagnosis not present

## 2020-07-09 DIAGNOSIS — E6609 Other obesity due to excess calories: Secondary | ICD-10-CM

## 2020-07-09 LAB — POCT URINALYSIS DIPSTICK
Bilirubin, UA: NEGATIVE
Blood, UA: NEGATIVE
Glucose, UA: NEGATIVE
Ketones, UA: NEGATIVE
Leukocytes, UA: NEGATIVE
Nitrite, UA: NEGATIVE
Protein, UA: NEGATIVE
Spec Grav, UA: 1.025 (ref 1.010–1.025)
Urobilinogen, UA: 0.2 E.U./dL
pH, UA: 6 (ref 5.0–8.0)

## 2020-07-09 LAB — POCT GLYCOSYLATED HEMOGLOBIN (HGB A1C)
HbA1c POC (<> result, manual entry): 5.6 % (ref 4.0–5.6)
HbA1c, POC (controlled diabetic range): 5.6 % (ref 0.0–7.0)
HbA1c, POC (prediabetic range): 5.6 % — AB (ref 5.7–6.4)
Hemoglobin A1C: 5.6 % (ref 4.0–5.6)

## 2020-07-09 MED ORDER — BUPROPION HCL ER (SR) 150 MG PO TB12: 150.0000 mg | ORAL_TABLET | Freq: Two times a day (BID) | ORAL | 0 refills | Status: DC

## 2020-07-09 NOTE — Patient Instructions (Signed)
Healthy Eating Following a healthy eating pattern may help you to achieve and maintain a healthy body weight, reduce the risk of chronic disease, and live a long and productive life. It is important to follow a healthy eating pattern at an appropriate calorie level for your body. Your nutritional needs should be met primarily through food by choosing a variety of nutrient-rich foods. What are tips for following this plan? Reading food labels  Read labels and choose the following: ? Reduced or low sodium. ? Juices with 100% fruit juice. ? Foods with low saturated fats and high polyunsaturated and monounsaturated fats. ? Foods with whole grains, such as whole wheat, cracked wheat, brown rice, and wild rice. ? Whole grains that are fortified with folic acid. This is recommended for women who are pregnant or who want to become pregnant.  Read labels and avoid the following: ? Foods with a lot of added sugars. These include foods that contain brown sugar, corn sweetener, corn syrup, dextrose, fructose, glucose, high-fructose corn syrup, honey, invert sugar, lactose, malt syrup, maltose, molasses, raw sugar, sucrose, trehalose, or turbinado sugar.  Do not eat more than the following amounts of added sugar per day:  6 teaspoons (25 g) for women.  9 teaspoons (38 g) for men. ? Foods that contain processed or refined starches and grains. ? Refined grain products, such as white flour, degermed cornmeal, white bread, and white rice. Shopping  Choose nutrient-rich snacks, such as vegetables, whole fruits, and nuts. Avoid high-calorie and high-sugar snacks, such as potato chips, fruit snacks, and candy.  Use oil-based dressings and spreads on foods instead of solid fats such as butter, stick margarine, or cream cheese.  Limit pre-made sauces, mixes, and "instant" products such as flavored rice, instant noodles, and ready-made pasta.  Try more plant-protein sources, such as tofu, tempeh, black beans,  edamame, lentils, nuts, and seeds.  Explore eating plans such as the Mediterranean diet or vegetarian diet. Cooking  Use oil to saut or stir-fry foods instead of solid fats such as butter, stick margarine, or lard.  Try baking, boiling, grilling, or broiling instead of frying.  Remove the fatty part of meats before cooking.  Steam vegetables in water or broth. Meal planning  At meals, imagine dividing your plate into fourths: ? One-half of your plate is fruits and vegetables. ? One-fourth of your plate is whole grains. ? One-fourth of your plate is protein, especially lean meats, poultry, eggs, tofu, beans, or nuts.  Include low-fat dairy as part of your daily diet.   Lifestyle  Choose healthy options in all settings, including home, work, school, restaurants, or stores.  Prepare your food safely: ? Wash your hands after handling raw meats. ? Keep food preparation surfaces clean by regularly washing with hot, soapy water. ? Keep raw meats separate from ready-to-eat foods, such as fruits and vegetables. ? Cook seafood, meat, poultry, and eggs to the recommended internal temperature. ? Store foods at safe temperatures. In general:  Keep cold foods at 7F (4.4C) or below.  Keep hot foods at 17F (60C) or above.  Keep your freezer at [facility] (-17.8C) or below.  Foods are no longer safe to eat when they have been between the temperatures of 40-17F (4.4-60C) for more than 2 hours. What foods should I eat? Fruits Aim to eat 2 cup-equivalents of fresh, canned (in natural juice), or frozen fruits each day. Examples of 1 cup-equivalent of fruit include 1 small apple, 8 large strawberries, 1 cup canned fruit,  cup dried fruit, or 1 cup 100% juice. Vegetables Aim to eat 2-3 cup-equivalents of fresh and frozen vegetables each day, including different varieties and colors. Examples of 1 cup-equivalent of vegetables include 2 medium carrots, 2 cups raw, leafy greens, 1 cup chopped  vegetable (raw or cooked), or 1 medium baked potato. Grains Aim to eat 6 ounce-equivalents of whole grains each day. Examples of 1 ounce-equivalent of grains include 1 slice of bread, 1 cup ready-to-eat cereal, 3 cups popcorn, or  cup cooked rice, pasta, or cereal. Meats and other proteins Aim to eat 5-6 ounce-equivalents of protein each day. Examples of 1 ounce-equivalent of protein include 1 egg, 1/2 cup nuts or seeds, or 1 tablespoon (16 g) peanut butter. A cut of meat or fish that is the size of a deck of cards is about 3-4 ounce-equivalents.  Of the protein you eat each week, try to have at least 8 ounces come from seafood. This includes salmon, trout, herring, and anchovies. Dairy Aim to eat 3 cup-equivalents of fat-free or low-fat dairy each day. Examples of 1 cup-equivalent of dairy include 1 cup (240 mL) milk, 8 ounces (250 g) yogurt, 1 ounces (44 g) natural cheese, or 1 cup (240 mL) fortified soy milk. Fats and oils  Aim for about 5 teaspoons (21 g) per day. Choose monounsaturated fats, such as canola and olive oils, avocados, peanut butter, and most nuts, or polyunsaturated fats, such as sunflower, corn, and soybean oils, walnuts, pine nuts, sesame seeds, sunflower seeds, and flaxseed. Beverages  Aim for six 8-oz glasses of water per day. Limit coffee to three to five 8-oz cups per day.  Limit caffeinated beverages that have added calories, such as soda and energy drinks.  Limit alcohol intake to no more than 1 drink a day for nonpregnant women and 2 drinks a day for men. One drink equals 12 oz of beer (355 mL), 5 oz of wine (148 mL), or 1 oz of hard liquor (44 mL). Seasoning and other foods  Avoid adding excess amounts of salt to your foods. Try flavoring foods with herbs and spices instead of salt.  Avoid adding sugar to foods.  Try using oil-based dressings, sauces, and spreads instead of solid fats. This information is based on general [place] nutrition guidelines. For more  information, visit BuildDNA.es. Exact amounts may vary based on your nutrition needs. Summary  A healthy eating plan may help you to maintain a healthy weight, reduce the risk of chronic diseases, and stay active throughout your life.  Plan your meals. Make sure you eat the right portions of a variety of nutrient-rich foods.  Try baking, boiling, grilling, or broiling instead of frying.  Choose healthy options in all settings, including home, work, school, restaurants, or stores. This information is not intended to replace advice given to you by your health care provider. Make sure you discuss any questions you have with your health care provider. Document Revised: 08/07/2017 Document Reviewed: 08/07/2017   Calorie Counting for Weight Loss Calories are units of energy. Your body needs a certain number of calories from food to keep going throughout the day. When you eat or drink more calories than your body needs, your body stores the extra calories mostly as fat. When you eat or drink fewer calories than your body needs, your body burns fat to get the energy it needs. Calorie counting means keeping track of how many calories you eat and drink each day. Calorie counting can be helpful if you need  to lose weight. If you eat fewer calories than your body needs, you should lose weight. Ask your health care provider what a healthy weight is for you. For calorie counting to work, you will need to eat the right number of calories each day to lose a healthy amount of weight per week. A dietitian can help you figure out how many calories you need in a day and will suggest ways to reach your calorie goal.  A healthy amount of weight to lose each week is usually 1-2 lb (0.5-0.9 kg). This usually means that your daily calorie intake should be reduced by 500-750 calories.  Eating 1,200-1,500 calories a day can help most women lose weight.  Eating 1,500-1,800 calories a day can help most men lose  weight. What do I need to know about calorie counting? Work with your health care provider or dietitian to determine how many calories you should get each day. To meet your daily calorie goal, you will need to:  Find out how many calories are in each food that you would like to eat. Try to do this before you eat.  Decide how much of the food you plan to eat.  Keep a food log. Do this by writing down what you ate and how many calories it had. To successfully lose weight, it is important to balance calorie counting with a healthy lifestyle that includes regular activity. Where do I find calorie information? The number of calories in a food can be found on a Nutrition Facts label. If a food does not have a Nutrition Facts label, try to look up the calories online or ask your dietitian for help. Remember that calories are listed per serving. If you choose to have more than one serving of a food, you will have to multiply the calories per serving by the number of servings you plan to eat. For example, the label on a package of bread might say that a serving size is 1 slice and that there are 90 calories in a serving. If you eat 1 slice, you will have eaten 90 calories. If you eat 2 slices, you will have eaten 180 calories.   How do I keep a food log? After each time that you eat, record the following in your food log as soon as possible:  What you ate. Be sure to include toppings, sauces, and other extras on the food.  How much you ate. This can be measured in cups, ounces, or number of items.  How many calories were in each food and drink.  The total number of calories in the food you ate. Keep your food log near you, such as in a pocket-sized notebook or on an app or website on your mobile phone. Some programs will calculate calories for you and show you how many calories you have left to meet your daily goal. What are some portion-control tips?  Know how many calories are in a serving. This  will help you know how many servings you can have of a certain food.  Use a measuring cup to measure serving sizes. You could also try weighing out portions on a kitchen scale. With time, you will be able to estimate serving sizes for some foods.  Take time to put servings of different foods on your favorite plates or in your favorite bowls and cups so you know what a serving looks like.  Try not to eat straight from a food's packaging, such as from a bag  or box. Eating straight from the package makes it hard to see how much you are eating and can lead to overeating. Put the amount you would like to eat in a cup or on a plate to make sure you are eating the right portion.  Use smaller plates, glasses, and bowls for smaller portions and to prevent overeating.  Try not to multitask. For example, avoid watching TV or using your computer while eating. If it is time to eat, sit down at a table and enjoy your food. This will help you recognize when you are full. It will also help you be more mindful of what and how much you are eating. What are tips for following this plan? Reading food labels  Check the calorie count compared with the serving size. The serving size may be smaller than what you are used to eating.  Check the source of the calories. Try to choose foods that are high in protein, fiber, and vitamins, and low in saturated fat, trans fat, and sodium. Shopping  Read nutrition labels while you shop. This will help you make healthy decisions about which foods to buy.  Pay attention to nutrition labels for low-fat or fat-free foods. These foods sometimes have the same number of calories or more calories than the full-fat versions. They also often have added sugar, starch, or salt to make up for flavor that was removed with the fat.  Make a grocery list of lower-calorie foods and stick to it. Cooking  Try to cook your favorite foods in a healthier way. For example, try baking instead of  frying.  Use low-fat dairy products. Meal planning  Use more fruits and vegetables. One-half of your plate should be fruits and vegetables.  Include lean proteins, such as chicken, [place], and fish. Lifestyle Each week, aim to do one of the following:  150 minutes of moderate exercise, such as walking.  75 minutes of vigorous exercise, such as running. General information  Know how many calories are in the foods you eat most often. This will help you calculate calorie counts faster.  Find a way of tracking calories that works for you. Get creative. Try different apps or programs if writing down calories does not work for you. What foods should I eat?  Eat nutritious foods. It is better to have a nutritious, high-calorie food, such as an avocado, than a food with few nutrients, such as a bag of potato chips.  Use your calories on foods and drinks that will fill you up and will not leave you hungry soon after eating. ? Examples of foods that fill you up are nuts and nut butters, vegetables, lean proteins, and high-fiber foods such as whole grains. High-fiber foods are foods with more than 5 g of fiber per serving.  Pay attention to calories in drinks. Low-calorie drinks include water and unsweetened drinks. The items listed above may not be a complete list of foods and beverages you can eat. Contact a dietitian for more information.   What foods should I limit? Limit foods or drinks that are not good sources of vitamins, minerals, or protein or that are high in unhealthy fats. These include:  Candy.  Other sweets.  Sodas, specialty coffee drinks, alcohol, and juice. The items listed above may not be a complete list of foods and beverages you should avoid. Contact a dietitian for more information. How do I count calories when eating out?  Pay attention to portions. Often, portions are much larger  when eating out. Try these tips to keep portions smaller: ? Consider sharing a meal  instead of getting your own. ? If you get your own meal, eat only half of it. Before you start eating, ask for a container and put half of your meal into it. ? When available, consider ordering smaller portions from the menu instead of full portions.  Pay attention to your food and drink choices. Knowing the way food is cooked and what is included with the meal can help you eat fewer calories. ? If calories are listed on the menu, choose the lower-calorie options. ? Choose dishes that include vegetables, fruits, whole grains, low-fat dairy products, and lean proteins. ? Choose items that are boiled, broiled, grilled, or steamed. Avoid items that are buttered, battered, fried, or served with cream sauce. Items labeled as crispy are usually fried, unless stated otherwise. ? Choose water, low-fat milk, unsweetened iced tea, or other drinks without added sugar. If you want an alcoholic beverage, choose a lower-calorie option, such as a glass of wine or light beer. ? Ask for dressings, sauces, and syrups on the side. These are usually high in calories, so you should limit the amount you eat. ? If you want a salad, choose a garden salad and ask for grilled meats. Avoid extra toppings such as bacon, cheese, or fried items. Ask for the dressing on the side, or ask for olive oil and vinegar or lemon to use as dressing.  Estimate how many servings of a food you are given. Knowing serving sizes will help you be aware of how much food you are eating at restaurants. Where to find more information  Centers for Disease Control and Prevention: http://www.wolf.info/  [place] Department of Agriculture: http://www.wilson-mendoza.org/ Summary  Calorie counting means keeping track of how many calories you eat and drink each day. If you eat fewer calories than your body needs, you should lose weight.  A healthy amount of weight to lose per week is usually 1-2 lb (0.5-0.9 kg). This usually means reducing your daily calorie intake by 500-750  calories.  The number of calories in a food can be found on a Nutrition Facts label. If a food does not have a Nutrition Facts label, try to look up the calories online or ask your dietitian for help.  Use smaller plates, glasses, and bowls for smaller portions and to prevent overeating.  Use your calories on foods and drinks that will fill you up and not leave you hungry shortly after a meal. This information is not intended to replace advice given to you by your health care provider. Make sure you discuss any questions you have with your health care provider. Document Revised: 06/06/2019 Document Reviewed: 06/06/2019 Elsevier Patient Education  2021 [employer] Patient Education  2021 [employer].

## 2020-07-09 NOTE — Progress Notes (Signed)
[facility] Patient [facility] [address] [place], [place]  [address] Phone:  [phone]   Fax:  [phone]    Established Patient Office Visit  Subjective:  Patient ID: Christie Walker, female    DOB: October 30, 1970  Age: 50 y.o. MRN: [id number]  CC:  Chief Complaint  Patient presents with  . Follow-up    Knee pain flare up on rt side , going on for a month , popping when walking. 3 month follow up , feels like has fluid on it.     HPI Christie Walker presents for follow up. She  has a past medical history of Acid reflux, Bipolar 1 disorder (HCC), and Essential hypertension (06/14/2016).    Subjective:    Christie Walker is a 50 y.o. female who presents for evaluation of obesity. She has noted a weight gain of approximately 15 pounds over the last 3 months. There is a positive family history for obesity in brother(s). She feels ideal weight is 135 pounds. Weight at graduation from high school was 130 pounds. History of eating disorders: none. Previous treatments for obesity include: none. Associated medical conditions: diabetes mellitus, hyperlipidemia and hypertension. Associated medications: lurasidone. Cardiovascular risk factors besides obesity: diabetes mellitus, dyslipidemia, hypertension, obesity (BMI >= 30 kg/m2) and sedentary lifestyle.  Diabetes Mellitus Patient presents for follow up of diabetes. Current symptoms include: none. She craves sweets. Symptoms have stabilized. Patient denies foot ulcerations, hyperglycemia, hypoglycemia , nausea, paresthesia of the feet, polydipsia, polyuria, visual disturbances, vomiting and weight loss. Evaluation to date has included: fasting blood sugar, fasting lipid panel, hemoglobin A1C and microalbuminuria.  Home sugars: patient does not check sugars. Current treatment: more intensive attention to diet which has been not very effective, increased aerobic exercise which has been ineffective, weight loss of 0 lbs which  has been not ineffective and Continued metformin which has been effective.   Past Medical History:  Diagnosis Date  . Acid reflux   . Bipolar 1 disorder (HCC)   . Essential hypertension 06/14/2016    History reviewed. No pertinent surgical history.  Family History  Problem Relation Age of Onset  . Mental illness Other     Social History   Socioeconomic History  . Marital status: Legally Separated    Spouse name: Not on file  . Number of children: Not on file  . Years of education: Not on file  . Highest education level: Not on file  Occupational History  . Not on file  Tobacco Use  . Smoking status: Never Smoker  . Smokeless tobacco: Never Used  Vaping Use  . Vaping Use: Never used  Substance and Sexual Activity  . Alcohol use: No  . Drug use: No  . Sexual activity: Not Currently    Birth control/protection: None  Other Topics Concern  . Not on file  Social History Narrative  . Not on file   Social Determinants of Health   Financial Resource Strain: Not on file  Food Insecurity: Not on file  Transportation Needs: Not on file  Physical Activity: Not on file  Stress: Not on file  Social Connections: Not on file  Intimate Partner Violence: Not on file    Outpatient Medications Prior to Visit  Medication Sig Dispense Refill  . fluticasone (FLONASE) 50 MCG/ACT nasal spray Place 2 sprays into both nostrils daily. 9.9 g 0  . furosemide (LASIX) 20 MG tablet TAKE 1 TABLET(20 MG) BY MOUTH DAILY 30 tablet 5  . gabapentin (NEURONTIN)  100 MG capsule Take 1 capsule (100 mg total) by mouth 2 (two) times daily. 60 capsule 2  . IRON PO Take 1 tablet by mouth daily.    Marland Kitchen lurasidone 60 MG TABS Take 1 tablet (60 mg total) by mouth daily. 30 tablet 2  . melatonin 5 MG TABS Take 1 tablet (5 mg total) by mouth at bedtime as needed. 60 tablet 2  . metFORMIN (GLUCOPHAGE) 500 MG tablet Take 1 tablet (500 mg total) by mouth daily with breakfast. 90 tablet 3  . acetaminophen (TYLENOL)  325 MG tablet Take 650 mg by mouth every 6 (six) hours as needed for mild pain or headache. (Patient not taking: Reported on 07/09/2020)    . albuterol (PROVENTIL HFA;VENTOLIN HFA) 108 (90 Base) MCG/ACT inhaler Inhale 2 puffs into the lungs every 6 (six) hours as needed for wheezing or shortness of breath. (Patient not taking: No sig reported)    . metoprolol tartrate (LOPRESSOR) 50 MG tablet Take 1 tablet (50 mg total) by mouth 2 (two) times daily. (Patient not taking: Reported on 01/10/2020) 60 tablet 1   No facility-administered medications prior to visit.    Allergies  Allergen Reactions  . Bee Venom Swelling    Swelling at site   . Amoxicillin Hives and Other (See Comments)    Patient states that she can take this she just has a sensitivity when its taken along with pain medication  . Hydrocodone Hives and Other (See Comments)    Sensitivity when taken along with antibiotics per patient  . Insect Extract Allergy Skin Test Other (See Comments)    Spiders - make tongue burn  . Propoxyphene N-Acetaminophen Nausea And Vomiting and Rash    ROS Review of Systems  Musculoskeletal: Positive for joint swelling (knee pain is getting worse. ).      Objective:    Physical Exam Constitutional:      General: She is not in acute distress.    Appearance: She is obese. She is not ill-appearing, toxic-appearing or diaphoretic.  HENT:     Head: Normocephalic and atraumatic.  Cardiovascular:     Rate and Rhythm: Normal rate and regular rhythm.     Pulses: Normal pulses.     Heart sounds: Normal heart sounds.  Pulmonary:     Effort: Pulmonary effort is normal.     Breath sounds: Normal breath sounds.  Musculoskeletal:        General: Normal range of motion.     Cervical back: Normal range of motion and neck supple.     Right lower leg: No edema.     Left lower leg: No edema.  Skin:    General: Skin is warm.     Capillary Refill: Capillary refill takes less than 2 seconds.  Neurological:      General: No focal deficit present.     Mental Status: She is alert and oriented to person, place, and time.  Psychiatric:        Mood and Affect: Mood normal.        Behavior: Behavior normal.        Thought Content: Thought content normal.        Judgment: Judgment normal.     BP 135/79 (BP Location: Left Arm, Patient Position: Sitting, Cuff Size: Large)   Pulse 84   Temp 97.7 F (36.5 C) (Temporal)   Ht 5\' 5"  (1.651 m)   Wt (!) 326 lb (147.9 kg)   SpO2 96%   BMI 54.25  kg/m  Wt Readings from Last 3 Encounters:  07/09/20 (!) 326 lb (147.9 kg)  04/10/20 (!) 322 lb 6.4 oz (146.2 kg)  01/10/20 (!) 311 lb (141.1 kg)     Health Maintenance Due  Topic Date Due  . COVID-19 Vaccine (1) Never done  . OPHTHALMOLOGY EXAM  Never done  . COLONOSCOPY (Pts 45-2150yrs Insurance coverage will need to be confirmed)  Never done  . PAP SMEAR-Modifier  09/16/2019    There are no preventive care reminders to display for this patient.  Lab Results  Component Value Date   TSH 1.080 10/04/2019   Lab Results  Component Value Date   WBC 4.8 01/10/2020   HGB 13.5 01/10/2020   HCT 42.7 01/10/2020   MCV 91 01/10/2020   PLT 218 01/10/2020   Lab Results  Component Value Date   NA 141 04/10/2020   K 4.1 04/10/2020   CO2 26 08/13/2019   GLUCOSE 99 04/10/2020   BUN 10 04/10/2020   CREATININE 0.80 04/10/2020   BILITOT 0.4 04/10/2020   ALKPHOS 96 04/10/2020   AST 15 04/10/2020   ALT 48 (H) 08/05/2019   PROT 6.6 04/10/2020   ALBUMIN 4.2 04/10/2020   CALCIUM 9.3 04/10/2020   ANIONGAP 10 08/13/2019   Lab Results  Component Value Date   CHOL 248 (H) 04/10/2020   Lab Results  Component Value Date   HDL 52 04/10/2020   Lab Results  Component Value Date   LDLCALC 167 (H) 04/10/2020   Lab Results  Component Value Date   TRIG 157 (H) 04/10/2020   Lab Results  Component Value Date   CHOLHDL 4.8 (H) 04/10/2020   Lab Results  Component Value Date   HGBA1C 5.6 07/09/2020    HGBA1C 5.6 07/09/2020   HGBA1C 5.6 (A) 07/09/2020   HGBA1C 5.6 07/09/2020      Assessment & Plan:   Problem List Items Addressed This Visit      Endocrine   Type 2 diabetes mellitus without complication, without long-term current use of insulin (HCC) - Primary Stable with A1C 5.6% Encourage compliance with current treatment regimen  Dose adjustment Will add Crestor with next visit.  Lifestyle modification with healthy diet (fewer calories, more high fiber foods, whole grains and non-starchy vegetables, lower fat meat and fish, low-fat diary include healthy oils) regular exercise (physical activity) and weight loss Opthalmology exam discussed  Nutritional consult recommended Regular dental visits encouraged Home BP monitoring also encouraged goal <130/80    Relevant Orders   POCT Urinalysis Dipstick (Completed)   HgB A1c (Completed)     Other   Bipolar 2 disorder (HCC) Stable on current regimen however [place] may be contributing to weight gain along with dietary choices   Chronic knee pain Persistent weight gain and sedimentary lifestyle probable contributors. Encourage daily walking    Relevant Medications   buPROPion (WELLBUTRIN SR) 150 MG 12 hr tablet   OBESITY Started Bupropion to see if this may help to curb appetite. Obesity with BMI and comorbidities as noted above.  Discussed proper diet (low fat, low sodium, high fiber) with patient.  Discussed avoiding snack machine by bringing food to work with more fruits for the sweet craving encouraged blueberries, kiwi, an apple, grapes Discussed need for regular exercise (3 times per week, 20 minutes per session) with patient.       Meds ordered this encounter  Medications  . buPROPion (WELLBUTRIN SR) 150 MG 12 hr tablet    Sig: Take 1 tablet (150  mg total) by mouth 2 (two) times daily.    Dispense:  60 tablet    Refill:  0    Order Specific Question:   Supervising Provider    Answer:   Quentin Angst [id number]     Follow-up: Return in about 4 weeks (around 08/06/2020) for follow up 4 weeks weight .    Barbette Merino, NP

## 2020-07-10 ENCOUNTER — Telehealth: Payer: Self-pay

## 2020-07-10 ENCOUNTER — Other Ambulatory Visit: Payer: Self-pay | Admitting: Nurse Practitioner

## 2020-07-10 DIAGNOSIS — M545 Low back pain, unspecified: Secondary | ICD-10-CM

## 2020-07-10 MED ORDER — NAPROXEN 500 MG PO TABS: 500.0000 mg | ORAL_TABLET | Freq: Two times a day (BID) | ORAL | 0 refills | Status: DC

## 2020-07-10 NOTE — Telephone Encounter (Signed)
Med refill for Naproxen from yesterday visit.

## 2020-07-10 NOTE — Telephone Encounter (Signed)
Refill sent to [facility].  

## 2020-07-12 ENCOUNTER — Encounter: Payer: Self-pay | Admitting: Nurse Practitioner

## 2020-07-12 NOTE — Progress Notes (Signed)
07/09/20 @ 1030 

## 2020-07-23 ENCOUNTER — Other Ambulatory Visit: Payer: Self-pay | Admitting: Nurse Practitioner

## 2020-07-23 DIAGNOSIS — M545 Low back pain, unspecified: Secondary | ICD-10-CM

## 2020-08-05 ENCOUNTER — Other Ambulatory Visit: Payer: Self-pay | Admitting: Nurse Practitioner

## 2020-08-06 ENCOUNTER — Encounter: Payer: Self-pay | Admitting: Nurse Practitioner

## 2020-08-06 ENCOUNTER — Ambulatory Visit (INDEPENDENT_AMBULATORY_CARE_PROVIDER_SITE_OTHER): Payer: 59 | Admitting: Nurse Practitioner

## 2020-08-06 ENCOUNTER — Other Ambulatory Visit: Payer: Self-pay

## 2020-08-06 VITALS — BP 130/83 | HR 82 | Temp 97.9°F | Ht 65.0 in | Wt 329.0 lb

## 2020-08-06 DIAGNOSIS — I10 Essential (primary) hypertension: Secondary | ICD-10-CM | POA: Diagnosis not present

## 2020-08-06 DIAGNOSIS — J9601 Acute respiratory failure with hypoxia: Secondary | ICD-10-CM | POA: Diagnosis not present

## 2020-08-06 DIAGNOSIS — R7303 Prediabetes: Secondary | ICD-10-CM

## 2020-08-06 DIAGNOSIS — E785 Hyperlipidemia, unspecified: Secondary | ICD-10-CM

## 2020-08-06 DIAGNOSIS — Z6841 Body Mass Index (BMI) 40.0 and over, adult: Secondary | ICD-10-CM

## 2020-08-06 MED ORDER — METFORMIN HCL 500 MG PO TABS: 500.0000 mg | ORAL_TABLET | Freq: Two times a day (BID) | ORAL | 3 refills | Status: DC

## 2020-08-06 NOTE — Progress Notes (Signed)
[facility] Patient Christie Walker [address] [place], [place]  [address] Phone:  [phone]   Fax:  [phone]   Established Patient Office Visit  Subjective:  Patient ID: Christie Walker, female    DOB: May 09, 1971  Age: 50 y.o. MRN: [id number]  CC:  Chief Complaint  Patient presents with  . Weight Check    Cutting down portion. Walking down up and down steps. Drinking water. Soda once a month.    HPI Christie Walker presents for follow up. She  has a past medical history of Acid reflux, Bipolar 1 disorder (HCC), and Essential hypertension (06/14/2016).   She is following up today for weight management.  She was in 4 weeks ago Wellbutrin was increased to help curb appetite.  She admits that she is cutting down on her portions, and drinking more water.  She is limiting her sodas to once per day.  She is using the stairs.  She is discouraged because she has gained 3 pounds.  She does use lurasidone 60 mg daily for her bipolar disorder.  This is effective.  She is aware that this may cause some weight gain.  Past Medical History:  Diagnosis Date  . Acid reflux   . Bipolar 1 disorder (HCC)   . Essential hypertension 06/14/2016    History reviewed. No pertinent surgical history.  Family History  Problem Relation Age of Onset  . Mental illness Other     Social History   Socioeconomic History  . Marital status: Legally Separated    Spouse name: Not on file  . Number of children: Not on file  . Years of education: Not on file  . Highest education level: Not on file  Occupational History  . Not on file  Tobacco Use  . Smoking status: Never Smoker  . Smokeless tobacco: Never Used  Vaping Use  . Vaping Use: Never used  Substance and Sexual Activity  . Alcohol use: No  . Drug use: No  . Sexual activity: Not Currently    Birth control/protection: None  Other Topics Concern  . Not on file  Social History Narrative  . Not on file   Social Determinants of  Health   Financial Resource Strain: Not on file  Food Insecurity: Not on file  Transportation Needs: Not on file  Physical Activity: Not on file  Stress: Not on file  Social Connections: Not on file  Intimate Partner Violence: Not on file    Outpatient Medications Prior to Visit  Medication Sig Dispense Refill  . fluticasone (FLONASE) 50 MCG/ACT nasal spray Place 2 sprays into both nostrils daily. 9.9 g 0  . furosemide (LASIX) 20 MG tablet TAKE 1 TABLET(20 MG) BY MOUTH DAILY 30 tablet 5  . gabapentin (NEURONTIN) 100 MG capsule Take 1 capsule (100 mg total) by mouth 2 (two) times daily. 60 capsule 2  . IRON PO Take 1 tablet by mouth daily.    Marland Kitchen lurasidone 60 MG TABS Take 1 tablet (60 mg total) by mouth daily. 30 tablet 2  . melatonin 5 MG TABS Take 1 tablet (5 mg total) by mouth at bedtime as needed. 60 tablet 2  . naproxen (NAPROSYN) 500 MG tablet TAKE 1 TABLET(500 MG) BY MOUTH TWICE DAILY WITH A MEAL 30 tablet 5  . metFORMIN (GLUCOPHAGE) 500 MG tablet Take 1 tablet (500 mg total) by mouth daily with breakfast. 90 tablet 3  . acetaminophen (TYLENOL) 325 MG tablet Take 650 mg by mouth every  6 (six) hours as needed for mild pain or headache. (Patient not taking: No sig reported)    . buPROPion (WELLBUTRIN SR) 150 MG 12 hr tablet TAKE 1 TABLET(150 MG) BY MOUTH TWICE DAILY (Patient not taking: Reported on 08/06/2020) 60 tablet 0   No facility-administered medications prior to visit.    Allergies  Allergen Reactions  . Bee Venom Swelling    Swelling at site   . Amoxicillin Hives and Other (See Comments)    Patient states that she can take this she just has a sensitivity when its taken along with pain medication  . Hydrocodone Hives and Other (See Comments)    Sensitivity when taken along with antibiotics per patient  . Insect Extract Allergy Skin Test Other (See Comments)    Spiders - make tongue burn  . Propoxyphene N-Acetaminophen Nausea And Vomiting and Rash    ROS Review of  Systems    Objective:    Physical Exam  BP 130/83 (BP Location: Left Arm, Patient Position: Sitting, Cuff Size: Normal)   Pulse 82   Temp 97.9 F (36.6 C) (Temporal)   Ht 5\' 5"  (1.651 m)   Wt (!) 329 lb (149.2 kg)   SpO2 98%   BMI 54.75 kg/m  Wt Readings from Last 3 Encounters:  08/06/20 (!) 329 lb (149.2 kg)  07/09/20 (!) 326 lb (147.9 kg)  04/10/20 (!) 322 lb 6.4 oz (146.2 kg)     Health Maintenance Due  Topic Date Due  . COVID-19 Vaccine (1) Never done  . OPHTHALMOLOGY EXAM  Never done  . COLONOSCOPY (Pts 45-64yrs Insurance coverage will need to be confirmed)  Never done  . PAP SMEAR-Modifier  09/16/2019    There are no preventive care reminders to display for this patient.  Lab Results  Component Value Date   TSH 1.080 10/04/2019   Lab Results  Component Value Date   WBC 4.8 01/10/2020   HGB 13.5 01/10/2020   HCT 42.7 01/10/2020   MCV 91 01/10/2020   PLT 218 01/10/2020   Lab Results  Component Value Date   NA 141 04/10/2020   K 4.1 04/10/2020   CO2 26 08/13/2019   GLUCOSE 99 04/10/2020   BUN 10 04/10/2020   CREATININE 0.80 04/10/2020   BILITOT 0.4 04/10/2020   ALKPHOS 96 04/10/2020   AST 15 04/10/2020   ALT 48 (H) 08/05/2019   PROT 6.6 04/10/2020   ALBUMIN 4.2 04/10/2020   CALCIUM 9.3 04/10/2020   ANIONGAP 10 08/13/2019   Lab Results  Component Value Date   CHOL 248 (H) 04/10/2020   Lab Results  Component Value Date   HDL 52 04/10/2020   Lab Results  Component Value Date   LDLCALC 167 (H) 04/10/2020   Lab Results  Component Value Date   TRIG 157 (H) 04/10/2020   Lab Results  Component Value Date   CHOLHDL 4.8 (H) 04/10/2020   Lab Results  Component Value Date   HGBA1C 5.6 07/09/2020   HGBA1C 5.6 07/09/2020   HGBA1C 5.6 (A) 07/09/2020   HGBA1C 5.6 07/09/2020     Assessment & Plan:   Problem List Items Addressed This Visit      Respiratory   Acute respiratory failure with hypoxia (HCC) Resolved     Other    OBESITY Persistent declines additional treatment with diet suppressant will increase Metformin 1 tablet twice daily with meals to help stimulate possible weight loss   Relevant Medications   metFORMIN (GLUCOPHAGE) 500 MG tablet  Other Visit Diagnoses    Dyslipidemia with high LDL and low HDL    -  Primary   Relevant Orders   Lipid panel (Completed)   Prediabetes  A1c stable at 5.6%   Relevant Medications   metFORMIN (GLUCOPHAGE) 500 MG tablet   Other Relevant Orders   Comp. Metabolic Panel (12) (Completed)   Essential hypertension   Stable Encouraged on going compliance with current medication regimen Encouraged home monitoring and recording BP <130/80 Eating a heart-healthy diet with less salt Encouraged regular physical activity  Recommend Weight loss   Relevant Medications   metFORMIN (GLUCOPHAGE) 500 MG tablet   Other Relevant Orders   Comp. Metabolic Panel (12) (Completed)      Meds ordered this encounter  Medications  . metFORMIN (GLUCOPHAGE) 500 MG tablet    Sig: Take 1 tablet (500 mg total) by mouth 2 (two) times daily with a meal.    Dispense:  180 tablet    Refill:  3    Order Specific Question:   Supervising Provider    Answer:   Quentin Angst [id number]    Follow-up: Return in about 6 weeks (around 09/17/2020) for Follow-up for weight management [id number].    Barbette Merino, NP

## 2020-08-06 NOTE — Patient Instructions (Signed)
Calorie Counting for Weight Loss Calories are units of energy. Your body needs a certain number of calories from food to keep going throughout the day. When you eat or drink more calories than your body needs, your body stores the extra calories mostly as fat. When you eat or drink fewer calories than your body needs, your body burns fat to get the energy it needs. Calorie counting means keeping track of how many calories you eat and drink each day. Calorie counting can be helpful if you need to lose weight. If you eat fewer calories than your body needs, you should lose weight. Ask your health care provider what a healthy weight is for you. For calorie counting to work, you will need to eat the right number of calories each day to lose a healthy amount of weight per week. A dietitian can help you figure out how many calories you need in a day and will suggest ways to reach your calorie goal.  A healthy amount of weight to lose each week is usually 1-2 lb (0.5-0.9 kg). This usually means that your daily calorie intake should be reduced by 500-750 calories.  Eating 1,200-1,500 calories a day can help most women lose weight.  Eating 1,500-1,800 calories a day can help most men lose weight. What do I need to know about calorie counting? Work with your health care provider or dietitian to determine how many calories you should get each day. To meet your daily calorie goal, you will need to:  Find out how many calories are in each food that you would like to eat. Try to do this before you eat.  Decide how much of the food you plan to eat.  Keep a food log. Do this by writing down what you ate and how many calories it had. To successfully lose weight, it is important to balance calorie counting with a healthy lifestyle that includes regular activity. Where do I find calorie information? The number of calories in a food can be found on a Nutrition Facts label. If a food does not have a Nutrition Facts  label, try to look up the calories online or ask your dietitian for help. Remember that calories are listed per serving. If you choose to have more than one serving of a food, you will have to multiply the calories per serving by the number of servings you plan to eat. For example, the label on a package of bread might say that a serving size is 1 slice and that there are 90 calories in a serving. If you eat 1 slice, you will have eaten 90 calories. If you eat 2 slices, you will have eaten 180 calories.   How do I keep a food log? After each time that you eat, record the following in your food log as soon as possible:  What you ate. Be sure to include toppings, sauces, and other extras on the food.  How much you ate. This can be measured in cups, ounces, or number of items.  How many calories were in each food and drink.  The total number of calories in the food you ate. Keep your food log near you, such as in a pocket-sized notebook or on an app or website on your mobile phone. Some programs will calculate calories for you and show you how many calories you have left to meet your daily goal. What are some portion-control tips?  Know how many calories are in a serving. This will   help you know how many servings you can have of a certain food.  Use a measuring cup to measure serving sizes. You could also try weighing out portions on a kitchen scale. With time, you will be able to estimate serving sizes for some foods.  Take time to put servings of different foods on your favorite plates or in your favorite bowls and cups so you know what a serving looks like.  Try not to eat straight from a food's packaging, such as from a bag or box. Eating straight from the package makes it hard to see how much you are eating and can lead to overeating. Put the amount you would like to eat in a cup or on a plate to make sure you are eating the right portion.  Use smaller plates, glasses, and bowls for smaller  portions and to prevent overeating.  Try not to multitask. For example, avoid watching TV or using your computer while eating. If it is time to eat, sit down at a table and enjoy your food. This will help you recognize when you are full. It will also help you be more mindful of what and how much you are eating. What are tips for following this plan? Reading food labels  Check the calorie count compared with the serving size. The serving size may be smaller than what you are used to eating.  Check the source of the calories. Try to choose foods that are high in protein, fiber, and vitamins, and low in saturated fat, trans fat, and sodium. Shopping  Read nutrition labels while you shop. This will help you make healthy decisions about which foods to buy.  Pay attention to nutrition labels for low-fat or fat-free foods. These foods sometimes have the same number of calories or more calories than the full-fat versions. They also often have added sugar, starch, or salt to make up for flavor that was removed with the fat.  Make a grocery list of lower-calorie foods and stick to it. Cooking  Try to cook your favorite foods in a healthier way. For example, try baking instead of frying.  Use low-fat dairy products. Meal planning  Use more fruits and vegetables. One-half of your plate should be fruits and vegetables.  Include lean proteins, such as chicken, turkey, and fish. Lifestyle Each week, aim to do one of the following:  150 minutes of moderate exercise, such as walking.  75 minutes of vigorous exercise, such as running. General information  Know how many calories are in the foods you eat most often. This will help you calculate calorie counts faster.  Find a way of tracking calories that works for you. Get creative. Try different apps or programs if writing down calories does not work for you. What foods should I eat?  Eat nutritious foods. It is better to have a nutritious,  high-calorie food, such as an avocado, than a food with few nutrients, such as a bag of potato chips.  Use your calories on foods and drinks that will fill you up and will not leave you hungry soon after eating. ? Examples of foods that fill you up are nuts and nut butters, vegetables, lean proteins, and high-fiber foods such as whole grains. High-fiber foods are foods with more than 5 g of fiber per serving.  Pay attention to calories in drinks. Low-calorie drinks include water and unsweetened drinks. The items listed above may not be a complete list of foods and beverages you can eat.   Contact a dietitian for more information.   What foods should I limit? Limit foods or drinks that are not good sources of vitamins, minerals, or protein or that are high in unhealthy fats. These include:  Candy.  Other sweets.  Sodas, specialty coffee drinks, alcohol, and juice. The items listed above may not be a complete list of foods and beverages you should avoid. Contact a dietitian for more information. How do I count calories when eating out?  Pay attention to portions. Often, portions are much larger when eating out. Try these tips to keep portions smaller: ? Consider sharing a meal instead of getting your own. ? If you get your own meal, eat only half of it. Before you start eating, ask for a container and put half of your meal into it. ? When available, consider ordering smaller portions from the menu instead of full portions.  Pay attention to your food and drink choices. Knowing the way food is cooked and what is included with the meal can help you eat fewer calories. ? If calories are listed on the menu, choose the lower-calorie options. ? Choose dishes that include vegetables, fruits, whole grains, low-fat dairy products, and lean proteins. ? Choose items that are boiled, broiled, grilled, or steamed. Avoid items that are buttered, battered, fried, or served with cream sauce. Items labeled as  crispy are usually fried, unless stated otherwise. ? Choose water, low-fat milk, unsweetened iced tea, or other drinks without added sugar. If you want an alcoholic beverage, choose a lower-calorie option, such as a glass of wine or light beer. ? Ask for dressings, sauces, and syrups on the side. These are usually high in calories, so you should limit the amount you eat. ? If you want a salad, choose a garden salad and ask for grilled meats. Avoid extra toppings such as bacon, cheese, or fried items. Ask for the dressing on the side, or ask for olive oil and vinegar or lemon to use as dressing.  Estimate how many servings of a food you are given. Knowing serving sizes will help you be aware of how much food you are eating at restaurants. Where to find more information  Centers for Disease Control and Prevention: FootballExhibition.com.br  [place] Department of Agriculture: WrestlingReporter.dk Summary  Calorie counting means keeping track of how many calories you eat and drink each day. If you eat fewer calories than your body needs, you should lose weight.  A healthy amount of weight to lose per week is usually 1-2 lb (0.5-0.9 kg). This usually means reducing your daily calorie intake by 500-750 calories.  The number of calories in a food can be found on a Nutrition Facts label. If a food does not have a Nutrition Facts label, try to look up the calories online or ask your dietitian for help.  Use smaller plates, glasses, and bowls for smaller portions and to prevent overeating.  Use your calories on foods and drinks that will fill you up and not leave you hungry shortly after a meal. This information is not intended to replace advice given to you by your health care provider. Make sure you discuss any questions you have with your health care provider. Document Revised: 06/06/2019 Document Reviewed: 06/06/2019 Elsevier Patient Education  2021 Elsevier Inc.   Exercising to Lose Weight Exercise is structured,  repetitive physical activity to improve fitness and health. Getting regular exercise is important for everyone. It is especially important if you are overweight. Being overweight increases your  risk of heart disease, stroke, diabetes, high blood pressure, and several types of cancer. Reducing your calorie intake and exercising can help you lose weight. Exercise is usually categorized as moderate or vigorous intensity. To lose weight, most people need to do a certain amount of moderate-intensity or vigorous-intensity exercise each week. Moderate-intensity exercise Moderate-intensity exercise is any activity that gets you moving enough to burn at least three times more energy (calories) than if you were sitting. Examples of moderate exercise include:  Walking a mile in 15 minutes.  Doing light yard work.  Biking at an easy pace. Most people should get at least 150 minutes (2 hours and 30 minutes) a week of moderate-intensity exercise to maintain their body weight.   Vigorous-intensity exercise Vigorous-intensity exercise is any activity that gets you moving enough to burn at least six times more calories than if you were sitting. When you exercise at this intensity, you should be working hard enough that you are not able to carry on a conversation. Examples of vigorous exercise include:  Running.  Playing a team sport, such as football, basketball, and soccer.  Jumping rope. Most people should get at least 75 minutes (1 hour and 15 minutes) a week of vigorous-intensity exercise to maintain their body weight. How can exercise affect me? When you exercise enough to burn more calories than you eat, you lose weight. Exercise also reduces body fat and builds muscle. The more muscle you have, the more calories you burn. Exercise also:  Improves mood.  Reduces stress and tension.  Improves your overall fitness, flexibility, and endurance.  Increases bone strength. The amount of exercise you  need to lose weight depends on:  Your age.  The type of exercise.  Any health conditions you have.  Your overall physical ability. Talk to your health care provider about how much exercise you need and what types of activities are safe for you. What actions can I take to lose weight? Nutrition  Make changes to your diet as told by your health care provider or diet and nutrition specialist (dietitian). This may include: ? Eating fewer calories. ? Eating more protein. ? Eating less unhealthy fats. ? Eating a diet that includes fresh fruits and vegetables, whole grains, low-fat dairy products, and lean protein. ? Avoiding foods with added fat, salt, and sugar.  Drink plenty of water while you exercise to prevent dehydration or heat stroke.   Activity  Choose an activity that you enjoy and set realistic goals. Your health care provider can help you make an exercise plan that works for you.  Exercise at a moderate or vigorous intensity most days of the week. ? The intensity of exercise may vary from person to person. You can tell how intense a workout is for you by paying attention to your breathing and heartbeat. Most people will notice their breathing and heartbeat get faster with more intense exercise.  Do resistance training twice each week, such as: ? Push-ups. ? Sit-ups. ? Lifting weights. ? Using resistance bands.  Getting short amounts of exercise can be just as helpful as long structured periods of exercise. If you have trouble finding time to exercise, try to include exercise in your daily routine. ? Get up, stretch, and walk around every 30 minutes throughout the day. ? Go for a walk during your lunch break. ? Park your car farther away from your destination. ? If you take public transportation, get off one stop early and walk the rest of the way. ?  Make phone calls while standing up and walking around. ? Take the stairs instead of elevators or escalators.  Wear  comfortable clothes and shoes with good support.  Do not exercise so much that you hurt yourself, feel dizzy, or get very short of breath. Where to find more information  [place] Department of Health and Human Services: ThisPath.fi  Centers for Disease Control and Prevention (CDC): FootballExhibition.com.br Contact a health care provider:  Before starting a new exercise program.  If you have questions or concerns about your weight.  If you have a medical problem that keeps you from exercising. Get help right away if you have any of the following while exercising:  Injury.  Dizziness.  Difficulty breathing or shortness of breath that does not go away when you stop exercising.  Chest pain.  Rapid heartbeat. Summary  Being overweight increases your risk of heart disease, stroke, diabetes, high blood pressure, and several types of cancer.  Losing weight happens when you burn more calories than you eat.  Reducing the amount of calories you eat in addition to getting regular moderate or vigorous exercise each week helps you lose weight. This information is not intended to replace advice given to you by your health care provider. Make sure you discuss any questions you have with your health care provider. Document Revised: 08/22/2019 Document Reviewed: 08/22/2019 Elsevier Patient Education  2021 [employer].

## 2020-08-07 LAB — COMP. METABOLIC PANEL (12)
AST: 16 IU/L (ref 0–40)
Albumin/Globulin Ratio: 1.4 (ref 1.2–2.2)
Albumin: 4.2 g/dL (ref 3.8–4.8)
Alkaline Phosphatase: 96 IU/L (ref 44–121)
BUN/Creatinine Ratio: 13 (ref 9–23)
BUN: 11 mg/dL (ref 6–24)
Bilirubin Total: <0.2 mg/dL (ref 0.0–1.2)
Calcium: 9.6 mg/dL (ref 8.7–10.2)
Chloride: 101 mmol/L (ref 96–106)
Creatinine, Ser: 0.86 mg/dL (ref 0.57–1.00)
Globulin, Total: 2.9 g/dL (ref 1.5–4.5)
Glucose: 94 mg/dL (ref 65–99)
Potassium: 4.2 mmol/L (ref 3.5–5.2)
Sodium: 141 mmol/L (ref 134–144)
Total Protein: 7.1 g/dL (ref 6.0–8.5)
eGFR: 83 mL/min/{1.73_m2} (ref >59)

## 2020-08-07 LAB — LIPID PANEL
Chol/HDL Ratio: 5.6 ratio — ABNORMAL HIGH (ref 0.0–4.4)
Cholesterol, Total: 242 mg/dL — ABNORMAL HIGH (ref 100–199)
HDL: 43 mg/dL (ref >39)
LDL Chol Calc (NIH): 159 mg/dL — ABNORMAL HIGH (ref 0–99)
Triglycerides: 219 mg/dL — ABNORMAL HIGH (ref 0–149)
VLDL Cholesterol Cal: 40 mg/dL (ref 5–40)

## 2020-08-12 ENCOUNTER — Emergency Department (HOSPITAL_COMMUNITY)
Admission: EM | Admit: 2020-08-12 | Discharge: 2020-08-13 | Disposition: A | Discharge status: Home / Self Care | Payer: 59 | Attending: Emergency Medicine | Admitting: Emergency Medicine

## 2020-08-12 DIAGNOSIS — E119 Type 2 diabetes mellitus without complications: Secondary | ICD-10-CM | POA: Insufficient documentation

## 2020-08-12 DIAGNOSIS — K1121 Acute sialoadenitis: Secondary | ICD-10-CM | POA: Diagnosis not present

## 2020-08-12 DIAGNOSIS — I10 Essential (primary) hypertension: Secondary | ICD-10-CM | POA: Diagnosis not present

## 2020-08-12 DIAGNOSIS — Z8616 Personal history of COVID-19: Secondary | ICD-10-CM | POA: Diagnosis not present

## 2020-08-12 DIAGNOSIS — Z7984 Long term (current) use of oral hypoglycemic drugs: Secondary | ICD-10-CM | POA: Insufficient documentation

## 2020-08-12 DIAGNOSIS — R070 Pain in throat: Secondary | ICD-10-CM | POA: Diagnosis present

## 2020-08-12 DIAGNOSIS — Z20822 Contact with and (suspected) exposure to covid-19: Secondary | ICD-10-CM | POA: Insufficient documentation

## 2020-08-12 MED ORDER — IBUPROFEN 200 MG PO TABS
600.0000 mg | ORAL_TABLET | Freq: Once | ORAL | Status: AC
  Administered 2020-08-12: 600 mg via ORAL
  Filled 2020-08-12: qty 3

## 2020-08-12 NOTE — ED Triage Notes (Signed)
Patient arrived stating last night she has a sore throat. When she woke up states her neck feels swollen. No oral swelling, maintaining secretions, speaking in full sentences. Declines taking any OTC medication prior to arrival.

## 2020-08-12 NOTE — ED Provider Notes (Signed)
MSE was initiated and I personally evaluated the patient and placed orders (if any) at  11:29 PM on August 12, 2020.  Onset chills, ST, throat swelling starting this morning, worsening through the day. H/O pre-diabetes.   Today's Vitals   08/12/20 2031  BP: (!) 150/91  Pulse: (!) 114  Resp: 16  Temp: (!) 100.7 F (38.2 C)  TempSrc: Oral  SpO2: 95%  PainSc: 10-Worst pain ever   There is no height or weight on file to calculate BMI.  Parotid swelling and tenderness bilaterally Oropharynx erythematous Low grade temp  The patient appears stable so that the remainder of the MSE may be completed by another provider.   Elpidio Anis, PA-C 08/12/20 2332    Milagros Loll, MD 08/14/20 [phone]

## 2020-08-13 LAB — RESP PANEL BY RT-PCR (FLU A&B, COVID) ARPGX2
Influenza A by PCR: NEGATIVE
Influenza B by PCR: NEGATIVE
SARS Coronavirus 2 by RT PCR: NEGATIVE

## 2020-08-13 LAB — GROUP A STREP BY PCR: Group A Strep by PCR: NOT DETECTED

## 2020-08-13 MED ORDER — HYDROCODONE-ACETAMINOPHEN 7.5-325 MG/15ML PO SOLN: 10.0000 mL | Freq: Four times a day (QID) | ORAL | 0 refills | Status: DC | PRN

## 2020-08-13 NOTE — Discharge Instructions (Addendum)
Treat any fever with tylenol and/or ibuprofen. You can use Hycet for severe pain. Be sure to drink lots of fluids to avoid dehydration.   Return to the ED with any new or worsening symptoms.

## 2020-08-13 NOTE — ED Provider Notes (Signed)
[facility]-EMERGENCY DEPT Provider Note   CSN: [id number] Arrival date & time: 08/12/20  2024     History Chief Complaint  Patient presents with  . Sore Throat    Christie Walker is a 50 y.o. female.  Patient to ED with c/o sore throat and chills with facial swelling x 1 day. She reports a history of mumps in 2010 and remembers similar symptoms. No vomiting, congestion, cough.  The history is provided by the patient. No language interpreter was used.       Past Medical History:  Diagnosis Date  . Acid reflux   . Bipolar 1 disorder (HCC)   . Essential hypertension 06/14/2016    Patient Active Problem List   Diagnosis Date Noted  . Bipolar 2 disorder (HCC)   . Type 2 diabetes mellitus without complication, without long-term current use of insulin (HCC)   . Obesity, Class III, BMI 40-49.9 (morbid obesity) (HCC)   . Pneumonia due to COVID-19 virus 07/31/2019  . Acute respiratory failure with hypoxia (HCC) 07/31/2019  . SIRS (systemic inflammatory response syndrome) (HCC) 07/31/2019  . Chest tightness 07/31/2019  . Chest pain at rest 08/05/2016  . Morbid obesity with BMI of 50.0-59.9, adult (HCC) 08/05/2016  . Dyspnea on exertion 08/05/2016  . HTN (hypertension) 06/14/2016  . Metabolic syndrome 06/14/2016  . Chronic knee pain 06/14/2016  . OBESITY 03/08/2007  . TMJ SYNDROME 03/08/2007  . GERD 03/08/2007    No past surgical history on file.   OB History   No obstetric history on file.     Family History  Problem Relation Age of Onset  . Mental illness Other     Social History   Tobacco Use  . Smoking status: Never Smoker  . Smokeless tobacco: Never Used  Vaping Use  . Vaping Use: Never used  Substance Use Topics  . Alcohol use: No  . Drug use: No    Home Medications Prior to Admission medications   Medication Sig Start Date End Date Taking? Authorizing Provider  acetaminophen (TYLENOL) 325 MG tablet Take 650 mg by mouth  every 6 (six) hours as needed for mild pain or headache.   Yes [provider]  fluticasone (FLONASE) 50 MCG/ACT nasal spray Place 2 sprays into both nostrils daily. 08/14/19  Yes Rodolph Bong, MD  furosemide (LASIX) 20 MG tablet TAKE 1 TABLET(20 MG) BY MOUTH DAILY 06/17/20  Yes Barbette Merino, NP  gabapentin (NEURONTIN) 100 MG capsule Take 1 capsule (100 mg total) by mouth 2 (two) times daily. 05/22/20  Yes Toy Cookey E, NP  IRON PO Take 1 tablet by mouth daily.   Yes [provider]  lurasidone 60 MG TABS Take 1 tablet (60 mg total) by mouth daily. 05/22/20  Yes Toy Cookey E, NP  melatonin 5 MG TABS Take 1 tablet (5 mg total) by mouth at bedtime as needed. 05/22/20  Yes Toy Cookey E, NP  metFORMIN (GLUCOPHAGE) 500 MG tablet Take 1 tablet (500 mg total) by mouth 2 (two) times daily with a meal. 08/06/20 08/06/21 Yes King, Shana Chute, NP  naproxen (NAPROSYN) 500 MG tablet TAKE 1 TABLET(500 MG) BY MOUTH TWICE DAILY WITH A MEAL 07/24/20  Yes Barbette Merino, NP    Allergies    Bee venom, Amoxicillin, Hydrocodone, Insect extract allergy skin test, and Propoxyphene n-acetaminophen  Review of Systems   Review of Systems  Constitutional: Positive for chills.  HENT: Positive for facial swelling and sore throat.  Respiratory: Negative.  Negative for cough.   Cardiovascular: Negative.   Gastrointestinal: Negative.  Negative for vomiting.  Musculoskeletal: Negative.  Negative for myalgias.  Skin: Negative.  Negative for rash.  Neurological: Negative.     Physical Exam Updated Vital Signs BP (!) 131/98 (BP Location: Left Arm)   Pulse 100   Temp (!) 101.4 F (38.6 C) (Oral)   Resp 16   SpO2 95%   Physical Exam Vitals and nursing note reviewed.  Constitutional:      Appearance: She is well-developed. She is obese. She is not ill-appearing.  HENT:     Head: Normocephalic.     Comments: Tender and significant swelling bilateral parotid glands. Oropharynx  erythematous without significant swelling or exudates.     Nose: No congestion.     Mouth/Throat:     Mouth: Mucous membranes are moist.     Pharynx: Posterior oropharyngeal erythema present. No oropharyngeal exudate.  Cardiovascular:     Rate and Rhythm: Normal rate and regular rhythm.  Pulmonary:     Effort: Pulmonary effort is normal.     Breath sounds: Normal breath sounds. No wheezing, rhonchi or rales.  Musculoskeletal:        General: Normal range of motion.     Cervical back: Normal range of motion and neck supple.  Skin:    General: Skin is warm and dry.     Findings: No rash.  Neurological:     Mental Status: She is alert and oriented to person, place, and time.     ED Results / Procedures / Treatments   Labs (all labs ordered are listed, but only abnormal results are displayed) Labs Reviewed  GROUP A STREP BY PCR  RESP PANEL BY RT-PCR (FLU A&B, COVID) [id number]   Results for orders placed or performed during the hospital encounter of 08/12/20  Group A Strep by PCR   Specimen: Throat; Sterile Swab  Result Value Ref Range   Group A Strep by PCR NOT DETECTED NOT DETECTED   EKG None  Radiology No results found.  Procedures Procedures   Medications Ordered in ED Medications  ibuprofen (ADVIL) tablet 600 mg (600 mg Oral Given 08/12/20 2338)    ED Course  I have reviewed the triage vital signs and the nursing notes.  Pertinent labs & imaging results that were available during my care of the patient were reviewed by me and considered in my medical decision making (see chart for details).    MDM Rules/Calculators/A&P                          Patient to ED with facial swelling, sore throat, chills. Febrile on arrival but nontoxic and in NAD.   There is bilateral parotid swelling and tenderness. Strep negative. Fever responsive to ibuprofen.   Discussed results of tests, diagnosis of parotitis, mumps, importance of hydration. Will provide pain relief. Return  precautions discussed.   Final Clinical Impression(s) / ED Diagnoses Final diagnoses:  Parotitis, acute    Rx / DC Orders ED Discharge Orders    None       Elpidio Anis, PA-C 08/13/20 0235    Milagros Loll, MD 08/14/20 [phone]

## 2020-08-21 ENCOUNTER — Encounter (HOSPITAL_COMMUNITY): Payer: Self-pay | Admitting: Psychiatry

## 2020-08-21 ENCOUNTER — Telehealth (INDEPENDENT_AMBULATORY_CARE_PROVIDER_SITE_OTHER): Payer: 59 | Admitting: Psychiatry

## 2020-08-21 ENCOUNTER — Other Ambulatory Visit: Payer: Self-pay

## 2020-08-21 DIAGNOSIS — F3181 Bipolar II disorder: Secondary | ICD-10-CM | POA: Diagnosis not present

## 2020-08-21 MED ORDER — MELATONIN 5 MG PO TABS: 5.0000 mg | ORAL_TABLET | Freq: Every evening | ORAL | 2 refills | Status: DC | PRN

## 2020-08-21 MED ORDER — GABAPENTIN 100 MG PO CAPS: 100.0000 mg | ORAL_CAPSULE | Freq: Two times a day (BID) | ORAL | 2 refills | Status: DC

## 2020-08-21 MED ORDER — LURASIDONE HCL 60 MG PO TABS: 60.0000 mg | ORAL_TABLET | Freq: Every day | ORAL | 2 refills | Status: DC

## 2020-08-21 NOTE — Progress Notes (Signed)
BH MD/PA/NP OP Progress Note Virtual Visit via Video Note  I connected with Christie Walker on 08/21/20 at 11:30 AM EDT by a video enabled telemedicine application and verified that I am speaking with the correct person using two identifiers.  Location: Patient: Home Provider: Clinic   I discussed the limitations of evaluation and management by telemedicine and the availability of in person appointments. The patient expressed understanding and agreed to proceed.  I provided of non-face-to-face time during this encounter.     08/21/2020 11:37 AM Christie Walker  MRN:  [id number]  Chief Complaint: "Things have been going well.   HPI:  50 year old female seen today for follow up psychiatric evaluation. She has a psychiatric history of bipolar disorder and and schizophrenia.  She is currently managed on gabapentin 100 mg twice daily, Melatonin 5 mg nightly, and Latuda 60 mg daily.  She notes her medications are effective in managing her psychiatric conditions.  Today patient is pleasant, calm, cooperative, maintained eye contact, and engaged in conversation. She informed provider that she is getting over the mumps.  She notes that she still has slight pain in her face but reports that it is a lot better than it was.  She notes that she continues to have minimal anxiety and depression.  Today provider conducted a GAD-7 and patient scored a 2, at her last visit she scored a 2.  Provider also conducted a PHQ-9 and patient scored a 3, at her last visit she scored a 1. She notes that her mood has been stable on her current dose of Latuda.  Today she denies SI/HI/VAH, mania, or paranoia.  She endorses adequate appetite and notes that her sleep fluctuates between 5 to 8 hours.  Patient notes that she may get her hair done later since she is feeling better.  During exam patient was in good spirits and she introduced Clinical research associate to her dog named Christie Walker who she notes that she loves and  spoils.   No medication changes made today.  Patient agreeable to continue medications as scribed.  She will follow-up with outpatient counseling for therapy.  No other concerns at this time. No other concerns noted at this time.  Visit Diagnosis:    ICD-10-CM   1. Bipolar 2 disorder (HCC)  F31.81 gabapentin (NEURONTIN) 100 MG capsule    Lurasidone HCl 60 MG TABS    melatonin 5 MG TABS    Past Psychiatric History:  bipolar disorder and and schizophrenia  Past Medical History:  Past Medical History:  Diagnosis Date  . Acid reflux   . Bipolar 1 disorder (HCC)   . Essential hypertension 06/14/2016   No past surgical history on file.  Family Psychiatric History: Paternal aunts bipolar disorder and father has mental health conditions but unaware which one.   Family History:  Family History  Problem Relation Age of Onset  . Mental illness Other     Social History:  Social History   Socioeconomic History  . Marital status: Legally Separated    Spouse name: Not on file  . Number of children: Not on file  . Years of education: Not on file  . Highest education level: Not on file  Occupational History  . Not on file  Tobacco Use  . Smoking status: Never Smoker  . Smokeless tobacco: Never Used  Vaping Use  . Vaping Use: Never used  Substance and Sexual Activity  . Alcohol use: No  . Drug use: No  . Sexual activity:  Not Currently    Birth control/protection: None  Other Topics Concern  . Not on file  Social History Narrative  . Not on file   Social Determinants of Health   Financial Resource Strain: Not on file  Food Insecurity: Not on file  Transportation Needs: Not on file  Physical Activity: Not on file  Stress: Not on file  Social Connections: Not on file    Allergies:  Allergies  Allergen Reactions  . Bee Venom Swelling    Swelling at site   . Amoxicillin Hives and Other (See Comments)    Patient states that she can take this she just has a sensitivity  when its taken along with pain medication  . Hydrocodone Hives and Other (See Comments)    Sensitivity when taken along with antibiotics per patient  . Insect Extract Allergy Skin Test Other (See Comments)    Spiders - make tongue burn  . Propoxyphene N-Acetaminophen Nausea And Vomiting and Rash    ** Darvacet    Metabolic Disorder Labs: Lab Results  Component Value Date   HGBA1C 5.6 07/09/2020   HGBA1C 5.6 07/09/2020   HGBA1C 5.6 (A) 07/09/2020   HGBA1C 5.6 07/09/2020   MPG 139.85 08/06/2019   MPG 128.37 08/01/2019   No results found for: PROLACTIN Lab Results  Component Value Date   CHOL 242 (H) 08/06/2020   TRIG 219 (H) 08/06/2020   HDL 43 08/06/2020   CHOLHDL 5.6 (H) 08/06/2020   VLDL 32 (H) 06/14/2016   LDLCALC 159 (H) 08/06/2020   LDLCALC 167 (H) 04/10/2020   Lab Results  Component Value Date   TSH 1.080 10/04/2019   TSH 0.262 (L) 08/06/2019    Therapeutic Level Labs: No results found for: LITHIUM No results found for: VALPROATE No components found for:  CBMZ  Current Medications: Current Outpatient Medications  Medication Sig Dispense Refill  . acetaminophen (TYLENOL) 325 MG tablet Take 650 mg by mouth every 6 (six) hours as needed for mild pain or headache.    . fluticasone (FLONASE) 50 MCG/ACT nasal spray Place 2 sprays into both nostrils daily. 9.9 g 0  . furosemide (LASIX) 20 MG tablet TAKE 1 TABLET(20 MG) BY MOUTH DAILY 30 tablet 5  . gabapentin (NEURONTIN) 100 MG capsule Take 1 capsule (100 mg total) by mouth 2 (two) times daily. 60 capsule 2  . HYDROcodone-acetaminophen (HYCET) 7.5-325 mg/15 ml solution Take 10 mLs by mouth every 6 (six) hours as needed for moderate pain or severe pain. 120 mL 0  . IRON PO Take 1 tablet by mouth daily.    . Lurasidone HCl 60 MG TABS Take 1 tablet (60 mg total) by mouth daily. 30 tablet 2  . melatonin 5 MG TABS Take 1 tablet (5 mg total) by mouth at bedtime as needed. 60 tablet 2  . metFORMIN (GLUCOPHAGE) 500 MG tablet  Take 1 tablet (500 mg total) by mouth 2 (two) times daily with a meal. 180 tablet 3  . naproxen (NAPROSYN) 500 MG tablet TAKE 1 TABLET(500 MG) BY MOUTH TWICE DAILY WITH A MEAL 30 tablet 5   No current facility-administered medications for this visit.     Musculoskeletal: Strength & Muscle Tone: Unable to assess due to telehealth visit Gait & Station: Unable to assess due to telehealth visit Patient leans: N/A  Psychiatric Specialty Exam: Review of Systems  There were no vitals taken for this visit.There is no height or weight on file to calculate BMI.  General Appearance: Well Groomed  Eye Contact:  Good  Speech:  Clear and Coherent and Normal Rate  Volume:  Normal  Mood:  Euthymic  Affect:  Appropriate and Congruent  Thought Process:  Coherent, Goal Directed and Linear  Orientation:  Full (Time, Place, and Person)  Thought Content: WDL and Logical   Suicidal Thoughts:  No  Homicidal Thoughts:  No  Memory:  Immediate;   Good Recent;   Good Remote;   Good  Judgement:  Good  Insight:  Good  Psychomotor Activity:  Normal  Concentration:  Concentration: Good and Attention Span: Good  Recall:  Good  Fund of Knowledge: Good  Language: Good  Akathisia:  No  Handed:  Right  AIMS (if indicated): Not done  Assets:  Communication Skills Desire for Improvement Financial Resources/Insurance Housing Social Support  ADL's:  Intact  Cognition: WNL  Sleep:  Good   Screenings: GAD-7   Flowsheet Row Video Visit from 08/21/2020 in [facility] Video Visit from 05/22/2020 in [facility] Office Visit from 04/10/2020 in [facility]  Total GAD-7 Score 2 2 0    PHQ2-9   Flowsheet Row Video Visit from 08/21/2020 in [facility] Video Visit from 05/22/2020 in [facility] Office Visit from 04/10/2020 in [facility] Office Visit from 06/14/2019 in  [facility] Office Visit from 12/14/2018 in [facility]  PHQ-2 Total Score 0 0 0 0 0  PHQ-9 Total Score 3 1 -- -- --    Flowsheet Row Video Visit from 08/21/2020 in [facility] ED from 08/12/2020 in [facility]  C-SSRS RISK CATEGORY No Risk No Risk       Assessment and Plan: Patient notes that over all she is doing well.  No medication changes made today.  Patient agreeable to continue medications as prescribed.  1. Bipolar 2 disorder (HCC)  Continue- gabapentin (NEURONTIN) 100 MG capsule; Take 1 capsule (100 mg total) by mouth 2 (two) times daily.  Dispense: 60 capsule; Refill: 2 Continue- lurasidone 60 MG TABS; Take 1 tablet (60 mg total) by mouth daily.  Dispense: 30 tablet; Refill: 2 Increased- melatonin 5 MG TABS; Take 1 tablet (5 mg total) by mouth at bedtime as needed.  Dispense: 60 tablet; Refill: 2  Follow-up in 3 months  Shanna Cisco, NP  08/21/2020, 11:37 AM

## 2020-09-17 ENCOUNTER — Ambulatory Visit (INDEPENDENT_AMBULATORY_CARE_PROVIDER_SITE_OTHER): Payer: 59 | Admitting: Nurse Practitioner

## 2020-09-17 ENCOUNTER — Other Ambulatory Visit: Payer: Self-pay

## 2020-09-17 ENCOUNTER — Encounter: Payer: Self-pay | Admitting: Nurse Practitioner

## 2020-09-17 VITALS — BP 116/75 | HR 88 | Temp 97.3°F | Ht 65.0 in | Wt 320.0 lb

## 2020-09-17 DIAGNOSIS — Z6841 Body Mass Index (BMI) 40.0 and over, adult: Secondary | ICD-10-CM

## 2020-09-17 DIAGNOSIS — E119 Type 2 diabetes mellitus without complications: Secondary | ICD-10-CM

## 2020-09-17 DIAGNOSIS — E785 Hyperlipidemia, unspecified: Secondary | ICD-10-CM

## 2020-09-17 DIAGNOSIS — I10 Essential (primary) hypertension: Secondary | ICD-10-CM | POA: Diagnosis not present

## 2020-09-17 NOTE — Progress Notes (Signed)
[place], [place]  [address] Phone:  [phone]   Fax:  [phone]   Established Patient Office Visit  Subjective:  Patient ID: Christie Walker, female    DOB: 28-Dec-1970  Age: 50 y.o. MRN: [id number]  CC:  Chief Complaint  Patient presents with  . Follow-up    4 week follow up on weight    HPI Christie Walker presents for weight check. She  has a past medical history of Acid reflux, Bipolar 1 disorder ([place]), and Essential hypertension (06/14/2016).   She is in today for a follow up for weight management. She reports that she was seen in the ED on 08/12/20 for facial swelling, sore throat  "mumps". She was treated in released. She continues to have difficulty with swallowing due to decrease in salvia. She has bee using biotene. She is relying on cereal because it is easy to swallow and moist. She has had a 9 pound weight loss. Denies headache, dizziness, visual changes, shortness of breath, dyspnea on exertion, chest pain, nausea, vomiting or any edema.    Past Medical History:  Diagnosis Date  . Acid reflux   . Bipolar 1 disorder ([place])   . Essential hypertension 06/14/2016    History reviewed. No pertinent surgical history.  Family History  Problem Relation Age of Onset  . Mental illness Other     Social History   Socioeconomic History  . Marital status: Legally Separated    Spouse name: Not on file  . Number of children: Not on file  . Years of education: Not on file  . Highest education level: Not on file  Occupational History  . Not on file  Tobacco Use  . Smoking status: Never Smoker  . Smokeless tobacco: Never Used  Vaping Use  . Vaping Use: Never used  Substance and Sexual Activity  . Alcohol use: No  . Drug use: No  . Sexual activity: Not Currently    Birth control/protection: None  Other Topics Concern  . Not on file  Social History Narrative  . Not on file   Social Determinants of Health    Financial Resource Strain: Not on file  Food Insecurity: Not on file  Transportation Needs: Not on file  Physical Activity: Not on file  Stress: Not on file  Social Connections: Not on file  Intimate Partner Violence: Not on file    Outpatient Medications Prior to Visit  Medication Sig Dispense Refill  . acetaminophen (TYLENOL) 325 MG tablet Take 650 mg by mouth every 6 (six) hours as needed for mild pain or headache.    . fluticasone (FLONASE) 50 MCG/ACT nasal spray Place 2 sprays into both nostrils daily. 9.9 g 0  . furosemide (LASIX) 20 MG tablet TAKE 1 TABLET(20 MG) BY MOUTH DAILY 30 tablet 5  . gabapentin (NEURONTIN) 100 MG capsule Take 1 capsule (100 mg total) by mouth 2 (two) times daily. 60 capsule 2  . HYDROcodone-acetaminophen (HYCET) 7.5-325 mg/15 ml solution Take 10 mLs by mouth every 6 (six) hours as needed for moderate pain or severe pain. 120 mL 0  . IRON PO Take 1 tablet by mouth daily.    . Lurasidone HCl 60 MG TABS Take 1 tablet (60 mg total) by mouth daily. 30 tablet 2  . melatonin 5 MG TABS Take 1 tablet (5 mg total) by mouth at bedtime as needed. 60 tablet 2  . metFORMIN (GLUCOPHAGE) 500 MG tablet Take 1  tablet (500 mg total) by mouth 2 (two) times daily with a meal. 180 tablet 3  . naproxen (NAPROSYN) 500 MG tablet TAKE 1 TABLET(500 MG) BY MOUTH TWICE DAILY WITH A MEAL 30 tablet 5   No facility-administered medications prior to visit.    Allergies  Allergen Reactions  . Bee Venom Swelling    Swelling at site   . Amoxicillin Hives and Other (See Comments)    Patient states that she can take this she just has a sensitivity when its taken along with pain medication  . Hydrocodone Hives and Other (See Comments)    Sensitivity when taken along with antibiotics per patient  . Insect Extract Allergy Skin Test Other (See Comments)    Spiders - make tongue burn  . Propoxyphene N-Acetaminophen Nausea And Vomiting and Rash    ** Darvacet    ROS Review of  Systems    Objective:    Physical Exam Constitutional:      General: She is not in acute distress.    Appearance: She is obese. She is not ill-appearing, toxic-appearing or diaphoretic.  HENT:     Head: Normocephalic and atraumatic.     Nose: Nose normal.     Mouth/Throat:     Mouth: Mucous membranes are moist.  Cardiovascular:     Rate and Rhythm: Normal rate.     Pulses: Normal pulses.     Heart sounds: Normal heart sounds.  Pulmonary:     Effort: Pulmonary effort is normal.     Breath sounds: Normal breath sounds.  Abdominal:     Palpations: Abdomen is soft.  Musculoskeletal:        General: Normal range of motion.     Cervical back: Normal range of motion.     Right lower leg: No edema.     Left lower leg: No edema.  Skin:    General: Skin is warm and dry.     Capillary Refill: Capillary refill takes less than 2 seconds.  Neurological:     General: No focal deficit present.     Mental Status: She is alert and oriented to person, place, and time.  Psychiatric:        Mood and Affect: Mood normal.        Behavior: Behavior normal.        Thought Content: Thought content normal.        Judgment: Judgment normal.     BP 116/75 (BP Location: Left Arm, Patient Position: Sitting, Cuff Size: Large)   Pulse 88   Temp (!) 97.3 F (36.3 C)   Ht $R'5\' 5"'hB$  (1.651 m)   Wt (!) 320 lb 0.2 oz (145.2 kg)   SpO2 99%   BMI 53.25 kg/m  Wt Readings from Last 3 Encounters:  09/17/20 (!) 320 lb 0.2 oz (145.2 kg)  08/06/20 (!) 329 lb (149.2 kg)  07/09/20 (!) 326 lb (147.9 kg)     Health Maintenance Due  Topic Date Due  . COVID-19 Vaccine (1) Never done  . OPHTHALMOLOGY EXAM  Never done  . COLONOSCOPY (Pts 45-1yrs Insurance coverage will need to be confirmed)  Never done  . PAP SMEAR-Modifier  09/16/2019    There are no preventive care reminders to display for this patient.  Lab Results  Component Value Date   TSH 1.080 10/04/2019   Lab Results  Component Value Date    WBC 4.8 01/10/2020   HGB 13.5 01/10/2020   HCT 42.7 01/10/2020   MCV 91  01/10/2020   PLT 218 01/10/2020   Lab Results  Component Value Date   NA 141 08/06/2020   K 4.2 08/06/2020   CO2 26 08/13/2019   GLUCOSE 94 08/06/2020   BUN 11 08/06/2020   CREATININE 0.86 08/06/2020   BILITOT <0.2 08/06/2020   ALKPHOS 96 08/06/2020   AST 16 08/06/2020   ALT 48 (H) 08/05/2019   PROT 7.1 08/06/2020   ALBUMIN 4.2 08/06/2020   CALCIUM 9.6 08/06/2020   ANIONGAP 10 08/13/2019   EGFR 83 08/06/2020   Lab Results  Component Value Date   CHOL 242 (H) 08/06/2020   Lab Results  Component Value Date   HDL 43 08/06/2020   Lab Results  Component Value Date   LDLCALC 159 (H) 08/06/2020   Lab Results  Component Value Date   TRIG 219 (H) 08/06/2020   Lab Results  Component Value Date   CHOLHDL 5.6 (H) 08/06/2020   Lab Results  Component Value Date   HGBA1C 5.6 07/09/2020   HGBA1C 5.6 07/09/2020   HGBA1C 5.6 (A) 07/09/2020   HGBA1C 5.6 07/09/2020      Assessment & Plan:   Problem List Items Addressed This Visit      Endocrine   Type 2 diabetes mellitus without complication, without long-term current use of insulin ([place]) - Primary  Controlled; one year ago A1c was 6.5% Metformin has been effective with maintaining glycemic control most recent A1C 07/09/20 5.6% will continue to monitor      Other   OBESITY Persistent  Weight down 9 pounds however this is due to recent illness; Parotitis     Other Visit Diagnoses    Essential hypertension   Controlled on Furosemide Encouraged on going compliance with current medication regimen Encouraged home monitoring and recording BP <130/80 Eating a heart-healthy diet with less salt Encouraged regular physical activity  Recommend Weight loss    Dyslipidemia with high LDL and low HDL          No orders of the defined types were placed in this encounter.   Follow-up: Return in about 3 months (around 12/18/2020) for follow up medication  management [id number].    Vevelyn Francois, NP

## 2020-09-17 NOTE — Patient Instructions (Signed)
Healthy Eating Following a healthy eating pattern may help you to achieve and maintain a healthy body weight, reduce the risk of chronic disease, and live a long and productive life. It is important to follow a healthy eating pattern at an appropriate calorie level for your body. Your nutritional needs should be met primarily through food by choosing a variety of nutrient-rich foods. What are tips for following this plan? Reading food labels  Read labels and choose the following: ? Reduced or low sodium. ? Juices with 100% fruit juice. ? Foods with low saturated fats and high polyunsaturated and monounsaturated fats. ? Foods with whole grains, such as whole wheat, cracked wheat, brown rice, and wild rice. ? Whole grains that are fortified with folic acid. This is recommended for women who are pregnant or who want to become pregnant.  Read labels and avoid the following: ? Foods with a lot of added sugars. These include foods that contain brown sugar, corn sweetener, corn syrup, dextrose, fructose, glucose, high-fructose corn syrup, honey, invert sugar, lactose, malt syrup, maltose, molasses, raw sugar, sucrose, trehalose, or turbinado sugar.  Do not eat more than the following amounts of added sugar per day:  6 teaspoons (25 g) for women.  9 teaspoons (38 g) for men. ? Foods that contain processed or refined starches and grains. ? Refined grain products, such as white flour, degermed cornmeal, white bread, and white rice. Shopping  Choose nutrient-rich snacks, such as vegetables, whole fruits, and nuts. Avoid high-calorie and high-sugar snacks, such as potato chips, fruit snacks, and candy.  Use oil-based dressings and spreads on foods instead of solid fats such as butter, stick margarine, or cream cheese.  Limit pre-made sauces, mixes, and "instant" products such as flavored rice, instant noodles, and ready-made pasta.  Try more plant-protein sources, such as tofu, tempeh, black beans,  edamame, lentils, nuts, and seeds.  Explore eating plans such as the Mediterranean diet or vegetarian diet. Cooking  Use oil to saut or stir-fry foods instead of solid fats such as butter, stick margarine, or lard.  Try baking, boiling, grilling, or broiling instead of frying.  Remove the fatty part of meats before cooking.  Steam vegetables in water or broth. Meal planning  At meals, imagine dividing your plate into fourths: ? One-half of your plate is fruits and vegetables. ? One-fourth of your plate is whole grains. ? One-fourth of your plate is protein, especially lean meats, poultry, eggs, tofu, beans, or nuts.  Include low-fat dairy as part of your daily diet.   Lifestyle  Choose healthy options in all settings, including home, work, school, restaurants, or stores.  Prepare your food safely: ? Wash your hands after handling raw meats. ? Keep food preparation surfaces clean by regularly washing with hot, soapy water. ? Keep raw meats separate from ready-to-eat foods, such as fruits and vegetables. ? Cook seafood, meat, poultry, and eggs to the recommended internal temperature. ? Store foods at safe temperatures. In general:  Keep cold foods at 7F (4.4C) or below.  Keep hot foods at 17F (60C) or above.  Keep your freezer at [facility] (-17.8C) or below.  Foods are no longer safe to eat when they have been between the temperatures of 40-17F (4.4-60C) for more than 2 hours. What foods should I eat? Fruits Aim to eat 2 cup-equivalents of fresh, canned (in natural juice), or frozen fruits each day. Examples of 1 cup-equivalent of fruit include 1 small apple, 8 large strawberries, 1 cup canned fruit,  cup dried fruit, or 1 cup 100% juice. Vegetables Aim to eat 2-3 cup-equivalents of fresh and frozen vegetables each day, including different varieties and colors. Examples of 1 cup-equivalent of vegetables include 2 medium carrots, 2 cups raw, leafy greens, 1 cup chopped  vegetable (raw or cooked), or 1 medium baked potato. Grains Aim to eat 6 ounce-equivalents of whole grains each day. Examples of 1 ounce-equivalent of grains include 1 slice of bread, 1 cup ready-to-eat cereal, 3 cups popcorn, or  cup cooked rice, pasta, or cereal. Meats and other proteins Aim to eat 5-6 ounce-equivalents of protein each day. Examples of 1 ounce-equivalent of protein include 1 egg, 1/2 cup nuts or seeds, or 1 tablespoon (16 g) peanut butter. A cut of meat or fish that is the size of a deck of cards is about 3-4 ounce-equivalents.  Of the protein you eat each week, try to have at least 8 ounces come from seafood. This includes salmon, trout, herring, and anchovies. Dairy Aim to eat 3 cup-equivalents of fat-free or low-fat dairy each day. Examples of 1 cup-equivalent of dairy include 1 cup (240 mL) milk, 8 ounces (250 g) yogurt, 1 ounces (44 g) natural cheese, or 1 cup (240 mL) fortified soy milk. Fats and oils  Aim for about 5 teaspoons (21 g) per day. Choose monounsaturated fats, such as canola and olive oils, avocados, peanut butter, and most nuts, or polyunsaturated fats, such as sunflower, corn, and soybean oils, walnuts, pine nuts, sesame seeds, sunflower seeds, and flaxseed. Beverages  Aim for six 8-oz glasses of water per day. Limit coffee to three to five 8-oz cups per day.  Limit caffeinated beverages that have added calories, such as soda and energy drinks.  Limit alcohol intake to no more than 1 drink a day for nonpregnant women and 2 drinks a day for men. One drink equals 12 oz of beer (355 mL), 5 oz of wine (148 mL), or 1 oz of hard liquor (44 mL). Seasoning and other foods  Avoid adding excess amounts of salt to your foods. Try flavoring foods with herbs and spices instead of salt.  Avoid adding sugar to foods.  Try using oil-based dressings, sauces, and spreads instead of solid fats. This information is based on general [place] nutrition guidelines. For more  information, visit choosemyplate.gov. Exact amounts may vary based on your nutrition needs. Summary  A healthy eating plan may help you to maintain a healthy weight, reduce the risk of chronic diseases, and stay active throughout your life.  Plan your meals. Make sure you eat the right portions of a variety of nutrient-rich foods.  Try baking, boiling, grilling, or broiling instead of frying.  Choose healthy options in all settings, including home, work, school, restaurants, or stores. This information is not intended to replace advice given to you by your health care provider. Make sure you discuss any questions you have with your health care provider. Document Revised: 08/07/2017 Document Reviewed: 08/07/2017 Elsevier Patient Education  2021 Elsevier Inc.  

## 2020-09-23 ENCOUNTER — Telehealth: Payer: Self-pay

## 2020-09-23 ENCOUNTER — Other Ambulatory Visit: Payer: Self-pay

## 2020-09-23 NOTE — Telephone Encounter (Signed)
Prior authorization submitted, waiting on determination.

## 2020-09-23 NOTE — Telephone Encounter (Signed)
Received an application for Latuda PAP re-enrollment, patient has active [employer] ins and does not qualify for PAP at this time.  PA is required: PA REQUEST # [id number]

## 2020-09-24 NOTE — Telephone Encounter (Signed)
Thank you :)

## 2020-09-28 ENCOUNTER — Telehealth (HOSPITAL_COMMUNITY): Payer: Self-pay | Admitting: *Deleted

## 2020-09-28 NOTE — Telephone Encounter (Signed)
[employer]- MEDIMPACT  AUTHORIZATION APPROVAL:  Lurasidone (LUTADA)HCl 60 MG TABS  PA REFERENCE #  [id number]  EFFECTIVE: 09/26/2020    THRU    09/25/2021

## 2020-09-28 NOTE — Telephone Encounter (Signed)
Patient called to have preferred Rx changed to [facility] [employer]

## 2020-09-29 ENCOUNTER — Telehealth (HOSPITAL_COMMUNITY): Payer: Self-pay | Admitting: *Deleted

## 2020-09-29 ENCOUNTER — Other Ambulatory Visit (HOSPITAL_COMMUNITY): Payer: Self-pay | Admitting: *Deleted

## 2020-09-29 DIAGNOSIS — F3181 Bipolar II disorder: Secondary | ICD-10-CM

## 2020-09-29 MED ORDER — GABAPENTIN 100 MG PO CAPS: 100.0000 mg | ORAL_CAPSULE | Freq: Two times a day (BID) | ORAL | 2 refills | Status: DC

## 2020-09-29 MED ORDER — LURASIDONE HCL 60 MG PO TABS: 60.0000 mg | ORAL_TABLET | Freq: Every day | ORAL | 2 refills | Status: DC

## 2020-09-29 MED ORDER — MELATONIN 5 MG PO TABS: 5.0000 mg | ORAL_TABLET | Freq: Every evening | ORAL | 2 refills | Status: DC | PRN

## 2020-09-29 NOTE — Telephone Encounter (Signed)
Call from patient that she checked with her pharmacy and we didn't move her meds as she requested yesterday. Writer moved her meds to the requested pharmacy.

## 2020-10-06 ENCOUNTER — Other Ambulatory Visit: Payer: Self-pay

## 2020-10-15 ENCOUNTER — Other Ambulatory Visit: Payer: Self-pay

## 2020-11-20 ENCOUNTER — Encounter (HOSPITAL_COMMUNITY): Payer: Self-pay | Admitting: Psychiatry

## 2020-11-20 ENCOUNTER — Other Ambulatory Visit: Payer: Self-pay

## 2020-11-20 ENCOUNTER — Telehealth (INDEPENDENT_AMBULATORY_CARE_PROVIDER_SITE_OTHER): Payer: No Payment, Other | Admitting: Psychiatry

## 2020-11-20 DIAGNOSIS — F3181 Bipolar II disorder: Secondary | ICD-10-CM | POA: Diagnosis not present

## 2020-11-20 MED ORDER — LURASIDONE HCL 60 MG PO TABS: 60.0000 mg | ORAL_TABLET | Freq: Every day | ORAL | 2 refills | Status: DC

## 2020-11-20 MED ORDER — GABAPENTIN 100 MG PO CAPS: 100.0000 mg | ORAL_CAPSULE | Freq: Two times a day (BID) | ORAL | 2 refills | Status: DC

## 2020-11-20 MED ORDER — MELATONIN 5 MG PO TABS: 5.0000 mg | ORAL_TABLET | Freq: Every evening | ORAL | 2 refills | Status: DC | PRN

## 2020-11-20 NOTE — Progress Notes (Signed)
BH MD/PA/NP OP Progress Note Virtual Visit via Video Note  I connected with Christie Walker on 11/20/20 at 11:00 AM EDT by a video enabled telemedicine application and verified that I am speaking with the correct person using two identifiers.  Location: Patient: Home Provider: Clinic   I discussed the limitations of evaluation and management by telemedicine and the availability of in person appointments. The patient expressed understanding and agreed to proceed.  I provided of non-face-to-face time during this encounter.     11/20/2020 11:15 AM Christie Walker  MRN:  [id number]  Chief Complaint: "Things are good".   HPI:  50 year old female seen today for follow up psychiatric evaluation. She has a psychiatric history of bipolar disorder and and schizophrenia.  She is currently managed on gabapentin 100 mg twice daily, Melatonin 5 mg nightly, and Latuda 60 mg daily.  She notes her medications are effective in managing her psychiatric conditions.   Today patient is pleasant, calm, cooperative, maintained eye contact, and engaged in conversation. She informed provider that overall things have been good. She notes that at times she becomes irritated with people at work but reports overall she is happy and things are going well. She informed Clinical research associate that she continues to have minimal anxiety and depression.  Today provider conducted a GAD-7 and patient scored a 5, at her last visit she scored a 2.  Provider also conducted a PHQ-9 and patient scored a 0, at her last visit she scored a 3. She notes that her mood has been stable on her current dose of Latuda.  Today she denies SI/HI/VAH, mania, or paranoia.  She endorses adequate appetite and sleep.  Patient notes that she does not have many plans for today besides eating and spending time with [name] her dog.   No medication changes made today.  Patient agreeable to continue medications as scribed.  She will follow-up with  outpatient counseling for therapy.  No other concerns at this time. No other concerns noted at this time.  Visit Diagnosis:    ICD-10-CM   1. Bipolar 2 disorder (HCC)  F31.81 gabapentin (NEURONTIN) 100 MG capsule    Lurasidone HCl 60 MG TABS    melatonin 5 MG TABS      Past Psychiatric History:  bipolar disorder and and schizophrenia  Past Medical History:  Past Medical History:  Diagnosis Date   Acid reflux    Bipolar 1 disorder (HCC)    Essential hypertension 06/14/2016   History reviewed. No pertinent surgical history.  Family Psychiatric History: Paternal aunts bipolar disorder and father has mental health conditions but unaware which one.   Family History:  Family History  Problem Relation Age of Onset   Mental illness Other     Social History:  Social History   Socioeconomic History   Marital status: Legally Separated    Spouse name: Not on file   Number of children: Not on file   Years of education: Not on file   Highest education level: Not on file  Occupational History   Not on file  Tobacco Use   Smoking status: Never   Smokeless tobacco: Never  Vaping Use   Vaping Use: Never used  Substance and Sexual Activity   Alcohol use: No   Drug use: No   Sexual activity: Not Currently    Birth control/protection: None  Other Topics Concern   Not on file  Social History Narrative   Not on file   Social Determinants of  Health   Financial Resource Strain: Not on file  Food Insecurity: Not on file  Transportation Needs: Not on file  Physical Activity: Not on file  Stress: Not on file  Social Connections: Not on file    Allergies:  Allergies  Allergen Reactions   Bee Venom Swelling    Swelling at site    Amoxicillin Hives and Other (See Comments)    Patient states that she can take this she just has a sensitivity when its taken along with pain medication   Hydrocodone Hives and Other (See Comments)    Sensitivity when taken along with antibiotics per  patient   Insect Extract Allergy Skin Test Other (See Comments)    Spiders - make tongue burn   Propoxyphene N-Acetaminophen Nausea And Vomiting and Rash    ** Darvacet    Metabolic Disorder Labs: Lab Results  Component Value Date   HGBA1C 5.6 07/09/2020   HGBA1C 5.6 07/09/2020   HGBA1C 5.6 (A) 07/09/2020   HGBA1C 5.6 07/09/2020   MPG 139.85 08/06/2019   MPG 128.37 08/01/2019   No results found for: PROLACTIN Lab Results  Component Value Date   CHOL 242 (H) 08/06/2020   TRIG 219 (H) 08/06/2020   HDL 43 08/06/2020   CHOLHDL 5.6 (H) 08/06/2020   VLDL 32 (H) 06/14/2016   LDLCALC 159 (H) 08/06/2020   LDLCALC 167 (H) 04/10/2020   Lab Results  Component Value Date   TSH 1.080 10/04/2019   TSH 0.262 (L) 08/06/2019    Therapeutic Level Labs: No results found for: LITHIUM No results found for: VALPROATE No components found for:  CBMZ  Current Medications: Current Outpatient Medications  Medication Sig Dispense Refill   acetaminophen (TYLENOL) 325 MG tablet Take 650 mg by mouth every 6 (six) hours as needed for mild pain or headache.     fluticasone (FLONASE) 50 MCG/ACT nasal spray Place 2 sprays into both nostrils daily. 9.9 g 0   furosemide (LASIX) 20 MG tablet TAKE 1 TABLET(20 MG) BY MOUTH DAILY 30 tablet 5   gabapentin (NEURONTIN) 100 MG capsule Take 1 capsule (100 mg total) by mouth 2 (two) times daily. 60 capsule 2   HYDROcodone-acetaminophen (HYCET) 7.5-325 mg/15 ml solution Take 10 mLs by mouth every 6 (six) hours as needed for moderate pain or severe pain. 120 mL 0   IRON PO Take 1 tablet by mouth daily.     Lurasidone HCl 60 MG TABS Take 1 tablet (60 mg total) by mouth daily. 30 tablet 2   melatonin 5 MG TABS Take 1 tablet (5 mg total) by mouth at bedtime as needed. 60 tablet 2   metFORMIN (GLUCOPHAGE) 500 MG tablet Take 1 tablet (500 mg total) by mouth 2 (two) times daily with a meal. 180 tablet 3   naproxen (NAPROSYN) 500 MG tablet TAKE 1 TABLET(500 MG) BY MOUTH  TWICE DAILY WITH A MEAL 30 tablet 5   No current facility-administered medications for this visit.     Musculoskeletal: Strength & Muscle Tone:  Unable to assess due to telehealth visit Gait & Station:  Unable to assess due to telehealth visit Patient leans: N/A  Psychiatric Specialty Exam: Review of Systems  There were no vitals taken for this visit.There is no height or weight on file to calculate BMI.  General Appearance: Well Groomed  Eye Contact:  Good  Speech:  Clear and Coherent and Normal Rate  Volume:  Normal  Mood:  Euthymic  Affect:  Appropriate and Congruent  Thought  Process:  Coherent, Goal Directed and Linear  Orientation:  Full (Time, Place, and Person)  Thought Content: WDL and Logical   Suicidal Thoughts:  No  Homicidal Thoughts:  No  Memory:  Immediate;   Good Recent;   Good Remote;   Good  Judgement:  Good  Insight:  Good  Psychomotor Activity:  Normal  Concentration:  Concentration: Good and Attention Span: Good  Recall:  Good  Fund of Knowledge: Good  Language: Good  Akathisia:  No  Handed:  Right  AIMS (if indicated): Not done  Assets:  Communication Skills Desire for Improvement Financial Resources/Insurance Housing Social Support  ADL's:  Intact  Cognition: WNL  Sleep:  Good   Screenings: GAD-7    Flowsheet Row Video Visit from 11/20/2020 in [facility] Video Visit from 08/21/2020 in [facility] Video Visit from 05/22/2020 in [facility] Office Visit from 04/10/2020 in [facility]  Total GAD-7 Score 5 2 2  0      PHQ2-9    Flowsheet Row Video Visit from 11/20/2020 in [facility] Video Visit from 08/21/2020 in [facility] Video Visit from 05/22/2020 in [facility] Office Visit from 04/10/2020 in [facility] Office Visit from 06/14/2019 in  [facility]  PHQ-2 Total Score 0 0 0 0 0  PHQ-9 Total Score 0 3 1 -- --      Flowsheet Row Video Visit from 08/21/2020 in [facility] ED from 08/12/2020 in [facility]  C-SSRS RISK CATEGORY No Risk No Risk        Assessment and Plan: Patient notes that over all she is doing well.  No medication changes made today.  Patient agreeable to continue medications as prescribed.  1. Bipolar 2 disorder (HCC)  Continue- gabapentin (NEURONTIN) 100 MG capsule; Take 1 capsule (100 mg total) by mouth 2 (two) times daily.  Dispense: 60 capsule; Refill: 2 Continue- lurasidone 60 MG TABS; Take 1 tablet (60 mg total) by mouth daily.  Dispense: 30 tablet; Refill: 2 Increased- melatonin 5 MG TABS; Take 1 tablet (5 mg total) by mouth at bedtime as needed.  Dispense: 60 tablet; Refill: 2  Follow-up in 3 months  10/12/2020, NP  11/20/2020, 11:15 AM

## 2020-12-04 ENCOUNTER — Other Ambulatory Visit: Payer: Self-pay

## 2020-12-04 DIAGNOSIS — R600 Localized edema: Secondary | ICD-10-CM

## 2020-12-04 DIAGNOSIS — I10 Essential (primary) hypertension: Secondary | ICD-10-CM

## 2020-12-04 MED ORDER — FUROSEMIDE 20 MG PO TABS: ORAL_TABLET | ORAL | 5 refills | Status: DC

## 2020-12-24 ENCOUNTER — Other Ambulatory Visit: Payer: Self-pay

## 2020-12-24 ENCOUNTER — Encounter: Payer: Self-pay | Admitting: Nurse Practitioner

## 2020-12-24 ENCOUNTER — Ambulatory Visit (INDEPENDENT_AMBULATORY_CARE_PROVIDER_SITE_OTHER): Payer: Self-pay | Admitting: Nurse Practitioner

## 2020-12-24 VITALS — BP 132/86 | HR 82 | Temp 97.5°F | Ht 65.0 in | Wt 322.1 lb

## 2020-12-24 DIAGNOSIS — E119 Type 2 diabetes mellitus without complications: Secondary | ICD-10-CM

## 2020-12-24 DIAGNOSIS — E785 Hyperlipidemia, unspecified: Secondary | ICD-10-CM

## 2020-12-24 DIAGNOSIS — B379 Candidiasis, unspecified: Secondary | ICD-10-CM

## 2020-12-24 DIAGNOSIS — Z1211 Encounter for screening for malignant neoplasm of colon: Secondary | ICD-10-CM

## 2020-12-24 DIAGNOSIS — B372 Candidiasis of skin and nail: Secondary | ICD-10-CM

## 2020-12-24 DIAGNOSIS — Z6841 Body Mass Index (BMI) 40.0 and over, adult: Secondary | ICD-10-CM

## 2020-12-24 DIAGNOSIS — I10 Essential (primary) hypertension: Secondary | ICD-10-CM

## 2020-12-24 LAB — POCT GLYCOSYLATED HEMOGLOBIN (HGB A1C): Hemoglobin A1C: 5.8 % — AB (ref 4.0–5.6)

## 2020-12-24 LAB — POCT URINALYSIS DIPSTICK
Bilirubin, UA: NEGATIVE
Blood, UA: NEGATIVE
Glucose, UA: NEGATIVE
Ketones, UA: NEGATIVE
Leukocytes, UA: NEGATIVE
Nitrite, UA: NEGATIVE
Protein, UA: NEGATIVE
Spec Grav, UA: 1.025 (ref 1.010–1.025)
Urobilinogen, UA: 0.2 E.U./dL
pH, UA: 6.5 (ref 5.0–8.0)

## 2020-12-24 MED ORDER — FLUCONAZOLE 150 MG PO TABS: 150.0000 mg | ORAL_TABLET | Freq: Once | ORAL | 0 refills | Status: AC

## 2020-12-24 MED ORDER — NYSTATIN 100000 UNIT/GM EX POWD: 1.0000 "application " | Freq: Three times a day (TID) | CUTANEOUS | 0 refills | Status: DC

## 2020-12-24 MED ORDER — NYSTATIN-TRIAMCINOLONE 100000-0.1 UNIT/GM-% EX OINT: 1.0000 "application " | TOPICAL_OINTMENT | Freq: Two times a day (BID) | CUTANEOUS | 0 refills | Status: DC

## 2020-12-24 NOTE — Progress Notes (Signed)
[address], [place]  [address] Phone:  [phone]   Fax:  [phone]   Established Patient Office Visit  Subjective:  Patient ID: Christie Walker, female    DOB: 1970-09-13  Age: 50 y.o. MRN: [id number]  CC:  Chief Complaint  Patient presents with   Follow-up    3 month follow up, rash under both breast     HPI Christie Walker presents for follow up. She . has a past medical history of Acid reflux, Bipolar 1 disorder ([place]), and Essential hypertension (06/14/2016).   Is in today for follow-up for hypertension.  She is currently on furosemide 20 mg.  This is effective in helping with edema.  Her blood pressure is stable 130s over 80s. She has a history of diabetes is currently on metformin twice daily.  She does not monitor her CBG at home due to A1c at highest level 6.5%.  Currently at 5.8%.  She had lost about 9 pounds but has gained 2 pounds.  She does continue to work at night and is making some lifestyle modification. She is complaining of rash under her breast and in her abdominal folds.  She has had a history of yeast infections in the past.  She reports this was treated with oral agent and topical cream.  She is concerned with why this has recurred.  She reports that he is very itchy.  This has been going on for greater than 1 week and has progressed.  Past Medical History:  Diagnosis Date   Acid reflux    Bipolar 1 disorder ([place])    Essential hypertension 06/14/2016    No past surgical history on file.  Family History  Problem Relation Age of Onset   Mental illness Other     Social History   Socioeconomic History   Marital status: Legally Separated    Spouse name: Not on file   Number of children: Not on file   Years of education: Not on file   Highest education level: Not on file  Occupational History   Not on file  Tobacco Use   Smoking status: Never   Smokeless tobacco: Never  Vaping Use   Vaping  Use: Never used  Substance and Sexual Activity   Alcohol use: No   Drug use: No   Sexual activity: Not Currently    Birth control/protection: None  Other Topics Concern   Not on file  Social History Narrative   Not on file   Social Determinants of Health   Financial Resource Strain: Not on file  Food Insecurity: Not on file  Transportation Needs: Not on file  Physical Activity: Not on file  Stress: Not on file  Social Connections: Not on file  Intimate Partner Violence: Not on file    Outpatient Medications Prior to Visit  Medication Sig Dispense Refill   acetaminophen (TYLENOL) 325 MG tablet Take 650 mg by mouth every 6 (six) hours as needed for mild pain or headache.     fluticasone (FLONASE) 50 MCG/ACT nasal spray Place 2 sprays into both nostrils daily. 9.9 g 0   furosemide (LASIX) 20 MG tablet TAKE 1 TABLET(20 MG) BY MOUTH DAILY 30 tablet 5   gabapentin (NEURONTIN) 100 MG capsule Take 1 capsule (100 mg total) by mouth 2 (two) times daily. 60 capsule 2   IRON PO Take 1 tablet by mouth daily.     Lurasidone HCl 60 MG TABS Take 1 tablet (  60 mg total) by mouth daily. 30 tablet 2   melatonin 5 MG TABS Take 1 tablet (5 mg total) by mouth at bedtime as needed. 60 tablet 2   metFORMIN (GLUCOPHAGE) 500 MG tablet Take 1 tablet (500 mg total) by mouth 2 (two) times daily with a meal. 180 tablet 3   naproxen (NAPROSYN) 500 MG tablet TAKE 1 TABLET(500 MG) BY MOUTH TWICE DAILY WITH A MEAL 30 tablet 5   HYDROcodone-acetaminophen (HYCET) 7.5-325 mg/15 ml solution Take 10 mLs by mouth every 6 (six) hours as needed for moderate pain or severe pain. (Patient not taking: Reported on 12/24/2020) 120 mL 0   No facility-administered medications prior to visit.    Allergies  Allergen Reactions   Bee Venom Swelling    Swelling at site    Amoxicillin Hives and Other (See Comments)    Patient states that she can take this she just has a sensitivity when its taken along with pain medication    Hydrocodone Hives and Other (See Comments)    Sensitivity when taken along with antibiotics per patient   Insect Extract Allergy Skin Test Other (See Comments)    Spiders - make tongue burn   Propoxyphene N-Acetaminophen Nausea And Vomiting and Rash    ** Darvacet    ROS Review of Systems    Objective:    Physical Exam Constitutional:      Appearance: She is obese.  HENT:     Head: Normocephalic and atraumatic.     Nose: Nose normal.     Mouth/Throat:     Mouth: Mucous membranes are moist.  Cardiovascular:     Rate and Rhythm: Normal rate and regular rhythm.     Pulses: Normal pulses.     Heart sounds: Normal heart sounds.  Pulmonary:     Effort: Pulmonary effort is normal.     Breath sounds: Normal breath sounds.  Abdominal:     Palpations: Abdomen is soft.  Musculoskeletal:        General: Normal range of motion.     Cervical back: Normal range of motion.  Skin:    General: Skin is warm and dry.     Capillary Refill: Capillary refill takes less than 2 seconds.  Neurological:     General: No focal deficit present.     Mental Status: She is alert and oriented to person, place, and time.  Psychiatric:        Mood and Affect: Mood normal.        Behavior: Behavior normal.        Thought Content: Thought content normal.        Judgment: Judgment normal.    BP 132/86   Pulse 82   Temp (!) 97.5 F (36.4 C)   Ht $R'5\' 5"'gf$  (1.651 m)   Wt (!) 322 lb 0.8 oz (146.1 kg)   SpO2 98%   BMI 53.59 kg/m  Wt Readings from Last 3 Encounters:  12/24/20 (!) 322 lb 0.8 oz (146.1 kg)  09/17/20 (!) 320 lb 0.2 oz (145.2 kg)  08/06/20 (!) 329 lb (149.2 kg)     Health Maintenance Due  Topic Date Due   PAP SMEAR-Modifier  09/16/2019   URINE MICROALBUMIN  01/09/2021    There are no preventive care reminders to display for this patient.  Lab Results  Component Value Date   TSH 1.080 10/04/2019   Lab Results  Component Value Date   WBC 4.8 01/10/2020   HGB 13.5 01/10/2020  HCT 42.7 01/10/2020   MCV 91 01/10/2020   PLT 218 01/10/2020   Lab Results  Component Value Date   NA 141 08/06/2020   K 4.2 08/06/2020   CO2 26 08/13/2019   GLUCOSE 94 08/06/2020   BUN 11 08/06/2020   CREATININE 0.86 08/06/2020   BILITOT <0.2 08/06/2020   ALKPHOS 96 08/06/2020   AST 16 08/06/2020   ALT 48 (H) 08/05/2019   PROT 7.1 08/06/2020   ALBUMIN 4.2 08/06/2020   CALCIUM 9.6 08/06/2020   ANIONGAP 10 08/13/2019   EGFR 83 08/06/2020   Lab Results  Component Value Date   CHOL 242 (H) 08/06/2020   Lab Results  Component Value Date   HDL 43 08/06/2020   Lab Results  Component Value Date   LDLCALC 159 (H) 08/06/2020   Lab Results  Component Value Date   TRIG 219 (H) 08/06/2020   Lab Results  Component Value Date   CHOLHDL 5.6 (H) 08/06/2020   Lab Results  Component Value Date   HGBA1C 5.8 (A) 12/24/2020      Assessment & Plan:   Problem List Items Addressed This Visit       Endocrine   Type 2 diabetes mellitus without complication, without long-term current use of insulin (HCC) - Primary Controlled Encourage compliance with current treatment regimen   Encourage regular CBG monitoring Encourage contacting office if excessive hyperglycemia and or hypoglycemia Lifestyle modification with healthy diet (fewer calories, more high fiber foods, whole grains and non-starchy vegetables, lower fat meat and fish, low-fat diary include healthy oils) regular exercise (physical activity) and weight loss Opthalmology exam discussed  Nutritional consult recommended Home BP monitoring also encouraged goal <130/80    Relevant Orders   Urinalysis Dipstick   HgB A1c (Completed)   Lipid panel     Other   OBESITY Obesity with BMI and comorbidities as noted above.  Discussed proper diet (low fat, low sodium, high fiber) with patient.   Discussed need for regular exercise (3 times per week, 20 minutes per session) with patient.    Other Visit Diagnoses      Essential hypertension     Encouraged on going compliance with current medication regimen Encouraged home monitoring and recording BP <130/80 Eating a heart-healthy diet with less salt Encouraged regular physical activity  Recommend Weight loss     Relevant Orders   Urinalysis Dipstick   Comp. Metabolic Panel (12)   Dyslipidemia with high LDL and low HDL     Persistent Encouraged heart healthy diet and regular exercise Labs pending   Yeast infection of the skin     Treatment as below discussed causes of yeast   Relevant Medications   nystatin (MYCOSTATIN/NYSTOP) powder   nystatin-triamcinolone ointment (MYCOLOG)   fluconazole (DIFLUCAN) 150 MG tablet   Candidiasis       Relevant Medications   nystatin (MYCOSTATIN/NYSTOP) powder   nystatin-triamcinolone ointment (MYCOLOG)   fluconazole (DIFLUCAN) 150 MG tablet   Screening for colon cancer       Relevant Orders   Cologuard       Meds ordered this encounter  Medications   nystatin (MYCOSTATIN/NYSTOP) powder    Sig: Apply 1 application topically 3 (three) times daily.    Dispense:  15 g    Refill:  0    Order Specific Question:   Supervising Provider    Answer:   Tresa Garter [[id number]]   nystatin-triamcinolone ointment (MYCOLOG)    Sig: Apply 1 application topically 2 (two) times  daily.    Dispense:  30 g    Refill:  0    Order Specific Question:   Supervising Provider    Answer:   Tresa Garter [[id number]]   fluconazole (DIFLUCAN) 150 MG tablet    Sig: Take 1 tablet (150 mg total) by mouth once for 1 dose.    Dispense:  1 tablet    Refill:  0    Order Specific Question:   Supervising Provider    Answer:   Tresa Garter [[id number]]    Follow-up: Return in about 3 months (around 03/26/2021) for Follow up HTN [id number] patient needs Pap.    Vevelyn Francois, NP

## 2020-12-24 NOTE — Patient Instructions (Signed)
Diabetes Mellitus and Nutrition, Adult When you have diabetes, or diabetes mellitus, it is very important to have healthy eating habits because your blood sugar (glucose) levels are greatly affected by what you eat and drink. Eating healthy foods in the right amounts, at about the same times every day, can help you:  Control your blood glucose.  Lower your risk of heart disease.  Improve your blood pressure.  Reach or maintain a healthy weight. What can affect my meal plan? Every person with diabetes is different, and each person has different needs for a meal plan. Your health care provider may recommend that you work with a dietitian to make a meal plan that is best for you. Your meal plan may vary depending on factors such as:  The calories you need.  The medicines you take.  Your weight.  Your blood glucose, blood pressure, and cholesterol levels.  Your activity level.  Other health conditions you have, such as heart or kidney disease. How do carbohydrates affect me? Carbohydrates, also called carbs, affect your blood glucose level more than any other type of food. Eating carbs naturally raises the amount of glucose in your blood. Carb counting is a method for keeping track of how many carbs you eat. Counting carbs is important to keep your blood glucose at a healthy level, especially if you use insulin or take certain oral diabetes medicines. It is important to know how many carbs you can safely have in each meal. This is different for every person. Your dietitian can help you calculate how many carbs you should have at each meal and for each snack. How does alcohol affect me? Alcohol can cause a sudden decrease in blood glucose (hypoglycemia), especially if you use insulin or take certain oral diabetes medicines. Hypoglycemia can be a life-threatening condition. Symptoms of hypoglycemia, such as sleepiness, dizziness, and confusion, are similar to symptoms of having too much  alcohol.  Do not drink alcohol if: ? Your health care provider tells you not to drink. ? You are pregnant, may be pregnant, or are planning to become pregnant.  If you drink alcohol: ? Do not drink on an empty stomach. ? Limit how much you use to:  0-1 drink a day for women.  0-2 drinks a day for men. ? Be aware of how much alcohol is in your drink. In the [place] one drink equals one 12 oz bottle of beer (355 mL), one 5 oz glass of wine (148 mL), or one 1 oz glass of hard liquor (44 mL). ? Keep yourself hydrated with water, diet soda, or unsweetened iced tea.  Keep in mind that regular soda, juice, and other mixers may contain a lot of sugar and must be counted as carbs. What are tips for following this plan? Reading food labels  Start by checking the serving size on the "Nutrition Facts" label of packaged foods and drinks. The amount of calories, carbs, fats, and other nutrients listed on the label is based on one serving of the item. Many items contain more than one serving per package.  Check the total grams (g) of carbs in one serving. You can calculate the number of servings of carbs in one serving by dividing the total carbs by 15. For example, if a food has 30 g of total carbs per serving, it would be equal to 2 servings of carbs.  Check the number of grams (g) of saturated fats and trans fats in one serving. Choose foods that have   a low amount or none of these fats.  Check the number of milligrams (mg) of salt (sodium) in one serving. Most people should limit total sodium intake to less than 2,300 mg per day.  Always check the nutrition information of foods labeled as "low-fat" or "nonfat." These foods may be higher in added sugar or refined carbs and should be avoided.  Talk to your dietitian to identify your daily goals for nutrients listed on the label. Shopping  Avoid buying canned, pre-made, or processed foods. These foods tend to be high in fat, sodium, and added  sugar.  Shop around the outside edge of the grocery store. This is where you will most often find fresh fruits and vegetables, bulk grains, fresh meats, and fresh dairy. Cooking  Use low-heat cooking methods, such as baking, instead of high-heat cooking methods like deep frying.  Cook using healthy oils, such as olive, canola, or sunflower oil.  Avoid cooking with butter, cream, or high-fat meats. Meal planning  Eat meals and snacks regularly, preferably at the same times every day. Avoid going long periods of time without eating.  Eat foods that are high in fiber, such as fresh fruits, vegetables, beans, and whole grains. Talk with your dietitian about how many servings of carbs you can eat at each meal.  Eat 4-6 oz (112-168 g) of lean protein each day, such as lean meat, chicken, fish, eggs, or tofu. One ounce (oz) of lean protein is equal to: ? 1 oz (28 g) of meat, chicken, or fish. ? 1 egg. ?  cup (62 g) of tofu.  Eat some foods each day that contain healthy fats, such as avocado, nuts, seeds, and fish.   What foods should I eat? Fruits Berries. Apples. Oranges. Peaches. Apricots. Plums. Grapes. Mango. Papaya. Pomegranate. Kiwi. Cherries. Vegetables Lettuce. Spinach. Leafy greens, including kale, chard, collard greens, and mustard greens. Beets. Cauliflower. Cabbage. Broccoli. Carrots. Green beans. Tomatoes. Peppers. Onions. Cucumbers. Brussels sprouts. Grains Whole grains, such as whole-wheat or whole-grain bread, crackers, tortillas, cereal, and pasta. Unsweetened oatmeal. Quinoa. Brown or wild rice. Meats and other proteins Seafood. Poultry without skin. Lean cuts of poultry and beef. Tofu. Nuts. Seeds. Dairy Low-fat or fat-free dairy products such as milk, yogurt, and cheese. The items listed above may not be a complete list of foods and beverages you can eat. Contact a dietitian for more information. What foods should I avoid? Fruits Fruits canned with  syrup. Vegetables Canned vegetables. Frozen vegetables with butter or cream sauce. Grains Refined white flour and flour products such as bread, pasta, snack foods, and cereals. Avoid all processed foods. Meats and other proteins Fatty cuts of meat. Poultry with skin. Breaded or fried meats. Processed meat. Avoid saturated fats. Dairy Full-fat yogurt, cheese, or milk. Beverages Sweetened drinks, such as soda or iced tea. The items listed above may not be a complete list of foods and beverages you should avoid. Contact a dietitian for more information. Questions to ask a health care provider  Do I need to meet with a diabetes educator?  Do I need to meet with a dietitian?  What number can I call if I have questions?  When are the best times to check my blood glucose? Where to find more information:  American Diabetes Association: diabetes.org  Academy of Nutrition and Dietetics: www.eatright.org  National Institute of Diabetes and Digestive and Kidney Diseases: www.niddk.nih.gov  Association of Diabetes Care and Education Specialists: www.diabeteseducator.org Summary  It is important to have healthy eating   habits because your blood sugar (glucose) levels are greatly affected by what you eat and drink.  A healthy meal plan will help you control your blood glucose and maintain a healthy lifestyle.  Your health care provider may recommend that you work with a dietitian to make a meal plan that is best for you.  Keep in mind that carbohydrates (carbs) and alcohol have immediate effects on your blood glucose levels. It is important to count carbs and to use alcohol carefully. This information is not intended to replace advice given to you by your health care provider. Make sure you discuss any questions you have with your health care provider. Document Revised: 04/02/2019 Document Reviewed: 04/02/2019 Elsevier Patient Education  2021 Elsevier Inc.  

## 2020-12-25 LAB — COMP. METABOLIC PANEL (12)
AST: 18 IU/L (ref 0–40)
Albumin/Globulin Ratio: 1.6 (ref 1.2–2.2)
Albumin: 4 g/dL (ref 3.8–4.8)
Alkaline Phosphatase: 102 IU/L (ref 44–121)
BUN/Creatinine Ratio: 13 (ref 9–23)
BUN: 11 mg/dL (ref 6–24)
Bilirubin Total: <0.2 mg/dL (ref 0.0–1.2)
Calcium: 9.4 mg/dL (ref 8.7–10.2)
Chloride: 104 mmol/L (ref 96–106)
Creatinine, Ser: 0.87 mg/dL (ref 0.57–1.00)
Globulin, Total: 2.5 g/dL (ref 1.5–4.5)
Glucose: 94 mg/dL (ref 65–99)
Potassium: 4.2 mmol/L (ref 3.5–5.2)
Sodium: 141 mmol/L (ref 134–144)
Total Protein: 6.5 g/dL (ref 6.0–8.5)
eGFR: 82 mL/min/{1.73_m2} (ref >59)

## 2020-12-25 LAB — LIPID PANEL
Chol/HDL Ratio: 4.5 ratio — ABNORMAL HIGH (ref 0.0–4.4)
Cholesterol, Total: 211 mg/dL — ABNORMAL HIGH (ref 100–199)
HDL: 47 mg/dL (ref >39)
LDL Chol Calc (NIH): 132 mg/dL — ABNORMAL HIGH (ref 0–99)
Triglycerides: 180 mg/dL — ABNORMAL HIGH (ref 0–149)
VLDL Cholesterol Cal: 32 mg/dL (ref 5–40)

## 2021-01-18 IMAGING — CT CT ANGIO CHEST
2 of 6 series · 19 of 46 positions shown · IV contrast (OMNIPAQUE)
Comparison: None.

CLINICAL DATA: Shortness of breath, COVID, hypoxia

EXAM:
CT ANGIOGRAPHY CHEST WITH CONTRAST
TECHNIQUE: Multidetector CT imaging of the chest was performed using the
standard protocol during bolus administration of intravenous
contrast. Multiplanar CT image reconstructions and MIPs were
obtained to evaluate the vascular anatomy.
CONTRAST:  100mL OMNIPAQUE IOHEXOL 350 MG/ML SOLN

[Series 7: thins · axial · 0.61mm/px · z∈[-316,-64]mm · 17 of 276 slices shown]
[im 12/276  lung]
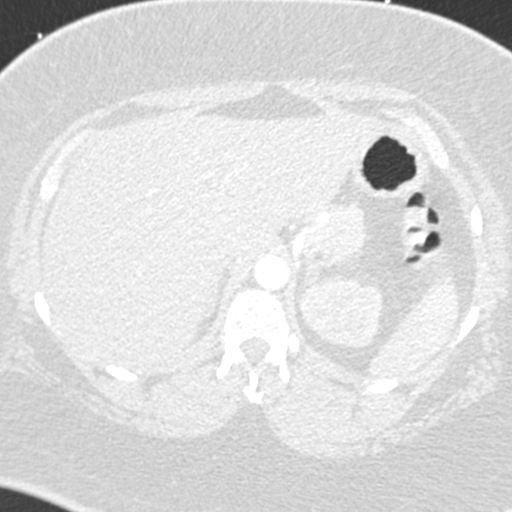
[im 24/276  soft-tissue]
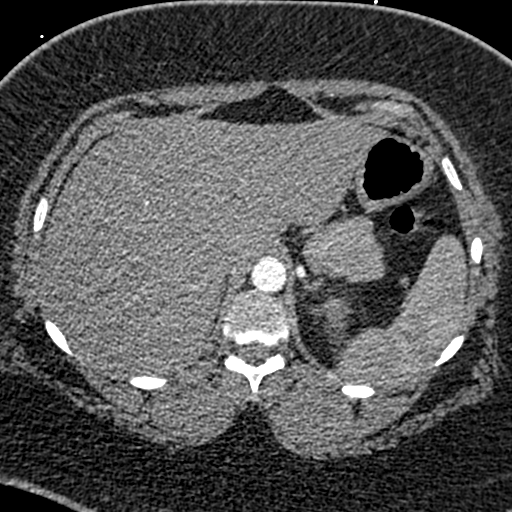
[im 48/276  lung]
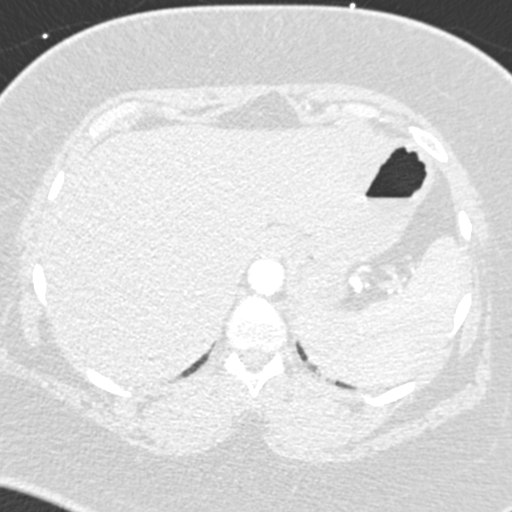
[im 60/276  soft-tissue]
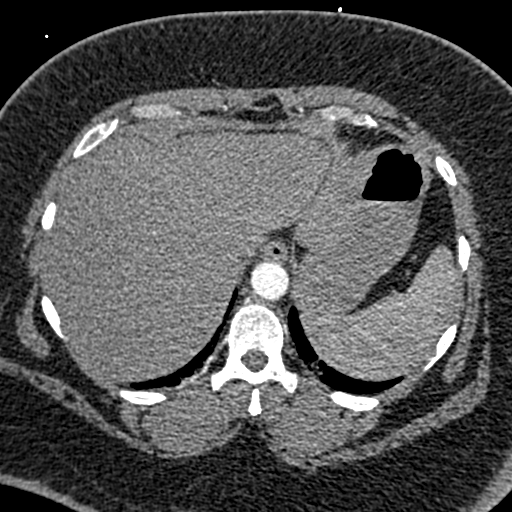
[im 72/276  lung]
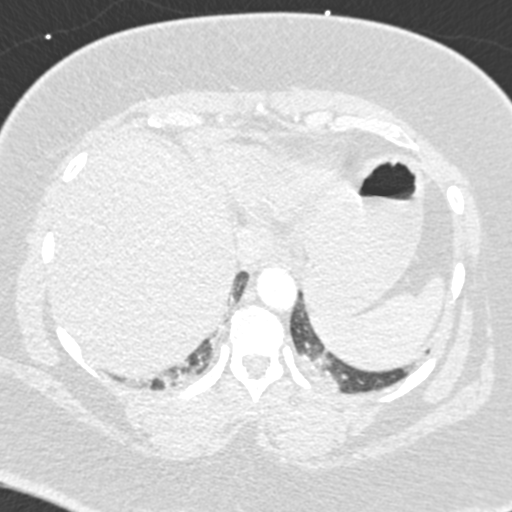
[im 96/276  soft-tissue]
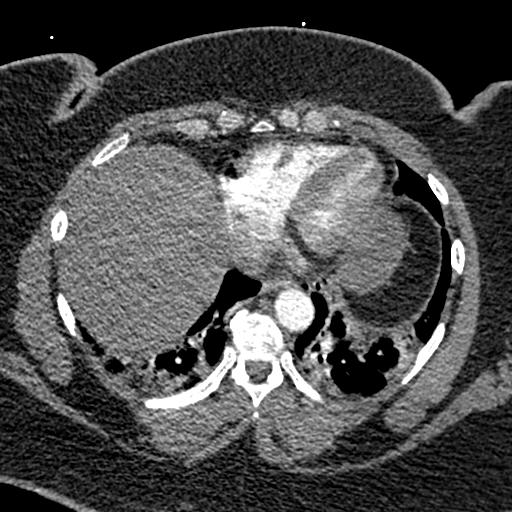
[im 108/276  lung]
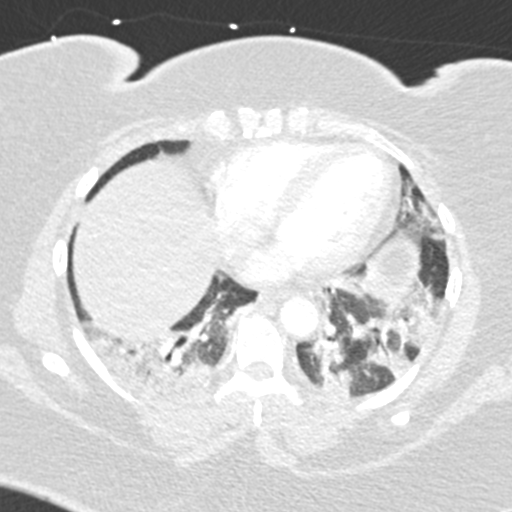
[im 120/276  soft-tissue]
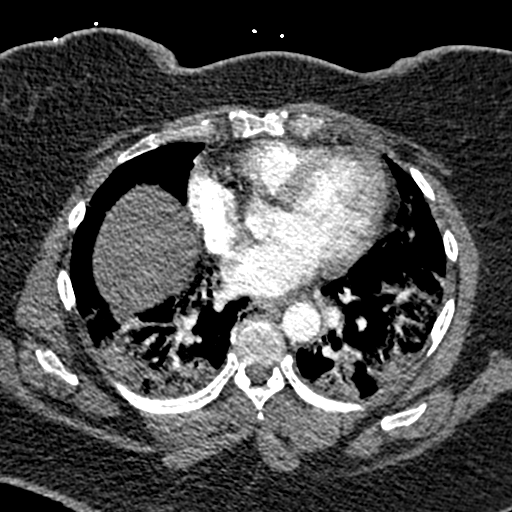
[im 144/276  lung]
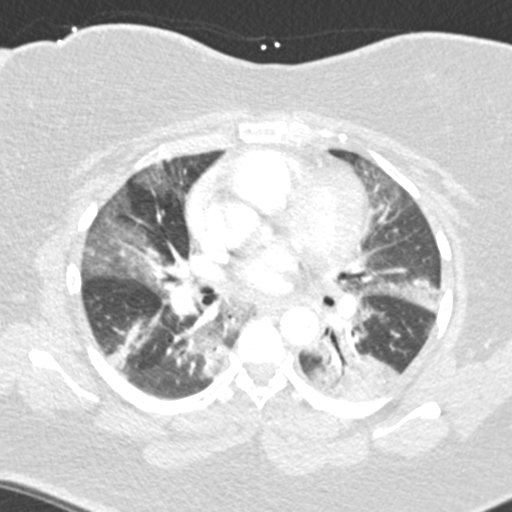
[im 156/276  soft-tissue]
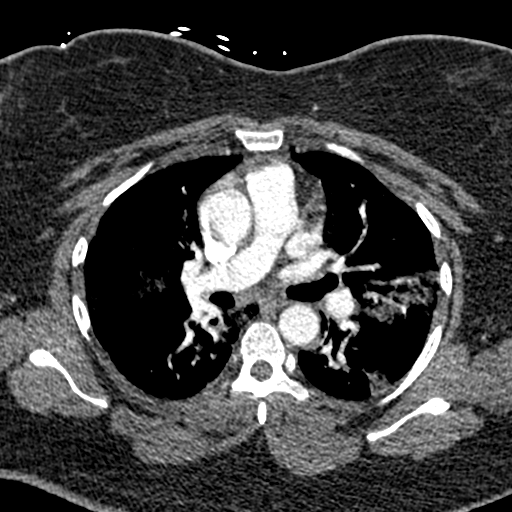
[im 168/276  lung]
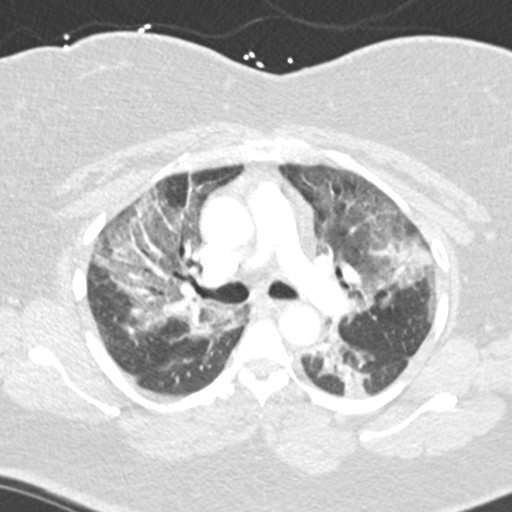
[im 180/276  soft-tissue]
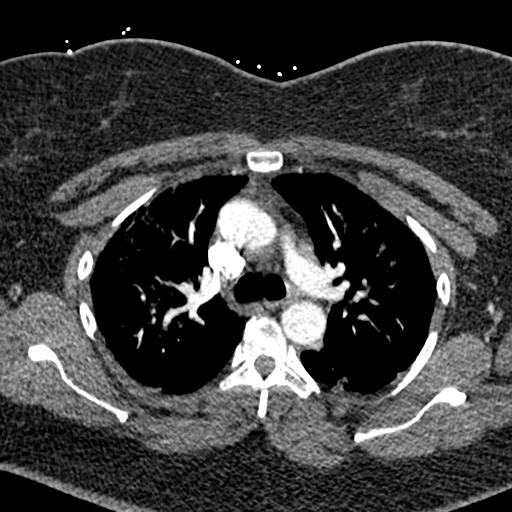
[im 204/276  lung]
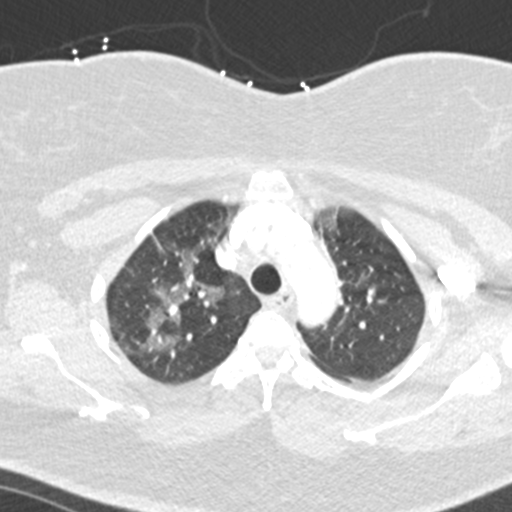
[im 216/276  soft-tissue]
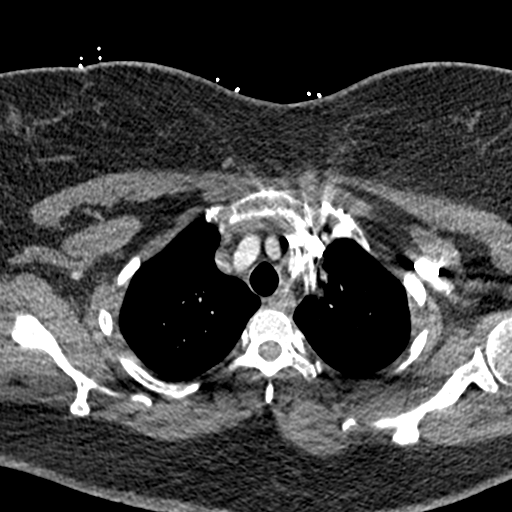
[im 228/276  lung]
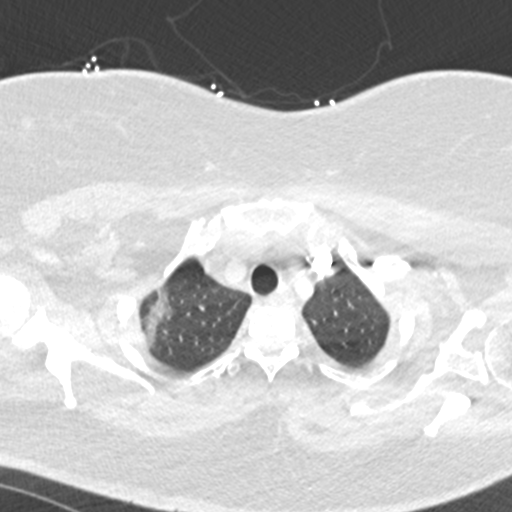
[im 252/276  soft-tissue]
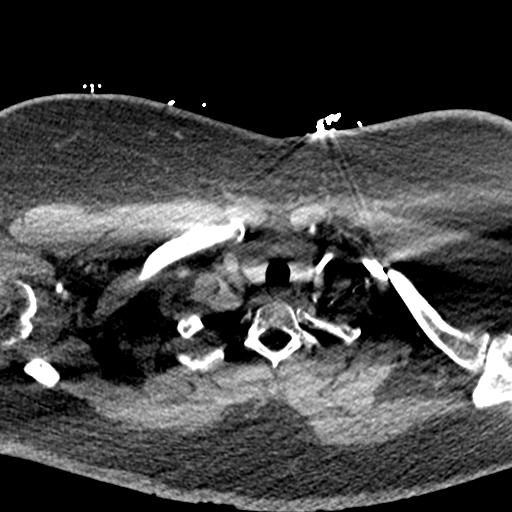
[im 264/276  lung]
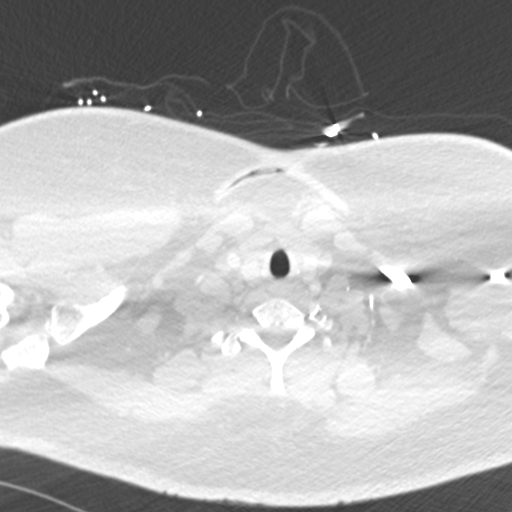

[Series 8: coronal mpr · coronal · 0.52mm/px · 2 of 75 slices shown]
[im 25/75  soft-tissue]
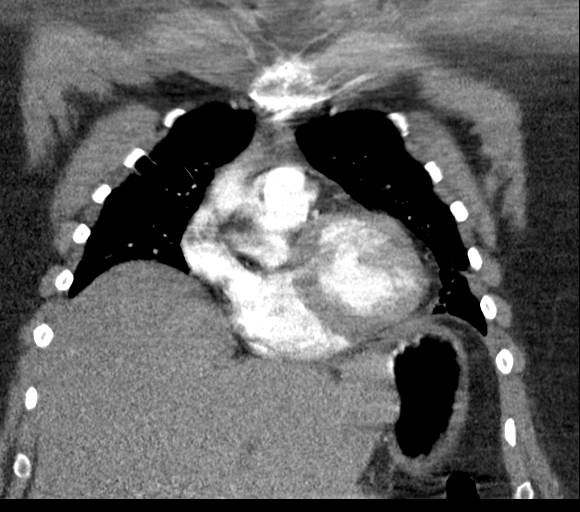
[im 50/75  soft-tissue]
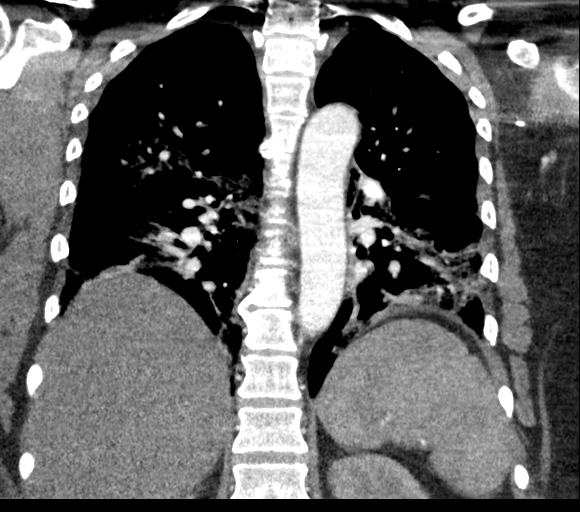

[19 of 46 positions shown; findings below may reference images not displayed]

FINDINGS: Cardiovascular: Heart size normal. No pericardial effusion.
Satisfactory opacification of pulmonary arteries noted, and there is
no evidence of pulmonary emboli. Adequate contrast opacification of
the thoracic aorta with no evidence of dissection, aneurysm, or
stenosis. There is classic 3-vessel brachiocephalic arch anatomy
without proximal stenosis. No significant atheromatous change.

Mediastinum/Nodes: No significant hilar or mediastinal adenopathy.

Lungs/Pleura: No pleural effusion. No pneumothorax. Extensive
ground-glass opacities scattered throughout both lungs, involving
bases more than apices. Airspace consolidation posteriorly in both
lower lobes with peripheral air bronchograms.

Upper Abdomen: No acute findings.

Musculoskeletal: No chest wall abnormality. No acute or significant
osseous findings.

Review of the MIP images confirms the above findings.
IMPRESSION: 1. Negative for acute PE or thoracic aortic dissection.
2. Extensive ground-glass opacities scattered throughout both lungs,
with consolidation posteriorly in both lower lobes, consistent with
atypical/viral pneumonia.

## 2021-01-19 IMAGING — DX DG CHEST 1V PORT
1 series · 1 of 1 positions shown · non-contrast
Comparison: CTA of the chest on 08/03/2019

CLINICAL DATA: Respiratory failure and tachycardia. COVID
pneumonia.

EXAM:
PORTABLE CHEST 1 VIEW

[chest ap]
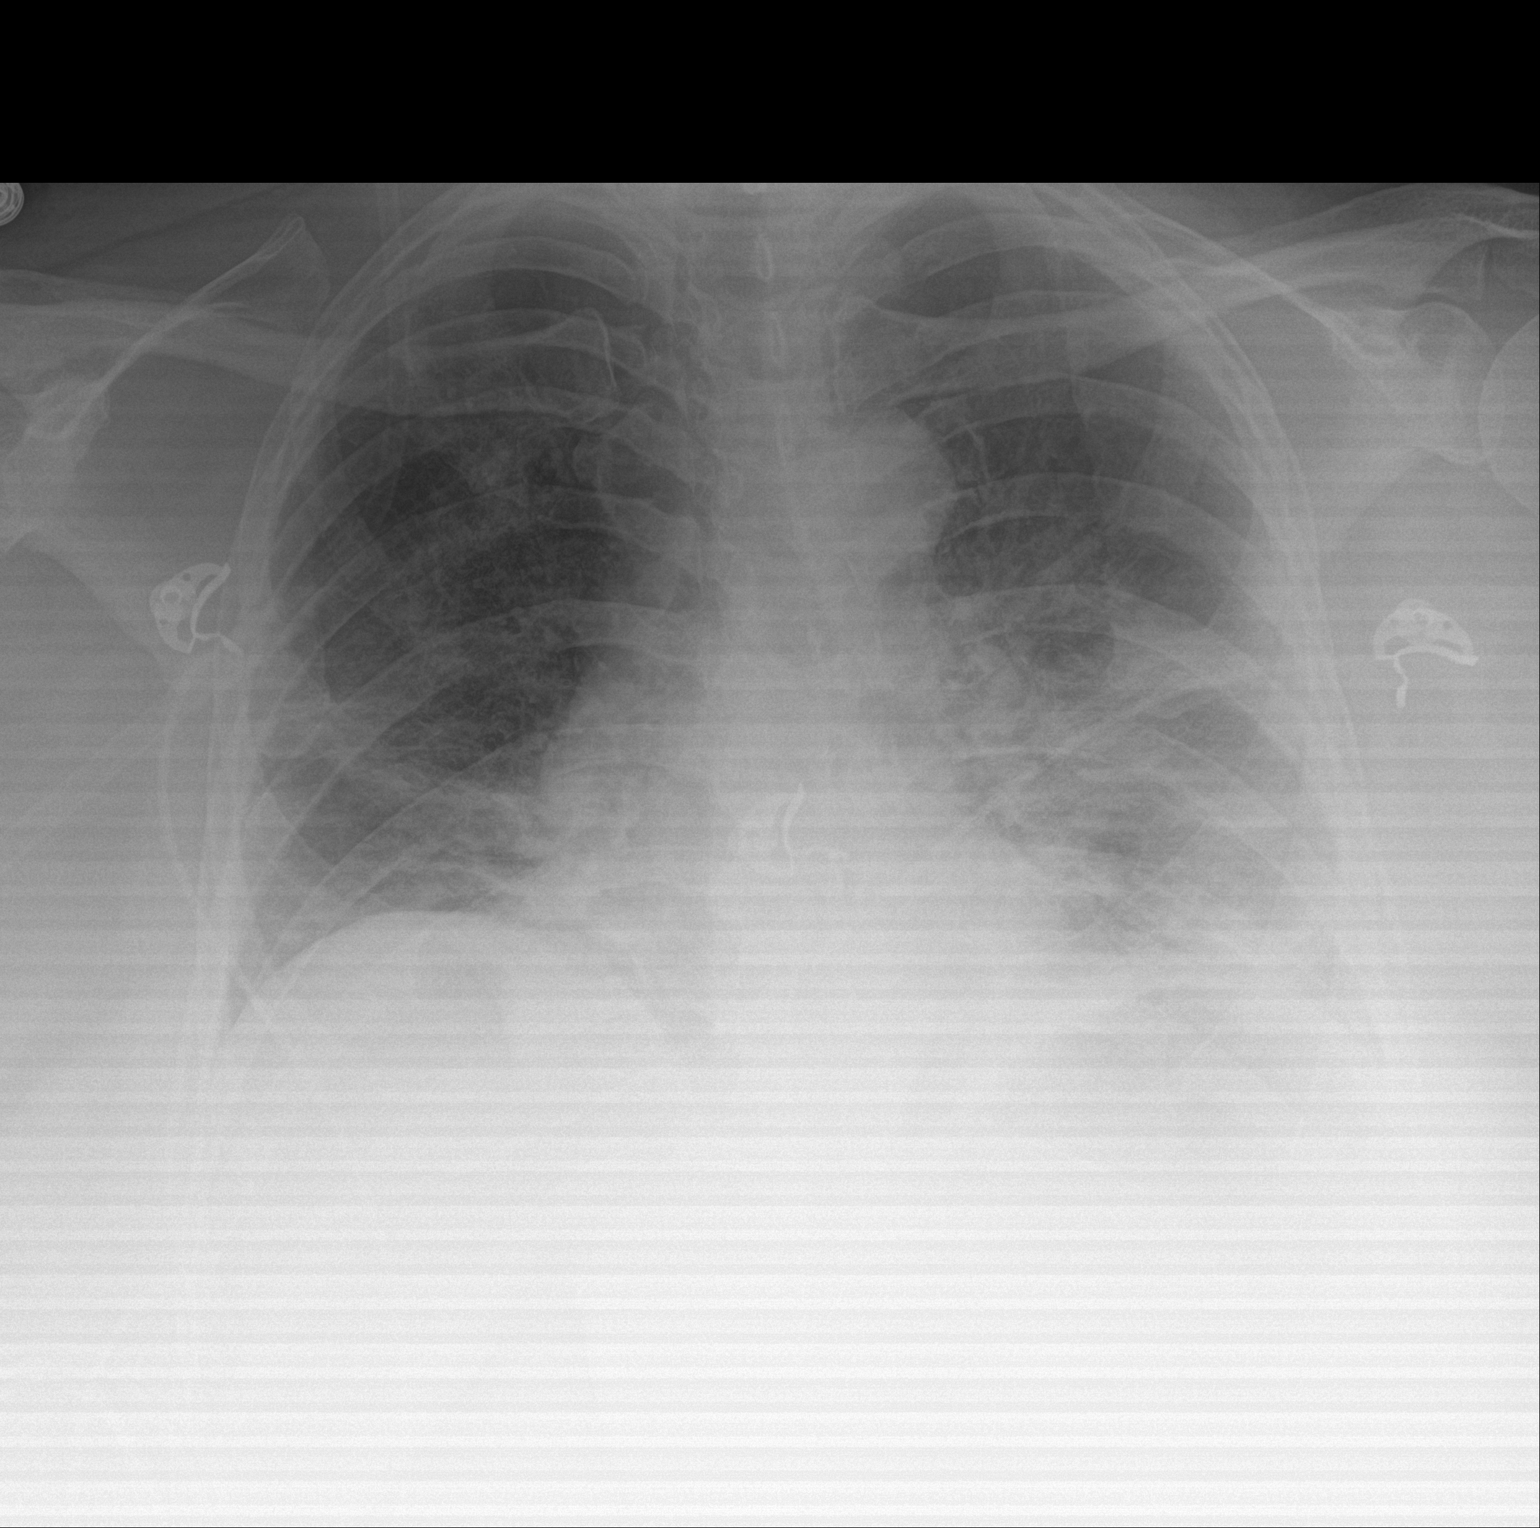

[1 of 1 positions shown; findings below may reference images not displayed]

FINDINGS: The heart size and mediastinal contours are within normal limits.
Bilateral airspace disease again noted which is more severe in the
left lung by chest x-ray compared to the right. No significant
pleural fluid. No pneumothorax. Visualized skeletal structures are
unremarkable.
IMPRESSION: Stable bilateral airspace disease, more severe on the left.

## 2021-02-02 ENCOUNTER — Other Ambulatory Visit: Payer: Self-pay

## 2021-02-02 DIAGNOSIS — I10 Essential (primary) hypertension: Secondary | ICD-10-CM

## 2021-02-02 DIAGNOSIS — R7303 Prediabetes: Secondary | ICD-10-CM

## 2021-02-02 MED ORDER — METFORMIN HCL 500 MG PO TABS: 500.0000 mg | ORAL_TABLET | Freq: Two times a day (BID) | ORAL | 3 refills | Status: DC

## 2021-02-17 ENCOUNTER — Other Ambulatory Visit (HOSPITAL_COMMUNITY): Payer: Self-pay | Admitting: Psychiatry

## 2021-02-17 DIAGNOSIS — F3181 Bipolar II disorder: Secondary | ICD-10-CM

## 2021-02-18 ENCOUNTER — Other Ambulatory Visit (HOSPITAL_COMMUNITY): Payer: Self-pay | Admitting: Psychiatry

## 2021-02-18 DIAGNOSIS — F3181 Bipolar II disorder: Secondary | ICD-10-CM

## 2021-02-19 ENCOUNTER — Other Ambulatory Visit: Payer: Self-pay

## 2021-02-19 ENCOUNTER — Telehealth (INDEPENDENT_AMBULATORY_CARE_PROVIDER_SITE_OTHER): Payer: No Payment, Other | Admitting: Psychiatry

## 2021-02-19 ENCOUNTER — Encounter (HOSPITAL_COMMUNITY): Payer: Self-pay | Admitting: Psychiatry

## 2021-02-19 DIAGNOSIS — F3181 Bipolar II disorder: Secondary | ICD-10-CM | POA: Diagnosis not present

## 2021-02-19 MED ORDER — LATUDA 60 MG PO TABS: ORAL_TABLET | ORAL | 3 refills | Status: DC

## 2021-02-19 MED ORDER — GABAPENTIN 100 MG PO CAPS: ORAL_CAPSULE | ORAL | 3 refills | Status: DC

## 2021-02-19 MED ORDER — MELATONIN 5 MG PO TABS: 5.0000 mg | ORAL_TABLET | Freq: Every evening | ORAL | 3 refills | Status: DC | PRN

## 2021-02-19 NOTE — Progress Notes (Signed)
BH MD/PA/NP OP Progress Note Virtual Visit via Video Note  I connected with Christie Walker on 02/19/21 at 11:00 AM EDT by a video enabled telemedicine application and verified that I am speaking with the correct person using two identifiers.  Location: Patient: Home Provider: Clinic   I discussed the limitations of evaluation and management by telemedicine and the availability of in person appointments. The patient expressed understanding and agreed to proceed.  I provided of non-face-to-face time during this encounter.     02/19/2021 11:14 AM Christie Walker  MRN:  [id number]  Chief Complaint: "Im doing okay".   HPI:  50 year old female seen today for follow up psychiatric evaluation. She has a psychiatric history of bipolar disorder and and schizophrenia.  She is currently managed on gabapentin 100 mg twice daily, Melatonin 5 mg nightly, and Latuda 60 mg daily.  She notes her medications are effective in managing her psychiatric conditions.   Today patient is pleasant, calm, cooperative, maintained eye contact, and engaged in conversation. She informed provider that she has been doing well since her last visit. She notes that she continues to work and is bothered by her coworkers at times but notes that oval all things are okay. She informed Clinical research associate that she is looking forward to her birthday next week. She reports that she will be celebrating with her family at a dinner.  Today provider conducted a GAD-7 and patient scored a 6, at her last visit she scored a 5.  Provider also conducted a PHQ-9 and patient scored a 2, at her last visit she scored a 0.  Today she denies SI/HI/VAH, mania, or paranoia.  She endorses adequate appetite and sleep.  No medication changes made today.  Patient agreeable to continue medications as scribed.  She will follow-up with outpatient counseling for therapy.  No other concerns at this time. No other concerns noted at this time.  Visit  Diagnosis:    ICD-10-CM   1. Bipolar 2 disorder (HCC)  F31.81 gabapentin (NEURONTIN) 100 MG capsule    Lurasidone HCl (LATUDA) 60 MG TABS    melatonin 5 MG TABS      Past Psychiatric History:  bipolar disorder and and schizophrenia  Past Medical History:  Past Medical History:  Diagnosis Date   Acid reflux    Bipolar 1 disorder (HCC)    Essential hypertension 06/14/2016   History reviewed. No pertinent surgical history.  Family Psychiatric History: Paternal aunts bipolar disorder and father has mental health conditions but unaware which one.   Family History:  Family History  Problem Relation Age of Onset   Mental illness Other     Social History:  Social History   Socioeconomic History   Marital status: Legally Separated    Spouse name: Not on file   Number of children: Not on file   Years of education: Not on file   Highest education level: Not on file  Occupational History   Not on file  Tobacco Use   Smoking status: Never   Smokeless tobacco: Never  Vaping Use   Vaping Use: Never used  Substance and Sexual Activity   Alcohol use: No   Drug use: No   Sexual activity: Not Currently    Birth control/protection: None  Other Topics Concern   Not on file  Social History Narrative   Not on file   Social Determinants of Health   Financial Resource Strain: Not on file  Food Insecurity: Not on file  Transportation  Needs: Not on file  Physical Activity: Not on file  Stress: Not on file  Social Connections: Not on file    Allergies:  Allergies  Allergen Reactions   Bee Venom Swelling    Swelling at site    Amoxicillin Hives and Other (See Comments)    Patient states that she can take this she just has a sensitivity when its taken along with pain medication   Hydrocodone Hives and Other (See Comments)    Sensitivity when taken along with antibiotics per patient   Insect Extract Allergy Skin Test Other (See Comments)    Spiders - make tongue burn    Propoxyphene N-Acetaminophen Nausea And Vomiting and Rash    ** Darvacet    Metabolic Disorder Labs: Lab Results  Component Value Date   HGBA1C 5.8 (A) 12/24/2020   MPG 139.85 08/06/2019   MPG 128.37 08/01/2019   No results found for: PROLACTIN Lab Results  Component Value Date   CHOL 211 (H) 12/24/2020   TRIG 180 (H) 12/24/2020   HDL 47 12/24/2020   CHOLHDL 4.5 (H) 12/24/2020   VLDL 32 (H) 06/14/2016   LDLCALC 132 (H) 12/24/2020   LDLCALC 159 (H) 08/06/2020   Lab Results  Component Value Date   TSH 1.080 10/04/2019   TSH 0.262 (L) 08/06/2019    Therapeutic Level Labs: No results found for: LITHIUM No results found for: VALPROATE No components found for:  CBMZ  Current Medications: Current Outpatient Medications  Medication Sig Dispense Refill   acetaminophen (TYLENOL) 325 MG tablet Take 650 mg by mouth every 6 (six) hours as needed for mild pain or headache.     fluticasone (FLONASE) 50 MCG/ACT nasal spray Place 2 sprays into both nostrils daily. 9.9 g 0   furosemide (LASIX) 20 MG tablet TAKE 1 TABLET(20 MG) BY MOUTH DAILY 30 tablet 5   gabapentin (NEURONTIN) 100 MG capsule TAKE 1 CAPSULE(100 MG) BY MOUTH TWICE DAILY 60 capsule 3   HYDROcodone-acetaminophen (HYCET) 7.5-325 mg/15 ml solution Take 10 mLs by mouth every 6 (six) hours as needed for moderate pain or severe pain. (Patient not taking: Reported on 12/24/2020) 120 mL 0   IRON PO Take 1 tablet by mouth daily.     Lurasidone HCl (LATUDA) 60 MG TABS TAKE 1 TABLET(60 MG) BY MOUTH DAILY 30 tablet 3   melatonin 5 MG TABS Take 1 tablet (5 mg total) by mouth at bedtime as needed. 60 tablet 3   metFORMIN (GLUCOPHAGE) 500 MG tablet Take 1 tablet (500 mg total) by mouth 2 (two) times daily with a meal. 180 tablet 3   naproxen (NAPROSYN) 500 MG tablet TAKE 1 TABLET(500 MG) BY MOUTH TWICE DAILY WITH A MEAL 30 tablet 5   nystatin (MYCOSTATIN/NYSTOP) powder Apply 1 application topically 3 (three) times daily. 15 g 0    nystatin-triamcinolone ointment (MYCOLOG) Apply 1 application topically 2 (two) times daily. 30 g 0   No current facility-administered medications for this visit.     Musculoskeletal: Strength & Muscle Tone:  Unable to assess due to telehealth visit Gait & Station:  Unable to assess due to telehealth visit Patient leans: N/A  Psychiatric Specialty Exam: Review of Systems  There were no vitals taken for this visit.There is no height or weight on file to calculate BMI.  General Appearance: Well Groomed  Eye Contact:  Good  Speech:  Clear and Coherent and Normal Rate  Volume:  Normal  Mood:  Euthymic  Affect:  Appropriate and Congruent  Thought Process:  Coherent, Goal Directed and Linear  Orientation:  Full (Time, Place, and Person)  Thought Content: WDL and Logical   Suicidal Thoughts:  No  Homicidal Thoughts:  No  Memory:  Immediate;   Good Recent;   Good Remote;   Good  Judgement:  Good  Insight:  Good  Psychomotor Activity:  Normal  Concentration:  Concentration: Good and Attention Span: Good  Recall:  Good  Fund of Knowledge: Good  Language: Good  Akathisia:  No  Handed:  Right  AIMS (if indicated): Not done  Assets:  Communication Skills Desire for Improvement Financial Resources/Insurance Housing Social Support  ADL's:  Intact  Cognition: WNL  Sleep:  Good   Screenings: GAD-7    Flowsheet Row Video Visit from 02/19/2021 in [facility] Video Visit from 11/20/2020 in [facility] Video Visit from 08/21/2020 in [facility] Video Visit from 05/22/2020 in [facility] Office Visit from 04/10/2020 in [facility]  Total GAD-7 Score 6 5 2 2  0      PHQ2-9    Flowsheet Row Video Visit from 02/19/2021 in [facility] Office Visit from 12/24/2020 in [facility] Video Visit from 11/20/2020 in  [facility] Video Visit from 08/21/2020 in [facility] Video Visit from 05/22/2020 in [facility]  PHQ-2 Total Score 0 0 0 0 0  PHQ-9 Total Score 2 -- 0 3 1      Flowsheet Row Video [facility] 08/21/2020 in [facility]  C-SSRS RISK CATEGORY No Risk No Risk        Assessment and Plan: Patient notes that over all she is doing well.  No medication changes made today.  Patient agreeable to continue medications as prescribed.  1. Bipolar 2 disorder (HCC)  Continue- gabapentin (NEURONTIN) 100 MG capsule; Take 1 capsule (100 mg total) by mouth 2 (two) times daily.  Dispense: 60 capsule; Refill: 3 Continue- lurasidone 60 MG TABS; Take 1 tablet (60 mg total) by mouth daily.  Dispense: 30 tablet; Refill: 3 Increased- melatonin 5 MG TABS; Take 1 tablet (5 mg total) by mouth at bedtime as needed.  Dispense: 60 tablet; Refill: 3  Follow-up in 3 months  10/12/2020, NP  02/19/2021, 11:14 AM

## 2021-02-23 ENCOUNTER — Other Ambulatory Visit (HOSPITAL_BASED_OUTPATIENT_CLINIC_OR_DEPARTMENT_OTHER): Payer: Self-pay

## 2021-02-23 ENCOUNTER — Ambulatory Visit: Payer: Self-pay | Attending: Internal Medicine

## 2021-02-23 DIAGNOSIS — Z23 Encounter for immunization: Secondary | ICD-10-CM

## 2021-02-23 MED ORDER — INFLUENZA VAC SPLIT QUAD 0.5 ML IM SUSY
PREFILLED_SYRINGE | INTRAMUSCULAR | 0 refills | Status: DC
  Filled 2021-02-23: qty 0.5, 1d supply, fill #0

## 2021-02-23 MED ORDER — MODERNA COVID-19 BIVAL BOOSTER 50 MCG/0.5ML IM SUSP
INTRAMUSCULAR | 0 refills | Status: DC
  Filled 2021-02-23: qty 0.5, 1d supply, fill #0

## 2021-02-23 NOTE — Progress Notes (Signed)
   Covid-19 Vaccination Clinic  Name:  Lakeyia Surber    MRN: [id number] DOB: 1970/10/07  02/23/2021  Ms. Kniskern was observed post Covid-19 immunization for 15 minutes without incident. She was provided with Vaccine Information Sheet and instruction to access the V-Safe system.   Ms. Merida was instructed to call 911 with any severe reactions post vaccine: Difficulty breathing  Swelling of face and throat  A fast heartbeat  A bad rash all over body  Dizziness and weakness

## 2021-03-26 ENCOUNTER — Other Ambulatory Visit: Payer: Self-pay

## 2021-03-26 ENCOUNTER — Ambulatory Visit (INDEPENDENT_AMBULATORY_CARE_PROVIDER_SITE_OTHER): Payer: Self-pay | Admitting: Nurse Practitioner

## 2021-03-26 ENCOUNTER — Ambulatory Visit: Payer: Self-pay | Admitting: Nurse Practitioner

## 2021-03-26 ENCOUNTER — Encounter: Payer: Self-pay | Admitting: Nurse Practitioner

## 2021-03-26 VITALS — BP 133/88 | HR 83 | Temp 97.2°F | Ht 65.0 in | Wt 331.0 lb

## 2021-03-26 DIAGNOSIS — I10 Essential (primary) hypertension: Secondary | ICD-10-CM

## 2021-03-26 DIAGNOSIS — R600 Localized edema: Secondary | ICD-10-CM

## 2021-03-26 DIAGNOSIS — Z6841 Body Mass Index (BMI) 40.0 and over, adult: Secondary | ICD-10-CM

## 2021-03-26 DIAGNOSIS — E119 Type 2 diabetes mellitus without complications: Secondary | ICD-10-CM

## 2021-03-26 LAB — POCT GLYCOSYLATED HEMOGLOBIN (HGB A1C)
HbA1c POC (<> result, manual entry): 5.9 % (ref 4.0–5.6)
HbA1c, POC (controlled diabetic range): 5.9 % (ref 0.0–7.0)
HbA1c, POC (prediabetic range): 5.9 % (ref 5.7–6.4)
Hemoglobin A1C: 5.9 % — AB (ref 4.0–5.6)

## 2021-03-26 MED ORDER — FUROSEMIDE 20 MG PO TABS: 20.0000 mg | ORAL_TABLET | Freq: Every day | ORAL | 11 refills | Status: DC

## 2021-03-26 NOTE — Progress Notes (Signed)
[place], [place]  [address] Phone:  [phone]   Fax:  [phone]   Established Patient Office Visit  Subjective:  Patient ID: Christie Walker, female    DOB: 1970-10-26  Age: 50 y.o. MRN: [id number]  CC:  Chief Complaint  Patient presents with   Follow-up    Pt is here today for her follow up visit. No concerns or issues to discuss.    HPI Christie Walker presents for follow up. She  has a past medical history of Acid reflux, Bipolar 1 disorder ([place]), and Essential hypertension (06/14/2016).   She reports that she is dong well. She has been eating more chips. She has gained about 9 pounds.  She reports being compliant with her medications.  She has failed Wellbutrin in the past which was tried to help with curbing her appetite.  She does need some assistance. Denies headache, dizziness, visual changes, shortness of breath, dyspnea on exertion, chest pain, nausea, vomiting or any edema.   Past Medical History:  Diagnosis Date   Acid reflux    Bipolar 1 disorder ([place])    Essential hypertension 06/14/2016    History reviewed. No pertinent surgical history.  Family History  Problem Relation Age of Onset   Mental illness Other     Social History   Socioeconomic History   Marital status: Legally Separated    Spouse name: Not on file   Number of children: Not on file   Years of education: Not on file   Highest education level: Not on file  Occupational History   Not on file  Tobacco Use   Smoking status: Never   Smokeless tobacco: Never  Vaping Use   Vaping Use: Never used  Substance and Sexual Activity   Alcohol use: No   Drug use: No   Sexual activity: Not Currently    Birth control/protection: None  Other Topics Concern   Not on file  Social History Narrative   Not on file   Social Determinants of Health   Financial Resource Strain: Not on file  Food Insecurity: Not on file  Transportation  Needs: Not on file  Physical Activity: Not on file  Stress: Not on file  Social Connections: Not on file  Intimate Partner Violence: Not on file    Outpatient Medications Prior to Visit  Medication Sig Dispense Refill   acetaminophen (TYLENOL) 325 MG tablet Take 650 mg by mouth every 6 (six) hours as needed for mild pain or headache.     fluticasone (FLONASE) 50 MCG/ACT nasal spray Place 2 sprays into both nostrils daily. 9.9 g 0   gabapentin (NEURONTIN) 100 MG capsule TAKE 1 CAPSULE(100 MG) BY MOUTH TWICE DAILY 60 capsule 3   IRON PO Take 1 tablet by mouth daily.     Lurasidone HCl (LATUDA) 60 MG TABS TAKE 1 TABLET(60 MG) BY MOUTH DAILY 30 tablet 3   melatonin 5 MG TABS Take 1 tablet (5 mg total) by mouth at bedtime as needed. 60 tablet 3   metFORMIN (GLUCOPHAGE) 500 MG tablet Take 1 tablet (500 mg total) by mouth 2 (two) times daily with a meal. 180 tablet 3   naproxen (NAPROSYN) 500 MG tablet TAKE 1 TABLET(500 MG) BY MOUTH TWICE DAILY WITH A MEAL 30 tablet 5   furosemide (LASIX) 20 MG tablet TAKE 1 TABLET(20 MG) BY MOUTH DAILY 30 tablet 5   COVID-19 mRNA bivalent vaccine, Moderna, (MODERNA COVID-19 BIVAL BOOSTER) 50  MCG/0.5ML injection Inject into the muscle. (Patient not taking: Reported on 03/26/2021) 0.5 mL 0   HYDROcodone-acetaminophen (HYCET) 7.5-325 mg/15 ml solution Take 10 mLs by mouth every 6 (six) hours as needed for moderate pain or severe pain. (Patient not taking: Reported on 12/24/2020) 120 mL 0   influenza vac split quadrivalent PF (FLUARIX) 0.5 ML injection Inject into the muscle. (Patient not taking: Reported on 03/26/2021) 0.5 mL 0   nystatin (MYCOSTATIN/NYSTOP) powder Apply 1 application topically 3 (three) times daily. (Patient not taking: Reported on 03/26/2021) 15 g 0   nystatin-triamcinolone ointment (MYCOLOG) Apply 1 application topically 2 (two) times daily. (Patient not taking: Reported on 03/26/2021) 30 g 0   No facility-administered medications prior to visit.     Allergies  Allergen Reactions   Bee Venom Swelling    Swelling at site    Amoxicillin Hives and Other (See Comments)    Patient states that she can take this she just has a sensitivity when its taken along with pain medication   Hydrocodone Hives and Other (See Comments)    Sensitivity when taken along with antibiotics per patient   Insect Extract Allergy Skin Test Other (See Comments)    Spiders - make tongue burn   Propoxyphene N-Acetaminophen Nausea And Vomiting and Rash    ** Darvacet    ROS Review of Systems  Musculoskeletal:  Positive for arthralgias (hip pain), back pain and joint swelling.     Objective:    Physical Exam Constitutional:      Appearance: She is obese.  HENT:     Head: Normocephalic and atraumatic.     Nose: Nose normal.     Mouth/Throat:     Mouth: Mucous membranes are moist.  Cardiovascular:     Rate and Rhythm: Normal rate and regular rhythm.     Pulses: Normal pulses.     Heart sounds: Normal heart sounds.  Pulmonary:     Effort: Pulmonary effort is normal.     Breath sounds: Normal breath sounds.  Abdominal:     Palpations: Abdomen is soft.  Musculoskeletal:        General: Normal range of motion.     Cervical back: Normal range of motion.  Skin:    General: Skin is warm and dry.     Capillary Refill: Capillary refill takes less than 2 seconds.  Neurological:     General: No focal deficit present.     Mental Status: She is alert and oriented to person, place, and time.  Psychiatric:        Mood and Affect: Mood normal.        Behavior: Behavior normal.        Thought Content: Thought content normal.        Judgment: Judgment normal.    BP 133/88   Pulse 83   Temp (!) 97.2 F (36.2 C)   Ht $R'5\' 5"'zx$  (1.651 m)   Wt (!) 331 lb 0.6 oz (150.2 kg)   LMP  (LMP Unknown)   SpO2 99%   BMI 55.09 kg/m  Wt Readings from Last 3 Encounters:  03/26/21 (!) 331 lb 0.6 oz (150.2 kg)  12/24/20 (!) 322 lb 0.8 oz (146.1 kg)  09/17/20 (!) 320  lb 0.2 oz (145.2 kg)     Health Maintenance Due  Topic Date Due   OPHTHALMOLOGY EXAM  Never done   PAP SMEAR-Modifier  09/16/2019   URINE MICROALBUMIN  01/09/2021    There are no preventive care  reminders to display for this patient.  Lab Results  Component Value Date   TSH 1.080 10/04/2019   Lab Results  Component Value Date   WBC 4.8 01/10/2020   HGB 13.5 01/10/2020   HCT 42.7 01/10/2020   MCV 91 01/10/2020   PLT 218 01/10/2020   Lab Results  Component Value Date   NA 141 12/24/2020   K 4.2 12/24/2020   CO2 26 08/13/2019   GLUCOSE 94 12/24/2020   BUN 11 12/24/2020   CREATININE 0.87 12/24/2020   BILITOT <0.2 12/24/2020   ALKPHOS 102 12/24/2020   AST 18 12/24/2020   ALT 48 (H) 08/05/2019   PROT 6.5 12/24/2020   ALBUMIN 4.0 12/24/2020   CALCIUM 9.4 12/24/2020   ANIONGAP 10 08/13/2019   EGFR 82 12/24/2020   Lab Results  Component Value Date   CHOL 211 (H) 12/24/2020   Lab Results  Component Value Date   HDL 47 12/24/2020   Lab Results  Component Value Date   LDLCALC 132 (H) 12/24/2020   Lab Results  Component Value Date   TRIG 180 (H) 12/24/2020   Lab Results  Component Value Date   CHOLHDL 4.5 (H) 12/24/2020   Lab Results  Component Value Date   HGBA1C 5.9 (A) 03/26/2021   HGBA1C 5.9 03/26/2021   HGBA1C 5.9 03/26/2021   HGBA1C 5.9 03/26/2021      Assessment & Plan:   Problem List Items Addressed This Visit       Endocrine   Type 2 diabetes mellitus without complication, without long-term current use of insulin (HCC) - Primary Stable Encourage compliance with current treatment regimen  Dose adjustment we will add Crestor 5 mg daily encourage regular CBG monitoring Encourage contacting office if excessive hyperglycemia and or hypoglycemia Lifestyle modification with healthy diet (fewer calories, more high fiber foods, whole grains and non-starchy vegetables, lower fat meat and fish, low-fat diary include healthy oils) regular exercise  (physical activity) and weight loss Opthalmology exam discussed  Nutritional consult recommended Regular dental visits encouraged Home BP monitoring also encouraged goal <130/80    Relevant Orders   HgB A1c (Completed)     Other   OBESITY Obesity with BMI and comorbidities as noted above.  Discussed proper diet (low fat, low sodium, high fiber) with patient.   Discussed need for regular exercise (3 times per week, 20 minutes per session) with patient.    Other Visit Diagnoses     Bilateral lower extremity edema     Stable Continue with current regimen.  No changes warranted. Good patient compliance.   Relevant Medications   furosemide (LASIX) 20 MG tablet   Essential hypertension     Stable Encouraged on going compliance with current medication regimen Encouraged home monitoring and recording BP <130/80 Eating a heart-healthy diet with less salt Encouraged regular physical activity  Recommend Weight loss     Relevant Medications   furosemide (LASIX) 20 MG tablet       Meds ordered this encounter  Medications   furosemide (LASIX) 20 MG tablet    Sig: Take 1 tablet (20 mg total) by mouth daily. TAKE 1 TABLET(20 MG) BY MOUTH DAILY    Dispense:  30 tablet    Refill:  11    Order Specific Question:   Supervising Provider    Answer:   Quentin Angst [[id number]]     Follow-up: Return in about 3 months (around 06/26/2021) for Follow up HTN [id number].    Barbette Merino, NP

## 2021-03-26 NOTE — Patient Instructions (Signed)

## 2021-05-13 ENCOUNTER — Other Ambulatory Visit (HOSPITAL_COMMUNITY): Payer: Self-pay | Admitting: Psychiatry

## 2021-05-13 DIAGNOSIS — F3181 Bipolar II disorder: Secondary | ICD-10-CM

## 2021-05-24 ENCOUNTER — Telehealth (INDEPENDENT_AMBULATORY_CARE_PROVIDER_SITE_OTHER): Payer: No Payment, Other | Admitting: Psychiatry

## 2021-05-24 ENCOUNTER — Encounter (HOSPITAL_COMMUNITY): Payer: Self-pay | Admitting: Psychiatry

## 2021-05-24 DIAGNOSIS — F3181 Bipolar II disorder: Secondary | ICD-10-CM | POA: Diagnosis not present

## 2021-05-24 MED ORDER — MELATONIN 5 MG PO TABS: 5.0000 mg | ORAL_TABLET | Freq: Every evening | ORAL | 3 refills | Status: DC | PRN

## 2021-05-24 MED ORDER — LATUDA 60 MG PO TABS: ORAL_TABLET | ORAL | 3 refills | Status: DC

## 2021-05-24 MED ORDER — GABAPENTIN 100 MG PO CAPS: ORAL_CAPSULE | ORAL | 3 refills | Status: DC

## 2021-05-24 NOTE — Progress Notes (Addendum)
BH MD/PA/NP OP Progress Note Virtual Visit via Video Note  I connected with Christie Walker on 05/24/21 at 11:00 AM EST by a video enabled telemedicine application and verified that I am speaking with the correct person using two identifiers.  Location: Patient: Home Provider: Clinic   I discussed the limitations of evaluation and management by telemedicine and the availability of in person appointments. The patient expressed understanding and agreed to proceed.  I provided of non-face-to-face time during this encounter.     05/24/2021 11:22 AM Christie Walker  MRN:  [id number]  Chief Complaint: "I was told that my Christie Walker will  not be paid for anymore".   HPI:  51 year old female seen today for follow up psychiatric evaluation. She has a psychiatric history of bipolar disorder and and schizophrenia.  She is currently managed on gabapentin 100 mg twice daily, Melatonin 5 mg nightly, and Latuda 60 mg daily.  She notes her medications are effective in managing her psychiatric conditions.   Today patient is pleasant, calm, cooperative, maintained eye contact, and engaged in conversation. She informed provider that she is concerned because she received a message stating that her Christie Walker would not be covered as of June 28, 2021.  She reports that she feels mentally stable and does not want to decline because of lack of medication or being unable to afford it.  Provider informed patient she may be able to get her medications from community health and wellness.  Provider sent pharmacy message and was informed by pharmacy technician K.Courtney Paris that was without insurance patient will be charged $10 and with insurance the cost would depends on her co-pay.  She endorsed understanding and agreement.    Patient informed Clinical research associate that despite the stressors she continues to have minimal anxiety and depression.  Provider conducted a GAD-7 and patient scored a 5, at her last visit she  scored a 6.  Provider also conducted PHQ-9 and patient scored a 2, at last visit she scored a 2.  She endorsed increased appetite and notes that at times her weight fluctuates.  She also informed writer that his sleep has been adequate.  Today she denies SI/HI/VAH, mania, or paranoia.    Patient given a 90-day supply of Latuda 60 mg to prevent lapse in medication.  Patient instructed to call community health and wellness to start the patient care assistance program for [place].  She endorsed understanding and agreed.  At this time no medication changes made.  Patient will follow-up outpatient counseling for therapy.  No other concerns at this time     Visit Diagnosis:    ICD-10-CM   1. Bipolar 2 disorder (HCC)  F31.81 gabapentin (NEURONTIN) 100 MG capsule    Lurasidone HCl (LATUDA) 60 MG TABS    melatonin 5 MG TABS      Past Psychiatric History:  bipolar disorder and and schizophrenia  Past Medical History:  Past Medical History:  Diagnosis Date   Acid reflux    Bipolar 1 disorder (HCC)    Essential hypertension 06/14/2016   History reviewed. No pertinent surgical history.  Family Psychiatric History: Paternal aunts bipolar disorder and father has mental health conditions but unaware which one.   Family History:  Family History  Problem Relation Age of Onset   Mental illness Other     Social History:  Social History   Socioeconomic History   Marital status: Legally Separated    Spouse name: Not on file   Number of children: Not on  file   Years of education: Not on file   Highest education level: Not on file  Occupational History   Not on file  Tobacco Use   Smoking status: Never   Smokeless tobacco: Never  Vaping Use   Vaping Use: Never used  Substance and Sexual Activity   Alcohol use: No   Drug use: No   Sexual activity: Not Currently    Birth control/protection: None  Other Topics Concern   Not on file  Social History Narrative   Not on file   Social  Determinants of Health   Financial Resource Strain: Not on file  Food Insecurity: Not on file  Transportation Needs: Not on file  Physical Activity: Not on file  Stress: Not on file  Social Connections: Not on file    Allergies:  Allergies  Allergen Reactions   Bee Venom Swelling    Swelling at site    Amoxicillin Hives and Other (See Comments)    Patient states that she can take this she just has a sensitivity when its taken along with pain medication   Hydrocodone Hives and Other (See Comments)    Sensitivity when taken along with antibiotics per patient   Insect Extract Allergy Skin Test Other (See Comments)    Spiders - make tongue burn   Propoxyphene N-Acetaminophen Nausea And Vomiting and Rash    ** Darvacet    Metabolic Disorder Labs: Lab Results  Component Value Date   HGBA1C 5.9 (A) 03/26/2021   HGBA1C 5.9 03/26/2021   HGBA1C 5.9 03/26/2021   HGBA1C 5.9 03/26/2021   MPG 139.85 08/06/2019   MPG 128.37 08/01/2019   No results found for: PROLACTIN Lab Results  Component Value Date   CHOL 211 (H) 12/24/2020   TRIG 180 (H) 12/24/2020   HDL 47 12/24/2020   CHOLHDL 4.5 (H) 12/24/2020   VLDL 32 (H) 06/14/2016   LDLCALC 132 (H) 12/24/2020   LDLCALC 159 (H) 08/06/2020   Lab Results  Component Value Date   TSH 1.080 10/04/2019   TSH 0.262 (L) 08/06/2019    Therapeutic Level Labs: No results found for: LITHIUM No results found for: VALPROATE No components found for:  CBMZ  Current Medications: Current Outpatient Medications  Medication Sig Dispense Refill   acetaminophen (TYLENOL) 325 MG tablet Take 650 mg by mouth every 6 (six) hours as needed for mild pain or headache.     COVID-19 mRNA bivalent vaccine, Moderna, (MODERNA COVID-19 BIVAL BOOSTER) 50 MCG/0.5ML injection Inject into the muscle. (Patient not taking: Reported on 03/26/2021) 0.5 mL 0   fluticasone (FLONASE) 50 MCG/ACT nasal spray Place 2 sprays into both nostrils daily. 9.9 g 0   furosemide  (LASIX) 20 MG tablet Take 1 tablet (20 mg total) by mouth daily. TAKE 1 TABLET(20 MG) BY MOUTH DAILY 30 tablet 11   gabapentin (NEURONTIN) 100 MG capsule Take one tablet twice daily 60 capsule 3   HYDROcodone-acetaminophen (HYCET) 7.5-325 mg/15 ml solution Take 10 mLs by mouth every 6 (six) hours as needed for moderate pain or severe pain. (Patient not taking: Reported on 12/24/2020) 120 mL 0   influenza vac split quadrivalent PF (FLUARIX) 0.5 ML injection Inject into the muscle. (Patient not taking: Reported on 03/26/2021) 0.5 mL 0   IRON PO Take 1 tablet by mouth daily.     Lurasidone HCl (LATUDA) 60 MG TABS TAKE 1 TABLET(60 MG) BY MOUTH DAILY 90 tablet 3   melatonin 5 MG TABS Take 1 tablet (5 mg total) by  mouth at bedtime as needed. 60 tablet 3   metFORMIN (GLUCOPHAGE) 500 MG tablet Take 1 tablet (500 mg total) by mouth 2 (two) times daily with a meal. 180 tablet 3   naproxen (NAPROSYN) 500 MG tablet TAKE 1 TABLET(500 MG) BY MOUTH TWICE DAILY WITH A MEAL 30 tablet 5   nystatin (MYCOSTATIN/NYSTOP) powder Apply 1 application topically 3 (three) times daily. (Patient not taking: Reported on 03/26/2021) 15 g 0   nystatin-triamcinolone ointment (MYCOLOG) Apply 1 application topically 2 (two) times daily. (Patient not taking: Reported on 03/26/2021) 30 g 0   No current facility-administered medications for this visit.     Musculoskeletal: Strength & Muscle Tone:  Unable to assess due to telehealth visit Gait & Station:  Unable to assess due to telehealth visit Patient leans: N/A  Psychiatric Specialty Exam: Review of Systems  There were no vitals taken for this visit.There is no height or weight on file to calculate BMI.  General Appearance: Well Groomed  Eye Contact:  Good  Speech:  Clear and Coherent and Normal Rate  Volume:  Normal  Mood:  Euthymic  Affect:  Appropriate and Congruent  Thought Process:  Coherent, Goal Directed and Linear  Orientation:  Full (Time, Place, and Person)   Thought Content: WDL and Logical   Suicidal Thoughts:  No  Homicidal Thoughts:  No  Memory:  Immediate;   Good Recent;   Good Remote;   Good  Judgement:  Good  Insight:  Good  Psychomotor Activity:  Normal  Concentration:  Concentration: Good and Attention Span: Good  Recall:  Good  Fund of Knowledge: Good  Language: Good  Akathisia:  No  Handed:  Right  AIMS (if indicated): Not done  Assets:  Communication Skills Desire for Improvement Financial Resources/Insurance Housing Social Support  ADL's:  Intact  Cognition: WNL  Sleep:  Good   Screenings: GAD-7    Flowsheet Row Video Visit from 05/24/2021 in [facility] Video Visit from 02/19/2021 in [facility] Video Visit from 11/20/2020 in [facility] Video Visit from 08/21/2020 in [facility] Video Visit from 05/22/2020 in [facility]  Total GAD-7 Score 5 6 5 2 2       PHQ2-9    Flowsheet Row Video Visit from 05/24/2021 in [facility] Office Visit from 03/26/2021 in [facility] Video Visit from 02/19/2021 in [facility] Office Visit from 12/24/2020 in [facility] Video Visit from 11/20/2020 in [facility]  PHQ-2 Total Score 0 0 0 0 0  PHQ-9 Total Score 2 -- 2 -- 0      Flowsheet Row Video Visit from 08/21/2020 in [facility] ED from 08/12/2020 in [facility]  C-SSRS RISK CATEGORY No Risk No Risk        Assessment and Plan: Patient informed writer that she is concerned about lapse in obtaining Latuda. Patient given a 90-day supply of Latuda 60 mg to prevent lapse in medication.  Patient instructed to call community health and wellness to start the patient care assistance program for [place].  She endorsed  understanding and agreed.  At this time no medication changes made.  Patient will follow-up outpatient counseling for therapy 1. Bipolar 2 disorder (HCC)  Continue- gabapentin (NEURONTIN) 100 MG capsule; Take 1 capsule (100 mg total) by mouth 2 (two) times  daily.  Dispense: 60 capsule; Refill: 3 Continue- lurasidone 60 MG TABS; Take 1 tablet (60 mg total) by mouth daily.  Dispense: 90 tablet; Refill: 3 Continue- melatonin 5 MG TABS; Take 1 tablet (5 mg total) by mouth at bedtime as needed.  Dispense: 60 tablet; Refill: 3  Follow-up in 3 months  Shanna CiscoBrittney E Marlin Brys, NP  05/24/2021, 11:22 AM

## 2021-06-15 ENCOUNTER — Other Ambulatory Visit: Payer: Self-pay

## 2021-06-25 ENCOUNTER — Other Ambulatory Visit: Payer: Self-pay

## 2021-06-25 ENCOUNTER — Ambulatory Visit (INDEPENDENT_AMBULATORY_CARE_PROVIDER_SITE_OTHER): Payer: No Payment, Other | Admitting: Nurse Practitioner

## 2021-06-25 ENCOUNTER — Encounter: Payer: Self-pay | Admitting: Nurse Practitioner

## 2021-06-25 VITALS — BP 136/85 | HR 88 | Temp 97.4°F | Ht 65.0 in | Wt 347.0 lb

## 2021-06-25 DIAGNOSIS — Z6841 Body Mass Index (BMI) 40.0 and over, adult: Secondary | ICD-10-CM

## 2021-06-25 DIAGNOSIS — F3181 Bipolar II disorder: Secondary | ICD-10-CM | POA: Diagnosis not present

## 2021-06-25 DIAGNOSIS — E119 Type 2 diabetes mellitus without complications: Secondary | ICD-10-CM

## 2021-06-25 DIAGNOSIS — I10 Essential (primary) hypertension: Secondary | ICD-10-CM | POA: Diagnosis not present

## 2021-06-25 DIAGNOSIS — E785 Hyperlipidemia, unspecified: Secondary | ICD-10-CM

## 2021-06-25 LAB — POCT URINALYSIS DIP (CLINITEK)
Bilirubin, UA: NEGATIVE
Blood, UA: NEGATIVE
Glucose, UA: NEGATIVE mg/dL
Ketones, POC UA: NEGATIVE mg/dL
Leukocytes, UA: NEGATIVE
Nitrite, UA: NEGATIVE
POC PROTEIN,UA: NEGATIVE
Spec Grav, UA: >=1.03 — AB (ref 1.010–1.025)
Urobilinogen, UA: 0.2 E.U./dL
pH, UA: 6.5 (ref 5.0–8.0)

## 2021-06-25 LAB — POCT GLYCOSYLATED HEMOGLOBIN (HGB A1C)
HbA1c POC (<> result, manual entry): 6.3 % (ref 4.0–5.6)
HbA1c, POC (controlled diabetic range): 6.3 % (ref 0.0–7.0)
HbA1c, POC (prediabetic range): 6.3 % (ref 5.7–6.4)
Hemoglobin A1C: 6.3 % — AB (ref 4.0–5.6)

## 2021-06-25 NOTE — Progress Notes (Signed)
[facility] Patient [facility] [address] [place], [place]  [address] Phone:  [phone]   Fax:  [phone]   Established Patient Office Visit  Subjective:  Patient ID: Christie Christie, female    DOB: [birth year]  Age: 51 y.o. MRN: [id number]  CC:  Chief Complaint  Patient presents with   Follow-up    Pt is here for 3 month follow up visit     HPI Christie Christie presents for follow up. She  has a past medical history of Acid reflux, Bipolar 1 disorder (HCC), and Essential hypertension (06/14/2016).   Christie Christie is in today for diabetes follow up. The prescribed treatment is metformin. No statin therapy per patient. The reported use of treatment is  consistent with prescribed. There no reported side effects from the treatment. Home glucose monitoring is not being done. Occasional headache. Denies fever, chills, dizziness, visual changes, polydipsia,  polyphagia shortness of breath, dyspnea on exertion, chest pain, abdominal pain, nausea, vomiting, polyuria, constipation, diarrhea,  any edema, numbness, tingling, burning of hand or feet.   Past Medical History:  Diagnosis Date   Acid reflux    Bipolar 1 disorder (HCC)    Essential hypertension 06/14/2016    History reviewed. No pertinent surgical history.  Family History  Problem Relation Age of Onset   Mental illness Other     Social History   Socioeconomic History   Marital status: Legally Separated    Spouse name: Not on file   Number of children: Not on file   Years of education: Not on file   Highest education level: Not on file  Occupational History   Not on file  Tobacco Use   Smoking status: Never   Smokeless tobacco: Never  Vaping Use   Vaping Use: Never used  Substance and Sexual Activity   Alcohol use: No   Drug use: No   Sexual activity: Not Currently    Birth control/protection: None  Other Topics Concern   Not on file  Social History Narrative   Not on file   Social  Determinants of Health   Financial Resource Strain: Not on file  Food Insecurity: Not on file  Transportation Needs: Not on file  Physical Activity: Not on file  Stress: Not on file  Social Connections: Not on file  Intimate Partner Violence: Not on file    Outpatient Medications Prior to Visit  Medication Sig Dispense Refill   acetaminophen (TYLENOL) 325 MG tablet Take 650 mg by mouth every 6 (six) hours as needed for mild pain or headache.     fluticasone (FLONASE) 50 MCG/ACT nasal spray Place 2 sprays into both nostrils daily. 9.9 g 0   furosemide (LASIX) 20 MG tablet Take 1 tablet (20 mg total) by mouth daily. TAKE 1 TABLET(20 MG) BY MOUTH DAILY 30 tablet 11   gabapentin (NEURONTIN) 100 MG capsule Take one tablet twice daily 60 capsule 3   IRON PO Take 1 tablet by mouth daily.     Lurasidone HCl (LATUDA) 60 MG TABS TAKE 1 TABLET(60 MG) BY MOUTH DAILY 90 tablet 3   melatonin 5 MG TABS Take 1 tablet (5 mg total) by mouth at bedtime as needed. 60 tablet 3   metFORMIN (GLUCOPHAGE) 500 MG tablet Take 1 tablet (500 mg total) by mouth 2 (two) times daily with a meal. 180 tablet 3   naproxen (NAPROSYN) 500 MG tablet TAKE 1 TABLET(500 MG) BY MOUTH TWICE DAILY WITH A MEAL 30 tablet  5   nystatin-triamcinolone ointment (MYCOLOG) Apply 1 application topically 2 (two) times daily. 30 g 0   COVID-19 mRNA bivalent vaccine, Moderna, (MODERNA COVID-19 BIVAL BOOSTER) 50 MCG/0.5ML injection Inject into the muscle. (Patient not taking: Reported on 03/26/2021) 0.5 mL 0   influenza vac split quadrivalent PF (FLUARIX) 0.5 ML injection Inject into the muscle. (Patient not taking: Reported on 03/26/2021) 0.5 mL 0   HYDROcodone-acetaminophen (HYCET) 7.5-325 mg/15 ml solution Take 10 mLs by mouth every 6 (six) hours as needed for moderate pain or severe pain. (Patient not taking: Reported on 12/24/2020) 120 mL 0   nystatin (MYCOSTATIN/NYSTOP) powder Apply 1 application topically 3 (three) times daily. (Patient not  taking: Reported on 03/26/2021) 15 g 0   No facility-administered medications prior to visit.    Allergies  Allergen Reactions   Bee Venom Swelling    Swelling at site    Amoxicillin Hives and Other (See Comments)    Patient states that she can take this she just has a sensitivity when its taken along with pain medication   Hydrocodone Hives and Other (See Comments)    Sensitivity when taken along with antibiotics per patient   Insect Extract Allergy Skin Test Other (See Comments)    Spiders - make tongue burn   Propoxyphene N-Acetaminophen Nausea And Vomiting and Rash    ** Darvacet    ROS Review of Systems    Objective:    Physical Exam Constitutional:      Appearance: She is obese.  HENT:     Head: Normocephalic and atraumatic.     Nose: Nose normal.     Mouth/Throat:     Mouth: Mucous membranes are moist.  Cardiovascular:     Rate and Rhythm: Normal rate and regular rhythm.     Pulses: Normal pulses.     Heart sounds: Normal heart sounds.  Pulmonary:     Effort: Pulmonary effort is normal.     Breath sounds: Normal breath sounds.  Musculoskeletal:        General: Normal range of motion.     Cervical back: Normal range of motion.  Skin:    General: Skin is warm and dry.     Capillary Refill: Capillary refill takes less than 2 seconds.  Neurological:     General: No focal deficit present.     Mental Status: She is alert and oriented to person, place, and time.  Psychiatric:        Mood and Affect: Mood normal.        Behavior: Behavior normal.        Thought Content: Thought content normal.        Judgment: Judgment normal.    BP 136/85    Pulse 88    Temp (!) 97.4 F (36.3 C)    Ht $R'5\' 5"'Pl$  (1.651 m)    Wt (!) 347 lb 0.2 oz (157.4 kg)    SpO2 98%    BMI 57.75 kg/m  Wt Readings from Last 3 Encounters:  06/25/21 (!) 347 lb 0.2 oz (157.4 kg)  03/26/21 (!) 331 lb 0.6 oz (150.2 kg)  12/24/20 (!) 322 lb 0.8 oz (146.1 kg)     Health Maintenance Due  Topic  Date Due   OPHTHALMOLOGY EXAM  Never done   Hepatitis C Screening  Never done   PAP SMEAR-Modifier  09/16/2019   URINE MICROALBUMIN  01/09/2021   COVID-19 Vaccine (1) 02/23/2021    There are no preventive care reminders to display  for this patient.  Lab Results  Component Value Date   TSH 1.080 10/04/2019   Lab Results  Component Value Date   WBC 4.8 01/10/2020   HGB 13.5 01/10/2020   HCT 42.7 01/10/2020   MCV 91 01/10/2020   PLT 218 01/10/2020   Lab Results  Component Value Date   NA 141 12/24/2020   K 4.2 12/24/2020   CO2 26 08/13/2019   GLUCOSE 94 12/24/2020   BUN 11 12/24/2020   CREATININE 0.87 12/24/2020   BILITOT <0.2 12/24/2020   ALKPHOS 102 12/24/2020   AST 18 12/24/2020   ALT 48 (H) 08/05/2019   PROT 6.5 12/24/2020   ALBUMIN 4.0 12/24/2020   CALCIUM 9.4 12/24/2020   ANIONGAP 10 08/13/2019   EGFR 82 12/24/2020   Lab Results  Component Value Date   CHOL 211 (H) 12/24/2020   Lab Results  Component Value Date   HDL 47 12/24/2020   Lab Results  Component Value Date   LDLCALC 132 (H) 12/24/2020   Lab Results  Component Value Date   TRIG 180 (H) 12/24/2020   Lab Results  Component Value Date   CHOLHDL 4.5 (H) 12/24/2020   Lab Results  Component Value Date   HGBA1C 6.3 (A) 06/25/2021   HGBA1C 6.3 06/25/2021   HGBA1C 6.3 06/25/2021   HGBA1C 6.3 06/25/2021      Assessment & Plan:   Problem List Items Addressed This Visit       Endocrine   Type 2 diabetes mellitus without complication, without long-term current use of insulin (HCC) - Primary Stable    Relevant Orders   POCT glycosylated hemoglobin (Hb A1C) (Completed)   POCT URINALYSIS DIP (CLINITEK)   Microalbumin/Creatinine Ratio, Urine   Comp. Metabolic Panel (12)     Other   OBESITY Obesity with BMI and comorbidities as noted above.  Discussed proper diet (low fat, low sodium, high fiber) with patient.   Discussed need for regular exercise (3 times per week, 20 minutes per  session) with patient.    Relevant Orders   TSH   Bipolar 2 disorder ([place]) Stable  Enourage patient to keep all follow-up visits with your psychiatry  Take your medicines as directed    Other Visit Diagnoses     Essential hypertension     Stable  Encouraged on going compliance with current medication regimen Encouraged home monitoring and recording BP <130/80 Eating a heart-healthy diet with less salt Encouraged regular physical activity  Recommend Weight loss     Dyslipidemia with high LDL and low HDL     Reevaluation pending Encourage diet adherence    Relevant Orders   Lipid panel       No orders of the defined types were placed in this encounter.   Follow-up: Return in about 3 months (around 09/22/2021).    Vevelyn Francois, NP

## 2021-06-25 NOTE — Patient Instructions (Addendum)
Managing Your Hypertension °Hypertension, also called high blood pressure, is when the force of the blood pressing against the walls of the arteries is too strong. Arteries are blood vessels that carry blood from your heart throughout your body. Hypertension forces the heart to work harder to pump blood and may cause the arteries to become narrow or stiff. °Understanding blood pressure readings °Your personal target blood pressure may vary depending on your medical conditions, your age, and other factors. A blood pressure reading includes a higher number over a lower number. Ideally, your blood pressure should be below 120/80. You should know that: °The first, or top, number is called the systolic pressure. It is a measure of the pressure in your arteries as your heart beats. °The second, or bottom number, is called the diastolic pressure. It is a measure of the pressure in your arteries as the heart relaxes. °Blood pressure is classified into four stages. Based on your blood pressure reading, your health care provider may use the following stages to determine what type of treatment you need, if any. Systolic pressure and diastolic pressure are measured in a unit called mmHg. °Normal °Systolic pressure: below 120. °Diastolic pressure: below 80. °Elevated °Systolic pressure: 120-129. °Diastolic pressure: below 80. °Hypertension stage 1 °Systolic pressure: 130-139. °Diastolic pressure: 80-89. °Hypertension stage 2 °Systolic pressure: 140 or above. °Diastolic pressure: 90 or above. °How can this condition affect me? °Managing your hypertension is an important responsibility. Over time, hypertension can damage the arteries and decrease blood flow to important parts of the body, including the brain, heart, and kidneys. Having untreated or uncontrolled hypertension can lead to: °A heart attack. °A stroke. °A weakened blood vessel (aneurysm). °Heart failure. °Kidney damage. °Eye damage. °Metabolic syndrome. °Memory and  concentration problems. °Vascular dementia. °What actions can I take to manage this condition? °Hypertension can be managed by making lifestyle changes and possibly by taking medicines. Your health care provider will help you make a plan to bring your blood pressure within a normal range. °Nutrition ° °Eat a diet that is high in fiber and potassium, and low in salt (sodium), added sugar, and fat. An example eating plan is called the Dietary Approaches to Stop Hypertension (DASH) diet. To eat this way: °Eat plenty of fresh fruits and vegetables. Try to fill one-half of your plate at each meal with fruits and vegetables. °Eat whole grains, such as whole-wheat pasta, brown rice, or whole-grain bread. Fill about one-fourth of your plate with whole grains. °Eat low-fat dairy products. °Avoid fatty cuts of meat, processed or cured meats, and poultry with skin. Fill about one-fourth of your plate with lean proteins such as fish, chicken without skin, beans, eggs, and tofu. °Avoid pre-made and processed foods. These tend to be higher in sodium, added sugar, and fat. °Reduce your daily sodium intake. Most people with hypertension should eat less than 1,500 mg of sodium a day. °Lifestyle ° °Work with your health care provider to maintain a healthy body weight or to lose weight. Ask what an ideal weight is for you. °Get at least 30 minutes of exercise that causes your heart to beat faster (aerobic exercise) most days of the week. Activities may include walking, swimming, or biking. °Include exercise to strengthen your muscles (resistance exercise), such as weight lifting, as part of your weekly exercise routine. Try to do these types of exercises for 30 minutes at least 3 days a week. °Do not use any products that contain nicotine or tobacco, such as cigarettes, e-cigarettes,   and chewing tobacco. If you need help quitting, ask your health care provider. °Control any long-term (chronic) conditions you have, such as high  cholesterol or diabetes. °Identify your sources of stress and find ways to manage stress. This may include meditation, deep breathing, or making time for fun activities. °Alcohol use °Do not drink alcohol if: °Your health care provider tells you not to drink. °You are pregnant, may be pregnant, or are planning to become pregnant. °If you drink alcohol: °Limit how much you use to: °0-1 drink a day for women. °0-2 drinks a day for men. °Be aware of how much alcohol is in your drink. In the [place] one drink equals one 12 oz bottle of beer (355 mL), one 5 oz glass of wine (148 mL), or one 1½ oz glass of hard liquor (44 mL). °Medicines °Your health care provider may prescribe medicine if lifestyle changes are not enough to get your blood pressure under control and if: °Your systolic blood pressure is 130 or higher. °Your diastolic blood pressure is 80 or higher. °Take medicines only as told by your health care provider. Follow the directions carefully. Blood pressure medicines must be taken as told by your health care provider. The medicine does not work as well when you skip doses. Skipping doses also puts you at risk for problems. °Monitoring °Before you monitor your blood pressure: °Do not smoke, drink caffeinated beverages, or exercise within 30 minutes before taking a measurement. °Use the bathroom and empty your bladder (urinate). °Sit quietly for at least 5 minutes before taking measurements. °Monitor your blood pressure at home as told by your health care provider. To do this: °Sit with your back straight and supported. °Place your feet flat on the floor. Do not cross your legs. °Support your arm on a flat surface, such as a table. Make sure your upper arm is at heart level. °Each time you measure, take two or three readings one minute apart and record the results. °You may also need to have your blood pressure checked regularly by your health care provider. °General information °Talk with your health care  provider about your diet, exercise habits, and other lifestyle factors that may be contributing to hypertension. °Review all the medicines you take with your health care provider because there may be side effects or interactions. °Keep all visits as told by your health care provider. Your health care provider can help you create and adjust your plan for managing your high blood pressure. °Where to find more information °National Heart, Lung, and Blood Institute: www.nhlbi.nih.gov °American Heart Association: www.heart.org °Contact a health care provider if: °You think you are having a reaction to medicines you have taken. °You have repeated (recurrent) headaches. °You feel dizzy. °You have swelling in your ankles. °You have trouble with your vision. °Get help right away if: °You develop a severe headache or confusion. °You have unusual weakness or numbness, or you feel faint. °You have severe pain in your chest or abdomen. °You vomit repeatedly. °You have trouble breathing. °These symptoms may represent a serious problem that is an emergency. Do not wait to see if the symptoms will go away. Get medical help right away. Call your local emergency services (911 in the [place] Do not drive yourself to the hospital. °Summary °Hypertension is when the force of blood pumping through your arteries is too strong. If this condition is not controlled, it may put you at risk for serious complications. °Your personal target blood pressure may vary depending on   your medical conditions, your age, and other factors. For most people, a normal blood pressure is less than 120/80. °Hypertension is managed by lifestyle changes, medicines, or both. °Lifestyle changes to help manage hypertension include losing weight, eating a healthy, low-sodium diet, exercising more, stopping smoking, and limiting alcohol. °This information is not intended to replace advice given to you by your health care provider. Make sure you discuss any questions  you have with your health care provider. °Document Revised: 05/13/2019 Document Reviewed: 03/26/2019 °Elsevier Patient Education © 2022 Elsevier Inc. ° °Diabetes Mellitus and Nutrition, Adult °When you have diabetes, or diabetes mellitus, it is very important to have healthy eating habits because your blood sugar (glucose) levels are greatly affected by what you eat and drink. Eating healthy foods in the right amounts, at about the same times every day, can help you: °Manage your blood glucose. °Lower your risk of heart disease. °Improve your blood pressure. °Reach or maintain a healthy weight. °What can affect my meal plan? °Every person with diabetes is different, and each person has different needs for a meal plan. Your health care provider may recommend that you work with a dietitian to make a meal plan that is best for you. Your meal plan may vary depending on factors such as: °The calories you need. °The medicines you take. °Your weight. °Your blood glucose, blood pressure, and cholesterol levels. °Your activity level. °Other health conditions you have, such as heart or kidney disease. °How do carbohydrates affect me? °Carbohydrates, also called carbs, affect your blood glucose level more than any other type of food. Eating carbs raises the amount of glucose in your blood. °It is important to know how many carbs you can safely have in each meal. This is different for every person. Your dietitian can help you calculate how many carbs you should have at each meal and for each snack. °How does alcohol affect me? °Alcohol can cause a decrease in blood glucose (hypoglycemia), especially if you use insulin or take certain diabetes medicines by mouth. Hypoglycemia can be a life-threatening condition. Symptoms of hypoglycemia, such as sleepiness, dizziness, and confusion, are similar to symptoms of having too much alcohol. °Do not drink alcohol if: °Your health care provider tells you not to drink. °You are pregnant,  may be pregnant, or are planning to become pregnant. °If you drink alcohol: °Limit how much you have to: °0-1 drink a day for women. °0-2 drinks a day for men. °Know how much alcohol is in your drink. In the [place] one drink equals one 12 oz bottle of beer (355 mL), one 5 oz glass of wine (148 mL), or one 1½ oz glass of hard liquor (44 mL). °Keep yourself hydrated with water, diet soda, or unsweetened iced tea. Keep in mind that regular soda, juice, and other mixers may contain a lot of sugar and must be counted as carbs. °What are tips for following this plan? °Reading food labels °Start by checking the serving size on the Nutrition Facts label of packaged foods and drinks. The number of calories and the amount of carbs, fats, and other nutrients listed on the label are based on one serving of the item. Many items contain more than one serving per package. °Check the total grams (g) of carbs in one serving. °Check the number of grams of saturated fats and trans fats in one serving. Choose foods that have a low amount or none of these fats. °Check the number of milligrams (mg) of salt (sodium)   in one serving. Most people should limit total sodium intake to less than 2,300 mg per day. °Always check the nutrition information of foods labeled as "low-fat" or "nonfat." These foods may be higher in added sugar or refined carbs and should be avoided. °Talk to your dietitian to identify your daily goals for nutrients listed on the label. °Shopping °Avoid buying canned, pre-made, or processed foods. These foods tend to be high in fat, sodium, and added sugar. °Shop around the outside edge of the grocery store. This is where you will most often find fresh fruits and vegetables, bulk grains, fresh meats, and fresh dairy products. °Cooking °Use low-heat cooking methods, such as baking, instead of high-heat cooking methods, such as deep frying. °Cook using healthy oils, such as olive, canola, or sunflower oil. °Avoid cooking  with butter, cream, or high-fat meats. °Meal planning °Eat meals and snacks regularly, preferably at the same times every day. Avoid going long periods of time without eating. °Eat foods that are high in fiber, such as fresh fruits, vegetables, beans, and whole grains. °Eat 4-6 oz (112-168 g) of lean protein each day, such as lean meat, chicken, fish, eggs, or tofu. One ounce (oz) (28 g) of lean protein is equal to: °1 oz (28 g) of meat, chicken, or fish. °1 egg. °¼ cup (62 g) of tofu. °Eat some foods each day that contain healthy fats, such as avocado, nuts, seeds, and fish. °What foods should I eat? °Fruits °Berries. Apples. Oranges. Peaches. Apricots. Plums. Grapes. Mangoes. Papayas. Pomegranates. Kiwi. Cherries. °Vegetables °Leafy greens, including lettuce, spinach, kale, chard, collard greens, mustard greens, and cabbage. Beets. Cauliflower. Broccoli. Carrots. Green beans. Tomatoes. Peppers. Onions. Cucumbers. Brussels sprouts. °Grains °Whole grains, such as whole-wheat or whole-grain bread, crackers, tortillas, cereal, and pasta. Unsweetened oatmeal. Quinoa. Brown or wild rice. °Meats and other proteins °Seafood. Poultry without skin. Lean cuts of poultry and beef. Tofu. Nuts. Seeds. °Dairy °Low-fat or fat-free dairy products such as milk, yogurt, and cheese. °The items listed above may not be a complete list of foods and beverages you can eat and drink. Contact a dietitian for more information. °What foods should I avoid? °Fruits °Fruits canned with syrup. °Vegetables °Canned vegetables. Frozen vegetables with butter or cream sauce. °Grains °Refined white flour and flour products such as bread, pasta, snack foods, and cereals. Avoid all processed foods. °Meats and other proteins °Fatty cuts of meat. Poultry with skin. Breaded or fried meats. Processed meat. Avoid saturated fats. °Dairy °Full-fat yogurt, cheese, or milk. °Beverages °Sweetened drinks, such as soda or iced tea. °The items listed above may not be  a complete list of foods and beverages you should avoid. Contact a dietitian for more information. °Questions to ask a health care provider °Do I need to meet with a certified diabetes care and education specialist? °Do I need to meet with a dietitian? °What number can I call if I have questions? °When are the best times to check my blood glucose? °Where to find more information: °American Diabetes Association: diabetes.org °Academy of Nutrition and Dietetics: eatright.org °National Institute of Diabetes and Digestive and Kidney Diseases: niddk.nih.gov °Association of Diabetes Care & Education Specialists: diabeteseducator.org °Summary °It is important to have healthy eating habits because your blood sugar (glucose) levels are greatly affected by what you eat and drink. It is important to use alcohol carefully. °A healthy meal plan will help you manage your blood glucose and lower your risk of heart disease. °Your health care provider may recommend that you   work with a dietitian to make a meal plan that is best for you. °This information is not intended to replace advice given to you by your health care provider. Make sure you discuss any questions you have with your health care provider. °Document Revised: 11/27/2019 Document Reviewed: 11/27/2019 °Elsevier Patient Education © 2022 Elsevier Inc. ° °

## 2021-07-22 ENCOUNTER — Ambulatory Visit (INDEPENDENT_AMBULATORY_CARE_PROVIDER_SITE_OTHER): Payer: No Payment, Other | Admitting: Nurse Practitioner

## 2021-07-22 ENCOUNTER — Other Ambulatory Visit: Payer: Self-pay

## 2021-07-22 VITALS — BP 116/73 | HR 86 | Temp 97.9°F | Ht 65.0 in | Wt 345.4 lb

## 2021-07-22 DIAGNOSIS — M25531 Pain in right wrist: Secondary | ICD-10-CM

## 2021-07-22 MED ORDER — METHYLPREDNISOLONE 4 MG PO TBPK: ORAL_TABLET | ORAL | 0 refills | Status: AC

## 2021-07-22 MED ORDER — KETOROLAC TROMETHAMINE 60 MG/2ML IM SOLN
60.0000 mg | Freq: Once | INTRAMUSCULAR | Status: AC
  Administered 2021-07-22: 60 mg via INTRAMUSCULAR

## 2021-07-22 NOTE — Patient Instructions (Signed)
Wrist and Forearm Exercises ?Ask your health care provider which exercises are safe for you. Do exercises exactly as told by your health care provider and adjust them as directed. It is normal to feel mild stretching, pulling, tightness, or discomfort as you do these exercises. Stop right away if you feel sudden pain or your pain gets worse. Do not begin these exercises until told by your health care provider. ?Range-of-motion exercises ?These exercises warm up your muscles and joints and improve the movement and flexibility of your injured wrist and forearm. These exercises also help to relieve pain, numbness, and tingling. These exercises are done using the muscles in your injured wrist and forearm. ?Wrist flexion ?Bend your left / right elbow to a 90-degree angle (right angle) with your palm facing the floor. ?Bend your wrist so that your fingers point toward the floor (flexion). ?Hold this position for __________ seconds. ?Slowly return to the starting position. ?Repeat __________ times. Complete this exercise __________ times a day. ?Wrist extension ?Bend your left / right elbow to a 90-degree angle (right angle) with your palm facing the floor. ?Bend your wrist so that your fingers point toward the ceiling (extension). ?Hold this position for __________ seconds. ?Slowly return to the starting position. ?Repeat __________ times. Complete this exercise __________ times a day. ?Ulnar deviation ?Bend your left / right elbow to a 90-degree angle (right angle), and rest your forearm on a table with your palm facing down. ?Keeping your hand flat on the table, bend your left /right wrist toward your small finger (pinkie). This is ulnar deviation. ?Hold this position for __________ seconds. ?Slowly return to the starting position. ?Repeat __________ times. Complete this exercise __________ times a day. ?Radial deviation ?Bend your left / right elbow to a 90-degree angle (right angle), and rest your forearm on a table  with your palm facing down. ?Keeping your hand flat on the table, bend your left /right wrist toward your thumb. This is radial deviation. ?Hold this position for __________ seconds. ?Slowly return to the starting position. ?Repeat __________ times. Complete this exercise __________ times a day. ?Forearm rotation, supination ? ?Sit with your left / right elbow bent to a 90-degree angle (right angle). Position your forearm so that the thumb is facing the ceiling (neutral position). ?Turn (rotate) your palm up toward the ceiling (supination), stopping when you feel a gentle stretch. ?Hold this position for __________ seconds. ?Slowly return to the starting position. ?Repeat __________ times. Complete this exercise __________ times a day. ?Forearm rotation, pronation ? ?Sit with your left / right elbow bent to a 90-degree angle (right angle). Position your forearm so that the thumb is facing the ceiling (neutral position). ?Rotate your palm down toward the floor (pronation), stopping when you feel a gentle stretch. ?Hold this position for __________ seconds. ?Slowly return to the starting position. ?Repeat __________ times. Complete this exercise __________ times a day. ?Stretching ?These exercises warm up your muscles and joints and improve the movement and flexibility of your injured wrist and forearm. These exercises also help to relieve pain, numbness, and tingling. These exercises are done using your healthy wrist and forearm to help stretch the muscles in your injured wrist and forearm. ?Wrist flexion ? ?Extend your left / right arm in front of you, and turn your palm down toward the floor. ?If told by your health care provider, bend your left / right elbow to a 90-degree angle (right angle) at your side. ?Using your uninjured hand, gently press over  the back of your left / right hand to bend your wrist and fingers toward the floor (flexion). Go as far as you can to feel a stretch without causing pain. ?Hold this  position for __________ seconds. ?Slowly return to the starting position. ?Repeat __________ times. Complete this exercise __________ times a day. ?Wrist extension ? ?Extend your left / right arm in front of you and turn your palm up toward the ceiling. ?If told by your health care provider, bend your left / right elbow to a 90-degree angle (right angle) at your side. ?Using your uninjured hand, gently press over the palm of your left / right hand to bend your wrist and fingers toward the floor (extension). Go as far as you can to feel a stretch without causing pain. ?Hold this position for __________ seconds. ?Slowly return to the starting position. ?Repeat __________ times. Complete this exercise __________ times a day. ?Forearm rotation, supination ?Sit with your left / right elbow bent to a 90-degree angle (right angle). Position your forearm so that the thumb is facing the ceiling (neutral position). ?Rotate your palm up toward the ceiling as far as you can on your own (supination). Then, use your uninjured hand to help turn your forearm more, stopping when you feel a gentle stretch. ?Hold this position for __________ seconds. ?Slowly return to the starting position. ?Repeat __________ times. Complete this exercise __________ times a day. ?Forearm rotation, pronation ?Sit with your left / right elbow bent to a 90-degree angle (right angle). Position your forearm so that the thumb is facing the ceiling (neutral position). ?Rotate your palm down toward the floor as far as you can on your own (pronation). Then, use your uninjured hand to help turn your forearm more, stopping when you feel a gentle stretch. ?Hold this position for __________ seconds. ?Slowly return to the starting position. ?Repeat __________ times. Complete this exercise __________ times a day. ?Strengthening exercises ?These exercises build strength and endurance in your wrist and forearm. Endurance is the ability to use your muscles for a long  time, even after they get tired. ?Wrist flexion ? ?Sit with your left / right forearm supported on a table or other surface. Bend your elbow to a 90-degree angle (right angle), and rest your hand palm-up over the edge of the table. ?Hold a __________ weight in your left / right hand. Or, hold an exercise band or tube in both hands, keeping your hands at the same level and hip distance apart. There should be a slight tension in the exercise band or tube. ?Slowly curl your hand up toward the ceiling (flexion). ?Hold this position for __________ seconds. ?Slowly lower your hand back to the starting position. ?Repeat __________ times. Complete this exercise __________ times a day. ?Wrist extension ? ?Sit with your left / right forearm supported on a table or other surface. Bend your elbow to a 90-degree angle (right angle), and rest your hand palm-down over the edge of the table. ?Hold a __________ weight in your left / right hand. Or, hold an exercise band or tube in both hands, keeping your hands at the same level and hip distance apart. There should be a slight tension in the exercise band or tube. ?Slowly curl your hand up toward the ceiling (extension). ?Hold this position for __________ seconds. ?Slowly lower your hand back to the starting position. ?Repeat __________ times. Complete this exercise __________ times a day. ?Forearm rotation, supination ? ?Sit with your left / right forearm supported  on a table or other surface. Bend your elbow to a 90-degree angle (right angle). Position your forearm so that your thumb is facing the ceiling (neutral position) and your hand is resting over the edge of the table. ?Hold a hammer in your left / right hand. ?This exercise will be easier if you hold the hammer near the head of the hammer. ?This exercise will be harder if you hold the hammer near the end of the handle. ?Without moving your elbow, slowly rotate your palm up toward the ceiling (supination). ?Hold this  position for __________ seconds. ?Slowly return to the starting position. ?Repeat __________ times. Complete this exercise __________ times a day. ?Forearm rotation, pronation ? ?Sit with your left / right f

## 2021-07-22 NOTE — Progress Notes (Signed)
? ?[facility] ?[place], [place]  [address] ?Phone:  [phone]   Fax:  [phone] ? ? ?Established Patient Office Visit ? ?Subjective:  ?Patient ID: Christie Walker, female    DOB: 11-Jul-1970  Age: 51 y.o. MRN: [id number] ? ?CC:  ?Chief Complaint  ?Patient presents with  ? Wrist Pain  ?  Patient is here today to discuss her right wrist pain she has been having x 1 month. Patient states she doesn't recall injuring it.  ?Patient symptoms- stiffness everyday, limited range of motion, radiating pains off and on in right arm. No previous imaging has been done  ? ? ?HPI ?Christie Walker presents for wrist pain. She  has a past medical history of Acid reflux, Bipolar 1 disorder ([place]), and Essential hypertension (06/14/2016).  ? ?Wrist Pain ?Patient complains of right wrist pain.  The pain is severe, worsens with morning, and some relief by shake out.  He feels like is stops the stiffness. She does have pain in her thumb There is not associated numbness, tingling in the right hand.  Pain has been present for a while now just getting worse.  .  There is not a history of injury, strain, overuse. She writes a lot at work,. She does have to  assemble things at home.  ?Pain is worse is in the morning and iith activity. She denies any burning, numbness or tingling.  ?She has tired APAP and BC to no avail. She feels like she is trying to but a seal on the truck. A bolt seal. She has some limited ROM.  ? ?She is a one time Chief Strategy Officer. ?Past Medical History:  ?Diagnosis Date  ? Acid reflux   ? Bipolar 1 disorder ([place])   ? Essential hypertension 06/14/2016  ? ? ?No past surgical history on file. ? ?Family History  ?Problem Relation Age of Onset  ? Mental illness Other   ? ? ?Social History  ? ?Socioeconomic History  ? Marital status: Legally Separated  ?  Spouse name: Not on file  ? Number of children: Not on file  ? Years of education: Not on file  ? Highest education level: Not on file   ?Occupational History  ? Not on file  ?Tobacco Use  ? Smoking status: Never  ? Smokeless tobacco: Never  ?Vaping Use  ? Vaping Use: Never used  ?Substance and Sexual Activity  ? Alcohol use: No  ? Drug use: No  ? Sexual activity: Not Currently  ?  Birth control/protection: None  ?Other Topics Concern  ? Not on file  ?Social History Narrative  ? Not on file  ? ?Social Determinants of Health  ? ?Financial Resource Strain: Not on file  ?Food Insecurity: Not on file  ?Transportation Needs: Not on file  ?Physical Activity: Not on file  ?Stress: Not on file  ?Social Connections: Not on file  ?Intimate Partner Violence: Not on file  ? ? ?Outpatient Medications Prior to Visit  ?Medication Sig Dispense Refill  ? acetaminophen (TYLENOL) 325 MG tablet Take 650 mg by mouth every 6 (six) hours as needed for mild pain or headache.    ? fluticasone (FLONASE) 50 MCG/ACT nasal spray Place 2 sprays into both nostrils daily. 9.9 g 0  ? furosemide (LASIX) 20 MG tablet Take 1 tablet (20 mg total) by mouth daily. TAKE 1 TABLET(20 MG) BY MOUTH DAILY 30 tablet 11  ? gabapentin (NEURONTIN) 100 MG capsule Take one tablet twice daily  60 capsule 3  ? IRON PO Take 1 tablet by mouth daily.    ? Lurasidone HCl (LATUDA) 60 MG TABS TAKE 1 TABLET(60 MG) BY MOUTH DAILY 90 tablet 3  ? melatonin 5 MG TABS Take 1 tablet (5 mg total) by mouth at bedtime as needed. 60 tablet 3  ? metFORMIN (GLUCOPHAGE) 500 MG tablet Take 1 tablet (500 mg total) by mouth 2 (two) times daily with a meal. 180 tablet 3  ? naproxen (NAPROSYN) 500 MG tablet TAKE 1 TABLET(500 MG) BY MOUTH TWICE DAILY WITH A MEAL 30 tablet 5  ? nystatin-triamcinolone ointment (MYCOLOG) Apply 1 application topically 2 (two) times daily. 30 g 0  ? COVID-19 mRNA bivalent vaccine, Moderna, (MODERNA COVID-19 BIVAL BOOSTER) 50 MCG/0.5ML injection Inject into the muscle. (Patient not taking: Reported on 03/26/2021) 0.5 mL 0  ? influenza vac split quadrivalent PF (FLUARIX) 0.5 ML injection Inject into  the muscle. (Patient not taking: Reported on 03/26/2021) 0.5 mL 0  ? ?No facility-administered medications prior to visit.  ? ? ?Allergies  ?Allergen Reactions  ? Bee Venom Swelling  ?  Swelling at site   ? Amoxicillin Hives and Other (See Comments)  ?  Patient states that she can take this she just has a sensitivity when its taken along with pain medication  ? Hydrocodone Hives and Other (See Comments)  ?  Sensitivity when taken along with antibiotics like amoxicillin per patient  ? Insect Extract Allergy Skin Test Other (See Comments)  ?  Spiders - make tongue burn  ? Propoxyphene N-Acetaminophen Nausea And Vomiting and Rash  ?  ** Darvacet  ? ? ?ROS ?Review of Systems ? ?  ?Objective:  ?  ?Physical Exam ?Constitutional:   ?   Appearance: She is obese.  ?HENT:  ?   Head: Normocephalic and atraumatic.  ?Musculoskeletal:  ?   Right wrist: Effusion, tenderness and snuff box tenderness present. Decreased range of motion.  ?   Left wrist: Normal.  ?   Right hand: There is disruption of two-point discrimination.  ?   Left hand: Normal.  ?Skin: ?   General: Skin is warm and dry.  ?   Capillary Refill: Capillary refill takes less than 2 seconds.  ?Neurological:  ?   General: No focal deficit present.  ?   Mental Status: She is alert and oriented to person, place, and time.  ?Psychiatric:     ?   Mood and Affect: Mood normal.     ?   Behavior: Behavior normal.     ?   Thought Content: Thought content normal.     ?   Judgment: Judgment normal.  ? ? ?BP 116/73   Pulse 86   Temp 97.9 ?F (36.6 ?C)   Ht _0  (1.651 m)   Wt (!) 345 lb 6.4 oz (156.7 kg)   SpO2 96%   BMI 57.48 kg/m?  ?Wt Readings from Last 3 Encounters:  ?07/22/21 (!) 345 lb 6.4 oz (156.7 kg)  ?06/25/21 (!) 347 lb 0.2 oz (157.4 kg)  ?03/26/21 (!) 331 lb 0.6 oz (150.2 kg)  ? ? ? ?Health Maintenance Due  ?Topic Date Due  ? OPHTHALMOLOGY EXAM  Never done  ? Hepatitis C Screening  Never done  ? PAP SMEAR-Modifier  09/16/2019  ? FOOT EXAM  01/09/2021  ? URINE  MICROALBUMIN  01/09/2021  ? COVID-19 Vaccine (1) 02/23/2021  ? ? ?There are no preventive care reminders to display for this patient. ? ?Lab Results  ?  Component Value Date  ? TSH 1.080 10/04/2019  ? ?Lab Results  ?Component Value Date  ? WBC 4.8 01/10/2020  ? HGB 13.5 01/10/2020  ? HCT 42.7 01/10/2020  ? MCV 91 01/10/2020  ? PLT 218 01/10/2020  ? ?Lab Results  ?Component Value Date  ? NA 141 12/24/2020  ? K 4.2 12/24/2020  ? CO2 26 08/13/2019  ? GLUCOSE 94 12/24/2020  ? BUN 11 12/24/2020  ? CREATININE 0.87 12/24/2020  ? BILITOT <0.2 12/24/2020  ? ALKPHOS 102 12/24/2020  ? AST 18 12/24/2020  ? ALT 48 (H) 08/05/2019  ? PROT 6.5 12/24/2020  ? ALBUMIN 4.0 12/24/2020  ? CALCIUM 9.4 12/24/2020  ? ANIONGAP 10 08/13/2019  ? EGFR 82 12/24/2020  ? ?Lab Results  ?Component Value Date  ? CHOL 211 (H) 12/24/2020  ? ?Lab Results  ?Component Value Date  ? HDL 47 12/24/2020  ? ?Lab Results  ?Component Value Date  ? LDLCALC 132 (H) 12/24/2020  ? ?Lab Results  ?Component Value Date  ? TRIG 180 (H) 12/24/2020  ? ?Lab Results  ?Component Value Date  ? CHOLHDL 4.5 (H) 12/24/2020  ? ?Lab Results  ?Component Value Date  ? HGBA1C 6.3 (A) 06/25/2021  ? HGBA1C 6.3 06/25/2021  ? HGBA1C 6.3 06/25/2021  ? HGBA1C 6.3 06/25/2021  ? ? ?  ?Assessment & Plan:  ? ?Problem List Items Addressed This Visit   ?None ?Visit Diagnoses   ? ? Wrist pain, acute, right    -  Primary ?Exercise and education   ? Relevant Medications  ? methylPREDNISolone (MEDROL) 4 MG TBPK tablet  ? ketorolac (TORADOL) injection 60 mg (Completed)  ? Other Relevant Orders  ? DG Wrist Complete Right  ? ?  ? ? ?Meds ordered this encounter  ?Medications  ? methylPREDNISolone (MEDROL) 4 MG TBPK tablet  ?  Sig: Follow package instructions.  ?  Dispense:  21 tablet  ?  Refill:  0  ?  Do not add to the "Automatic Refill" notification system.  ?  Order Specific Question:   Supervising Provider  ?  AnswerTresa Garter [[id number]]  ? ketorolac (TORADOL) injection 60 mg   ? ? ?Follow-up: No follow-ups on file.  ? ? ?Vevelyn Francois, NP ?

## 2021-07-23 ENCOUNTER — Ambulatory Visit (HOSPITAL_COMMUNITY)
Admission: RE | Admit: 2021-07-23 | Discharge: 2021-07-23 | Disposition: A | Discharge status: Home / Self Care | Payer: Self-pay | Source: Ambulatory Visit | Attending: Nurse Practitioner | Admitting: Nurse Practitioner

## 2021-07-23 DIAGNOSIS — M25531 Pain in right wrist: Secondary | ICD-10-CM | POA: Insufficient documentation

## 2021-07-30 ENCOUNTER — Other Ambulatory Visit: Payer: Self-pay | Admitting: Nurse Practitioner

## 2021-07-30 ENCOUNTER — Telehealth: Payer: Self-pay | Admitting: Nurse Practitioner

## 2021-07-30 MED ORDER — IBUPROFEN 800 MG PO TABS: 800.0000 mg | ORAL_TABLET | Freq: Three times a day (TID) | ORAL | 2 refills | Status: AC | PRN

## 2021-07-30 NOTE — Telephone Encounter (Signed)
Requesting ibuprofen prescription for wrist pain. Steroid prescription ran out, pain came back. ?

## 2021-07-30 NOTE — Telephone Encounter (Signed)
The refill has been sent. Thanks  ° °

## 2021-08-05 ENCOUNTER — Telehealth (INDEPENDENT_AMBULATORY_CARE_PROVIDER_SITE_OTHER): Payer: No Payment, Other | Admitting: Psychiatry

## 2021-08-05 DIAGNOSIS — F3181 Bipolar II disorder: Secondary | ICD-10-CM | POA: Diagnosis not present

## 2021-08-05 MED ORDER — GABAPENTIN 100 MG PO CAPS: ORAL_CAPSULE | ORAL | 2 refills | Status: DC

## 2021-08-05 NOTE — Progress Notes (Signed)
BH MD/PA/NP OP Progress Note ? ?08/05/2021 11:39 AM ?Christie Walker  ?MRN:  [id number] ? ?Virtual Visit via Video Note ? ?I connected with Christie Walker on 08/05/21 at 11:00 AM EDT by a video enabled telemedicine application and verified that I am speaking with the correct person using two identifiers. ? ?Location: ?Patient: home ?Provider: offsite ?  ?I discussed the limitations of evaluation and management by telemedicine and the availability of in person appointments. The patient expressed understanding and agreed to proceed. ? ?  ?I discussed the assessment and treatment plan with the patient. The patient was provided an opportunity to ask questions and all were answered. The patient agreed with the plan and demonstrated an understanding of the instructions. ?  ?The patient was advised to call back or seek an in-person evaluation if the symptoms worsen or if the condition fails to improve as anticipated. ? ?I provided 10 minutes of non-face-to-face time during this encounter. ? ? ?Christie Sober, NP  ? ?Chief Complaint: Medication management ? ?HPI: Christie Walker is a 51 year old female presenting to [facility] behavioral health outpatient for follow-up psychiatric evaluation.  She has a psychiatric history of bipolar disorder and her symptoms are managed with melatonin 5 mg at bedtime as needed for sleep, Latuda 60 mg daily, and gabapentin 100 mg twice daily.  She reports medication compliance and denies the need for a dosage adjustment today stating that her symptoms are effectively managed with her current medication regimen.  She denies any adverse effects.  No medication changes today. ?Patient is alert and oriented x4, calm, pleasant and willing to engage.  She reports good sleep mood and appetite.  Patient denies suicidal or homicidal ideations, paranoia, delusional thought, auditory or visual hallucinations. ? ?Visit Diagnosis:  ?  ICD-10-CM   ?1. Bipolar 2 disorder (HCC)  F31.81  gabapentin (NEURONTIN) 100 MG capsule  ?  ? ? ?Past Psychiatric History: Bipolar disorder ? ?Past Medical History:  ?Past Medical History:  ?Diagnosis Date  ? Acid reflux   ? Bipolar 1 disorder (HCC)   ? Essential hypertension 06/14/2016  ? No past surgical history on file. ? ?Family Psychiatric History: See below ? ?Family History:  ?Family History  ?Problem Relation Age of Onset  ? Mental illness Other   ? ? ?Social History:  ?Social History  ? ?Socioeconomic History  ? Marital status: Legally Separated  ?  Spouse name: Not on file  ? Number of children: Not on file  ? Years of education: Not on file  ? Highest education level: Not on file  ?Occupational History  ? Not on file  ?Tobacco Use  ? Smoking status: Never  ? Smokeless tobacco: Never  ?Vaping Use  ? Vaping Use: Never used  ?Substance and Sexual Activity  ? Alcohol use: No  ? Drug use: No  ? Sexual activity: Not Currently  ?  Birth control/protection: None  ?Other Topics Concern  ? Not on file  ?Social History Narrative  ? Not on file  ? ?Social Determinants of Health  ? ?Financial Resource Strain: Not on file  ?Food Insecurity: Not on file  ?Transportation Needs: Not on file  ?Physical Activity: Not on file  ?Stress: Not on file  ?Social Connections: Not on file  ? ? ?Allergies:  ?Allergies  ?Allergen Reactions  ? Bee Venom Swelling  ?  Swelling at site   ? Amoxicillin Hives and Other (See Comments)  ?  Patient states that she can take this she just  has a sensitivity when its taken along with pain medication  ? Hydrocodone Hives and Other (See Comments)  ?  Sensitivity when taken along with antibiotics like amoxicillin per patient  ? Insect Extract Allergy Skin Test Other (See Comments)  ?  Spiders - make tongue burn  ? Propoxyphene N-Acetaminophen Nausea And Vomiting and Rash  ?  ** Darvacet  ? ? ?Metabolic Disorder Labs: ?Lab Results  ?Component Value Date  ? HGBA1C 6.3 (A) 06/25/2021  ? HGBA1C 6.3 06/25/2021  ? HGBA1C 6.3 06/25/2021  ? HGBA1C 6.3  06/25/2021  ? MPG 139.85 08/06/2019  ? MPG 128.37 08/01/2019  ? ?No results found for: PROLACTIN ?Lab Results  ?Component Value Date  ? CHOL 211 (H) 12/24/2020  ? TRIG 180 (H) 12/24/2020  ? HDL 47 12/24/2020  ? CHOLHDL 4.5 (H) 12/24/2020  ? VLDL 32 (H) 06/14/2016  ? LDLCALC 132 (H) 12/24/2020  ? LDLCALC 159 (H) 08/06/2020  ? ?Lab Results  ?Component Value Date  ? TSH 1.080 10/04/2019  ? TSH 0.262 (L) 08/06/2019  ? ? ?Therapeutic Level Labs: ?No results found for: LITHIUM ?No results found for: VALPROATE ?No components found for:  CBMZ ? ?Current Medications: ?Current Outpatient Medications  ?Medication Sig Dispense Refill  ? acetaminophen (TYLENOL) 325 MG tablet Take 650 mg by mouth every 6 (six) hours as needed for mild pain or headache.    ? COVID-19 mRNA bivalent vaccine, Moderna, (MODERNA COVID-19 BIVAL BOOSTER) 50 MCG/0.5ML injection Inject into the muscle. (Patient not taking: Reported on 03/26/2021) 0.5 mL 0  ? fluticasone (FLONASE) 50 MCG/ACT nasal spray Place 2 sprays into both nostrils daily. 9.9 g 0  ? furosemide (LASIX) 20 MG tablet Take 1 tablet (20 mg total) by mouth daily. TAKE 1 TABLET(20 MG) BY MOUTH DAILY 30 tablet 11  ? gabapentin (NEURONTIN) 100 MG capsule Take one tablet twice daily 60 capsule 2  ? ibuprofen (ADVIL) 800 MG tablet Take 1 tablet (800 mg total) by mouth every 8 (eight) hours as needed. 30 tablet 2  ? influenza vac split quadrivalent PF (FLUARIX) 0.5 ML injection Inject into the muscle. (Patient not taking: Reported on 03/26/2021) 0.5 mL 0  ? IRON PO Take 1 tablet by mouth daily.    ? Lurasidone HCl (LATUDA) 60 MG TABS TAKE 1 TABLET(60 MG) BY MOUTH DAILY 90 tablet 3  ? melatonin 5 MG TABS Take 1 tablet (5 mg total) by mouth at bedtime as needed. 60 tablet 3  ? metFORMIN (GLUCOPHAGE) 500 MG tablet Take 1 tablet (500 mg total) by mouth 2 (two) times daily with a meal. 180 tablet 3  ? nystatin-triamcinolone ointment (MYCOLOG) Apply 1 application topically 2 (two) times daily. 30 g 0   ? ?No current facility-administered medications for this visit.  ? ? ? ?Musculoskeletal: ?Strength & Muscle Tone: N/A virtual visit ?Gait & Station: N/A virtual visit ?Patient leans: N/A ? ?Psychiatric Specialty Exam: ?Review of Systems  ?Psychiatric/Behavioral:  Negative for hallucinations, self-injury and suicidal ideas.   ?All other systems reviewed and are negative.  ?There were no vitals taken for this visit.There is no height or weight on file to calculate BMI.  ?General Appearance: Well-groomed  ?Eye Contact: Good  ?Speech: Normal  ?Volume: Normal  ?Mood: Euthymic  ?Affect:  Appropriate  ?Thought Process:  Goal Directed  ?Orientation:  Full (Time, Place, and Person)  ?Thought Content: Logical   ?Suicidal Thoughts:  No  ?Homicidal Thoughts:  No  ?Memory: Good  ?Judgement: Good  ?Insight: Good  ?  Psychomotor Activity: N/A  ?Concentration: Good  ?Recall: Good  ?Fund of Knowledge: Good  ?Language: Good  ?Akathisia: N/A  ?Handed: Right  ?AIMS (if indicated): not done  ?Assets:  Communication Skills ?Desire for Improvement  ?ADL's:  Intact  ?Cognition: WNL  ?Sleep:  Good  ? ?Screenings: ?GAD-7   ? ?Flowsheet Row Video Visit from 05/24/2021 in [facility] Video Visit from 02/19/2021 in [facility] Video Visit from 11/20/2020 in [facility] Video Visit from 08/21/2020 in [facility] Video Visit from 05/22/2020 in [facility]  ?Total GAD-7 Score 5 6 5 2 2   ? ?  ? ?PHQ2-9   ? ?Flowsheet Row Office Visit from 06/25/2021 in [facility] Video Visit from 05/24/2021 in [facility] Office Visit from 03/26/2021 in [facility] Video Visit from 02/19/2021 in [facility] Office Visit from 12/24/2020 in [facility]  ?PHQ-2 Total Score 0 0 0 0 0  ?PHQ-9 Total Score -- 2 -- 2 --   ? ?  ? ?Flowsheet Row Video Visit from 08/21/2020 [facility] from 08/12/2020 in [facility]  ?C-SSRS RISK CATEGORY No Risk No Risk  ? ?  ? ? ? ?Assessm

## 2021-09-06 ENCOUNTER — Other Ambulatory Visit (HOSPITAL_COMMUNITY): Payer: Self-pay

## 2021-09-16 ENCOUNTER — Other Ambulatory Visit (HOSPITAL_COMMUNITY): Payer: Self-pay | Admitting: Psychiatry

## 2021-09-16 DIAGNOSIS — F3181 Bipolar II disorder: Secondary | ICD-10-CM

## 2021-09-24 ENCOUNTER — Telehealth (HOSPITAL_COMMUNITY): Payer: Self-pay | Admitting: *Deleted

## 2021-09-24 ENCOUNTER — Ambulatory Visit: Payer: Self-pay | Admitting: Nurse Practitioner

## 2021-09-24 ENCOUNTER — Other Ambulatory Visit (HOSPITAL_COMMUNITY): Payer: Self-pay | Admitting: Physician Assistant

## 2021-09-24 ENCOUNTER — Ambulatory Visit (INDEPENDENT_AMBULATORY_CARE_PROVIDER_SITE_OTHER): Payer: Self-pay | Admitting: Nurse Practitioner

## 2021-09-24 ENCOUNTER — Other Ambulatory Visit: Payer: Self-pay

## 2021-09-24 VITALS — BP 126/77 | HR 77 | Temp 97.5°F | Ht 65.0 in | Wt 345.2 lb

## 2021-09-24 DIAGNOSIS — F3181 Bipolar II disorder: Secondary | ICD-10-CM

## 2021-09-24 DIAGNOSIS — M25531 Pain in right wrist: Secondary | ICD-10-CM

## 2021-09-24 DIAGNOSIS — E119 Type 2 diabetes mellitus without complications: Secondary | ICD-10-CM

## 2021-09-24 DIAGNOSIS — M659 Synovitis and tenosynovitis, unspecified: Secondary | ICD-10-CM

## 2021-09-24 LAB — POCT GLYCOSYLATED HEMOGLOBIN (HGB A1C)
HbA1c POC (<> result, manual entry): 6.3 % (ref 4.0–5.6)
HbA1c, POC (controlled diabetic range): 6.3 % (ref 0.0–7.0)
HbA1c, POC (prediabetic range): 6.3 % (ref 5.7–6.4)
Hemoglobin A1C: 6.3 % — AB (ref 4.0–5.6)

## 2021-09-24 MED ORDER — LURASIDONE HCL 60 MG PO TABS
ORAL_TABLET | ORAL | 0 refills | Status: DC
  Filled 2021-09-24: qty 30, 30d supply, fill #0

## 2021-09-24 NOTE — Patient Instructions (Signed)
You were seen today in the [facility] for reevaluation of chronic illness . Labs were collected, results will be available via MyChart or, if abnormal, you will be contacted by clinic staff. Please follow up in 6 mths  for reevaluation of chronic illness. ?

## 2021-09-24 NOTE — Progress Notes (Signed)
[place], [place]  [address] Phone:  [phone]   Fax:  [phone] Subjective:   Patient ID: Christie Walker, female    DOB: Feb 08, 1971, 51 y.o.   MRN: [id number]  Chief Complaint  Patient presents with   Follow-up    Pt is here for 3 months follow up. Pt stated she still has problems with her rt wrist pt is requesting a refill on ibuprofen    HPI Christie Walker 51 y.o. female  has a past medical history of Acid reflux, Bipolar 1 disorder ([place]), and Essential hypertension (06/14/2016). To the [facility] for follow up and right wrist pain.  Patient states that she has had right wrist pain for several months, has been taking ibuprofen 800 mg with no improvement in symptoms. Pain starts in the bilateral wrist and radiates to the posterior wrist, describes as stabbing. Pain increases with movement of thumb. States that previous provider order xray, which she completed 2-3 mths ago, but is uncertain of the results. Denies any other concerns today. Endorses monitoring diet, but denies adhering to any exercise regimen.   Denies any fatigue, chest pain, shortness of breath, HA or dizziness. Denies any blurred vision, numbness or tingling.   Past Medical History:  Diagnosis Date   Acid reflux    Bipolar 1 disorder ([place])    Essential hypertension 06/14/2016    No past surgical history on file.  Family History  Problem Relation Age of Onset   Mental illness Other     Social History   Socioeconomic History   Marital status: Legally Separated    Spouse name: Not on file   Number of children: Not on file   Years of education: Not on file   Highest education level: Not on file  Occupational History   Not on file  Tobacco Use   Smoking status: Never   Smokeless tobacco: Never  Vaping Use   Vaping Use: Never used  Substance and Sexual Activity   Alcohol use: No   Drug use: No   Sexual activity: Not Currently    Birth  control/protection: None  Other Topics Concern   Not on file  Social History Narrative   Not on file   Social Determinants of Health   Financial Resource Strain: Not on file  Food Insecurity: Not on file  Transportation Needs: Not on file  Physical Activity: Not on file  Stress: Not on file  Social Connections: Not on file  Intimate Partner Violence: Not on file    Outpatient Medications Prior to Visit  Medication Sig Dispense Refill   fluticasone (FLONASE) 50 MCG/ACT nasal spray Place 2 sprays into both nostrils daily. 9.9 g 0   furosemide (LASIX) 20 MG tablet Take 1 tablet (20 mg total) by mouth daily. TAKE 1 TABLET(20 MG) BY MOUTH DAILY 30 tablet 11   ibuprofen (ADVIL) 800 MG tablet Take 1 tablet (800 mg total) by mouth every 8 (eight) hours as needed. 30 tablet 2   IRON PO Take 1 tablet by mouth daily.     melatonin 5 MG TABS Take 1 tablet (5 mg total) by mouth at bedtime as needed. 60 tablet 3   metFORMIN (GLUCOPHAGE) 500 MG tablet Take 1 tablet (500 mg total) by mouth 2 (two) times daily with a meal. 180 tablet 3   gabapentin (NEURONTIN) 100 MG capsule Take one tablet twice daily 60 capsule 2   Lurasidone HCl (LATUDA) 60 MG TABS  TAKE 1 TABLET(60 MG) BY MOUTH DAILY 90 tablet 3   acetaminophen (TYLENOL) 325 MG tablet Take 650 mg by mouth every 6 (six) hours as needed for mild pain or headache. (Patient not taking: Reported on 09/24/2021)     COVID-19 mRNA bivalent vaccine, Moderna, (MODERNA COVID-19 BIVAL BOOSTER) 50 MCG/0.5ML injection Inject into the muscle. (Patient not taking: Reported on 03/26/2021) 0.5 mL 0   influenza vac split quadrivalent PF (FLUARIX) 0.5 ML injection Inject into the muscle. (Patient not taking: Reported on 03/26/2021) 0.5 mL 0   nystatin-triamcinolone ointment (MYCOLOG) Apply 1 application topically 2 (two) times daily. (Patient not taking: Reported on 09/24/2021) 30 g 0   No facility-administered medications prior to visit.    Allergies  Allergen  Reactions   Bee Venom Swelling    Swelling at site    Amoxicillin Hives and Other (See Comments)    Patient states that she can take this she just has a sensitivity when its taken along with pain medication   Hydrocodone Hives and Other (See Comments)    Sensitivity when taken along with antibiotics like amoxicillin per patient   Insect Extract Allergy Skin Test Other (See Comments)    Spiders - make tongue burn   Propoxyphene N-Acetaminophen Nausea And Vomiting and Rash    ** Darvacet    Review of Systems  Constitutional:  Negative for chills, fever and malaise/fatigue.  Respiratory:  Negative for cough and shortness of breath.   Cardiovascular:  Negative for chest pain, palpitations and leg swelling.  Gastrointestinal:  Negative for abdominal pain, blood in stool, constipation, diarrhea, nausea and vomiting.  Musculoskeletal:  Positive for joint pain. Negative for back pain, falls, myalgias and neck pain.  Skin: Negative.   Neurological: Negative.   Psychiatric/Behavioral:  Negative for depression. The patient is not nervous/anxious.   All other systems reviewed and are negative.     Objective:    Physical Exam Vitals reviewed.  Constitutional:      General: She is not in acute distress.    Appearance: Normal appearance. She is obese.  HENT:     Head: Normocephalic.  Cardiovascular:     Rate and Rhythm: Normal rate and regular rhythm.     Pulses: Normal pulses.     Heart sounds: Normal heart sounds.     Comments: No obvious peripheral edema Pulmonary:     Effort: Pulmonary effort is normal.     Breath sounds: Normal breath sounds.  Musculoskeletal:        General: No swelling, tenderness, deformity or signs of injury. Normal range of motion.     Right lower leg: No edema.     Left lower leg: No edema.  Skin:    General: Skin is warm and dry.     Capillary Refill: Capillary refill takes less than 2 seconds.  Neurological:     General: No focal deficit present.      Mental Status: She is alert and oriented to person, place, and time.  Psychiatric:        Mood and Affect: Mood normal.        Behavior: Behavior normal.        Thought Content: Thought content normal.        Judgment: Judgment normal.    BP 126/77 (BP Location: Right Arm, Patient Position: Sitting, Cuff Size: Large)   Pulse 77   Temp (!) 97.5 F (36.4 C)   Ht $R'5\' 5"'yB$  (1.651 m)   Wt (!) 345 lb  4 oz (156.6 kg)   SpO2 96%   BMI 57.45 kg/m  Wt Readings from Last 3 Encounters:  09/24/21 (!) 345 lb 4 oz (156.6 kg)  07/22/21 (!) 345 lb 6.4 oz (156.7 kg)  06/25/21 (!) 347 lb 0.2 oz (157.4 kg)    Immunization History  Administered Date(s) Administered   Moderna Covid-19 Vaccine Bivalent Booster 34yrs & up 02/23/2021   Tdap 08/04/2016    Diabetic Foot Exam - Simple   No data filed     Lab Results  Component Value Date   TSH 1.080 10/04/2019   Lab Results  Component Value Date   WBC 5.8 09/24/2021   HGB 13.1 09/24/2021   HCT 39.5 09/24/2021   MCV 91 09/24/2021   PLT 272 09/24/2021   Lab Results  Component Value Date   NA 149 (H) 09/24/2021   K 4.5 09/24/2021   CO2 22 09/24/2021   GLUCOSE 109 (H) 09/24/2021   BUN 10 09/24/2021   CREATININE 0.77 09/24/2021   BILITOT 0.2 09/24/2021   ALKPHOS 95 09/24/2021   AST 25 09/24/2021   ALT 28 09/24/2021   PROT 6.9 09/24/2021   ALBUMIN 4.3 09/24/2021   CALCIUM 9.9 09/24/2021   ANIONGAP 10 08/13/2019   EGFR 95 09/24/2021   Lab Results  Component Value Date   CHOL 229 (H) 09/24/2021   CHOL 211 (H) 12/24/2020   CHOL 242 (H) 08/06/2020   Lab Results  Component Value Date   HDL 46 09/24/2021   HDL 47 12/24/2020   HDL 43 08/06/2020   Lab Results  Component Value Date   LDLCALC 154 (H) 09/24/2021   LDLCALC 132 (H) 12/24/2020   LDLCALC 159 (H) 08/06/2020   Lab Results  Component Value Date   TRIG 162 (H) 09/24/2021   TRIG 180 (H) 12/24/2020   TRIG 219 (H) 08/06/2020   Lab Results  Component Value Date    CHOLHDL 5.0 (H) 09/24/2021   CHOLHDL 4.5 (H) 12/24/2020   CHOLHDL 5.6 (H) 08/06/2020   Lab Results  Component Value Date   HGBA1C 6.3 (A) 09/24/2021   HGBA1C 6.3 09/24/2021   HGBA1C 6.3 09/24/2021   HGBA1C 6.3 09/24/2021       Assessment & Plan:   Problem List Items Addressed This Visit       Endocrine   Type 2 diabetes mellitus without complication, without long-term current use of insulin ([place]) - Primary   Relevant Orders   POCT glycosylated hemoglobin (Hb A1C) (Completed): 6.3, at goal    CBC with Differential/Platelet (Completed)   Comprehensive metabolic panel (Completed)   Lipid panel (Completed) Encouraged continued diet and exercise efforts  Encouraged continued compliance with medication     Other Visit Diagnoses     Wrist pain, acute, right       Relevant Orders   AMB referral to orthopedics Discussed obtaining a wrist splint  Discussed non pharmacological methods for management of symptoms Informed to take OTC medications as needed Encouraged usage of NSAIDS   Tenosynovitis       Relevant Orders   AMB referral to orthopedics Discussed obtaining a wrist splint  Discussed non pharmacological methods for management of symptoms Informed to take OTC medications as needed Encouraged usage of NSAIDS   Follow up in 6 mths for reevaluation of chronic illness, sooner as needed     I am having Christie Walker maintain her IRON PO, acetaminophen, fluticasone, nystatin-triamcinolone ointment, metFORMIN, influenza vac split quadrivalent PF, Moderna COVID-19 Bival Booster, furosemide,  melatonin, and ibuprofen.  No orders of the defined types were placed in this encounter.    Teena Dunk, NP

## 2021-09-24 NOTE — Telephone Encounter (Signed)
Patient called for her Latuda to be sent in to her pharmacy. Her provider does not work on fri which is today. Patient should be out. She has a future appt on 6/12 will ask the provider working today to send it to the [facility] pharmacy and will notify her when its sent

## 2021-09-24 NOTE — Progress Notes (Signed)
Patient is currently without Latuda medication due to no follow-up appointment.  Provider to bridge patient's medication until next follow-up encounter.  Patient next appointment is scheduled for 10/18/2021.  Patient's medication to be e-prescribed to pharmacy of choice.

## 2021-09-24 NOTE — Telephone Encounter (Signed)
Call from patient after first checking with the pharmacy that she did not have any Latuda available. She should have run out on 09/21/21. Her provider doesn't work on Fri. Will forward request to her supervisor to see if she will send a new rx in to [facility] on her behalf. She has a future appt on 10/18/21.

## 2021-09-25 LAB — CBC WITH DIFFERENTIAL/PLATELET
Basophils Absolute: 0 10*3/uL (ref 0.0–0.2)
Basos: 1 %
EOS (ABSOLUTE): 0.2 10*3/uL (ref 0.0–0.4)
Eos: 3 %
Hematocrit: 39.5 % (ref 34.0–46.6)
Hemoglobin: 13.1 g/dL (ref 11.1–15.9)
Immature Grans (Abs): 0 10*3/uL (ref 0.0–0.1)
Immature Granulocytes: 0 %
Lymphocytes Absolute: 2.3 10*3/uL (ref 0.7–3.1)
Lymphs: 39 %
MCH: 30.3 pg (ref 26.6–33.0)
MCHC: 33.2 g/dL (ref 31.5–35.7)
MCV: 91 fL (ref 79–97)
Monocytes Absolute: 0.4 10*3/uL (ref 0.1–0.9)
Monocytes: 6 %
Neutrophils Absolute: 3 10*3/uL (ref 1.4–7.0)
Neutrophils: 51 %
Platelets: 272 10*3/uL (ref 150–450)
RBC: 4.33 x10E6/uL (ref 3.77–5.28)
RDW: 14.4 % (ref 11.7–15.4)
WBC: 5.8 10*3/uL (ref 3.4–10.8)

## 2021-09-25 LAB — LIPID PANEL
Chol/HDL Ratio: 5 ratio — ABNORMAL HIGH (ref 0.0–4.4)
Cholesterol, Total: 229 mg/dL — ABNORMAL HIGH (ref 100–199)
HDL: 46 mg/dL (ref >39)
LDL Chol Calc (NIH): 154 mg/dL — ABNORMAL HIGH (ref 0–99)
Triglycerides: 162 mg/dL — ABNORMAL HIGH (ref 0–149)
VLDL Cholesterol Cal: 29 mg/dL (ref 5–40)

## 2021-09-25 LAB — COMPREHENSIVE METABOLIC PANEL
ALT: 28 IU/L (ref 0–32)
AST: 25 IU/L (ref 0–40)
Albumin/Globulin Ratio: 1.7 (ref 1.2–2.2)
Albumin: 4.3 g/dL (ref 3.8–4.8)
Alkaline Phosphatase: 95 IU/L (ref 44–121)
BUN/Creatinine Ratio: 13 (ref 9–23)
BUN: 10 mg/dL (ref 6–24)
Bilirubin Total: 0.2 mg/dL (ref 0.0–1.2)
CO2: 22 mmol/L (ref 20–29)
Calcium: 9.9 mg/dL (ref 8.7–10.2)
Chloride: 108 mmol/L — ABNORMAL HIGH (ref 96–106)
Creatinine, Ser: 0.77 mg/dL (ref 0.57–1.00)
Globulin, Total: 2.6 g/dL (ref 1.5–4.5)
Glucose: 109 mg/dL — ABNORMAL HIGH (ref 70–99)
Potassium: 4.5 mmol/L (ref 3.5–5.2)
Sodium: 149 mmol/L — ABNORMAL HIGH (ref 134–144)
Total Protein: 6.9 g/dL (ref 6.0–8.5)
eGFR: 95 mL/min/{1.73_m2} (ref >59)

## 2021-09-29 ENCOUNTER — Encounter: Payer: Self-pay | Admitting: Nurse Practitioner

## 2021-10-05 ENCOUNTER — Other Ambulatory Visit: Payer: Self-pay | Admitting: Nurse Practitioner

## 2021-10-05 ENCOUNTER — Encounter: Payer: Self-pay | Admitting: Orthopedic Surgery

## 2021-10-05 ENCOUNTER — Ambulatory Visit (INDEPENDENT_AMBULATORY_CARE_PROVIDER_SITE_OTHER): Payer: Self-pay | Admitting: Orthopedic Surgery

## 2021-10-05 DIAGNOSIS — E782 Mixed hyperlipidemia: Secondary | ICD-10-CM

## 2021-10-05 DIAGNOSIS — M654 Radial styloid tenosynovitis [de Quervain]: Secondary | ICD-10-CM

## 2021-10-05 MED ORDER — BETAMETHASONE SOD PHOS & ACET 6 (3-3) MG/ML IJ SUSP
6.0000 mg | INTRAMUSCULAR | Status: AC | PRN
  Administered 2021-10-05: 6 mg via INTRA_ARTICULAR

## 2021-10-05 MED ORDER — ATORVASTATIN CALCIUM 40 MG PO TABS: 40.0000 mg | ORAL_TABLET | Freq: Every day | ORAL | 3 refills | Status: DC

## 2021-10-05 MED ORDER — LIDOCAINE HCL 1 % IJ SOLN
1.0000 mL | INTRAMUSCULAR | Status: AC | PRN
  Administered 2021-10-05: 1 mL

## 2021-10-05 NOTE — Progress Notes (Signed)
Office Visit Note   Patient: Christie Walker           Date of Birth: 05-23-1970           MRN: [id number] Visit Date: 10/05/2021              Requested by: Orion Crook I, NP [address] [place],  [place] [address] PCP: Barbette Merino, NP   Assessment & Plan: Visit Diagnoses:  1. De Quervain's tenosynovitis, right     Plan: Discussed with patient that her history exam findings are most consistent with de Quervain's tenosynovitis.  We reviewed the nature of this condition as well as his diagnosis, prognosis, and both conservative and surgical treatment options.  After our discussion, she would like to proceed with a corticosteroid junction of the right first dorsal compartment.  We reviewed the risks of injections including bleeding, infection, alterations in blood glucose, and incomplete symptom relief.  I can see her back in 2 months if she still symptomatic.  Follow-Up Instructions: No follow-ups on file.   Orders:  No orders of the defined types were placed in this encounter.  No orders of the defined types were placed in this encounter.     Procedures: Hand/UE Inj: R extensor compartment 1 for de Quervain's tenosynovitis on 10/05/2021 10:15 AM Indications: tendon swelling and therapeutic Details: 25 G needle, radial approach Medications: 1 mL lidocaine 1 %; 6 mg betamethasone acetate-betamethasone sodium phosphate 6 (3-3) MG/ML Procedure, treatment alternatives, risks and benefits explained, specific risks discussed. Consent was given by the patient. Immediately prior to procedure a time out was called to verify the correct patient, procedure, equipment, support staff and site/side marked as required. Patient was prepped and draped in the usual sterile fashion.      Clinical Data: No additional findings.   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  Is been going on for several months now.  She describes pain at the radial aspect of the wrist that is worse with activity.  She was seen by her primary care provider for the similar finding in March and was put on oral corticosteroid.  She notes that the swelling nearly resolved but the pain never went away.  She is taking Tylenol and ibuprofen with very modest symptom relief.  Her pain can be as bad as 10 out of 10 with certain activities.  She is never worn any braces or done any form of immobilization.   Review of Systems   Objective: Vital Signs: BP 99/66 (BP Location: Left Arm, Patient Position: Sitting)   Pulse 98   Ht 5\' 5"  (1.651 m)   Wt (!) 345 lb 8 oz (156.7 kg)   BMI 57.49 kg/m   Physical Exam Constitutional:      Appearance: Normal appearance.  Cardiovascular:     Rate and Rhythm: Normal rate.     Pulses: Normal pulses.  Pulmonary:     Effort: Pulmonary effort is normal.  Skin:    General: Skin is warm and dry.     Capillary Refill: Capillary refill takes less than 2 seconds.  Neurological:     Mental Status: She is alert.  Right Hand Exam   Tenderness  Right hand tenderness location: TTP at radial styloid.  Range of Motion  The patient has normal right wrist ROM.   Other  Erythema: absent Sensation: normal Pulse: present  Comments:  Positive Finkelstein test w/ reproduction of her pain.  No TTP at SL interval or anatomic snuffbox.  No thumb triggering.       Specialty Comments:  No specialty comments available.  Imaging: No results found.   PMFS History: Patient Active Problem List   Diagnosis Date Noted   De Quervain's tenosynovitis, right 10/05/2021   Bipolar 2 disorder (HCC)    Type 2 diabetes mellitus without complication, without long-term current  use of insulin (HCC)    Obesity, Class III, BMI 40-49.9 (morbid obesity) (HCC)    Pneumonia due to COVID-19 virus 07/31/2019   SIRS (systemic inflammatory response syndrome) (HCC) 07/31/2019   Chest tightness 07/31/2019   Chest pain at rest 08/05/2016   Morbid obesity with BMI of 50.0-59.9, adult (HCC) 08/05/2016   Dyspnea on exertion 08/05/2016   HTN (hypertension) 06/14/2016   Metabolic syndrome 06/14/2016   Chronic knee pain 06/14/2016   OBESITY 03/08/2007   TMJ SYNDROME 03/08/2007   GERD 03/08/2007   Past Medical History:  Diagnosis Date   Acid reflux    Bipolar 1 disorder (HCC)    Essential hypertension 06/14/2016    Family History  Problem Relation Age of Onset   Mental illness Other     No past surgical history on file. Social History   Occupational History   Not on file  Tobacco Use   Smoking status: Never   Smokeless tobacco: Never  Vaping Use   Vaping Use: Never used  Substance and Sexual Activity   Alcohol use: No   Drug use: No   Sexual activity: Not Currently    Birth control/protection: None

## 2021-10-07 ENCOUNTER — Other Ambulatory Visit (HOSPITAL_BASED_OUTPATIENT_CLINIC_OR_DEPARTMENT_OTHER): Payer: Self-pay

## 2021-10-18 ENCOUNTER — Telehealth (INDEPENDENT_AMBULATORY_CARE_PROVIDER_SITE_OTHER): Payer: No Payment, Other | Admitting: Psychiatry

## 2021-10-18 ENCOUNTER — Other Ambulatory Visit: Payer: Self-pay

## 2021-10-18 ENCOUNTER — Encounter (HOSPITAL_COMMUNITY): Payer: Self-pay | Admitting: Psychiatry

## 2021-10-18 DIAGNOSIS — F3181 Bipolar II disorder: Secondary | ICD-10-CM | POA: Diagnosis not present

## 2021-10-18 MED ORDER — GABAPENTIN 100 MG PO CAPS: ORAL_CAPSULE | ORAL | 3 refills | Status: DC

## 2021-10-18 MED ORDER — LURASIDONE HCL 60 MG PO TABS
ORAL_TABLET | ORAL | 3 refills | Status: DC
  Filled 2021-10-18 – 2021-11-09 (×2): qty 30, 30d supply, fill #0
  Filled 2021-12-15: qty 30, 30d supply, fill #1
  Filled 2022-01-14: qty 30, 30d supply, fill #2

## 2021-10-18 MED ORDER — MELATONIN 5 MG PO TABS: 5.0000 mg | ORAL_TABLET | Freq: Every evening | ORAL | 3 refills | Status: DC | PRN

## 2021-10-18 NOTE — Progress Notes (Signed)
BH MD/PA/NP OP Progress Note Virtual Visit via Video Note  I connected with Christie Walker on 10/18/21 at  1:30 PM EDT by a video enabled telemedicine application and verified that I am speaking with the correct person using two identifiers.  Location: Patient: Home Provider: Clinic   I discussed the limitations of evaluation and management by telemedicine and the availability of in person appointments. The patient expressed understanding and agreed to proceed.  I provided of non-face-to-face time during this encounter.     10/18/2021 1:23 PM Christie Walker  MRN:  [id number]  Chief Complaint: "I am doing just fine".   HPI:  51 year old female seen today for follow up psychiatric evaluation. She has a psychiatric history of bipolar disorder and and schizophrenia.  She is currently managed on gabapentin 100 mg twice daily, Melatonin 5 mg nightly, and Latuda 60 mg daily.  She notes her medications are effective in managing her psychiatric conditions.   Today patient is pleasant, calm, cooperative, maintained eye contact, and engaged in conversation. She informed provider that since her last visit things have been going well.  She informed Clinical research associate that she is looking forward to taking a vacation with her brother and her mother.  She notes that they will be traveling to [facility] next week.  Patient reports her mood is stable and denies symptoms of anxiety and depression.  At this time she requested not to do a GAD-7 or PHQ-9.  These assessments can be done at her next visit.  Today she denies SI/HI/VAH, mania, paranoia.  She endorses having adequate sleep and appetite.     At this time no medication changes made.  Patient will follow-up outpatient counseling for therapy.  No other concerns at this time     Visit Diagnosis:    ICD-10-CM   1. Bipolar 2 disorder (HCC)  F31.81 Lurasidone HCl (LATUDA) 60 MG TABS    melatonin 5 MG TABS    gabapentin (NEURONTIN) 100 MG  capsule      Past Psychiatric History:  bipolar disorder and and schizophrenia  Past Medical History:  Past Medical History:  Diagnosis Date   Acid reflux    Bipolar 1 disorder (HCC)    Essential hypertension 06/14/2016   History reviewed. No pertinent surgical history.  Family Psychiatric History: Paternal aunts bipolar disorder and father has mental health conditions but unaware which one.   Family History:  Family History  Problem Relation Age of Onset   Mental illness Other     Social History:  Social History   Socioeconomic History   Marital status: Legally Separated    Spouse name: Not on file   Number of children: Not on file   Years of education: Not on file   Highest education level: Not on file  Occupational History   Not on file  Tobacco Use   Smoking status: Never   Smokeless tobacco: Never  Vaping Use   Vaping Use: Never used  Substance and Sexual Activity   Alcohol use: No   Drug use: No   Sexual activity: Not Currently    Birth control/protection: None  Other Topics Concern   Not on file  Social History Narrative   Not on file   Social Determinants of Health   Financial Resource Strain: Not on file  Food Insecurity: Not on file  Transportation Needs: Not on file  Physical Activity: Not on file  Stress: Not on file  Social Connections: Not on file  Allergies:  Allergies  Allergen Reactions   Bee Venom Swelling    Swelling at site    Amoxicillin Hives and Other (See Comments)    Patient states that she can take this she just has a sensitivity when its taken along with pain medication   Hydrocodone Hives and Other (See Comments)    Sensitivity when taken along with antibiotics like amoxicillin per patient   Insect Extract Allergy Skin Test Other (See Comments)    Spiders - make tongue burn   Propoxyphene N-Acetaminophen Nausea And Vomiting and Rash    ** Darvacet    Metabolic Disorder Labs: Lab Results  Component Value Date    HGBA1C 6.3 (A) 09/24/2021   HGBA1C 6.3 09/24/2021   HGBA1C 6.3 09/24/2021   HGBA1C 6.3 09/24/2021   MPG 139.85 08/06/2019   MPG 128.37 08/01/2019   No results found for: "PROLACTIN" Lab Results  Component Value Date   CHOL 229 (H) 09/24/2021   TRIG 162 (H) 09/24/2021   HDL 46 09/24/2021   CHOLHDL 5.0 (H) 09/24/2021   VLDL 32 (H) 06/14/2016   LDLCALC 154 (H) 09/24/2021   LDLCALC 132 (H) 12/24/2020   Lab Results  Component Value Date   TSH 1.080 10/04/2019   TSH 0.262 (L) 08/06/2019    Therapeutic Level Labs: No results found for: "LITHIUM" No results found for: "VALPROATE" No results found for: "CBMZ"  Current Medications: Current Outpatient Medications  Medication Sig Dispense Refill   acetaminophen (TYLENOL) 325 MG tablet Take 650 mg by mouth every 6 (six) hours as needed for mild pain or headache. (Patient not taking: Reported on 09/24/2021)     atorvastatin (LIPITOR) 40 MG tablet Take 1 tablet (40 mg total) by mouth daily. 90 tablet 3   COVID-19 mRNA bivalent vaccine, Moderna, (MODERNA COVID-19 BIVAL BOOSTER) 50 MCG/0.5ML injection Inject into the muscle. (Patient not taking: Reported on 03/26/2021) 0.5 mL 0   fluticasone (FLONASE) 50 MCG/ACT nasal spray Place 2 sprays into both nostrils daily. 9.9 g 0   furosemide (LASIX) 20 MG tablet Take 1 tablet (20 mg total) by mouth daily. TAKE 1 TABLET(20 MG) BY MOUTH DAILY 30 tablet 11   gabapentin (NEURONTIN) 100 MG capsule TAKE 1 CAPSULE BY MOUTH TWICE DAILY 60 capsule 3   ibuprofen (ADVIL) 800 MG tablet Take 1 tablet (800 mg total) by mouth every 8 (eight) hours as needed. 30 tablet 2   influenza vac split quadrivalent PF (FLUARIX) 0.5 ML injection Inject into the muscle. (Patient not taking: Reported on 03/26/2021) 0.5 mL 0   IRON PO Take 1 tablet by mouth daily.     Lurasidone HCl (LATUDA) 60 MG TABS TAKE 1 TABLET(60 MG) BY MOUTH DAILY 30 tablet 3   melatonin 5 MG TABS Take 1 tablet (5 mg total) by mouth at bedtime as needed.  60 tablet 3   metFORMIN (GLUCOPHAGE) 500 MG tablet Take 1 tablet (500 mg total) by mouth 2 (two) times daily with a meal. 180 tablet 3   nystatin-triamcinolone ointment (MYCOLOG) Apply 1 application topically 2 (two) times daily. (Patient not taking: Reported on 09/24/2021) 30 g 0   No current facility-administered medications for this visit.     Musculoskeletal: Strength & Muscle Tone:  Unable to assess due to telehealth visit Gait & Station:  Unable to assess due to telehealth visit Patient leans: N/A  Psychiatric Specialty Exam: Review of Systems  There were no vitals taken for this visit.There is no height or weight on file to calculate  BMI.  General Appearance: Well Groomed  Eye Contact:  Good  Speech:  Clear and Coherent and Normal Rate  Volume:  Normal  Mood:  Euthymic  Affect:  Appropriate and Congruent  Thought Process:  Coherent, Goal Directed and Linear  Orientation:  Full (Time, Place, and Person)  Thought Content: WDL and Logical   Suicidal Thoughts:  No  Homicidal Thoughts:  No  Memory:  Immediate;   Good Recent;   Good Remote;   Good  Judgement:  Good  Insight:  Good  Psychomotor Activity:  Normal  Concentration:  Concentration: Good and Attention Span: Good  Recall:  Good  Fund of Knowledge: Good  Language: Good  Akathisia:  No  Handed:  Right  AIMS (if indicated): Not done  Assets:  Communication Skills Desire for Improvement Financial Resources/Insurance Housing Social Support  ADL's:  Intact  Cognition: WNL  Sleep:  Good   Screenings: GAD-7    Flowsheet Row [facility]  Total GAD-7 Score 5 6 5 2 2        PHQ2-9    Flowsheet Row Office Visit from 09/24/2021 in [facility] Office Visit [facility]  PHQ-2 Total Score 0 0 0 0 0  PHQ-9 Total Score -- -- 2 -- 2      Flowsheet Row [facility] from 08/12/2020 in [facility]  C-SSRS RISK CATEGORY No Risk No Risk        Assessment and Plan: Patient informed writer that she i is doing well on her current medication regimen.  No medication changes made today.  Patient agreeable to continue medications as prescribed   1. Bipolar 2 disorder (HCC)  Continue- gabapentin (NEURONTIN) 100 MG capsule; Take 1 capsule (100 mg total) by mouth 2 (two) times daily.  Dispense: 60 capsule; Refill: 3 Continue- lurasidone 60 MG TABS; Take 1 tablet (60 mg total) by mouth daily.  Dispense: 90 tablet; Refill: 3 Continue- melatonin 5 MG TABS; Take 1 tablet (5 mg total) by mouth at bedtime as needed.  Dispense: 60 tablet; Refill: 3  Follow-up in 3 months  Shanna CiscoBrittney E Mayanna Garlitz, NP  10/18/2021, 1:23 PM

## 2021-10-25 ENCOUNTER — Other Ambulatory Visit: Payer: Self-pay

## 2021-11-10 ENCOUNTER — Other Ambulatory Visit: Payer: Self-pay

## 2021-11-11 ENCOUNTER — Other Ambulatory Visit: Payer: Self-pay

## 2021-11-24 ENCOUNTER — Emergency Department (HOSPITAL_COMMUNITY): Payer: Self-pay

## 2021-11-24 ENCOUNTER — Encounter (HOSPITAL_COMMUNITY): Payer: Self-pay

## 2021-11-24 ENCOUNTER — Other Ambulatory Visit: Payer: Self-pay

## 2021-11-24 ENCOUNTER — Emergency Department (HOSPITAL_COMMUNITY)
Admission: EM | Admit: 2021-11-24 | Discharge: 2021-11-25 | Disposition: A | Discharge status: Home / Self Care | Payer: Self-pay | Attending: Emergency Medicine | Admitting: Emergency Medicine

## 2021-11-24 DIAGNOSIS — Z20822 Contact with and (suspected) exposure to covid-19: Secondary | ICD-10-CM | POA: Insufficient documentation

## 2021-11-24 DIAGNOSIS — I10 Essential (primary) hypertension: Secondary | ICD-10-CM | POA: Insufficient documentation

## 2021-11-24 DIAGNOSIS — J189 Pneumonia, unspecified organism: Secondary | ICD-10-CM

## 2021-11-24 DIAGNOSIS — R519 Headache, unspecified: Secondary | ICD-10-CM | POA: Insufficient documentation

## 2021-11-24 DIAGNOSIS — E119 Type 2 diabetes mellitus without complications: Secondary | ICD-10-CM | POA: Insufficient documentation

## 2021-11-24 DIAGNOSIS — J181 Lobar pneumonia, unspecified organism: Secondary | ICD-10-CM | POA: Insufficient documentation

## 2021-11-24 DIAGNOSIS — Z7984 Long term (current) use of oral hypoglycemic drugs: Secondary | ICD-10-CM | POA: Insufficient documentation

## 2021-11-24 MED ORDER — ACETAMINOPHEN 500 MG PO TABS
1000.0000 mg | ORAL_TABLET | Freq: Once | ORAL | Status: AC
  Administered 2021-11-25: 1000 mg via ORAL
  Filled 2021-11-24: qty 2

## 2021-11-24 NOTE — ED Provider Triage Note (Signed)
Emergency Medicine Provider Triage Evaluation Note  Christie Walker , a 51 y.o. female  was evaluated in triage.  Pt complains of flu sxs. Report headache, fever, chills, cough, weak on and off x 1 wk.  No n/v/d, no dysuria, no recent sick contact  Review of Systems  Positive: As above Negative: As above  Physical Exam  BP (!) 156/106 (BP Location: Left Arm)   Pulse 94   Temp 99.9 F (37.7 C) (Oral)   Resp 18   SpO2 94%  Gen:   Awake, no distress   Resp:  Normal effort  MSK:   Moves extremities without difficulty  Other:    Medical Decision Making  Medically screening exam initiated at 11:39 PM.  Appropriate orders placed.  Garrie Elenes Deschler was informed that the remainder of the evaluation will be completed by another provider, this initial triage assessment does not replace that evaluation, and the importance of remaining in the ED until their evaluation is complete.     Fayrene Helper, PA-C 11/24/21 2340

## 2021-11-24 NOTE — ED Triage Notes (Signed)
Pt reports with fever, chills, headache, and cough x 1 week.

## 2021-11-25 LAB — SARS CORONAVIRUS 2 BY RT PCR: SARS Coronavirus 2 by RT PCR: NEGATIVE

## 2021-11-25 MED ORDER — DOXYCYCLINE HYCLATE 100 MG PO TABS
100.0000 mg | ORAL_TABLET | Freq: Once | ORAL | Status: AC
Start: 2021-11-25 — End: 2021-11-25
  Administered 2021-11-25: 100 mg via ORAL
  Filled 2021-11-25: qty 1

## 2021-11-25 MED ORDER — BENZONATATE 100 MG PO CAPS
100.0000 mg | ORAL_CAPSULE | Freq: Once | ORAL | Status: AC
  Administered 2021-11-25: 100 mg via ORAL
  Filled 2021-11-25: qty 1

## 2021-11-25 MED ORDER — BENZONATATE 100 MG PO CAPS: 100.0000 mg | ORAL_CAPSULE | Freq: Three times a day (TID) | ORAL | 0 refills | Status: DC

## 2021-11-25 MED ORDER — DOXYCYCLINE HYCLATE 100 MG PO CAPS: 100.0000 mg | ORAL_CAPSULE | Freq: Two times a day (BID) | ORAL | 0 refills | Status: DC

## 2021-11-25 NOTE — Discharge Instructions (Addendum)
Take the prescribed medication as directed.  Make sure to rest and hydrate. Follow-up with your primary care doctor. Return to the ED for new or worsening symptoms. 

## 2021-11-25 NOTE — ED Provider Notes (Signed)
[facility]-EMERGENCY DEPT Provider Note   CSN: [id number] Arrival date & time: 11/24/21  2247     History  Chief Complaint  Patient presents with   Fever   Chills   Headache   Cough    Christie Walker is a 51 y.o. female.  The history is provided by the patient and medical records.  Fever Associated symptoms: cough, headaches and myalgias   Headache Associated symptoms: cough, fever and myalgias   Cough Associated symptoms: fever, headaches and myalgias    51 year old female with history of bipolar disorder, GERD, hypertension, obesity, DM 2, presenting to the ED with 1 week of flulike symptoms.  She reports fever, chills, headache, generalized body aches, and dry cough.  States symptoms seem to be waxing and waning in severity.  She has not had any vomiting but appetite is poor.  She is still able to eat and drink some but in small amounts.  No diarrhea.  No abdominal pain.  Denies any chest pain or shortness of breath.  No sick contacts or COVID exposures.  Has been using Tylenol for fever at home, no other medications tried.  Home Medications Prior to Admission medications   Medication Sig Start Date End Date Taking? Authorizing Provider  acetaminophen (TYLENOL) 325 MG tablet Take 650 mg by mouth every 6 (six) hours as needed for mild pain or headache.   Yes [provider]  benzonatate (TESSALON) 100 MG capsule Take 1 capsule (100 mg total) by mouth every 8 (eight) hours. 11/25/21  Yes Garlon Hatchet, PA-C  doxycycline (VIBRAMYCIN) 100 MG capsule Take 1 capsule (100 mg total) by mouth 2 (two) times daily. 11/25/21  Yes Garlon Hatchet, PA-C  fluticasone (FLONASE) 50 MCG/ACT nasal spray Place 2 sprays into both nostrils daily. Patient taking differently: Place 2 sprays into both nostrils daily as needed for allergies. 08/14/19  Yes Rodolph Bong, MD  furosemide (LASIX) 20 MG tablet Take 1 tablet (20 mg total) by mouth daily. TAKE 1  TABLET(20 MG) BY MOUTH DAILY 03/26/21 03/26/22 Yes King, Shana Chute, NP  gabapentin (NEURONTIN) 100 MG capsule TAKE 1 CAPSULE BY MOUTH TWICE DAILY Patient taking differently: Take 100 mg by mouth 2 (two) times daily. 10/18/21  Yes Toy Cookey E, NP  ibuprofen (ADVIL) 800 MG tablet Take 1 tablet (800 mg total) by mouth every 8 (eight) hours as needed. Patient taking differently: Take 800 mg by mouth every 8 (eight) hours as needed for moderate pain. 07/30/21  Yes King, Shana Chute, NP  IRON PO Take 1 tablet by mouth daily.   Yes [provider]  Lurasidone HCl (LATUDA) 60 MG TABS TAKE 1 TABLET(60 MG) BY MOUTH ONCE DAILY. Patient taking differently: Take 60 mg by mouth daily. 10/18/21  Yes Toy Cookey E, NP  melatonin 5 MG TABS Take 1 tablet (5 mg total) by mouth at bedtime as needed. Patient taking differently: Take 5 mg by mouth at bedtime as needed (for sleep). 10/18/21  Yes Toy Cookey E, NP  metFORMIN (GLUCOPHAGE) 500 MG tablet Take 1 tablet (500 mg total) by mouth 2 (two) times daily with a meal. 02/02/21 02/02/22 Yes King, Shana Chute, NP  atorvastatin (LIPITOR) 40 MG tablet Take 1 tablet (40 mg total) by mouth daily. Patient not taking: Reported on 11/25/2021 10/05/21   Orion Crook I, NP  COVID-19 mRNA bivalent vaccine, Moderna, (MODERNA COVID-19 BIVAL BOOSTER) 50 MCG/0.5ML injection Inject into the muscle. Patient not taking: Reported on 03/26/2021 02/23/21  Judyann Munson, MD  influenza vac split quadrivalent PF (FLUARIX) 0.5 ML injection Inject into the muscle. Patient not taking: Reported on 03/26/2021 02/23/21   Judyann Munson, MD      Allergies    Bee venom, Amoxicillin, Hydrocodone, Insect extract, and Propoxyphene n-acetaminophen    Review of Systems   Review of Systems  Constitutional:  Positive for fever.  Respiratory:  Positive for cough.   Musculoskeletal:  Positive for myalgias.  Neurological:  Positive for headaches.  All other systems reviewed  and are negative.   Physical Exam Updated Vital Signs BP 132/88   Pulse 88   Temp (!) 100.4 F (38 C) (Oral)   Resp 16   SpO2 96%   Physical Exam Vitals and nursing note reviewed.  Constitutional:      Appearance: She is well-developed.  HENT:     Head: Normocephalic and atraumatic.     Nose: Congestion present.  Eyes:     Conjunctiva/sclera: Conjunctivae normal.     Pupils: Pupils are equal, round, and reactive to light.  Cardiovascular:     Rate and Rhythm: Normal rate and regular rhythm.     Heart sounds: Normal heart sounds.  Pulmonary:     Effort: Pulmonary effort is normal.     Breath sounds: Normal breath sounds.     Comments: No acute distress, speaking in sentences without difficulty, coarse breath sounds on right, dry cough on exam Abdominal:     General: Bowel sounds are normal.     Palpations: Abdomen is soft.  Musculoskeletal:        General: Normal range of motion.     Cervical back: Normal range of motion.  Skin:    General: Skin is warm and dry.  Neurological:     Mental Status: She is alert and oriented to person, place, and time.     ED Results / Procedures / Treatments   Labs (all labs ordered are listed, but only abnormal results are displayed) Labs Reviewed  SARS CORONAVIRUS 2 BY RT PCR  POC URINE PREG, ED    EKG None  Radiology DG Chest 2 View  Result Date: 11/24/2021 CLINICAL DATA:  Fever chills EXAM: CHEST - 2 VIEW COMPARISON:  11/03/2016, 08/04/2019 FINDINGS: Heterogeneous airspace disease in the right greater than left lower lobe suspicious for pneumonia. Borderline cardiac size. No pneumothorax or pleural effusion IMPRESSION: Heterogeneous airspace disease in the right greater than left lower lobes, suspicious for pneumonia Electronically Signed   By: Jasmine Pang M.D.   On: 11/24/2021 23:56    Procedures Procedures    Medications Ordered in ED Medications  acetaminophen (TYLENOL) tablet 1,000 mg (1,000 mg Oral Given 11/25/21  0019)  doxycycline (VIBRA-TABS) tablet 100 mg (100 mg Oral Given 11/25/21 0407)  benzonatate (TESSALON) capsule 100 mg (100 mg Oral Given 11/25/21 0407)    ED Course/ Medical Decision Making/ A&P                           Medical Decision Making Risk Prescription drug management.   51 year old female here with 1 week of URI type symptoms, waxing and waning in severity.  Febrile on arrival but nontoxic in appearance.  She is in no acute respiratory distress, does have some coarse breath sounds on the right.  COVID screen obtained from triage and is negative.  Chest x-ray with findings of right-sided pneumonia.  Patient remains hemodynamically stable, not requiring any supplemental oxygen.  Will start  on course of doxycycline, Tessalon given for cough.  Encouraged to follow-up with PCP.  Work note provided.  Can return here for any new or acute changes.  Final Clinical Impression(s) / ED Diagnoses Final diagnoses:  Community acquired pneumonia of right lower lobe of lung    Rx / DC Orders ED Discharge Orders          Ordered    doxycycline (VIBRAMYCIN) 100 MG capsule  2 times daily        11/25/21 0420    benzonatate (TESSALON) 100 MG capsule  Every 8 hours        11/25/21 0420              Garlon Hatchet, PA-C 11/25/21 Claria Dice, MD 11/25/21 [phone]

## 2021-11-25 NOTE — ED Notes (Signed)
Pt instructed on fever management. Verbalized understanding

## 2021-12-01 ENCOUNTER — Telehealth: Payer: Self-pay

## 2021-12-01 DIAGNOSIS — I10 Essential (primary) hypertension: Secondary | ICD-10-CM

## 2021-12-01 DIAGNOSIS — R7303 Prediabetes: Secondary | ICD-10-CM

## 2021-12-01 MED ORDER — METFORMIN HCL 500 MG PO TABS: 500.0000 mg | ORAL_TABLET | Freq: Two times a day (BID) | ORAL | 3 refills | Status: DC

## 2021-12-01 NOTE — Telephone Encounter (Signed)
Metformin 

## 2021-12-01 NOTE — Telephone Encounter (Signed)
LVM for patient request confirmation regarding the pharmacy to send it to. I have sent to [facility] on [address]   Rx can be transferred to another pharmacy by having the patient the pharmacy and request the transfer.

## 2021-12-08 ENCOUNTER — Telehealth: Payer: Self-pay

## 2021-12-08 NOTE — Telephone Encounter (Signed)
Furosemide   [facility] on [address]

## 2021-12-09 ENCOUNTER — Telehealth: Payer: Self-pay

## 2021-12-09 DIAGNOSIS — R600 Localized edema: Secondary | ICD-10-CM

## 2021-12-09 DIAGNOSIS — I10 Essential (primary) hypertension: Secondary | ICD-10-CM

## 2021-12-09 MED ORDER — FUROSEMIDE 20 MG PO TABS: 20.0000 mg | ORAL_TABLET | Freq: Every day | ORAL | 11 refills | Status: DC

## 2021-12-09 NOTE — Telephone Encounter (Signed)
No additional notes needed  

## 2021-12-15 ENCOUNTER — Other Ambulatory Visit: Payer: Self-pay

## 2021-12-17 ENCOUNTER — Other Ambulatory Visit: Payer: Self-pay

## 2021-12-21 ENCOUNTER — Ambulatory Visit (INDEPENDENT_AMBULATORY_CARE_PROVIDER_SITE_OTHER): Payer: Self-pay | Admitting: Orthopedic Surgery

## 2021-12-21 DIAGNOSIS — M654 Radial styloid tenosynovitis [de Quervain]: Secondary | ICD-10-CM

## 2021-12-21 MED ORDER — BETAMETHASONE SOD PHOS & ACET 6 (3-3) MG/ML IJ SUSP
6.0000 mg | INTRAMUSCULAR | Status: AC | PRN
  Administered 2021-12-21: 6 mg via INTRA_ARTICULAR

## 2021-12-21 MED ORDER — LIDOCAINE HCL 1 % IJ SOLN
1.0000 mL | INTRAMUSCULAR | Status: AC | PRN
  Administered 2021-12-21: 1 mL

## 2021-12-21 NOTE — Progress Notes (Signed)
Office Visit Note   Patient: Christie Walker           Date of Birth: 09-14-1970           MRN: [id number] Visit Date: 12/21/2021              Requested by: Ivonne Andrew, NP [id number] [address]. [address] [place],  [place] [address] PCP: Ivonne Andrew, NP   Assessment & Plan: Visit Diagnoses:  1. De Quervain's tenosynovitis, right     Plan: Patient presents with continued pain over the right radial styloid with provocative findings consistent with De Quervain's tenosynovitis.  We again reviewed the nature of this condition as well as both conservative and surgical treatment options.  Patient would like to try 1 more corticosteroid junction given the significant symptom relief achieved with the first.  I can see him back again as needed.  Follow-Up Instructions: No follow-ups on file.   Orders:  No orders of the defined types were placed in this encounter.  No orders of the defined types were placed in this encounter.     Procedures: Hand/UE Inj: R extensor compartment 1 for de Quervain's tenosynovitis on 12/21/2021 8:53 AM Indications: tendon swelling and therapeutic Details: 25 G needle, radial approach Medications: 1 mL lidocaine 1 %; 6 mg betamethasone acetate-betamethasone sodium phosphate 6 (3-3) MG/ML Procedure, treatment alternatives, risks and benefits explained, specific risks discussed. Consent was given by the patient. Immediately prior to procedure a time out was called to verify the correct patient, procedure, equipment, support staff and site/side marked as required. Patient was prepped and draped in the usual sterile fashion.       Clinical Data: No additional findings.   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  She was last seen in my office at the end of May at which time  she underwent corticosteroid junction of the first dorsal compartment.  She had significant relief from this injection for at least a month and a half or so.  The pain essentially resolved but then returned.  Her pain is not as bad now as it was when she was initially seen but it remains bothersome for her.    Review of Systems   Objective: Vital Signs: There were no vitals taken for this visit.  Physical Exam  Right Hand Exam   Tenderness  Right hand tenderness location: TTP directly over the radial styloid and first dorsal compartment.  Tests  Finkelstein's test: positive  Other  Erythema: absent Sensation: normal Pulse: present      Specialty Comments:  No specialty comments available.  Imaging: No results found.   PMFS History: Patient Active Problem List   Diagnosis Date Noted   De Quervain's tenosynovitis, right 10/05/2021   Bipolar 2 disorder (HCC)    Type 2 diabetes mellitus without complication, without long-term current use of insulin (HCC)    Obesity, Class III, BMI 40-49.9 (morbid obesity) (HCC)    Pneumonia due to COVID-19 virus 07/31/2019   SIRS (systemic inflammatory response syndrome) (HCC) 07/31/2019   Chest tightness 07/31/2019   Chest pain at rest 08/05/2016   Morbid obesity with BMI of 50.0-59.9, adult (HCC) 08/05/2016   Dyspnea on exertion 08/05/2016   HTN (hypertension) 06/14/2016   Metabolic syndrome 06/14/2016   Chronic knee pain 06/14/2016  OBESITY 03/08/2007   TMJ SYNDROME 03/08/2007   GERD 03/08/2007   Past Medical History:  Diagnosis Date   Acid reflux    Bipolar 1 disorder (HCC)    Essential hypertension 06/14/2016    Family History  Problem Relation Age of Onset   Mental illness Other     No past surgical history on file. Social History   Occupational History   Not on file  Tobacco Use   Smoking status: Never   Smokeless tobacco: Never  Vaping Use   Vaping Use: Never used  Substance and Sexual Activity   Alcohol  use: No   Drug use: No   Sexual activity: Not Currently    Birth control/protection: None

## 2022-01-14 ENCOUNTER — Other Ambulatory Visit: Payer: Self-pay

## 2022-01-19 ENCOUNTER — Other Ambulatory Visit: Payer: Self-pay

## 2022-01-19 ENCOUNTER — Encounter (HOSPITAL_COMMUNITY): Payer: Self-pay | Admitting: Psychiatry

## 2022-01-19 ENCOUNTER — Telehealth (INDEPENDENT_AMBULATORY_CARE_PROVIDER_SITE_OTHER): Payer: No Payment, Other | Admitting: Psychiatry

## 2022-01-19 DIAGNOSIS — F3181 Bipolar II disorder: Secondary | ICD-10-CM | POA: Diagnosis not present

## 2022-01-19 MED ORDER — MELATONIN 5 MG PO TABS: 5.0000 mg | ORAL_TABLET | Freq: Every evening | ORAL | 3 refills | Status: DC | PRN

## 2022-01-19 MED ORDER — LURASIDONE HCL 60 MG PO TABS
60.0000 mg | ORAL_TABLET | Freq: Every day | ORAL | 3 refills | Status: DC
  Filled 2022-01-19: qty 30, fill #0
  Filled 2022-02-15: qty 30, 30d supply, fill #0
  Filled 2022-03-16: qty 30, 30d supply, fill #1
  Filled 2022-04-18: qty 30, 30d supply, fill #2

## 2022-01-19 MED ORDER — GABAPENTIN 100 MG PO CAPS: ORAL_CAPSULE | ORAL | 3 refills | Status: DC

## 2022-01-19 NOTE — Progress Notes (Signed)
BH MD/PA/NP OP Progress Note Virtual Visit via Telephone Note  I connected with Christie Walker on 01/19/22 at  1:30 PM EDT by telephone and verified that I am speaking with the correct person using two identifiers.  Location: Patient: home Provider: Clinic   I discussed the limitations, risks, security and privacy concerns of performing an evaluation and management service by telephone and the availability of in person appointments. I also discussed with the patient that there may be a patient responsible charge related to this service. The patient expressed understanding and agreed to proceed.   I provided 30 minutes of non-face-to-face time during this encounter.     01/19/2022 10:45 AM Eupha Lobb  MRN:  [id number]  Chief Complaint: "Everything is fine I just need refills".   HPI:  51 year old female seen today for follow up psychiatric evaluation. She has a psychiatric history of bipolar disorder and and schizophrenia.  She is currently managed on gabapentin 100 mg twice daily, Melatonin 5 mg nightly, and Latuda 60 mg daily.  She notes her medications are effective in managing her psychiatric conditions.   Today was unable to login virtually so assessment done on the phone.  During exam she was pleasant, cooperative, and engaged in conversation.  She informed Clinical research associate that everything is just fine.  Patient cheerful during exam and laughed throughout exam.  Today she notes that she is just in need of refills.  Patient informed Clinical research associate that she has been staying active taking her brother to the store.  She notes that she, her brother, and her mother recently visited [facility].  She notes that they were not impressed and reports that she will not be going back soon.  Patient notes that her mood is stable and notes that her anxiety and depression are minimal.  Provider conducted a GAD-7 and patient scored a 5.  Provider also conducted PHQ-9 and patient scored a 2.  She endorses  adequate sleep and appetite.  Patient informed Clinical research associate that she is lost a few pounds since her last visit.  Today she denies SI/HI/VAH, mania, or paranoia.      At this time no medication changes made.  Patient will follow-up outpatient counseling for therapy.  No other concerns at this time     Visit Diagnosis:    ICD-10-CM   1. Bipolar 2 disorder (HCC)  F31.81 gabapentin (NEURONTIN) 100 MG capsule    Lurasidone HCl (LATUDA) 60 MG TABS    melatonin 5 MG TABS      Past Psychiatric History:  bipolar disorder and and schizophrenia  Past Medical History:  Past Medical History:  Diagnosis Date   Acid reflux    Bipolar 1 disorder (HCC)    Essential hypertension 06/14/2016   History reviewed. No pertinent surgical history.  Family Psychiatric History: Paternal aunts bipolar disorder and father has mental health conditions but unaware which one.   Family History:  Family History  Problem Relation Age of Onset   Mental illness Other     Social History:  Social History   Socioeconomic History   Marital status: Legally Separated    Spouse name: Not on file   Number of children: Not on file   Years of education: Not on file   Highest education level: Not on file  Occupational History   Not on file  Tobacco Use   Smoking status: Never   Smokeless tobacco: Never  Vaping Use   Vaping Use: Never used  Substance and Sexual Activity  Alcohol use: No   Drug use: No   Sexual activity: Not Currently    Birth control/protection: None  Other Topics Concern   Not on file  Social History Narrative   Not on file   Social Determinants of Health   Financial Resource Strain: Not on file  Food Insecurity: Not on file  Transportation Needs: Not on file  Physical Activity: Not on file  Stress: Not on file  Social Connections: Not on file    Allergies:  Allergies  Allergen Reactions   Bee Venom Swelling    Swelling at site    Amoxicillin Hives and Other (See Comments)     Patient states that she can take this she just has a sensitivity when its taken along with pain medication   Hydrocodone Hives and Other (See Comments)    Sensitivity when taken along with antibiotics like amoxicillin per patient   Insect Extract Other (See Comments)    Spiders - make tongue burn   Propoxyphene N-Acetaminophen Nausea And Vomiting and Rash    ** Darvacet    Metabolic Disorder Labs: Lab Results  Component Value Date   HGBA1C 6.3 (A) 09/24/2021   HGBA1C 6.3 09/24/2021   HGBA1C 6.3 09/24/2021   HGBA1C 6.3 09/24/2021   MPG 139.85 08/06/2019   MPG 128.37 08/01/2019   No results found for: "PROLACTIN" Lab Results  Component Value Date   CHOL 229 (H) 09/24/2021   TRIG 162 (H) 09/24/2021   HDL 46 09/24/2021   CHOLHDL 5.0 (H) 09/24/2021   VLDL 32 (H) 06/14/2016   LDLCALC 154 (H) 09/24/2021   LDLCALC 132 (H) 12/24/2020   Lab Results  Component Value Date   TSH 1.080 10/04/2019   TSH 0.262 (L) 08/06/2019    Therapeutic Level Labs: No results found for: "LITHIUM" No results found for: "VALPROATE" No results found for: "CBMZ"  Current Medications: Current Outpatient Medications  Medication Sig Dispense Refill   acetaminophen (TYLENOL) 325 MG tablet Take 650 mg by mouth every 6 (six) hours as needed for mild pain or headache.     atorvastatin (LIPITOR) 40 MG tablet Take 1 tablet (40 mg total) by mouth daily. (Patient not taking: Reported on 11/25/2021) 90 tablet 3   benzonatate (TESSALON) 100 MG capsule Take 1 capsule (100 mg total) by mouth every 8 (eight) hours. 21 capsule 0   COVID-19 mRNA bivalent vaccine, Moderna, (MODERNA COVID-19 BIVAL BOOSTER) 50 MCG/0.5ML injection Inject into the muscle. (Patient not taking: Reported on 03/26/2021) 0.5 mL 0   doxycycline (VIBRAMYCIN) 100 MG capsule Take 1 capsule (100 mg total) by mouth 2 (two) times daily. 20 capsule 0   fluticasone (FLONASE) 50 MCG/ACT nasal spray Place 2 sprays into both nostrils daily. (Patient taking  differently: Place 2 sprays into both nostrils daily as needed for allergies.) 9.9 g 0   furosemide (LASIX) 20 MG tablet Take 1 tablet (20 mg total) by mouth daily. TAKE 1 TABLET(20 MG) BY MOUTH DAILY 30 tablet 11   gabapentin (NEURONTIN) 100 MG capsule TAKE 1 CAPSULE BY MOUTH TWICE DAILY 60 capsule 3   ibuprofen (ADVIL) 800 MG tablet Take 1 tablet (800 mg total) by mouth every 8 (eight) hours as needed. (Patient taking differently: Take 800 mg by mouth every 8 (eight) hours as needed for moderate pain.) 30 tablet 2   influenza vac split quadrivalent PF (FLUARIX) 0.5 ML injection Inject into the muscle. (Patient not taking: Reported on 03/26/2021) 0.5 mL 0   IRON PO Take 1 tablet  by mouth daily.     Lurasidone HCl (LATUDA) 60 MG TABS TAKE 1 TABLET(60 MG) BY MOUTH ONCE DAILY. 30 tablet 3   melatonin 5 MG TABS Take 1 tablet (5 mg total) by mouth at bedtime as needed. 60 tablet 3   metFORMIN (GLUCOPHAGE) 500 MG tablet Take 1 tablet (500 mg total) by mouth 2 (two) times daily with a meal. 180 tablet 3   No current facility-administered medications for this visit.     Musculoskeletal: Strength & Muscle Tone:  Unable to assess due to telephone visit Gait & Station:  Unable to assess due to telephone visit Patient leans: N/A  Psychiatric Specialty Exam: Review of Systems  There were no vitals taken for this visit.There is no height or weight on file to calculate BMI.  General Appearance:  Unable to assess due to telephone visit  Eye Contact:   Unable to assess due to telephone visit  Speech:  Clear and Coherent and Normal Rate  Volume:  Normal  Mood:  Euthymic  Affect:  Appropriate and Congruent  Thought Process:  Coherent, Goal Directed and Linear  Orientation:  Full (Time, Place, and Person)  Thought Content: WDL and Logical   Suicidal Thoughts:  No  Homicidal Thoughts:  No  Memory:  Immediate;   Good Recent;   Good Remote;   Good  Judgement:  Good  Insight:  Good  Psychomotor  Activity:   Unable to assess due to telephone visit  Concentration:  Concentration: Good and Attention Span: Good  Recall:  Good  Fund of Knowledge: Good  Language: Good  Akathisia:  No  Handed:  Right  AIMS (if indicated): Not done  Assets:  Communication Skills Desire for Improvement Financial Resources/Insurance Housing Social Support  ADL's:  Intact  Cognition: WNL  Sleep:  Good   Screenings: GAD-7    Flowsheet Row Video Visit from 01/19/2022 in [facility] Video Visit from 05/24/2021 in [facility] Video Visit from 02/19/2021 in [facility] Video Visit from 11/20/2020 in [facility] Video Visit from 08/21/2020 in [facility]  Total GAD-7 Score 5 5 6 5 2       PHQ2-9    Flowsheet Row Video Visit from 01/19/2022 in [facility] Office Visit from 09/24/2021 in [facility] Office Visit from 06/25/2021 in [facility] Video Visit from 05/24/2021 in [facility] Office Visit from 03/26/2021 in [facility]  PHQ-2 Total Score 0 0 0 0 0  PHQ-9 Total Score 2 -- -- 2 --      Flowsheet Row ED from 11/24/2021 in [facility] Video Visit from 08/21/2020 in [facility] ED from 08/12/2020 in [facility]  C-SSRS RISK CATEGORY No Risk No Risk No Risk        Assessment and Plan: Patient informed writer that she i is doing well on her current medication regimen.  No medication changes made today.  Patient agreeable to continue medications as prescribed   1. Bipolar 2 disorder (HCC)  Continue- gabapentin (NEURONTIN) 100 MG capsule; Take 1 capsule (100 mg total) by mouth 2 (two) times daily.  Dispense: 60 capsule; Refill: 3 Continue- lurasidone 60 MG  TABS; Take 1 tablet (60 mg total) by mouth daily.  Dispense: 90 tablet; Refill: 3 Continue- melatonin 5 MG TABS; Take  1 tablet (5 mg total) by mouth at bedtime as needed.  Dispense: 60 tablet; Refill: 3  Follow-up in 3 months  Shanna Cisco, NP  01/19/2022, 10:45 AM

## 2022-02-15 ENCOUNTER — Other Ambulatory Visit: Payer: Self-pay

## 2022-03-16 ENCOUNTER — Other Ambulatory Visit: Payer: Self-pay

## 2022-03-18 ENCOUNTER — Other Ambulatory Visit: Payer: Self-pay

## 2022-04-08 ENCOUNTER — Ambulatory Visit: Payer: Self-pay | Admitting: Nurse Practitioner

## 2022-04-18 ENCOUNTER — Other Ambulatory Visit: Payer: Self-pay

## 2022-04-22 ENCOUNTER — Encounter (HOSPITAL_COMMUNITY): Payer: Self-pay | Admitting: Psychiatry

## 2022-04-22 ENCOUNTER — Other Ambulatory Visit: Payer: Self-pay

## 2022-04-22 ENCOUNTER — Telehealth (INDEPENDENT_AMBULATORY_CARE_PROVIDER_SITE_OTHER): Payer: No Payment, Other | Admitting: Psychiatry

## 2022-04-22 DIAGNOSIS — F3181 Bipolar II disorder: Secondary | ICD-10-CM

## 2022-04-22 MED ORDER — GABAPENTIN 100 MG PO CAPS: ORAL_CAPSULE | ORAL | 3 refills | Status: DC

## 2022-04-22 MED ORDER — MELATONIN 5 MG PO TABS: 5.0000 mg | ORAL_TABLET | Freq: Every evening | ORAL | 3 refills | Status: DC | PRN

## 2022-04-22 MED ORDER — LURASIDONE HCL 60 MG PO TABS
60.0000 mg | ORAL_TABLET | Freq: Every day | ORAL | 3 refills | Status: DC
  Filled 2022-05-20 (×2): qty 30, 30d supply, fill #0
  Filled 2022-06-17 – 2022-06-24 (×3): qty 30, 30d supply, fill #1
  Filled 2022-07-20 (×2): qty 30, 30d supply, fill #2

## 2022-04-22 NOTE — Progress Notes (Signed)
BH MD/PA/NP OP Progress Note Virtual Visit via Video Note  I connected with Christie Walker on 04/22/22 at 11:00 AM EST by a video enabled telemedicine application and verified that I am speaking with the correct person using two identifiers.  Location: Patient: Home Provider: Clinic   I discussed the limitations of evaluation and management by telemedicine and the availability of in person appointments. The patient expressed understanding and agreed to proceed.  I provided 30 minutes of non-face-to-face time during this encounter.       04/22/2022 10:50 AM Christie Walker  MRN:  [id number]  Chief Complaint: "I have a sharp pain in my left leg at times".   HPI:  51 year old female seen today for follow up psychiatric evaluation. She has a psychiatric history of bipolar disorder and and schizophrenia.  She is currently managed on gabapentin 100 mg twice daily, Melatonin 5 mg nightly, and Latuda 60 mg daily.  She notes her medications are effective in managing her psychiatric conditions.   Today was well-groomed, pleasant, cooperative, and engaged in conversation.  She informed Clinical research associate that recently she has been having sharp pains in her left leg.  She notes that it does not radiate and denies one-sided weakness.  She did Archivist that she has an appointment with her PCP in January to address this.  Overall patient notes that her mood is stable and reports that she has minimal anxiety and depression.  Provider conducted a GAD-7 and patient scored a 1, at her last visit she scored a 5.  Provider also conducted a PHQ-9 and patient scored a 1, at her last visit she scored a 2.  She endorses adequate sleep and appetite.  Today she denies SI/HI/VAH, mania, paranoia.    No medication changes made today.  Patient agreeable to continue medication as prescribed and will follow-up outpatient counseling for therapy.  No other concerns at this time     Visit Diagnosis:    ICD-10-CM    1. Bipolar 2 disorder (HCC)  F31.81 melatonin 5 MG TABS    Lurasidone HCl (LATUDA) 60 MG TABS    gabapentin (NEURONTIN) 100 MG capsule      Past Psychiatric History:  bipolar disorder and and schizophrenia  Past Medical History:  Past Medical History:  Diagnosis Date   Acid reflux    Bipolar 1 disorder (HCC)    Essential hypertension 06/14/2016   History reviewed. No pertinent surgical history.  Family Psychiatric History: Paternal aunts bipolar disorder and father has mental health conditions but unaware which one.   Family History:  Family History  Problem Relation Age of Onset   Mental illness Other     Social History:  Social History   Socioeconomic History   Marital status: Legally Separated    Spouse name: Not on file   Number of children: Not on file   Years of education: Not on file   Highest education level: Not on file  Occupational History   Not on file  Tobacco Use   Smoking status: Never   Smokeless tobacco: Never  Vaping Use   Vaping Use: Never used  Substance and Sexual Activity   Alcohol use: No   Drug use: No   Sexual activity: Not Currently    Birth control/protection: None  Other Topics Concern   Not on file  Social History Narrative   Not on file   Social Determinants of Health   Financial Resource Strain: Not on file  Food Insecurity: Not on  file  Transportation Needs: Not on file  Physical Activity: Not on file  Stress: Not on file  Social Connections: Not on file    Allergies:  Allergies  Allergen Reactions   Bee Venom Swelling    Swelling at site    Amoxicillin Hives and Other (See Comments)    Patient states that she can take this she just has a sensitivity when its taken along with pain medication   Hydrocodone Hives and Other (See Comments)    Sensitivity when taken along with antibiotics like amoxicillin per patient   Insect Extract Other (See Comments)    Spiders - make tongue burn   Propoxyphene N-Acetaminophen  Nausea And Vomiting and Rash    ** Darvacet    Metabolic Disorder Labs: Lab Results  Component Value Date   HGBA1C 6.3 (A) 09/24/2021   HGBA1C 6.3 09/24/2021   HGBA1C 6.3 09/24/2021   HGBA1C 6.3 09/24/2021   MPG 139.85 08/06/2019   MPG 128.37 08/01/2019   No results found for: "PROLACTIN" Lab Results  Component Value Date   CHOL 229 (H) 09/24/2021   TRIG 162 (H) 09/24/2021   HDL 46 09/24/2021   CHOLHDL 5.0 (H) 09/24/2021   VLDL 32 (H) 06/14/2016   LDLCALC 154 (H) 09/24/2021   LDLCALC 132 (H) 12/24/2020   Lab Results  Component Value Date   TSH 1.080 10/04/2019   TSH 0.262 (L) 08/06/2019    Therapeutic Level Labs: No results found for: "LITHIUM" No results found for: "VALPROATE" No results found for: "CBMZ"  Current Medications: Current Outpatient Medications  Medication Sig Dispense Refill   acetaminophen (TYLENOL) 325 MG tablet Take 650 mg by mouth every 6 (six) hours as needed for mild pain or headache.     atorvastatin (LIPITOR) 40 MG tablet Take 1 tablet (40 mg total) by mouth daily. (Patient not taking: Reported on 11/25/2021) 90 tablet 3   benzonatate (TESSALON) 100 MG capsule Take 1 capsule (100 mg total) by mouth every 8 (eight) hours. 21 capsule 0   COVID-19 mRNA bivalent vaccine, Moderna, (MODERNA COVID-19 BIVAL BOOSTER) 50 MCG/0.5ML injection Inject into the muscle. (Patient not taking: Reported on 03/26/2021) 0.5 mL 0   doxycycline (VIBRAMYCIN) 100 MG capsule Take 1 capsule (100 mg total) by mouth 2 (two) times daily. 20 capsule 0   fluticasone (FLONASE) 50 MCG/ACT nasal spray Place 2 sprays into both nostrils daily. (Patient taking differently: Place 2 sprays into both nostrils daily as needed for allergies.) 9.9 g 0   furosemide (LASIX) 20 MG tablet Take 1 tablet (20 mg total) by mouth daily. TAKE 1 TABLET(20 MG) BY MOUTH DAILY 30 tablet 11   gabapentin (NEURONTIN) 100 MG capsule TAKE 1 CAPSULE BY MOUTH TWICE DAILY 60 capsule 3   ibuprofen (ADVIL) 800 MG  tablet Take 1 tablet (800 mg total) by mouth every 8 (eight) hours as needed. (Patient taking differently: Take 800 mg by mouth every 8 (eight) hours as needed for moderate pain.) 30 tablet 2   influenza vac split quadrivalent PF (FLUARIX) 0.5 ML injection Inject into the muscle. (Patient not taking: Reported on 03/26/2021) 0.5 mL 0   IRON PO Take 1 tablet by mouth daily.     Lurasidone HCl (LATUDA) 60 MG TABS Take 1 tablet (60 mg total) by mouth daily. 30 tablet 3   melatonin 5 MG TABS Take 1 tablet (5 mg total) by mouth at bedtime as needed. 60 tablet 3   metFORMIN (GLUCOPHAGE) 500 MG tablet Take 1 tablet (500  mg total) by mouth 2 (two) times daily with a meal. 180 tablet 3   No current facility-administered medications for this visit.     Musculoskeletal: Strength & Muscle Tone: within normal limits Gait & Station: normal Patient leans: N/A  Psychiatric Specialty Exam: Review of Systems  There were no vitals taken for this visit.There is no height or weight on file to calculate BMI.  General Appearance: Well Groomed  Eye Contact:  Good  Speech:  Clear and Coherent and Normal Rate  Volume:  Normal  Mood:  Euthymic  Affect:  Appropriate and Congruent  Thought Process:  Coherent, Goal Directed and Linear  Orientation:  Full (Time, Place, and Person)  Thought Content: WDL and Logical following  Suicidal Thoughts:  No  Homicidal Thoughts:  No  Memory:  Immediate;   Good Recent;   Good Remote;   Good  Judgement:  Good  Insight:  Good  Psychomotor Activity:  Normal  Concentration:  Concentration: Good and Attention Span: Good  Recall:  Good  Fund of Knowledge: Good  Language: Good  Akathisia:  No  Handed:  Right  AIMS (if indicated): Not done  Assets:  Communication Skills Desire for Improvement Financial Resources/Insurance Housing Social Support  ADL's:  Intact  Cognition: WNL  Sleep:  Good   Screenings: GAD-7    Flowsheet Row Video Visit from 04/22/2022 in  [facility] Video Visit from 01/19/2022 in [facility] Video Visit from 05/24/2021 in [facility] Video Visit from 02/19/2021 in [facility] Video Visit from 11/20/2020 in [facility]  Total GAD-7 Score 1 5 5 6 5       PHQ2-9    Flowsheet Row Video Visit from 04/22/2022 in [facility] Video Visit from 01/19/2022 in [facility] Office Visit from 09/24/2021 in [facility] Office Visit from 06/25/2021 in [facility] Video Visit from 05/24/2021 in [facility]  PHQ-2 Total Score 0 0 0 0 0  PHQ-9 Total Score 1 2 -- -- 2      Flowsheet Row ED from 11/24/2021 in [facility] Video Visit from 08/21/2020 in [facility] ED from 08/12/2020 in [facility]  C-SSRS RISK CATEGORY No Risk No Risk No Risk        Assessment and Plan: Patient informed writer that she i is doing well on her current medication regimen.  No medication changes made today.  Patient agreeable to continue medications as prescribed   1. Bipolar 2 disorder (HCC)  Continue- gabapentin (NEURONTIN) 100 MG capsule; Take 1 capsule (100 mg total) by mouth 2 (two) times daily.  Dispense: 60 capsule; Refill: 3 Continue- lurasidone 60 MG TABS; Take 1 tablet (60 mg total) by mouth daily.  Dispense: 90 tablet; Refill: 3 Continue- melatonin 5 MG TABS; Take 1 tablet (5 mg total) by mouth at bedtime as needed.  Dispense: 60 tablet; Refill: 3  Follow-up in 3 months  10/12/2020, NP  04/22/2022, 10:50 AM

## 2022-04-26 ENCOUNTER — Emergency Department (HOSPITAL_COMMUNITY): Payer: Self-pay

## 2022-04-26 ENCOUNTER — Other Ambulatory Visit: Payer: Self-pay

## 2022-04-26 ENCOUNTER — Emergency Department (HOSPITAL_COMMUNITY)
Admission: EM | Admit: 2022-04-26 | Discharge: 2022-04-26 | Disposition: A | Discharge status: Home / Self Care | Payer: Self-pay | Attending: Emergency Medicine | Admitting: Emergency Medicine

## 2022-04-26 DIAGNOSIS — J101 Influenza due to other identified influenza virus with other respiratory manifestations: Secondary | ICD-10-CM | POA: Insufficient documentation

## 2022-04-26 DIAGNOSIS — R051 Acute cough: Secondary | ICD-10-CM

## 2022-04-26 DIAGNOSIS — Z79899 Other long term (current) drug therapy: Secondary | ICD-10-CM | POA: Insufficient documentation

## 2022-04-26 DIAGNOSIS — Z1152 Encounter for screening for COVID-19: Secondary | ICD-10-CM | POA: Insufficient documentation

## 2022-04-26 DIAGNOSIS — I10 Essential (primary) hypertension: Secondary | ICD-10-CM | POA: Insufficient documentation

## 2022-04-26 DIAGNOSIS — E119 Type 2 diabetes mellitus without complications: Secondary | ICD-10-CM | POA: Insufficient documentation

## 2022-04-26 LAB — RESP PANEL BY RT-PCR (RSV, FLU A&B, COVID)  RVPGX2
Influenza A by PCR: POSITIVE — AB
Influenza B by PCR: NEGATIVE
Resp Syncytial Virus by PCR: NEGATIVE
SARS Coronavirus 2 by RT PCR: NEGATIVE

## 2022-04-26 MED ORDER — IBUPROFEN 200 MG PO TABS
600.0000 mg | ORAL_TABLET | Freq: Once | ORAL | Status: AC
  Administered 2022-04-26: 600 mg via ORAL
  Filled 2022-04-26: qty 3

## 2022-04-26 MED ORDER — OSELTAMIVIR PHOSPHATE 75 MG PO CAPS
75.0000 mg | ORAL_CAPSULE | Freq: Once | ORAL | Status: AC
  Administered 2022-04-26: 75 mg via ORAL

## 2022-04-26 MED ORDER — ACETAMINOPHEN 325 MG PO TABS
650.0000 mg | ORAL_TABLET | Freq: Once | ORAL | Status: AC | PRN
  Administered 2022-04-26: 650 mg via ORAL
  Filled 2022-04-26: qty 2

## 2022-04-26 MED ORDER — OSELTAMIVIR PHOSPHATE 75 MG PO CAPS: 75.0000 mg | ORAL_CAPSULE | Freq: Two times a day (BID) | ORAL | 0 refills | Status: DC

## 2022-04-26 MED ORDER — OSELTAMIVIR PHOSPHATE 75 MG PO CAPS
ORAL_CAPSULE | ORAL | Status: AC
  Filled 2022-04-26: qty 1

## 2022-04-26 MED ORDER — ONDANSETRON 4 MG PO TBDP
4.0000 mg | ORAL_TABLET | Freq: Once | ORAL | Status: AC
  Administered 2022-04-26: 4 mg via ORAL
  Filled 2022-04-26: qty 1

## 2022-04-26 NOTE — ED Notes (Signed)
Pt declined vitals other than temp recheck. States that she did not need them rechecked when asked.

## 2022-04-26 NOTE — ED Provider Notes (Signed)
[facility]-EMERGENCY DEPT Provider Note   CSN: [id number] Arrival date & time: 04/26/22  0856     History  Chief Complaint  Patient presents with   Cough    Christie Walker is a 51 y.o. female with history of hypertension, GERD, bipolar disorder, diabetes who presents the emergency department complaining of cough, congestion, body aches and increased work of breathing starting yesterday.  Had 1 episode of vomiting this morning.  Believes she was recently exposed to people around her who are sick.  States the cough is painful.  Unsure if she has had a fever.   Cough Associated symptoms: fever, myalgias and shortness of breath   Associated symptoms: no chest pain and no wheezing        Home Medications Prior to Admission medications   Medication Sig Start Date End Date Taking? Authorizing Provider  oseltamivir (TAMIFLU) 75 MG capsule Take 1 capsule (75 mg total) by mouth every 12 (twelve) hours. 04/26/22  Yes Aracelis Ulrey T, PA-C  acetaminophen (TYLENOL) 325 MG tablet Take 650 mg by mouth every 6 (six) hours as needed for mild pain or headache.    [provider]  atorvastatin (LIPITOR) 40 MG tablet Take 1 tablet (40 mg total) by mouth daily. Patient not taking: Reported on 11/25/2021 10/05/21   Orion Crook I, NP  benzonatate (TESSALON) 100 MG capsule Take 1 capsule (100 mg total) by mouth every 8 (eight) hours. 11/25/21   Garlon Hatchet, PA-C  COVID-19 mRNA bivalent vaccine, Moderna, (MODERNA COVID-19 BIVAL BOOSTER) 50 MCG/0.5ML injection Inject into the muscle. Patient not taking: Reported on 03/26/2021 02/23/21   Judyann Munson, MD  doxycycline (VIBRAMYCIN) 100 MG capsule Take 1 capsule (100 mg total) by mouth 2 (two) times daily. 11/25/21   Garlon Hatchet, PA-C  fluticasone (FLONASE) 50 MCG/ACT nasal spray Place 2 sprays into both nostrils daily. Patient taking differently: Place 2 sprays into both nostrils daily as needed for  allergies. 08/14/19   Rodolph Bong, MD  furosemide (LASIX) 20 MG tablet Take 1 tablet (20 mg total) by mouth daily. TAKE 1 TABLET(20 MG) BY MOUTH DAILY 12/09/21 12/09/22  Ivonne Andrew, NP  gabapentin (NEURONTIN) 100 MG capsule TAKE 1 CAPSULE BY MOUTH TWICE DAILY 04/22/22   Toy Cookey E, NP  ibuprofen (ADVIL) 800 MG tablet Take 1 tablet (800 mg total) by mouth every 8 (eight) hours as needed. Patient taking differently: Take 800 mg by mouth every 8 (eight) hours as needed for moderate pain. 07/30/21   Barbette Merino, NP  influenza vac split quadrivalent PF (FLUARIX) 0.5 ML injection Inject into the muscle. Patient not taking: Reported on 03/26/2021 02/23/21   Judyann Munson, MD  IRON PO Take 1 tablet by mouth daily.    [provider]  Lurasidone HCl (LATUDA) 60 MG TABS Take 1 tablet (60 mg total) by mouth daily. 04/22/22   Toy Cookey E, NP  melatonin 5 MG TABS Take 1 tablet (5 mg total) by mouth at bedtime as needed. 04/22/22   Shanna Cisco, NP  metFORMIN (GLUCOPHAGE) 500 MG tablet Take 1 tablet (500 mg total) by mouth 2 (two) times daily with a meal. 12/01/21 12/01/22  Ivonne Andrew, NP      Allergies    Bee venom, Amoxicillin, Hydrocodone, Insect extract, and Propoxyphene n-acetaminophen    Review of Systems   Review of Systems  Constitutional:  Positive for fever.  HENT:  Positive for congestion.   Respiratory:  Positive for cough and shortness of breath. Negative for wheezing.   Cardiovascular:  Negative for chest pain.  Gastrointestinal:  Negative for diarrhea, nausea and vomiting.  Musculoskeletal:  Positive for myalgias.  All other systems reviewed and are negative.   Physical Exam Updated Vital Signs BP 138/89   Pulse (!) 118   Temp (!) 101.1 F (38.4 C) (Oral)   Resp 20   SpO2 93%  Physical Exam Vitals and nursing note reviewed.  Constitutional:      Appearance: Normal appearance.  HENT:     Head: Normocephalic and atraumatic.      Nose: Congestion present.  Eyes:     Conjunctiva/sclera: Conjunctivae normal.  Cardiovascular:     Rate and Rhythm: Normal rate and regular rhythm.  Pulmonary:     Effort: Pulmonary effort is normal. No respiratory distress.     Breath sounds: Normal breath sounds.  Abdominal:     General: There is no distension.     Palpations: Abdomen is soft.     Tenderness: There is no abdominal tenderness.  Skin:    General: Skin is warm and dry.  Neurological:     General: No focal deficit present.     Mental Status: She is alert.     ED Results / Procedures / Treatments   Labs (all labs ordered are listed, but only abnormal results are displayed) Labs Reviewed  RESP PANEL BY RT-PCR (RSV, FLU A&B, COVID)  RVPGX2 - Abnormal; Notable for the following components:      Result Value   Influenza A by PCR POSITIVE (*)    All other components within normal limits    EKG None  Radiology DG Chest 2 View  Result Date: 04/26/2022 CLINICAL DATA:  cough EXAM: CHEST - 2 VIEW COMPARISON:  11/24/2021 FINDINGS: The heart size and mediastinal contours are within normal limits. There is diffuse pulmonary interstitial prominence which could be seen with atypical infection, edema or interstitial lung disease. There is no focal consolidation. The visualized skeletal structures are unremarkable. IMPRESSION: Nonspecific interstitial prominence consistent with atypical infection, edema or interstitial lung disease. No focal consolidation. Electronically Signed   By: Layla Maw M.D.   On: 04/26/2022 19:03    Procedures Procedures    Medications Ordered in ED Medications  oseltamivir (TAMIFLU) capsule 75 mg (has no administration in time range)  acetaminophen (TYLENOL) tablet 650 mg (650 mg Oral Given 04/26/22 1708)  ondansetron (ZOFRAN-ODT) disintegrating tablet 4 mg (4 mg Oral Given 04/26/22 2129)  ibuprofen (ADVIL) tablet 600 mg (600 mg Oral Given 04/26/22 2129)    ED Course/ Medical Decision  Making/ A&P                           Medical Decision Making Risk OTC drugs.   This patient is a 51 y.o. female who presents to the ED for concern of cough, shortness of breath, body aches x 1 day.   Differential diagnoses prior to evaluation: The emergent differential diagnosis includes, but is not limited to,  upper respiratory infection, lower respiratory infection, allergies, asthma, irritants, sinus/esophageal foreign body, medications, reflux, interstitial lung disease, postnasal drip, viral illness, sepsis. This is not an exhaustive differential.   Past Medical History / Co-morbidities: hypertension, GERD, bipolar disorder, diabetes  Physical Exam: Physical exam performed. The pertinent findings include: Febrile to 101.77F, given Tylenol and Motrin.  Tachycardic to 118, suspect due to fever.  Nasal congestion.  No significant increased work  of breathing, normal oxygen saturation on room air, lung sounds clear.  Lab Tests/Imaging studies: I personally interpreted labs/imaging and the pertinent results include:  respiratory panel positive for influenza A.   X-ray with nonspecific interstitial prominence, no consolidation.  I agree with the radiologist interpretation.  Cardiac monitoring: EKG obtained and interpreted by my attending physician which shows: Sinus tachycardia   Medications: I ordered medication including Tylenol, Advil, Zofran, Tamiflu.  I have reviewed the patients home medicines and have made adjustments as needed.   Disposition: After consideration of the diagnostic results and the patients response to treatment, I feel that emergency department workup does not suggest an emergent condition requiring admission or immediate intervention beyond what has been performed at this time. Patient with symptoms consistent with influenza.  Vitals are stable, low-grade fever.  No signs of dehydration, tolerating PO's.  Lungs are clear.   The plan is: Patient will be  discharged with instructions to orally hydrate, rest, and use over-the-counter medications such as anti-inflammatories such as ibuprofen and Tylenol for fever.   Prescribed tamiflu. . The patient is safe for discharge and has been instructed to return immediately for worsening symptoms, change in symptoms or any other concerns.  Final Clinical Impression(s) / ED Diagnoses Final diagnoses:  Influenza A  Acute cough    Rx / DC Orders ED Discharge Orders          Ordered    oseltamivir (TAMIFLU) 75 MG capsule  Every 12 hours        04/26/22 2147           Portions of this report may have been transcribed using voice recognition software. Every effort was made to ensure accuracy; however, inadvertent computerized transcription errors may be present.    Jeanella Flattery 04/26/22 2152    Derwood Kaplan, MD 04/27/22 2244

## 2022-04-26 NOTE — ED Triage Notes (Signed)
Pt via POV c/o severe chest congestion, cough, and increased work of breathing since yesterday with 1 episode of vomiting this morning. Recently exposed to sick contacts in public, unsure of dx. Pt reports painful cough, no other symptoms noted.

## 2022-04-26 NOTE — ED Notes (Signed)
Triage RN aware of temp

## 2022-04-26 NOTE — Discharge Instructions (Addendum)
You were seen in the emergency department today for cough.  As we discussed your influenza A test is positive.  This is a viral illness very common at this time of year, and we normally treat with over-the-counter medications.  Symptoms can last for up to a week.  You can take ibuprofen or Tylenol for pain or fever, and I recommend alternating between the 2.  Make sure that you are drinking lots of fluids and getting plenty of rest. You can take decongestants as long as you take them with lots of water. You can use lozenges or chloraseptic spray as needed for sore throat.   Please use Tylenol or ibuprofen for pain.  You may use 600 mg ibuprofen every 6 hours or 1000 mg of Tylenol every 6 hours.  You may choose to alternate between the 2.  This would be most effective.  Do not exceed 4 g of Tylenol within 24 hours.  Do not exceed 3200 mg ibuprofen within 24 hours.  I have sent the anti-viral medication to your pharmacy.   Continue to monitor how you are doing, and return to the emergency department for new or worsening symptoms such as chest pain, difficulty breathing not related to coughing, fever despite medication, or persistent vomiting or diarrhea.  It has been a pleasure taking care of you today and I hope you begin to feel better soon!

## 2022-04-28 ENCOUNTER — Telehealth: Payer: Self-pay

## 2022-04-28 ENCOUNTER — Telehealth: Payer: Self-pay | Admitting: Nurse Practitioner

## 2022-04-28 NOTE — Telephone Encounter (Signed)
Done KH 

## 2022-04-28 NOTE — Telephone Encounter (Signed)
Please call this pt for lab results.. She just called

## 2022-04-28 NOTE — Telephone Encounter (Signed)
Transition Care Management Follow-up Telephone Call Date of discharge and from where: 04/26/22 How have you been since you were released from the hospital? Per pt she has not not improved Any questions or concerns? No  Items Reviewed: Did the pt receive and understand the discharge instructions provided? Yes  Medications obtained and verified? No Other? No  Any new allergies since your discharge? No  Dietary orders reviewed? No Do you have support at home? Yes    Follow up appointments reviewed:  PCP Hospital f/u appt confirmed? Yes  Scheduled to see Tonya  on 05/12/22 @ 8:20 am. [facility] f/u appt confirmed? No   Are transportation arrangements needed? No  If their condition worsens, is the pt aware to call PCP or go to the Emergency Dept.? Yes Was the patient provided with contact information for the PCP's office or ED? Yes Was to pt encouraged to call back with questions or concerns? Yes   Renelda Loma RMA

## 2022-04-28 NOTE — Telephone Encounter (Signed)
Transition Care Management Unsuccessful Follow-up Telephone Call  Date of discharge and from where:  04/26/22  Attempts:  1st Attempt  Reason for unsuccessful TCM follow-up call:  Left voice message   Renelda Loma RMA

## 2022-05-12 ENCOUNTER — Ambulatory Visit (INDEPENDENT_AMBULATORY_CARE_PROVIDER_SITE_OTHER): Payer: Self-pay | Admitting: Nurse Practitioner

## 2022-05-12 ENCOUNTER — Encounter: Payer: Self-pay | Admitting: Nurse Practitioner

## 2022-05-12 VITALS — BP 123/66 | HR 84 | Ht 65.0 in | Wt 343.0 lb

## 2022-05-12 DIAGNOSIS — Z1231 Encounter for screening mammogram for malignant neoplasm of breast: Secondary | ICD-10-CM

## 2022-05-12 DIAGNOSIS — R600 Localized edema: Secondary | ICD-10-CM

## 2022-05-12 DIAGNOSIS — E782 Mixed hyperlipidemia: Secondary | ICD-10-CM

## 2022-05-12 DIAGNOSIS — R7303 Prediabetes: Secondary | ICD-10-CM

## 2022-05-12 DIAGNOSIS — I10 Essential (primary) hypertension: Secondary | ICD-10-CM

## 2022-05-12 DIAGNOSIS — E119 Type 2 diabetes mellitus without complications: Secondary | ICD-10-CM

## 2022-05-12 DIAGNOSIS — Z1211 Encounter for screening for malignant neoplasm of colon: Secondary | ICD-10-CM

## 2022-05-12 DIAGNOSIS — R051 Acute cough: Secondary | ICD-10-CM

## 2022-05-12 LAB — POCT GLYCOSYLATED HEMOGLOBIN (HGB A1C): Hemoglobin A1C: 6.5 % — AB (ref 4.0–5.6)

## 2022-05-12 MED ORDER — METFORMIN HCL 500 MG PO TABS: 500.0000 mg | ORAL_TABLET | Freq: Two times a day (BID) | ORAL | 3 refills | Status: DC

## 2022-05-12 MED ORDER — PREDNISONE 20 MG PO TABS: 20.0000 mg | ORAL_TABLET | Freq: Every day | ORAL | 0 refills | Status: AC

## 2022-05-12 MED ORDER — BENZONATATE 200 MG PO CAPS: 200.0000 mg | ORAL_CAPSULE | Freq: Two times a day (BID) | ORAL | 0 refills | Status: DC | PRN

## 2022-05-12 MED ORDER — FUROSEMIDE 20 MG PO TABS: 20.0000 mg | ORAL_TABLET | Freq: Every day | ORAL | 11 refills | Status: DC

## 2022-05-12 MED ORDER — ATORVASTATIN CALCIUM 40 MG PO TABS: 40.0000 mg | ORAL_TABLET | Freq: Every day | ORAL | 3 refills | Status: DC

## 2022-05-12 NOTE — Assessment & Plan Note (Signed)
-   Ambulatory referral to Ophthalmology - POCT glycosylated hemoglobin (Hb A1C) - metFORMIN (GLUCOPHAGE) 500 MG tablet; Take 1 tablet (500 mg total) by mouth 2 (two) times daily with a meal.  Dispense: 180 tablet; Refill: 3  2. Essential hypertension  - metFORMIN (GLUCOPHAGE) 500 MG tablet; Take 1 tablet (500 mg total) by mouth 2 (two) times daily with a meal.  Dispense: 180 tablet; Refill: 3 - furosemide (LASIX) 20 MG tablet; Take 1 tablet (20 mg total) by mouth daily. TAKE 1 TABLET(20 MG) BY MOUTH DAILY  Dispense: 30 tablet; Refill: 11  3. Mixed hyperlipidemia  - atorvastatin (LIPITOR) 40 MG tablet; Take 1 tablet (40 mg total) by mouth daily.  Dispense: 90 tablet; Refill: 3  4. Bilateral lower extremity edema  - furosemide (LASIX) 20 MG tablet; Take 1 tablet (20 mg total) by mouth daily. TAKE 1 TABLET(20 MG) BY MOUTH DAILY  Dispense: 30 tablet; Refill: 11  5. Encounter for screening mammogram for malignant neoplasm of breast  - MM Digital Screening; Future  6. Colon cancer screening  - Cologuard  7. Acute cough  - predniSONE (DELTASONE) 20 MG tablet; Take 1 tablet (20 mg total) by mouth daily with breakfast for 5 days.  Dispense: 5 tablet; Refill: 0 - benzonatate (TESSALON) 200 MG capsule; Take 1 capsule (200 mg total) by mouth 2 (two) times daily as needed for cough.  Dispense: 20 capsule; Refill: 0  Follow up:  Follow up in 6 months or sooner if needed

## 2022-05-12 NOTE — Patient Instructions (Signed)
1. Prediabetes  - Ambulatory referral to Ophthalmology - POCT glycosylated hemoglobin (Hb A1C) - metFORMIN (GLUCOPHAGE) 500 MG tablet; Take 1 tablet (500 mg total) by mouth 2 (two) times daily with a meal.  Dispense: 180 tablet; Refill: 3  2. Essential hypertension  - metFORMIN (GLUCOPHAGE) 500 MG tablet; Take 1 tablet (500 mg total) by mouth 2 (two) times daily with a meal.  Dispense: 180 tablet; Refill: 3 - furosemide (LASIX) 20 MG tablet; Take 1 tablet (20 mg total) by mouth daily. TAKE 1 TABLET(20 MG) BY MOUTH DAILY  Dispense: 30 tablet; Refill: 11  3. Mixed hyperlipidemia  - atorvastatin (LIPITOR) 40 MG tablet; Take 1 tablet (40 mg total) by mouth daily.  Dispense: 90 tablet; Refill: 3  4. Bilateral lower extremity edema  - furosemide (LASIX) 20 MG tablet; Take 1 tablet (20 mg total) by mouth daily. TAKE 1 TABLET(20 MG) BY MOUTH DAILY  Dispense: 30 tablet; Refill: 11  5. Encounter for screening mammogram for malignant neoplasm of breast  - MM Digital Screening; Future  6. Colon cancer screening  - Cologuard  7. Acute cough  - predniSONE (DELTASONE) 20 MG tablet; Take 1 tablet (20 mg total) by mouth daily with breakfast for 5 days.  Dispense: 5 tablet; Refill: 0 - benzonatate (TESSALON) 200 MG capsule; Take 1 capsule (200 mg total) by mouth 2 (two) times daily as needed for cough.  Dispense: 20 capsule; Refill: 0  Follow up:  Follow up in 6 months or sooner if needed

## 2022-05-12 NOTE — Progress Notes (Signed)
@Patient  ID: Christie Walker, female    DOB: [birth year], 52 y.o.   MRN: [id number]  Chief Complaint  Patient presents with   Follow-up   Hospitalization Follow-up    Pt stated--went to ED for flu--but still having cough w/ clear mucus, headache--3 weeks.    Referring provider: Vevelyn Francois, NP   HPI  Christie Walker 52 y.o. female  has a past medical history of Acid reflux, Bipolar 1 disorder ([place]), and Essential hypertension (06/14/2016), diabetes.   Patient presents today for a follow-up visit.  She is compliant with medications.  Her A1c in office today was 6.5.  She does need refills on medications today.  Patient was seen in the ED about 2 weeks ago and diagnosed with flu.  She was given a round of Tamiflu.  Patient states that she still has cough which is improving but lingering and reports associated headache with cough.  Denies any recent fevers.  She has taken OTC Mucinex. Denies f/c/s, n/v/d, hemoptysis, PND, leg swelling Denies chest pain or edema      Allergies  Allergen Reactions   Bee Venom Swelling    Swelling at site    Amoxicillin Hives and Other (See Comments)    Patient states that she can take this she just has a sensitivity when its taken along with pain medication   Hydrocodone Hives and Other (See Comments)    Sensitivity when taken along with antibiotics like amoxicillin per patient   Insect Extract Other (See Comments)    Spiders - make tongue burn   Propoxyphene N-Acetaminophen Nausea And Vomiting and Rash    ** Darvacet    Immunization History  Administered Date(s) Administered   Moderna Covid-19 Vaccine Bivalent Booster 27yrs & up 02/23/2021   Tdap 08/04/2016    Past Medical History:  Diagnosis Date   Acid reflux    Bipolar 1 disorder ([place])    Essential hypertension 06/14/2016    Tobacco History: Social History   Tobacco Use  Smoking Status Never  Smokeless Tobacco Never   Counseling given: Not  Answered   Outpatient Encounter Medications as of 05/12/2022  Medication Sig   benzonatate (TESSALON) 200 MG capsule Take 1 capsule (200 mg total) by mouth 2 (two) times daily as needed for cough.   fluticasone (FLONASE) 50 MCG/ACT nasal spray Place 2 sprays into both nostrils daily. (Patient taking differently: Place 2 sprays into both nostrils daily as needed for allergies.)   gabapentin (NEURONTIN) 100 MG capsule TAKE 1 CAPSULE BY MOUTH TWICE DAILY   IRON PO Take 1 tablet by mouth daily.   Lurasidone HCl (LATUDA) 60 MG TABS Take 1 tablet (60 mg total) by mouth daily.   melatonin 5 MG TABS Take 1 tablet (5 mg total) by mouth at bedtime as needed.   predniSONE (DELTASONE) 20 MG tablet Take 1 tablet (20 mg total) by mouth daily with breakfast for 5 days.   [DISCONTINUED] atorvastatin (LIPITOR) 40 MG tablet Take 1 tablet (40 mg total) by mouth daily.   [DISCONTINUED] furosemide (LASIX) 20 MG tablet Take 1 tablet (20 mg total) by mouth daily. TAKE 1 TABLET(20 MG) BY MOUTH DAILY   [DISCONTINUED] metFORMIN (GLUCOPHAGE) 500 MG tablet Take 1 tablet (500 mg total) by mouth 2 (two) times daily with a meal.   acetaminophen (TYLENOL) 325 MG tablet Take 650 mg by mouth every 6 (six) hours as needed for mild pain or headache.   atorvastatin (LIPITOR) 40 MG tablet Take 1 tablet (40  mg total) by mouth daily.   benzonatate (TESSALON) 100 MG capsule Take 1 capsule (100 mg total) by mouth every 8 (eight) hours. (Patient not taking: Reported on 05/12/2022)   COVID-19 mRNA bivalent vaccine, Moderna, (MODERNA COVID-19 BIVAL BOOSTER) 50 MCG/0.5ML injection Inject into the muscle. (Patient not taking: Reported on 03/26/2021)   doxycycline (VIBRAMYCIN) 100 MG capsule Take 1 capsule (100 mg total) by mouth 2 (two) times daily. (Patient not taking: Reported on 05/12/2022)   furosemide (LASIX) 20 MG tablet Take 1 tablet (20 mg total) by mouth daily. TAKE 1 TABLET(20 MG) BY MOUTH DAILY   ibuprofen (ADVIL) 800 MG tablet Take 1  tablet (800 mg total) by mouth every 8 (eight) hours as needed. (Patient taking differently: Take 800 mg by mouth every 8 (eight) hours as needed for moderate pain.)   influenza vac split quadrivalent PF (FLUARIX) 0.5 ML injection Inject into the muscle. (Patient not taking: Reported on 03/26/2021)   metFORMIN (GLUCOPHAGE) 500 MG tablet Take 1 tablet (500 mg total) by mouth 2 (two) times daily with a meal.   oseltamivir (TAMIFLU) 75 MG capsule Take 1 capsule (75 mg total) by mouth every 12 (twelve) hours. (Patient not taking: Reported on 05/12/2022)   No facility-administered encounter medications on file as of 05/12/2022.     Review of Systems  Review of Systems  Constitutional: Negative.   HENT: Negative.    Respiratory:  Positive for cough.   Cardiovascular: Negative.   Gastrointestinal: Negative.   Allergic/Immunologic: Negative.   Neurological: Negative.   Psychiatric/Behavioral: Negative.         Physical Exam  BP 123/66   Pulse 84   Ht 5\' 5"  (1.651 m)   Wt (!) 343 lb (155.6 kg)   SpO2 98%   BMI 57.08 kg/m   Wt Readings from Last 5 Encounters:  05/12/22 (!) 343 lb (155.6 kg)  10/05/21 (!) 345 lb 8 oz (156.7 kg)  09/24/21 (!) 345 lb 4 oz (156.6 kg)  07/22/21 (!) 345 lb 6.4 oz (156.7 kg)  06/25/21 (!) 347 lb 0.2 oz (157.4 kg)     Physical Exam Vitals and nursing note reviewed.  Constitutional:      General: She is not in acute distress.    Appearance: She is well-developed.  Cardiovascular:     Rate and Rhythm: Normal rate and regular rhythm.  Pulmonary:     Effort: Pulmonary effort is normal.     Breath sounds: Normal breath sounds.  Neurological:     Mental Status: She is alert and oriented to person, place, and time.      Lab Results:  CBC    Component Value Date/Time   WBC 5.8 09/24/2021 1104   WBC 13.7 (H) 08/13/2019 0416   RBC 4.33 09/24/2021 1104   RBC 5.04 08/13/2019 0416   HGB 13.1 09/24/2021 1104   HCT 39.5 09/24/2021 1104   PLT 272  09/24/2021 1104   MCV 91 09/24/2021 1104   MCH 30.3 09/24/2021 1104   MCH 29.6 08/13/2019 0416   MCHC 33.2 09/24/2021 1104   MCHC 32.0 08/13/2019 0416   RDW 14.4 09/24/2021 1104   LYMPHSABS 2.3 09/24/2021 1104   MONOABS 0.8 08/09/2019 0422   EOSABS 0.2 09/24/2021 1104   BASOSABS 0.0 09/24/2021 1104    BMET    Component Value Date/Time   NA 149 (H) 09/24/2021 1104   K 4.5 09/24/2021 1104   CL 108 (H) 09/24/2021 1104   CO2 22 09/24/2021 1104   GLUCOSE  109 (H) 09/24/2021 1104   GLUCOSE 131 (H) 08/13/2019 0416   BUN 10 09/24/2021 1104   CREATININE 0.77 09/24/2021 1104   CREATININE 0.87 01/26/2017 1529   CALCIUM 9.9 09/24/2021 1104   GFRNONAA 87 04/10/2020 1105   GFRNONAA 81 01/26/2017 1529   GFRAA 101 04/10/2020 1105   GFRAA 94 01/26/2017 1529    BNP    Component Value Date/Time   BNP 31.5 08/04/2019 1155   BNP <4.0 08/04/2016 1420    ProBNP No results found for: "PROBNP"  Imaging: DG Chest 2 View  Result Date: 04/26/2022 CLINICAL DATA:  cough EXAM: CHEST - 2 VIEW COMPARISON:  11/24/2021 FINDINGS: The heart size and mediastinal contours are within normal limits. There is diffuse pulmonary interstitial prominence which could be seen with atypical infection, edema or interstitial lung disease. There is no focal consolidation. The visualized skeletal structures are unremarkable. IMPRESSION: Nonspecific interstitial prominence consistent with atypical infection, edema or interstitial lung disease. No focal consolidation. Electronically Signed   By: Sammie Bench M.D.   On: 04/26/2022 19:03     Assessment & Plan:   Type 2 diabetes mellitus without complication, without long-term current use of insulin ([place]) - Ambulatory referral to Ophthalmology - POCT glycosylated hemoglobin (Hb A1C) - metFORMIN (GLUCOPHAGE) 500 MG tablet; Take 1 tablet (500 mg total) by mouth 2 (two) times daily with a meal.  Dispense: 180 tablet; Refill: 3  2. Essential hypertension  - metFORMIN  (GLUCOPHAGE) 500 MG tablet; Take 1 tablet (500 mg total) by mouth 2 (two) times daily with a meal.  Dispense: 180 tablet; Refill: 3 - furosemide (LASIX) 20 MG tablet; Take 1 tablet (20 mg total) by mouth daily. TAKE 1 TABLET(20 MG) BY MOUTH DAILY  Dispense: 30 tablet; Refill: 11  3. Mixed hyperlipidemia  - atorvastatin (LIPITOR) 40 MG tablet; Take 1 tablet (40 mg total) by mouth daily.  Dispense: 90 tablet; Refill: 3  4. Bilateral lower extremity edema  - furosemide (LASIX) 20 MG tablet; Take 1 tablet (20 mg total) by mouth daily. TAKE 1 TABLET(20 MG) BY MOUTH DAILY  Dispense: 30 tablet; Refill: 11  5. Encounter for screening mammogram for malignant neoplasm of breast  - MM Digital Screening; Future  6. Colon cancer screening  - Cologuard  7. Acute cough  - predniSONE (DELTASONE) 20 MG tablet; Take 1 tablet (20 mg total) by mouth daily with breakfast for 5 days.  Dispense: 5 tablet; Refill: 0 - benzonatate (TESSALON) 200 MG capsule; Take 1 capsule (200 mg total) by mouth 2 (two) times daily as needed for cough.  Dispense: 20 capsule; Refill: 0  Follow up:  Follow up in 6 months or sooner if needed     Fenton Foy, NP 05/12/2022

## 2022-05-20 ENCOUNTER — Other Ambulatory Visit: Payer: Self-pay

## 2022-06-17 ENCOUNTER — Other Ambulatory Visit: Payer: Self-pay

## 2022-06-23 ENCOUNTER — Other Ambulatory Visit: Payer: Self-pay

## 2022-06-24 ENCOUNTER — Other Ambulatory Visit: Payer: Self-pay

## 2022-07-20 ENCOUNTER — Other Ambulatory Visit: Payer: Self-pay

## 2022-07-22 ENCOUNTER — Other Ambulatory Visit: Payer: Self-pay

## 2022-07-22 ENCOUNTER — Telehealth (HOSPITAL_COMMUNITY): Payer: No Payment, Other | Admitting: Psychiatry

## 2022-07-22 ENCOUNTER — Encounter (HOSPITAL_COMMUNITY): Payer: Self-pay

## 2022-07-25 ENCOUNTER — Encounter (HOSPITAL_COMMUNITY): Payer: Self-pay | Admitting: Psychiatry

## 2022-07-25 ENCOUNTER — Telehealth (INDEPENDENT_AMBULATORY_CARE_PROVIDER_SITE_OTHER): Payer: No Payment, Other | Admitting: Psychiatry

## 2022-07-25 ENCOUNTER — Other Ambulatory Visit: Payer: Self-pay

## 2022-07-25 DIAGNOSIS — F3181 Bipolar II disorder: Secondary | ICD-10-CM

## 2022-07-25 MED ORDER — LURASIDONE HCL 60 MG PO TABS
60.0000 mg | ORAL_TABLET | Freq: Every day | ORAL | 3 refills | Status: DC
  Filled 2022-07-25 – 2022-08-18 (×2): qty 30, 30d supply, fill #0
  Filled 2022-09-19: qty 30, 30d supply, fill #1

## 2022-07-25 MED ORDER — GABAPENTIN 100 MG PO CAPS: ORAL_CAPSULE | ORAL | 3 refills | Status: DC

## 2022-07-25 MED ORDER — MELATONIN 5 MG PO TABS: 5.0000 mg | ORAL_TABLET | Freq: Every evening | ORAL | 3 refills | Status: DC | PRN

## 2022-07-25 NOTE — Progress Notes (Signed)
[facility] MD/PA/NP OP Progress Note Virtual Visit via Video Note  I connected with Christie Walker on 07/25/22 at  2:00 PM EDT by a video enabled telemedicine application and verified that I am speaking with the correct person using two identifiers.  Location: Patient: Home Provider: Clinic   I discussed the limitations of evaluation and management by telemedicine and the availability of in person appointments. The patient expressed understanding and agreed to proceed.  I provided 30 minutes of non-face-to-face time during this encounter.       07/25/2022 1:32 PM Christie Walker  MRN:  [id number]  Chief Complaint: "I have been doing fine".   HPI:  52 year old female seen today for follow up psychiatric evaluation. She has a psychiatric history of bipolar disorder and and schizophrenia.  She is currently managed on gabapentin 100 mg twice daily, Melatonin 5 mg nightly, and Latuda 60 mg daily.  She notes her medications are effective in managing her psychiatric conditions.   Today was well-groomed, pleasant, cooperative, and engaged in conversation.  She informed Probation officer that she has been doing just fine.  She notes that she stays busy at work.  Patient works at [employer] and finds her job enjoyable.  Overall she notes her mood is stable and reports that she has minimal anxiety and depression.  Today provider conducted a GAD-7 and patient scored a 3, at her last visit she scored a 1.  Provider also conducted PHQ-9 the patient scored a 1, at her last visit she scored a 1.  She endorses adequate sleep and appetite.  Today she denies SI/HI/VH, mania, paranoia.    Patient informed writer that at times she has pain in her knees/legs.  She notes that she has been more active at work and this pain has increasingly worsened but reports that she is able to cope with it with over-the-counter medications.  No medication changes made today.  Patient agreeable to continue medication as  prescribed and will follow-up outpatient counseling for therapy.  No other concerns at this time     Visit Diagnosis:    ICD-10-CM   1. Bipolar 2 disorder (HCC)  F31.81 gabapentin (NEURONTIN) 100 MG capsule    Lurasidone HCl (LATUDA) 60 MG TABS    melatonin 5 MG TABS      Past Psychiatric History:  bipolar disorder and and schizophrenia  Past Medical History:  Past Medical History:  Diagnosis Date   Acid reflux    Bipolar 1 disorder ([place])    Essential hypertension 06/14/2016   No past surgical history on file.  Family Psychiatric History: Paternal aunts bipolar disorder and father has mental health conditions but unaware which one.   Family History:  Family History  Problem Relation Age of Onset   Mental illness Other     Social History:  Social History   Socioeconomic History   Marital status: Legally Separated    Spouse name: Not on file   Number of children: Not on file   Years of education: Not on file   Highest education level: Not on file  Occupational History   Not on file  Tobacco Use   Smoking status: Never   Smokeless tobacco: Never  Vaping Use   Vaping Use: Never used  Substance and Sexual Activity   Alcohol use: No   Drug use: No   Sexual activity: Not Currently    Birth control/protection: None  Other Topics Concern   Not on file  Social History Narrative  Not on file   Social Determinants of Health   Financial Resource Strain: Not on file  Food Insecurity: Not on file  Transportation Needs: Not on file  Physical Activity: Not on file  Stress: Not on file  Social Connections: Not on file    Allergies:  Allergies  Allergen Reactions   Bee Venom Swelling    Swelling at site    Amoxicillin Hives and Other (See Comments)    Patient states that she can take this she just has a sensitivity when its taken along with pain medication   Hydrocodone Hives and Other (See Comments)    Sensitivity when taken along with antibiotics like  amoxicillin per patient   Insect Extract Other (See Comments)    Spiders - make tongue burn   Propoxyphene N-Acetaminophen Nausea And Vomiting and Rash    ** Darvacet    Metabolic Disorder Labs: Lab Results  Component Value Date   HGBA1C 6.5 (A) 05/12/2022   MPG 139.85 08/06/2019   MPG 128.37 08/01/2019   No results found for: "PROLACTIN" Lab Results  Component Value Date   CHOL 229 (H) 09/24/2021   TRIG 162 (H) 09/24/2021   HDL 46 09/24/2021   CHOLHDL 5.0 (H) 09/24/2021   VLDL 32 (H) 06/14/2016   LDLCALC 154 (H) 09/24/2021   LDLCALC 132 (H) 12/24/2020   Lab Results  Component Value Date   TSH 1.080 10/04/2019   TSH 0.262 (L) 08/06/2019    Therapeutic Level Labs: No results found for: "LITHIUM" No results found for: "VALPROATE" No results found for: "CBMZ"  Current Medications: Current Outpatient Medications  Medication Sig Dispense Refill   acetaminophen (TYLENOL) 325 MG tablet Take 650 mg by mouth every 6 (six) hours as needed for mild pain or headache.     atorvastatin (LIPITOR) 40 MG tablet Take 1 tablet (40 mg total) by mouth daily. 90 tablet 3   benzonatate (TESSALON) 100 MG capsule Take 1 capsule (100 mg total) by mouth every 8 (eight) hours. (Patient not taking: Reported on 05/12/2022) 21 capsule 0   benzonatate (TESSALON) 200 MG capsule Take 1 capsule (200 mg total) by mouth 2 (two) times daily as needed for cough. 20 capsule 0   COVID-19 mRNA bivalent vaccine, Moderna, (MODERNA COVID-19 BIVAL BOOSTER) 50 MCG/0.5ML injection Inject into the muscle. (Patient not taking: Reported on 03/26/2021) 0.5 mL 0   doxycycline (VIBRAMYCIN) 100 MG capsule Take 1 capsule (100 mg total) by mouth 2 (two) times daily. (Patient not taking: Reported on 05/12/2022) 20 capsule 0   fluticasone (FLONASE) 50 MCG/ACT nasal spray Place 2 sprays into both nostrils daily. (Patient taking differently: Place 2 sprays into both nostrils daily as needed for allergies.) 9.9 g 0   furosemide (LASIX)  20 MG tablet Take 1 tablet (20 mg total) by mouth daily. TAKE 1 TABLET(20 MG) BY MOUTH DAILY 30 tablet 11   gabapentin (NEURONTIN) 100 MG capsule TAKE 1 CAPSULE BY MOUTH TWICE DAILY 60 capsule 3   ibuprofen (ADVIL) 800 MG tablet Take 1 tablet (800 mg total) by mouth every 8 (eight) hours as needed. (Patient taking differently: Take 800 mg by mouth every 8 (eight) hours as needed for moderate pain.) 30 tablet 2   influenza vac split quadrivalent PF (FLUARIX) 0.5 ML injection Inject into the muscle. (Patient not taking: Reported on 03/26/2021) 0.5 mL 0   IRON PO Take 1 tablet by mouth daily.     Lurasidone HCl (LATUDA) 60 MG TABS Take 1 tablet (60 mg  total) by mouth daily. 30 tablet 3   melatonin 5 MG TABS Take 1 tablet (5 mg total) by mouth at bedtime as needed. 60 tablet 3   metFORMIN (GLUCOPHAGE) 500 MG tablet Take 1 tablet (500 mg total) by mouth 2 (two) times daily with a meal. 180 tablet 3   oseltamivir (TAMIFLU) 75 MG capsule Take 1 capsule (75 mg total) by mouth every 12 (twelve) hours. (Patient not taking: Reported on 05/12/2022) 10 capsule 0   No current facility-administered medications for this visit.     Musculoskeletal: Strength & Muscle Tone: within normal limits Gait & Station: normal Patient leans: N/A  Psychiatric Specialty Exam: Review of Systems  There were no vitals taken for this visit.There is no height or weight on file to calculate BMI.  General Appearance: Well Groomed  Eye Contact:  Good  Speech:  Clear and Coherent and Normal Rate  Volume:  Normal  Mood:  Euthymic  Affect:  Appropriate and Congruent  Thought Process:  Coherent, Goal Directed and Linear  Orientation:  Full (Time, Place, and Person)  Thought Content: WDL and Logical   Suicidal Thoughts:  No  Homicidal Thoughts:  No  Memory:  Immediate;   Good Recent;   Good Remote;   Good  Judgement:  Good  Insight:  Good  Psychomotor Activity:  Normal  Concentration:  Concentration: Good and Attention  Span: Good  Recall:  Good  Fund of Knowledge: Good  Language: Good  Akathisia:  No  Handed:  Right  AIMS (if indicated): Not done  Assets:  Communication Skills Desire for Improvement Financial Resources/Insurance Housing Social Support  ADL's:  Intact  Cognition: WNL  Sleep:  Good   Screenings: GAD-7    Flowsheet Row Video Visit from 07/25/2022 in [facility] Video Visit from 04/22/2022 in [facility] Video Visit from 01/19/2022 in [facility] Video Visit from 05/24/2021 in [facility] Video Visit from 02/19/2021 in [facility]  Total GAD-7 Score 3 1 5 5 6       PHQ2-9    Flowsheet Row Video Visit from 07/25/2022 in [facility] Video Visit from 04/22/2022 in [facility] Video Visit from 01/19/2022 in [facility] Office Visit from 09/24/2021 in [place] Office Visit from 06/25/2021 in [place]  PHQ-2 Total Score 0 0 0 0 0  PHQ-9 Total Score 1 1 2  -- --      Flowsheet Row ED from 04/26/2022 in [facility] at [facility] ED from 11/24/2021 in [facility] Emergency Department at [facility] Video Visit from 08/21/2020 in [place] No Risk No Risk No Risk        Assessment and Plan: Patient informed writer that she is doing well on her current medication regimen.  No medication changes made today.  Patient agreeable to continue medications as prescribed   1. Bipolar 2 disorder (HCC)  Continue- gabapentin (NEURONTIN) 100 MG capsule; Take 1 capsule (100 mg total) by mouth 2 (two) times daily.  Dispense: 60 capsule; Refill: 3 Continue- lurasidone 60 MG TABS; Take 1 tablet (60 mg total) by mouth daily.  Dispense: 90 tablet;  Refill: 3 Continue- melatonin 5 MG TABS; Take 1 tablet (5 mg total) by mouth at bedtime as needed.  Dispense: 60 tablet; Refill:  3  Follow-up in 3 months  Salley Slaughter, NP  07/25/2022, 1:32 PM

## 2022-08-18 ENCOUNTER — Other Ambulatory Visit: Payer: Self-pay

## 2022-09-10 ENCOUNTER — Other Ambulatory Visit (HOSPITAL_COMMUNITY): Payer: Self-pay | Admitting: Psychiatry

## 2022-09-10 DIAGNOSIS — F3181 Bipolar II disorder: Secondary | ICD-10-CM

## 2022-09-19 ENCOUNTER — Other Ambulatory Visit: Payer: Self-pay

## 2022-09-23 ENCOUNTER — Other Ambulatory Visit: Payer: Self-pay

## 2022-09-28 ENCOUNTER — Telehealth (INDEPENDENT_AMBULATORY_CARE_PROVIDER_SITE_OTHER): Payer: No Payment, Other | Admitting: Psychiatry

## 2022-09-28 ENCOUNTER — Encounter (HOSPITAL_COMMUNITY): Payer: Self-pay | Admitting: Psychiatry

## 2022-09-28 ENCOUNTER — Other Ambulatory Visit: Payer: Self-pay

## 2022-09-28 DIAGNOSIS — F3181 Bipolar II disorder: Secondary | ICD-10-CM | POA: Diagnosis not present

## 2022-09-28 MED ORDER — GABAPENTIN 100 MG PO CAPS: 100.0000 mg | ORAL_CAPSULE | Freq: Two times a day (BID) | ORAL | 3 refills | Status: DC

## 2022-09-28 MED ORDER — MELATONIN 5 MG PO TABS
5.0000 mg | ORAL_TABLET | Freq: Every evening | ORAL | 3 refills | Status: DC | PRN
Start: 2022-09-28 — End: 2022-12-22

## 2022-09-28 MED ORDER — LURASIDONE HCL 60 MG PO TABS
60.0000 mg | ORAL_TABLET | Freq: Every day | ORAL | 3 refills | Status: DC
Start: 2022-09-28 — End: 2022-12-22
  Filled 2022-09-28 – 2022-10-19 (×2): qty 30, 30d supply, fill #0
  Filled 2022-11-21 – 2022-12-01 (×2): qty 30, 30d supply, fill #1

## 2022-09-28 NOTE — Progress Notes (Signed)
[facility] MD/PA/NP OP Progress Note Virtual Visit via Video Note  I connected with Christie Walker on 09/28/22 at  9:00 AM EDT by a video enabled telemedicine application and verified that I am speaking with the correct person using two identifiers.  Location: Patient: Home Provider: Clinic   I discussed the limitations of evaluation and management by telemedicine and the availability of in person appointments. The patient expressed understanding and agreed to proceed.  I provided 30 minutes of non-face-to-face time during this encounter.       09/28/2022 9:20 AM Christie Walker  MRN:  [id number]  Chief Complaint: "I have shaky hands".   HPI:  52 year old female seen today for follow up psychiatric evaluation. She has a psychiatric history of bipolar disorder and and schizophrenia.  She is currently managed on gabapentin 100 mg twice daily, Melatonin 5 mg nightly, and Latuda 60 mg daily.  She notes her medications are effective in managing her psychiatric conditions.   Today was well-groomed, pleasant, cooperative, and engaged in conversation.  She informed Clinical research associate that she has shaky hands.  Patient notes this has been going on for over a month.  Provider unable to conduct an aims assessment as visit was virtual.  Provider requested patient come into clinic at her next visit.  She was agreeable to this.  Patient has not had routine psychiatric labs in over a year.  Provider ordered CBC CMP, lipid panel, HgbA1c, thyroid panel, LFT, and prolactin level.    The patient reports that her anxiety and depression continue to be well-managed.  Provider conducted a GAD-7 and patient scored and patient scored a 6, at her last visit she scored a 3.  Provider also conducted PHQ-9 patient scored a 1, at her last visit she scored a 1.  Today no medication changes made.  Patient agreeable to taking medication as prescribed.  Patient will have labs drawn prior to her next visit.  No other concerns at  this time.   Visit Diagnosis:    ICD-10-CM   1. Bipolar 2 disorder (HCC)  F31.81 CBC w/Diff/Platelet    Lipid Profile    Hepatic function panel    HgB A1c    Prolactin    Thyroid Panel With TSH    Comprehensive Metabolic Panel (CMET)    Lurasidone HCl (LATUDA) 60 MG TABS    melatonin 5 MG TABS    gabapentin (NEURONTIN) 100 MG capsule      Past Psychiatric History:  bipolar disorder and and schizophrenia  Past Medical History:  Past Medical History:  Diagnosis Date   Acid reflux    Bipolar 1 disorder (HCC)    Essential hypertension 06/14/2016   No past surgical history on file.  Family Psychiatric History: Paternal aunts bipolar disorder and father has mental health conditions but unaware which one.   Family History:  Family History  Problem Relation Age of Onset   Mental illness Other     Social History:  Social History   Socioeconomic History   Marital status: Legally Separated    Spouse name: Not on file   Number of children: Not on file   Years of education: Not on file   Highest education level: Not on file  Occupational History   Not on file  Tobacco Use   Smoking status: Never   Smokeless tobacco: Never  Vaping Use   Vaping Use: Never used  Substance and Sexual Activity   Alcohol use: No   Drug use: No  Sexual activity: Not Currently    Birth control/protection: None  Other Topics Concern   Not on file  Social History Narrative   Not on file   Social Determinants of Health   Financial Resource Strain: Not on file  Food Insecurity: Not on file  Transportation Needs: Not on file  Physical Activity: Not on file  Stress: Not on file  Social Connections: Not on file    Allergies:  Allergies  Allergen Reactions   Bee Venom Swelling    Swelling at site    Amoxicillin Hives and Other (See Comments)    Patient states that she can take this she just has a sensitivity when its taken along with pain medication   Hydrocodone Hives and Other (See  Comments)    Sensitivity when taken along with antibiotics like amoxicillin per patient   Insect Extract Other (See Comments)    Spiders - make tongue burn   Propoxyphene N-Acetaminophen Nausea And Vomiting and Rash    ** Darvacet    Metabolic Disorder Labs: Lab Results  Component Value Date   HGBA1C 6.5 (A) 05/12/2022   MPG 139.85 08/06/2019   MPG 128.37 08/01/2019   No results found for: "PROLACTIN" Lab Results  Component Value Date   CHOL 229 (H) 09/24/2021   TRIG 162 (H) 09/24/2021   HDL 46 09/24/2021   CHOLHDL 5.0 (H) 09/24/2021   VLDL 32 (H) 06/14/2016   LDLCALC 154 (H) 09/24/2021   LDLCALC 132 (H) 12/24/2020   Lab Results  Component Value Date   TSH 1.080 10/04/2019   TSH 0.262 (L) 08/06/2019    Therapeutic Level Labs: No results found for: "LITHIUM" No results found for: "VALPROATE" No results found for: "CBMZ"  Current Medications: Current Outpatient Medications  Medication Sig Dispense Refill   acetaminophen (TYLENOL) 325 MG tablet Take 650 mg by mouth every 6 (six) hours as needed for mild pain or headache.     atorvastatin (LIPITOR) 40 MG tablet Take 1 tablet (40 mg total) by mouth daily. 90 tablet 3   benzonatate (TESSALON) 100 MG capsule Take 1 capsule (100 mg total) by mouth every 8 (eight) hours. (Patient not taking: Reported on 05/12/2022) 21 capsule 0   benzonatate (TESSALON) 200 MG capsule Take 1 capsule (200 mg total) by mouth 2 (two) times daily as needed for cough. 20 capsule 0   COVID-19 mRNA bivalent vaccine, Moderna, (MODERNA COVID-19 BIVAL BOOSTER) 50 MCG/0.5ML injection Inject into the muscle. (Patient not taking: Reported on 03/26/2021) 0.5 mL 0   doxycycline (VIBRAMYCIN) 100 MG capsule Take 1 capsule (100 mg total) by mouth 2 (two) times daily. (Patient not taking: Reported on 05/12/2022) 20 capsule 0   fluticasone (FLONASE) 50 MCG/ACT nasal spray Place 2 sprays into both nostrils daily. (Patient taking differently: Place 2 sprays into both  nostrils daily as needed for allergies.) 9.9 g 0   furosemide (LASIX) 20 MG tablet Take 1 tablet (20 mg total) by mouth daily. TAKE 1 TABLET(20 MG) BY MOUTH DAILY 30 tablet 11   gabapentin (NEURONTIN) 100 MG capsule Take 1 capsule (100 mg total) by mouth 2 (two) times daily. 60 capsule 3   ibuprofen (ADVIL) 800 MG tablet Take 1 tablet (800 mg total) by mouth every 8 (eight) hours as needed. (Patient taking differently: Take 800 mg by mouth every 8 (eight) hours as needed for moderate pain.) 30 tablet 2   influenza vac split quadrivalent PF (FLUARIX) 0.5 ML injection Inject into the muscle. (Patient not taking: Reported  on 03/26/2021) 0.5 mL 0   IRON PO Take 1 tablet by mouth daily.     Lurasidone HCl (LATUDA) 60 MG TABS Take 1 tablet (60 mg total) by mouth daily. 30 tablet 3   melatonin 5 MG TABS Take 1 tablet (5 mg total) by mouth at bedtime as needed. 60 tablet 3   metFORMIN (GLUCOPHAGE) 500 MG tablet Take 1 tablet (500 mg total) by mouth 2 (two) times daily with a meal. 180 tablet 3   oseltamivir (TAMIFLU) 75 MG capsule Take 1 capsule (75 mg total) by mouth every 12 (twelve) hours. (Patient not taking: Reported on 05/12/2022) 10 capsule 0   No current facility-administered medications for this visit.     Musculoskeletal: Strength & Muscle Tone: within normal limits Gait & Station: normal Patient leans: N/A  Psychiatric Specialty Exam: Review of Systems  There were no vitals taken for this visit.There is no height or weight on file to calculate BMI.  General Appearance: Well Groomed  Eye Contact:  Good  Speech:  Clear and Coherent and Normal Rate  Volume:  Normal  Mood:  Euthymic  Affect:  Appropriate and Congruent  Thought Process:  Coherent, Goal Directed and Linear  Orientation:  Full (Time, Place, and Person)  Thought Content: WDL and Logical   Suicidal Thoughts:  No  Homicidal Thoughts:  No  Memory:  Immediate;   Good Recent;   Good Remote;   Good  Judgement:  Good   Insight:  Good  Psychomotor Activity:  Normal  Concentration:  Concentration: Good and Attention Span: Good  Recall:  Good  Fund of Knowledge: Good  Language: Good  Akathisia:  No  Handed:  Right  AIMS (if indicated): Not done  Assets:  Communication Skills Desire for Improvement Financial Resources/Insurance Housing Social Support  ADL's:  Intact  Cognition: WNL  Sleep:  Good   Screenings: GAD-7    Flowsheet Row Video Visit from 09/28/2022 in [facility] Video Visit from 07/25/2022 in [facility] Video Visit from 04/22/2022 in [facility] Video Visit from 01/19/2022 in [facility] Video Visit from 05/24/2021 in [facility]  Total GAD-7 Score 6 3 1 5 5       PHQ2-9    Flowsheet Row Video Visit from 09/28/2022 in [facility] Video Visit from 07/25/2022 in [facility] Video Visit from 04/22/2022 in [facility] Video Visit from 01/19/2022 in [facility] Office Visit from 09/24/2021 in [facility]  PHQ-2 Total Score 0 0 0 0 0  PHQ-9 Total Score 1 1 1 2  --      Flowsheet Row ED from 04/26/2022 in [facility] Department at [facility] ED from 11/24/2021 in [facility] Emergency Department at [facility] Video Visit from 08/21/2020 in [facility]  C-SSRS RISK CATEGORY No Risk No Risk No Risk        Assessment and Plan: Patient informed writer that she is doing well on her current medication regimen.  No medication changes made today.  Patient agreeable to continue medications as prescribed. Provider ordered CBC CMP, lipid panel, HgbA1c, thyroid panel, LFT, and prolactin level.  Patient will have these labs drawn prior to her next visit.  1. Bipolar 2  disorder (HCC)  - CBC w/Diff/Platelet - Lipid Profile - Hepatic function panel - HgB  A1c - Prolactin - Thyroid Panel With TSH - Comprehensive Metabolic Panel (CMET) Continue- Lurasidone HCl (LATUDA) 60 MG TABS; Take 1 tablet (60 mg total) by mouth daily.  Dispense: 30 tablet; Refill: 3 Continue- melatonin 5 MG TABS; Take 1 tablet (5 mg total) by mouth at bedtime as needed.  Dispense: 60 tablet; Refill: 3 Continue- gabapentin (NEURONTIN) 100 MG capsule; Take 1 capsule (100 mg total) by mouth 2 (two) times daily.  Dispense: 60 capsule; Refill: 3    Follow-up in 2.5 months  Shanna Cisco, NP  09/28/2022, 9:20 AM

## 2022-10-19 ENCOUNTER — Other Ambulatory Visit: Payer: Self-pay

## 2022-10-24 ENCOUNTER — Other Ambulatory Visit: Payer: Self-pay

## 2022-11-11 ENCOUNTER — Ambulatory Visit (INDEPENDENT_AMBULATORY_CARE_PROVIDER_SITE_OTHER): Payer: Self-pay | Admitting: Nurse Practitioner

## 2022-11-11 ENCOUNTER — Encounter: Payer: Self-pay | Admitting: Nurse Practitioner

## 2022-11-11 VITALS — BP 118/80 | HR 89 | Ht 64.5 in | Wt 355.0 lb

## 2022-11-11 DIAGNOSIS — I10 Essential (primary) hypertension: Secondary | ICD-10-CM

## 2022-11-11 DIAGNOSIS — E119 Type 2 diabetes mellitus without complications: Secondary | ICD-10-CM

## 2022-11-11 DIAGNOSIS — Z13220 Encounter for screening for lipoid disorders: Secondary | ICD-10-CM

## 2022-11-11 DIAGNOSIS — R7303 Prediabetes: Secondary | ICD-10-CM

## 2022-11-11 DIAGNOSIS — R600 Localized edema: Secondary | ICD-10-CM

## 2022-11-11 DIAGNOSIS — Z1329 Encounter for screening for other suspected endocrine disorder: Secondary | ICD-10-CM

## 2022-11-11 DIAGNOSIS — E782 Mixed hyperlipidemia: Secondary | ICD-10-CM

## 2022-11-11 LAB — POCT GLYCOSYLATED HEMOGLOBIN (HGB A1C): Hemoglobin A1C: 6.5 % — AB (ref 4.0–5.6)

## 2022-11-11 MED ORDER — FUROSEMIDE 20 MG PO TABS
20.0000 mg | ORAL_TABLET | Freq: Every day | ORAL | 11 refills | Status: DC
Start: 2022-11-11 — End: 2023-05-12

## 2022-11-11 MED ORDER — METFORMIN HCL 500 MG PO TABS
500.0000 mg | ORAL_TABLET | Freq: Two times a day (BID) | ORAL | 3 refills | Status: DC
Start: 2022-11-11 — End: 2023-05-12

## 2022-11-11 MED ORDER — ATORVASTATIN CALCIUM 40 MG PO TABS: 40.0000 mg | ORAL_TABLET | Freq: Every day | ORAL | 3 refills | Status: DC

## 2022-11-11 NOTE — Progress Notes (Signed)
@Patient  ID: Christie Walker, female    DOB: [birth year], 52 y.o.   MRN: [id number]  Chief Complaint  Patient presents with   Follow-up    Referring provider: Ivonne Andrew, NP   HPI  Christie Walker 52 y.o. female  has a past medical history of Acid reflux, Bipolar 1 disorder (HCC), and Essential hypertension (06/14/2016), diabetes.     Patient presents today for a follow-up visit.  She is compliant with medications.  Her A1c in office today was 6.5.  She does need refills on medications today. Denies f/c/s, n/v/d, hemoptysis, PND, leg swelling. Denies chest pain or edema.       Allergies  Allergen Reactions   Bee Venom Swelling    Swelling at site    Amoxicillin Hives and Other (See Comments)    Patient states that she can take this she just has a sensitivity when its taken along with pain medication   Hydrocodone Hives and Other (See Comments)    Sensitivity when taken along with antibiotics like amoxicillin per patient   Insect Extract Other (See Comments)    Spiders - make tongue burn   Propoxyphene N-Acetaminophen Nausea And Vomiting and Rash    ** Darvacet    Immunization History  Administered Date(s) Administered   Moderna Covid-19 Vaccine Bivalent Booster 44yrs & up 02/23/2021   Tdap 08/04/2016    Past Medical History:  Diagnosis Date   Acid reflux    Bipolar 1 disorder (HCC)    Essential hypertension 06/14/2016    Tobacco History: Social History   Tobacco Use  Smoking Status Never  Smokeless Tobacco Never   Counseling given: Not Answered   Outpatient Encounter Medications as of 11/11/2022  Medication Sig   acetaminophen (TYLENOL) 325 MG tablet Take 650 mg by mouth every 6 (six) hours as needed for mild pain or headache.   fluticasone (FLONASE) 50 MCG/ACT nasal spray Place 2 sprays into both nostrils daily. (Patient taking differently: Place 2 sprays into both nostrils daily as needed for allergies.)   gabapentin (NEURONTIN) 100 MG  capsule Take 1 capsule (100 mg total) by mouth 2 (two) times daily.   ibuprofen (ADVIL) 800 MG tablet Take 1 tablet (800 mg total) by mouth every 8 (eight) hours as needed. (Patient taking differently: Take 800 mg by mouth every 8 (eight) hours as needed for moderate pain.)   influenza vac split quadrivalent PF (FLUARIX) 0.5 ML injection Inject into the muscle.   IRON PO Take 1 tablet by mouth daily.   melatonin 5 MG TABS Take 1 tablet (5 mg total) by mouth at bedtime as needed.   [DISCONTINUED] atorvastatin (LIPITOR) 40 MG tablet Take 1 tablet (40 mg total) by mouth daily.   [DISCONTINUED] furosemide (LASIX) 20 MG tablet Take 1 tablet (20 mg total) by mouth daily. TAKE 1 TABLET(20 MG) BY MOUTH DAILY   [DISCONTINUED] metFORMIN (GLUCOPHAGE) 500 MG tablet Take 1 tablet (500 mg total) by mouth 2 (two) times daily with a meal.   atorvastatin (LIPITOR) 40 MG tablet Take 1 tablet (40 mg total) by mouth daily.   furosemide (LASIX) 20 MG tablet Take 1 tablet (20 mg total) by mouth daily. TAKE 1 TABLET(20 MG) BY MOUTH DAILY   Lurasidone HCl (LATUDA) 60 MG TABS Take 1 tablet (60 mg total) by mouth daily.   metFORMIN (GLUCOPHAGE) 500 MG tablet Take 1 tablet (500 mg total) by mouth 2 (two) times daily with a meal.   [DISCONTINUED] benzonatate (TESSALON) 100 MG  capsule Take 1 capsule (100 mg total) by mouth every 8 (eight) hours. (Patient not taking: Reported on 05/12/2022)   [DISCONTINUED] benzonatate (TESSALON) 200 MG capsule Take 1 capsule (200 mg total) by mouth 2 (two) times daily as needed for cough.   [DISCONTINUED] COVID-19 mRNA bivalent vaccine, Moderna, (MODERNA COVID-19 BIVAL BOOSTER) 50 MCG/0.5ML injection Inject into the muscle. (Patient not taking: Reported on 03/26/2021)   [DISCONTINUED] doxycycline (VIBRAMYCIN) 100 MG capsule Take 1 capsule (100 mg total) by mouth 2 (two) times daily. (Patient not taking: Reported on 05/12/2022)   [DISCONTINUED] oseltamivir (TAMIFLU) 75 MG capsule Take 1 capsule (75  mg total) by mouth every 12 (twelve) hours. (Patient not taking: Reported on 05/12/2022)   No facility-administered encounter medications on file as of 11/11/2022.     Review of Systems  Review of Systems  Constitutional: Negative.   HENT: Negative.    Cardiovascular: Negative.   Gastrointestinal: Negative.   Allergic/Immunologic: Negative.   Neurological: Negative.   Psychiatric/Behavioral: Negative.         Physical Exam  BP 118/80 (BP Location: Left Arm, Patient Position: Sitting, Cuff Size: Large)   Pulse 89   Ht 5' 4.5" (1.638 m)   Wt (!) 355 lb (161 kg)   LMP 09/16/2022   SpO2 96%   BMI 59.99 kg/m   Wt Readings from Last 5 Encounters:  11/11/22 (!) 355 lb (161 kg)  05/12/22 (!) 343 lb (155.6 kg)  10/05/21 (!) 345 lb 8 oz (156.7 kg)  09/24/21 (!) 345 lb 4 oz (156.6 kg)  07/22/21 (!) 345 lb 6.4 oz (156.7 kg)     Physical Exam Vitals and nursing note reviewed.  Constitutional:      General: She is not in acute distress.    Appearance: She is well-developed.  Cardiovascular:     Rate and Rhythm: Normal rate and regular rhythm.  Pulmonary:     Effort: Pulmonary effort is normal.     Breath sounds: Normal breath sounds.  Neurological:     Mental Status: She is alert and oriented to person, place, and time.      Assessment & Plan:   Type 2 diabetes mellitus without complication, without long-term current use of insulin (HCC) - POCT glycosylated hemoglobin (Hb A1C)  2. Prediabetes  - metFORMIN (GLUCOPHAGE) 500 MG tablet; Take 1 tablet (500 mg total) by mouth 2 (two) times daily with a meal.  Dispense: 180 tablet; Refill: 3  3. Essential hypertension  - metFORMIN (GLUCOPHAGE) 500 MG tablet; Take 1 tablet (500 mg total) by mouth 2 (two) times daily with a meal.  Dispense: 180 tablet; Refill: 3 - furosemide (LASIX) 20 MG tablet; Take 1 tablet (20 mg total) by mouth daily. TAKE 1 TABLET(20 MG) BY MOUTH DAILY  Dispense: 30 tablet; Refill: 11  4. Bilateral  lower extremity edema  - furosemide (LASIX) 20 MG tablet; Take 1 tablet (20 mg total) by mouth daily. TAKE 1 TABLET(20 MG) BY MOUTH DAILY  Dispense: 30 tablet; Refill: 11  5. Mixed hyperlipidemia  - atorvastatin (LIPITOR) 40 MG tablet; Take 1 tablet (40 mg total) by mouth daily.  Dispense: 90 tablet; Refill: 3    Follow up:  Follow up in 6 months     Ivonne Andrew, NP 11/11/2022

## 2022-11-11 NOTE — Patient Instructions (Addendum)
1. Type 2 diabetes mellitus without complication, without long-term current use of insulin (HCC)  - POCT glycosylated hemoglobin (Hb A1C)  2. Prediabetes  - metFORMIN (GLUCOPHAGE) 500 MG tablet; Take 1 tablet (500 mg total) by mouth 2 (two) times daily with a meal.  Dispense: 180 tablet; Refill: 3  3. Essential hypertension  - metFORMIN (GLUCOPHAGE) 500 MG tablet; Take 1 tablet (500 mg total) by mouth 2 (two) times daily with a meal.  Dispense: 180 tablet; Refill: 3 - furosemide (LASIX) 20 MG tablet; Take 1 tablet (20 mg total) by mouth daily. TAKE 1 TABLET(20 MG) BY MOUTH DAILY  Dispense: 30 tablet; Refill: 11  4. Bilateral lower extremity edema  - furosemide (LASIX) 20 MG tablet; Take 1 tablet (20 mg total) by mouth daily. TAKE 1 TABLET(20 MG) BY MOUTH DAILY  Dispense: 30 tablet; Refill: 11  5. Mixed hyperlipidemia  - atorvastatin (LIPITOR) 40 MG tablet; Take 1 tablet (40 mg total) by mouth daily.  Dispense: 90 tablet; Refill: 3    Follow up:  Follow up in 6 months

## 2022-11-11 NOTE — Assessment & Plan Note (Signed)
-   POCT glycosylated hemoglobin (Hb A1C)  2. Prediabetes  - metFORMIN (GLUCOPHAGE) 500 MG tablet; Take 1 tablet (500 mg total) by mouth 2 (two) times daily with a meal.  Dispense: 180 tablet; Refill: 3  3. Essential hypertension  - metFORMIN (GLUCOPHAGE) 500 MG tablet; Take 1 tablet (500 mg total) by mouth 2 (two) times daily with a meal.  Dispense: 180 tablet; Refill: 3 - furosemide (LASIX) 20 MG tablet; Take 1 tablet (20 mg total) by mouth daily. TAKE 1 TABLET(20 MG) BY MOUTH DAILY  Dispense: 30 tablet; Refill: 11  4. Bilateral lower extremity edema  - furosemide (LASIX) 20 MG tablet; Take 1 tablet (20 mg total) by mouth daily. TAKE 1 TABLET(20 MG) BY MOUTH DAILY  Dispense: 30 tablet; Refill: 11  5. Mixed hyperlipidemia  - atorvastatin (LIPITOR) 40 MG tablet; Take 1 tablet (40 mg total) by mouth daily.  Dispense: 90 tablet; Refill: 3    Follow up:  Follow up in 6 months

## 2022-11-21 ENCOUNTER — Other Ambulatory Visit: Payer: Self-pay

## 2022-11-28 ENCOUNTER — Other Ambulatory Visit: Payer: Self-pay

## 2022-12-02 ENCOUNTER — Other Ambulatory Visit: Payer: Self-pay

## 2022-12-15 ENCOUNTER — Telehealth (HOSPITAL_COMMUNITY): Payer: No Payment, Other | Admitting: Psychiatry

## 2022-12-22 ENCOUNTER — Encounter (HOSPITAL_COMMUNITY): Payer: Self-pay | Admitting: Psychiatry

## 2022-12-22 ENCOUNTER — Ambulatory Visit (INDEPENDENT_AMBULATORY_CARE_PROVIDER_SITE_OTHER): Payer: No Payment, Other | Admitting: Psychiatry

## 2022-12-22 DIAGNOSIS — F3181 Bipolar II disorder: Secondary | ICD-10-CM | POA: Diagnosis not present

## 2022-12-22 MED ORDER — MELATONIN 5 MG PO TABS
5.0000 mg | ORAL_TABLET | Freq: Every evening | ORAL | 3 refills | Status: DC | PRN
Start: 2022-12-22 — End: 2023-04-20

## 2022-12-22 MED ORDER — LURASIDONE HCL 60 MG PO TABS: 60.0000 mg | ORAL_TABLET | Freq: Every day | ORAL | 3 refills | Status: DC

## 2022-12-22 MED ORDER — GABAPENTIN 100 MG PO CAPS: 100.0000 mg | ORAL_CAPSULE | Freq: Two times a day (BID) | ORAL | 3 refills | Status: DC

## 2022-12-22 NOTE — Progress Notes (Signed)
BH MD/PA/NP OP Progress Note       12/22/2022 12:11 PM Christie Walker  MRN:  [id number]  Chief Complaint: "My hands still shake".   HPI:  52 year old female seen today for follow up psychiatric evaluation. She has a psychiatric history of bipolar disorder and and schizophrenia.  She is currently managed on gabapentin 100 mg twice daily, Melatonin 5 mg nightly, and Latuda 60 mg daily.  She notes her medications are effective in managing her psychiatric conditions.   Today was well-groomed, pleasant, cooperative, and engaged in conversation.  She informed Clinical research associate that her hands continue to shake.  She reports that it worsens when she is holding her phone.  Provider conducted an aims assessment and patient scored a 0.  Provider informed patient that her symptoms are more consistent with essential tremor.  At this time patient notes that her tremor is not debilitating.  Provider informed patient that if it worsens to talk to her PCP about medication such as a beta-blocker to help manage it.  Provider also recommended strengthening techniques for her hands.  She endorsed understanding and agreed.   Patient informed writer that her mood continues to be stable and notes that she has minimal anxiety and depression.  Today provider conducted a GAD-7 and patient scored a 2, at her last visit she scored a 6.  Provider also conducted PHQ-9 and patient scored a 2, at her last visit she scored a 1.  She endorsed adequate sleep and appetite.  Today she denies SI/HI/VAH, mania, paranoia.   Patient got routine labs taken by her PCP.  Patient cholesterol was elevated as well as her glucose and HgbA1c.  Patient informed Clinical research associate that she was unable to pick up her cholesterol medication because it was too expensive but plans to start it.  Patient was unable to get her prolactin levels drawn.  Provider informed patient that this lab could be reordered in the future.  She endorsed understanding and agreed.    Patient continues to work at [employer].  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  She also notes that at times her brother who has mental health conditions as well as autism triggered her.  She notes that she is learning to cope with this.   No medication changes made today.  Patient agreeable to continue medication as prescribed.  No other concerns noted at this time.   Visit Diagnosis:    ICD-10-CM   1. Bipolar 2 disorder (HCC)  F31.81 gabapentin (NEURONTIN) 100 MG capsule    Lurasidone HCl (LATUDA) 60 MG TABS    melatonin 5 MG TABS       Past Psychiatric History:  bipolar disorder and and schizophrenia  Past Medical History:  Past Medical History:  Diagnosis Date   Acid reflux    Bipolar 1 disorder (HCC)    Essential hypertension 06/14/2016   No past surgical history on file.  Family Psychiatric History: Paternal aunts bipolar disorder and father has mental health conditions but unaware which one.   Family History:  Family History  Problem Relation Age of Onset   Mental illness Other     Social History:  Social History   Socioeconomic History   Marital status: Legally Separated    Spouse name: Not on file   Number of children: Not on file   Years of education: Not on file   Highest education level: Not on  file  Occupational History   Not on file  Tobacco Use   Smoking status: Never   Smokeless tobacco: Never  Vaping Use   Vaping status: Never Used  Substance and Sexual Activity   Alcohol use: No   Drug use: No   Sexual activity: Not Currently    Birth control/protection: None  Other Topics Concern   Not on file  Social History Narrative   Not on file   Social Determinants of Health   Financial Resource Strain: Not on file  Food Insecurity: Not on file  Transportation Needs: Not on file  Physical Activity: Not on file  Stress: Not on file  Social Connections: Not on  file    Allergies:  Allergies  Allergen Reactions   Bee Venom Swelling    Swelling at site    Amoxicillin Hives and Other (See Comments)    Patient states that she can take this she just has a sensitivity when its taken along with pain medication   Hydrocodone Hives and Other (See Comments)    Sensitivity when taken along with antibiotics like amoxicillin per patient   Insect Extract Other (See Comments)    Spiders - make tongue burn   Propoxyphene N-Acetaminophen Nausea And Vomiting and Rash    ** Darvacet    Metabolic Disorder Labs: Lab Results  Component Value Date   HGBA1C 6.5 (A) 11/11/2022   MPG 139.85 08/06/2019   MPG 128.37 08/01/2019   No results found for: "PROLACTIN" Lab Results  Component Value Date   CHOL 237 (H) 12/09/2022   TRIG 283 (H) 12/09/2022   HDL 47 12/09/2022   CHOLHDL 5.0 (H) 12/09/2022   VLDL 32 (H) 06/14/2016   LDLCALC 139 (H) 12/09/2022   LDLCALC 154 (H) 09/24/2021   Lab Results  Component Value Date   TSH 2.660 12/09/2022   TSH 1.080 10/04/2019    Therapeutic Level Labs: No results found for: "LITHIUM" No results found for: "VALPROATE" No results found for: "CBMZ"  Current Medications: Current Outpatient Medications  Medication Sig Dispense Refill   acetaminophen (TYLENOL) 325 MG tablet Take 650 mg by mouth every 6 (six) hours as needed for mild pain or headache.     atorvastatin (LIPITOR) 40 MG tablet Take 1 tablet (40 mg total) by mouth daily. 90 tablet 3   fluticasone (FLONASE) 50 MCG/ACT nasal spray Place 2 sprays into both nostrils daily. (Patient taking differently: Place 2 sprays into both nostrils daily as needed for allergies.) 9.9 g 0   furosemide (LASIX) 20 MG tablet Take 1 tablet (20 mg total) by mouth daily. TAKE 1 TABLET(20 MG) BY MOUTH DAILY 30 tablet 11   gabapentin (NEURONTIN) 100 MG capsule Take 1 capsule (100 mg total) by mouth 2 (two) times daily. 60 capsule 3   ibuprofen (ADVIL) 800 MG tablet Take 1 tablet (800 mg  total) by mouth every 8 (eight) hours as needed. (Patient taking differently: Take 800 mg by mouth every 8 (eight) hours as needed for moderate pain.) 30 tablet 2   influenza vac split quadrivalent PF (FLUARIX) 0.5 ML injection Inject into the muscle. 0.5 mL 0   IRON PO Take 1 tablet by mouth daily.     Lurasidone HCl (LATUDA) 60 MG TABS Take 1 tablet (60 mg total) by mouth daily. 30 tablet 3   melatonin 5 MG TABS Take 1 tablet (5 mg total) by mouth at bedtime as needed. 60 tablet 3   metFORMIN (GLUCOPHAGE) 500 MG tablet Take 1 tablet (  500 mg total) by mouth 2 (two) times daily with a meal. 180 tablet 3   No current facility-administered medications for this visit.     Musculoskeletal: Strength & Muscle Tone: within normal limits Gait & Station: normal Patient leans: N/A  Psychiatric Specialty Exam: Review of Systems  Blood pressure 127/77, pulse 81, height 5\' 4"  (1.626 m), weight (!) 357 lb (161.9 kg), SpO2 98%.Body mass index is 61.28 kg/m.  General Appearance: Well Groomed  Eye Contact:  Good  Speech:  Clear and Coherent and Normal Rate  Volume:  Normal  Mood:  Euthymic  Affect:  Appropriate and Congruent  Thought Process:  Coherent, Goal Directed and Linear  Orientation:  Full (Time, Place, and Person)  Thought Content: WDL and Logical   Suicidal Thoughts:  No  Homicidal Thoughts:  No  Memory:  Immediate;   Good Recent;   Good Remote;   Good  Judgement:  Good  Insight:  Good  Psychomotor Activity:  Normal  Concentration:  Concentration: Good and Attention Span: Good  Recall:  Good  Fund of Knowledge: Good  Language: Good  Akathisia:  No  Handed:  Right  AIMS (if indicated): done, 0  Assets:  Communication Skills Desire for Improvement Financial Resources/Insurance Housing Social Support  ADL's:  Intact  Cognition: WNL  Sleep:  Good   Screenings: GAD-7    Flowsheet Row Clinical Support from 12/22/2022 in [facility] Video Visit  from 09/28/2022 in [facility] Video Visit from 07/25/2022 in [facility] Video Visit from 04/22/2022 in [facility] Video Visit from 01[facility]19/2022 in [facility]  Total GAD-7 Score 2 6 3 1 5       PHQ2-9    Flowsheet Row Clinical Support from 12/22/2022 in [facility] Office Visit from 11/11/2022 in [facility] Video Visit from 09/28/2022 in [facility] Video Visit from 07/25/2022 in [facility] Video Visit from 04/22/2022 in [facility]  PHQ-2 Total Score 1 0 0 0 0  PHQ-9 Total Score 1 0 1 1 1       Flowsheet Row ED from 04/26/2022 in [facility] at [facility] ED from 11/24/2021 in [facility] Emergency Department at [facility] Video Visit from 08/21/2020 in [facility]  C-SSRS RISK CATEGORY No Risk No Risk No Risk        Assessment and Plan: Patient informed writer that she is doing well on her current medication regimen.  No medication changes made today.  Patient agreeable to continue medications as prescribed.  Provider assessed patient for TD however her symptoms are more consistent with an essential tremor.  Provider recommended patient following up with her PCP to discuss medication management if this tremors worsen.  Provider also recommended patient doing muscle exercises for her hand.  Patient's prolactin level was not drawn.  Provider informed patient that this could be reordered at a another visit.  Patient informed Clinical research associate that she currently does not have insurance.  Provider gave patient number to Ms. Henriette Combs. 1. Bipolar 2 disorder (HCC)  Continue- gabapentin (NEURONTIN) 100 MG capsule; Take 1 capsule (100 mg total) by mouth 2 (two) times daily.  Dispense: 60 capsule;  Refill: 3 Continue- Lurasidone HCl (LATUDA) 60 MG TABS; Take 1 tablet (60 mg total) by mouth daily.  Dispense: 30 tablet; Refill:  3 Continue- melatonin 5 MG TABS; Take 1 tablet (5 mg total) by mouth at bedtime as needed.  Dispense: 60 tablet; Refill: 3   Follow-up in 3 months  Shanna Cisco, NP  12/22/2022, 12:11 PM

## 2022-12-28 ENCOUNTER — Other Ambulatory Visit: Payer: Self-pay

## 2022-12-29 ENCOUNTER — Other Ambulatory Visit: Payer: Self-pay

## 2022-12-29 ENCOUNTER — Other Ambulatory Visit (HOSPITAL_COMMUNITY): Payer: Self-pay | Admitting: Psychiatry

## 2022-12-29 ENCOUNTER — Telehealth (HOSPITAL_COMMUNITY): Payer: Self-pay | Admitting: Psychiatry

## 2022-12-29 DIAGNOSIS — F3181 Bipolar II disorder: Secondary | ICD-10-CM

## 2022-12-29 MED ORDER — LURASIDONE HCL 60 MG PO TABS
60.0000 mg | ORAL_TABLET | Freq: Every day | ORAL | 3 refills | Status: DC
Start: 2022-12-29 — End: 2023-04-20
  Filled 2022-12-29: qty 30, 30d supply, fill #0
  Filled 2023-01-25 (×2): qty 30, 30d supply, fill #1
  Filled 2023-02-27: qty 30, 30d supply, fill #2
  Filled 2023-03-27: qty 30, 30d supply, fill #3

## 2022-12-29 NOTE — Telephone Encounter (Signed)
Medication sent to preferred pharmacy

## 2022-12-30 ENCOUNTER — Other Ambulatory Visit: Payer: Self-pay

## 2023-01-25 ENCOUNTER — Other Ambulatory Visit: Payer: Self-pay

## 2023-01-27 ENCOUNTER — Other Ambulatory Visit: Payer: Self-pay

## 2023-02-10 ENCOUNTER — Other Ambulatory Visit: Payer: Self-pay | Admitting: Nurse Practitioner

## 2023-02-10 DIAGNOSIS — Z1212 Encounter for screening for malignant neoplasm of rectum: Secondary | ICD-10-CM

## 2023-02-10 DIAGNOSIS — Z1211 Encounter for screening for malignant neoplasm of colon: Secondary | ICD-10-CM

## 2023-02-27 ENCOUNTER — Other Ambulatory Visit: Payer: Self-pay

## 2023-03-08 ENCOUNTER — Telehealth: Payer: Self-pay | Admitting: Nurse Practitioner

## 2023-03-08 NOTE — Telephone Encounter (Signed)
Pt states she has arthitiris and her knee has been hurting really bad (right knee) she thinks there is fluid. She is requesting to go to the orthopedics

## 2023-03-09 NOTE — Telephone Encounter (Signed)
Pt called and asked if she suppose to come see her Dr again or is she waiting on a referral about her knee??  Please call pt

## 2023-03-10 ENCOUNTER — Other Ambulatory Visit: Payer: Self-pay | Admitting: Nurse Practitioner

## 2023-03-10 DIAGNOSIS — G8929 Other chronic pain: Secondary | ICD-10-CM

## 2023-03-13 NOTE — Telephone Encounter (Signed)
Referral was placed. [place] ?

## 2023-03-21 ENCOUNTER — Encounter: Payer: Self-pay | Admitting: Physician Assistant

## 2023-03-21 ENCOUNTER — Other Ambulatory Visit (INDEPENDENT_AMBULATORY_CARE_PROVIDER_SITE_OTHER): Payer: Self-pay

## 2023-03-21 ENCOUNTER — Ambulatory Visit (INDEPENDENT_AMBULATORY_CARE_PROVIDER_SITE_OTHER): Payer: Self-pay | Admitting: Physician Assistant

## 2023-03-21 VITALS — Ht 64.0 in | Wt 360.0 lb

## 2023-03-21 DIAGNOSIS — M1711 Unilateral primary osteoarthritis, right knee: Secondary | ICD-10-CM

## 2023-03-21 DIAGNOSIS — G8929 Other chronic pain: Secondary | ICD-10-CM

## 2023-03-21 DIAGNOSIS — M25561 Pain in right knee: Secondary | ICD-10-CM

## 2023-03-21 MED ORDER — METHYLPREDNISOLONE ACETATE 40 MG/ML IJ SUSP
40.0000 mg | INTRAMUSCULAR | Status: AC | PRN
  Administered 2023-03-21: 40 mg via INTRA_ARTICULAR

## 2023-03-21 MED ORDER — BUPIVACAINE HCL 0.25 % IJ SOLN
2.0000 mL | INTRAMUSCULAR | Status: AC | PRN
  Administered 2023-03-21: 2 mL via INTRA_ARTICULAR

## 2023-03-21 MED ORDER — LIDOCAINE HCL 1 % IJ SOLN
2.0000 mL | INTRAMUSCULAR | Status: AC | PRN
  Administered 2023-03-21: 2 mL

## 2023-03-21 MED ORDER — TRAMADOL HCL 50 MG PO TABS: 50.0000 mg | ORAL_TABLET | Freq: Two times a day (BID) | ORAL | 1 refills | Status: DC | PRN

## 2023-03-21 NOTE — Progress Notes (Signed)
Office Visit Note   Patient: Christie Walker           Date of Birth: 07-Aug-1970           MRN: [id number] Visit Date: 03/21/2023              Requested by: Ivonne Andrew, NP [phone] [place],  [place] [address] PCP: Ivonne Andrew, NP   Assessment & Plan: Visit Diagnoses:  1. Unilateral primary osteoarthritis, right knee     Plan: Impression is right knee osteoarthritis.  Today, we discussed various treatment options to include cortisone injection and weight loss for which she would like to proceed.  She would like to hold off on referral to weight loss clinic.  Follow-up as needed.  Follow-Up Instructions: Return if symptoms worsen or fail to improve.   Orders:  Orders Placed This Encounter  Procedures   Large Joint Inj: R knee   XR KNEE 3 VIEW RIGHT   No orders of the defined types were placed in this encounter.     Procedures: Large Joint Inj: R knee on 03/21/2023 9:46 AM Indications: pain Details: 22 G needle, anterolateral approach Medications: 2 mL lidocaine 1 %; 2 mL bupivacaine 0.25 %; 40 mg methylPREDNISolone acetate 40 MG/ML      Clinical Data: No additional findings.   Subjective: Chief Complaint  Patient presents with   Right Knee - Pain    HPI patient is a pleasant 52 year old female who comes in today with right knee pain.  Symptoms initially began a few years ago but improved on their own.  Symptoms restarted about 1-1/2 months ago.  She denies any injury or change in activity.  Her pain is located throughout the entire knee and is constant.  She does have increased symptoms going from a seated to standing position as well as with walking and stair climbing.  She has been taking ibuprofen without relief.  No previous cortisone injection to the right knee.  Review of Systems as detailed in HPI.  All others reviewed and are negative.   Objective: Vital Signs: Ht 5\' 4"  (1.626 m)   Wt (!) 360 lb (163.3 kg)   BMI 61.79  kg/m   Physical Exam well-developed well-nourished female no acute distress.  Alert and oriented x 3.  Ortho Exam right knee exam: Range of motion 0 to 90 degrees.  Medial joint line tenderness.  Moderate patellofemoral crepitus.  She is neurovascularly intact distally.  Specialty Comments:  No specialty comments available.  Imaging: XR KNEE 3 VIEW RIGHT  Result Date: 03/21/2023 X-rays demonstrate bone-on-bone medial and patellofemoral compartments    PMFS History: Patient Active Problem List   Diagnosis Date Noted   De Quervain's tenosynovitis, right 10/05/2021   Bipolar 2 disorder (HCC)    Type 2 diabetes mellitus without complication, without long-term current use of insulin (HCC)    Obesity, Class III, BMI 40-49.9 (morbid obesity) (HCC)    Pneumonia due to COVID-19 virus 07/31/2019   SIRS (systemic inflammatory response syndrome) (HCC) 07/31/2019   Chest tightness 07/31/2019   Chest pain at rest 08/05/2016   Morbid obesity with BMI of 50.0-59.9, adult (HCC) 08/05/2016   Dyspnea on exertion 08/05/2016   HTN (hypertension) 06/14/2016   Metabolic syndrome 06/14/2016   Chronic knee pain 06/14/2016   OBESITY 03/08/2007   Temporomandibular joint disorder 03/08/2007   GERD 03/08/2007   Past Medical History:  Diagnosis Date   Acid reflux    Bipolar  1 disorder (HCC)    Essential hypertension 06/14/2016    Family History  Problem Relation Age of Onset   Mental illness Other     History reviewed. No pertinent surgical history. Social History   Occupational History   Not on file  Tobacco Use   Smoking status: Never   Smokeless tobacco: Never  Vaping Use   Vaping status: Never Used  Substance and Sexual Activity   Alcohol use: No   Drug use: No   Sexual activity: Not Currently    Birth control/protection: None

## 2023-03-27 ENCOUNTER — Other Ambulatory Visit: Payer: Self-pay

## 2023-03-28 ENCOUNTER — Other Ambulatory Visit: Payer: Self-pay

## 2023-03-30 ENCOUNTER — Encounter (HOSPITAL_COMMUNITY): Payer: Self-pay | Admitting: Psychiatry

## 2023-03-31 ENCOUNTER — Other Ambulatory Visit: Payer: Self-pay

## 2023-04-20 ENCOUNTER — Other Ambulatory Visit: Payer: Self-pay

## 2023-04-20 ENCOUNTER — Encounter (HOSPITAL_COMMUNITY): Payer: Self-pay | Admitting: Psychiatry

## 2023-04-20 ENCOUNTER — Ambulatory Visit (INDEPENDENT_AMBULATORY_CARE_PROVIDER_SITE_OTHER): Payer: No Payment, Other | Admitting: Psychiatry

## 2023-04-20 DIAGNOSIS — F3181 Bipolar II disorder: Secondary | ICD-10-CM

## 2023-04-20 MED ORDER — LURASIDONE HCL 60 MG PO TABS
60.0000 mg | ORAL_TABLET | Freq: Every day | ORAL | 3 refills | Status: DC
  Filled 2023-04-20 – 2023-04-26 (×2): qty 30, 30d supply, fill #0
  Filled 2023-05-26: qty 30, 30d supply, fill #1
  Filled 2023-06-27: qty 30, 30d supply, fill #2

## 2023-04-20 MED ORDER — MELATONIN 5 MG PO TABS: 5.0000 mg | ORAL_TABLET | Freq: Every evening | ORAL | 3 refills | Status: DC | PRN

## 2023-04-20 MED ORDER — GABAPENTIN 100 MG PO CAPS: 100.0000 mg | ORAL_CAPSULE | Freq: Two times a day (BID) | ORAL | 3 refills | Status: DC

## 2023-04-20 NOTE — Progress Notes (Signed)
BH MD/PA/NP OP Progress Note       04/20/2023 1:26 PM Christie Walker  MRN:  [id number]  Chief Complaint: "Everything is good".   HPI:  52 year old female seen today for follow up psychiatric evaluation. She has a psychiatric history of bipolar disorder and and schizophrenia.  She is currently managed on gabapentin 100 mg twice daily, Melatonin 5 mg nightly, and Latuda 60 mg daily.  She notes her medications are effective in managing her psychiatric conditions.   Today was well-groomed, pleasant, cooperative, and engaged in conversation.  She informed Clinical research associate that everything is good.  Patient notes that she is looking forward to spending the holidays with her mother and brother.  Since her last visit she informed writer that her mood continues to be stable and notes that she has minimal anxiety and depression.  Provider conducted a GAD-7 and patient scored a 3, at her last visit she scored a 2.  Provider also conducted PHQ-9 and patient scored a 1, at her last visit she scored a 2.  She endorses adequate sleep and appetite.  Today she denies SI/HI/VAH, mania, paranoia.   Patient informed writer that her pain has subsided.  Recently she received a cortisone injection in her knee.   Patient informed Clinical research associate that she continues to be without her cholesterol medication as she cannot afford it.  She notes that when she goes to pick up her prescription at [facility] it is $100.  Provider recommended changing to community health and wellness.  She notes that she would look into this.   Overall patient notes that she is doing well.  No medication changes made today.  Patient agreeable to continue medications as prescribed.  No other concerns noted at this time.     Visit Diagnosis:    ICD-10-CM   1. Bipolar 2 disorder (HCC)  F31.81 gabapentin (NEURONTIN) 100 MG capsule    melatonin 5 MG TABS    Lurasidone HCl (LATUDA) 60 MG TABS        Past Psychiatric History:  bipolar disorder and and  schizophrenia  Past Medical History:  Past Medical History:  Diagnosis Date   Acid reflux    Bipolar 1 disorder (HCC)    Essential hypertension 06/14/2016   History reviewed. No pertinent surgical history.  Family Psychiatric History: Paternal aunts bipolar disorder and father has mental health conditions but unaware which one.   Family History:  Family History  Problem Relation Age of Onset   Mental illness Other     Social History:  Social History   Socioeconomic History   Marital status: Legally Separated    Spouse name: Not on file   Number of children: Not on file   Years of education: Not on file   Highest education level: Not on file  Occupational History   Not on file  Tobacco Use   Smoking status: Never   Smokeless tobacco: Never  Vaping Use   Vaping status: Never Used  Substance and Sexual Activity   Alcohol use: No   Drug use: No   Sexual activity: Not Currently    Birth control/protection: None  Other Topics Concern   Not on file  Social History Narrative   Not on file   Social Drivers of Health   Financial Resource Strain: Not on file  Food Insecurity: Not on file  Transportation Needs: Not on file  Physical Activity: Not on file  Stress: Not on file  Social Connections: Not on file  Allergies:  Allergies  Allergen Reactions   Bee Venom Swelling    Swelling at site    Amoxicillin Hives and Other (See Comments)    Patient states that she can take this she just has a sensitivity when its taken along with pain medication   Hydrocodone Hives and Other (See Comments)    Sensitivity when taken along with antibiotics like amoxicillin per patient   Insect Extract Other (See Comments)    Spiders - make tongue burn   Propoxyphene N-Acetaminophen Nausea And Vomiting and Rash    ** Darvacet    Metabolic Disorder Labs: Lab Results  Component Value Date   HGBA1C 6.5 (A) 11/11/2022   MPG 139.85 08/06/2019   MPG 128.37 08/01/2019   No results  found for: "PROLACTIN" Lab Results  Component Value Date   CHOL 237 (H) 12/09/2022   TRIG 283 (H) 12/09/2022   HDL 47 12/09/2022   CHOLHDL 5.0 (H) 12/09/2022   VLDL 32 (H) 06/14/2016   LDLCALC 139 (H) 12/09/2022   LDLCALC 154 (H) 09/24/2021   Lab Results  Component Value Date   TSH 2.660 12/09/2022   TSH 1.080 10/04/2019    Therapeutic Level Labs: No results found for: "LITHIUM" No results found for: "VALPROATE" No results found for: "CBMZ"  Current Medications: Current Outpatient Medications  Medication Sig Dispense Refill   acetaminophen (TYLENOL) 325 MG tablet Take 650 mg by mouth every 6 (six) hours as needed for mild pain or headache.     atorvastatin (LIPITOR) 40 MG tablet Take 1 tablet (40 mg total) by mouth daily. 90 tablet 3   fluticasone (FLONASE) 50 MCG/ACT nasal spray Place 2 sprays into both nostrils daily. (Patient taking differently: Place 2 sprays into both nostrils daily as needed for allergies.) 9.9 g 0   furosemide (LASIX) 20 MG tablet Take 1 tablet (20 mg total) by mouth daily. TAKE 1 TABLET(20 MG) BY MOUTH DAILY 30 tablet 11   gabapentin (NEURONTIN) 100 MG capsule Take 1 capsule (100 mg total) by mouth 2 (two) times daily. 60 capsule 3   ibuprofen (ADVIL) 800 MG tablet Take 1 tablet (800 mg total) by mouth every 8 (eight) hours as needed. (Patient taking differently: Take 800 mg by mouth every 8 (eight) hours as needed for moderate pain (pain score 4-6).) 30 tablet 2   influenza vac split quadrivalent PF (FLUARIX) 0.5 ML injection Inject into the muscle. 0.5 mL 0   IRON PO Take 1 tablet by mouth daily.     Lurasidone HCl (LATUDA) 60 MG TABS Take 1 tablet (60 mg total) by mouth daily. 30 tablet 3   melatonin 5 MG TABS Take 1 tablet (5 mg total) by mouth at bedtime as needed. 60 tablet 3   metFORMIN (GLUCOPHAGE) 500 MG tablet Take 1 tablet (500 mg total) by mouth 2 (two) times daily with a meal. 180 tablet 3   traMADol (ULTRAM) 50 MG tablet Take 1 tablet (50 mg  total) by mouth every 12 (twelve) hours as needed. 30 tablet 1   No current facility-administered medications for this visit.     Musculoskeletal: Strength & Muscle Tone: within normal limits Gait & Station: normal Patient leans: N/A  Psychiatric Specialty Exam: Review of Systems  Blood pressure (!) 149/84, pulse 87, weight (!) 360 lb (163.3 kg), SpO2 (!) 87%.Body mass index is 61.79 kg/m.  General Appearance: Well Groomed  Eye Contact:  Good  Speech:  Clear and Coherent and Normal Rate  Volume:  Normal  Mood:  Euthymic  Affect:  Appropriate and Congruent  Thought Process:  Coherent, Goal Directed and Linear  Orientation:  Full (Time, Place, and Person)  Thought Content: WDL and Logical   Suicidal Thoughts:  No  Homicidal Thoughts:  No  Memory:  Immediate;   Good Recent;   Good Remote;   Good  Judgement:  Good  Insight:  Good  Psychomotor Activity:  Normal  Concentration:  Concentration: Good and Attention Span: Good  Recall:  Good  Fund of Knowledge: Good  Language: Good  Akathisia:  No  Handed:  Right  AIMS (if indicated): not done  Assets:  Communication Skills Desire for Improvement Financial Resources/Insurance Housing Social Support  ADL's:  Intact  Cognition: WNL  Sleep:  Good   Screenings: GAD-7    Flowsheet Row Clinical Support from 04/20/2023 in [facility] Clinical Support from 12/22/2022 in [facility] Video Visit from 09/28/2022 in [facility] Video Visit from 07/25/2022 in [facility] Video Visit from 04/22/2022 in [facility]  Total GAD-7 Score 3 2 6 3 1       PHQ2-9    Flowsheet Row Clinical Support from 04/20/2023 in [facility] Clinical Support from 12/22/2022 in [facility] Office Visit from 11/11/2022 in [facility] Video Visit from 09/28/2022 in [facility] Video Visit from 07/25/2022 in [facility]  PHQ-2 Total Score 0 1 0 0 0  PHQ-9 Total Score 1 1 0 1 1      Flowsheet Row ED from 04/26/2022 in [facility] Emergency Department at [facility] ED from 11/24/2021 in [facility] Emergency Department at [facility] Video Visit from 08/21/2020 in [facility]  C-SSRS RISK CATEGORY No Risk No Risk No Risk        Assessment and Plan: Patient informed writer that she is doing well on her current medication regimen.  No medication changes made today.  Patient agreeable to continue medications as prescribed.  Provider recommended sending her cholesterol medication to [facility] so that she can afford it.  She endorsed understanding and agreed.    1. Bipolar 2 disorder (HCC)  Continue- gabapentin (NEURONTIN) 100 MG capsule; Take 1 capsule (100 mg total) by mouth 2 (two) times daily.  Dispense: 60 capsule; Refill: 3 Continue- Lurasidone HCl (LATUDA) 60 MG TABS; Take 1 tablet (60 mg total) by mouth daily.  Dispense: 30 tablet; Refill: 3 Continue- melatonin 5 MG TABS; Take 1 tablet (5 mg total) by mouth at bedtime as needed.  Dispense: 60 tablet; Refill: 3   Follow-up in 2 months  Shanna Cisco, NP  04/20/2023, 1:26 PM

## 2023-04-26 ENCOUNTER — Other Ambulatory Visit: Payer: Self-pay

## 2023-04-28 ENCOUNTER — Other Ambulatory Visit: Payer: Self-pay

## 2023-05-12 ENCOUNTER — Ambulatory Visit (INDEPENDENT_AMBULATORY_CARE_PROVIDER_SITE_OTHER): Payer: Self-pay | Admitting: Nurse Practitioner

## 2023-05-12 ENCOUNTER — Other Ambulatory Visit: Payer: Self-pay

## 2023-05-12 ENCOUNTER — Encounter: Payer: Self-pay | Admitting: Nurse Practitioner

## 2023-05-12 VITALS — BP 142/93 | HR 85 | Temp 97.2°F | Wt 356.2 lb

## 2023-05-12 DIAGNOSIS — E119 Type 2 diabetes mellitus without complications: Secondary | ICD-10-CM

## 2023-05-12 DIAGNOSIS — R600 Localized edema: Secondary | ICD-10-CM

## 2023-05-12 DIAGNOSIS — E782 Mixed hyperlipidemia: Secondary | ICD-10-CM

## 2023-05-12 DIAGNOSIS — I10 Essential (primary) hypertension: Secondary | ICD-10-CM

## 2023-05-12 DIAGNOSIS — R7303 Prediabetes: Secondary | ICD-10-CM

## 2023-05-12 LAB — POCT URINALYSIS DIP (CLINITEK)
Bilirubin, UA: NEGATIVE
Blood, UA: NEGATIVE
Glucose, UA: NEGATIVE mg/dL
Ketones, POC UA: NEGATIVE mg/dL
Leukocytes, UA: NEGATIVE
Nitrite, UA: NEGATIVE
POC PROTEIN,UA: NEGATIVE
Spec Grav, UA: >=1.03 — AB (ref 1.010–1.025)
Urobilinogen, UA: 0.2 U/dL
pH, UA: 5.5 (ref 5.0–8.0)

## 2023-05-12 LAB — POCT GLYCOSYLATED HEMOGLOBIN (HGB A1C): Hemoglobin A1C: 6.5 % — AB (ref 4.0–5.6)

## 2023-05-12 MED ORDER — ATORVASTATIN CALCIUM 40 MG PO TABS
40.0000 mg | ORAL_TABLET | Freq: Every day | ORAL | 3 refills | Status: DC
  Filled 2023-05-12: qty 90, 90d supply, fill #0

## 2023-05-12 MED ORDER — FUROSEMIDE 20 MG PO TABS: 20.0000 mg | ORAL_TABLET | Freq: Every day | ORAL | 11 refills | Status: DC

## 2023-05-12 MED ORDER — METFORMIN HCL 500 MG PO TABS
500.0000 mg | ORAL_TABLET | Freq: Two times a day (BID) | ORAL | 3 refills | Status: DC
Start: 2023-05-12 — End: 2023-05-23

## 2023-05-12 NOTE — Progress Notes (Signed)
 Subjective   Patient ID: Christie Walker, female    DOB: [birth year], 52 y.o.   MRN: [id number]  Chief Complaint  Patient presents with   Follow-up    Referring provider: Oley Bascom RAMAN, NP  Nat Vicci Vital is a 53 y.o. female with Past Medical History: No date: Acid reflux No date: Bipolar 1 disorder (HCC) 06/14/2016: Essential hypertension   HPI  Patient presents today for a follow-up visit.  She is compliant with medications.  Her A1c in office today was 6.5. Stable from last visit.  She does need refills on medications today. She does need refills on medications today. Denies f/c/s, n/v/d, hemoptysis, PND, leg swelling. Denies chest pain or edema.     Allergies  Allergen Reactions   Bee Venom Swelling    Swelling at site    Amoxicillin Hives and Other (See Comments)    Patient states that she can take this she just has a sensitivity when its taken along with pain medication   Hydrocodone  Hives and Other (See Comments)    Sensitivity when taken along with antibiotics like amoxicillin per patient   Insect Extract Other (See Comments)    Spiders - make tongue burn   Propoxyphene N-Acetaminophen  Nausea And Vomiting and Rash    ** Darvacet    Immunization History  Administered Date(s) Administered   Moderna Covid-19 Vaccine Bivalent Booster 45yrs & up 02/23/2021   Tdap 08/04/2016    Tobacco History: Social History   Tobacco Use  Smoking Status Never  Smokeless Tobacco Never   Counseling given: Not Answered   Outpatient Encounter Medications as of 05/12/2023  Medication Sig   acetaminophen  (TYLENOL ) 325 MG tablet Take 650 mg by mouth every 6 (six) hours as needed for mild pain or headache.   fluticasone  (FLONASE ) 50 MCG/ACT nasal spray Place 2 sprays into both nostrils daily. (Patient taking differently: Place 2 sprays into both nostrils daily as needed for allergies.)   gabapentin  (NEURONTIN ) 100 MG capsule Take 1 capsule (100 mg total) by mouth 2  (two) times daily.   ibuprofen  (ADVIL ) 800 MG tablet Take 1 tablet (800 mg total) by mouth every 8 (eight) hours as needed. (Patient taking differently: Take 800 mg by mouth every 8 (eight) hours as needed for moderate pain (pain score 4-6).)   IRON PO Take 1 tablet by mouth daily.   Lurasidone  HCl (LATUDA ) 60 MG TABS Take 1 tablet (60 mg total) by mouth daily.   melatonin 5 MG TABS Take 1 tablet (5 mg total) by mouth at bedtime as needed.   traMADol  (ULTRAM ) 50 MG tablet Take 1 tablet (50 mg total) by mouth every 12 (twelve) hours as needed.   [DISCONTINUED] atorvastatin  (LIPITOR) 40 MG tablet Take 1 tablet (40 mg total) by mouth daily.   [DISCONTINUED] furosemide  (LASIX ) 20 MG tablet Take 1 tablet (20 mg total) by mouth daily. TAKE 1 TABLET(20 MG) BY MOUTH DAILY   [DISCONTINUED] metFORMIN  (GLUCOPHAGE ) 500 MG tablet Take 1 tablet (500 mg total) by mouth 2 (two) times daily with a meal.   atorvastatin  (LIPITOR) 40 MG tablet Take 1 tablet (40 mg total) by mouth daily.   furosemide  (LASIX ) 20 MG tablet Take 1 tablet (20 mg total) by mouth daily. TAKE 1 TABLET(20 MG) BY MOUTH DAILY   influenza vac split quadrivalent PF (FLUARIX) 0.5 ML injection Inject into the muscle. (Patient not taking: Reported on 05/12/2023)   metFORMIN  (GLUCOPHAGE ) 500 MG tablet Take 1 tablet (500 mg total) by mouth  2 (two) times daily with a meal.   No facility-administered encounter medications on file as of 05/12/2023.    Review of Systems  Review of Systems  Constitutional: Negative.   HENT: Negative.    Cardiovascular: Negative.   Gastrointestinal: Negative.   Allergic/Immunologic: Negative.   Neurological: Negative.   Psychiatric/Behavioral: Negative.       Objective:   BP (!) 142/93   Pulse 85   Temp (!) 97.2 F (36.2 C)   Wt (!) 356 lb 3.2 oz (161.6 kg)   SpO2 96%   BMI 61.14 kg/m   Wt Readings from Last 5 Encounters:  05/12/23 (!) 356 lb 3.2 oz (161.6 kg)  03/21/23 (!) 360 lb (163.3 kg)  11/11/22  (!) 355 lb (161 kg)  05/12/22 (!) 343 lb (155.6 kg)  10/05/21 (!) 345 lb 8 oz (156.7 kg)     Physical Exam Vitals and nursing note reviewed.  Constitutional:      General: She is not in acute distress.    Appearance: She is well-developed.  Cardiovascular:     Rate and Rhythm: Normal rate and regular rhythm.  Pulmonary:     Effort: Pulmonary effort is normal.     Breath sounds: Normal breath sounds.  Neurological:     Mental Status: She is alert and oriented to person, place, and time.       Assessment & Plan:   Type 2 diabetes mellitus without complication, without long-term current use of insulin  (HCC) -     POCT URINALYSIS DIP (CLINITEK) -     CBC -     Comprehensive metabolic panel  Essential hypertension -     POCT URINALYSIS DIP (CLINITEK) -     Furosemide ; Take 1 tablet (20 mg total) by mouth daily. TAKE 1 TABLET(20 MG) BY MOUTH DAILY  Dispense: 30 tablet; Refill: 11 -     metFORMIN  HCl; Take 1 tablet (500 mg total) by mouth 2 (two) times daily with a meal.  Dispense: 180 tablet; Refill: 3 -     CBC -     Comprehensive metabolic panel  Mixed hyperlipidemia -     Atorvastatin  Calcium ; Take 1 tablet (40 mg total) by mouth daily.  Dispense: 90 tablet; Refill: 3 -     AMB Referral VBCI Care Management  Bilateral lower extremity edema -     Furosemide ; Take 1 tablet (20 mg total) by mouth daily. TAKE 1 TABLET(20 MG) BY MOUTH DAILY  Dispense: 30 tablet; Refill: 11  Prediabetes -     metFORMIN  HCl; Take 1 tablet (500 mg total) by mouth 2 (two) times daily with a meal.  Dispense: 180 tablet; Refill: 3     Return in about 3 months (around 08/10/2023).   Bascom GORMAN Borer, NP 05/12/2023

## 2023-05-12 NOTE — Patient Instructions (Signed)
 1. Type 2 diabetes mellitus without complication, without long-term current use of insulin  (HCC) (Primary)  - POCT URINALYSIS DIP (CLINITEK) - CBC - Comprehensive metabolic panel  2. Essential hypertension  - POCT URINALYSIS DIP (CLINITEK) - furosemide  (LASIX ) 20 MG tablet; Take 1 tablet (20 mg total) by mouth daily. TAKE 1 TABLET(20 MG) BY MOUTH DAILY  Dispense: 30 tablet; Refill: 11 - metFORMIN  (GLUCOPHAGE ) 500 MG tablet; Take 1 tablet (500 mg total) by mouth 2 (two) times daily with a meal.  Dispense: 180 tablet; Refill: 3 - CBC - Comprehensive metabolic panel  3. Mixed hyperlipidemia  - atorvastatin  (LIPITOR) 40 MG tablet; Take 1 tablet (40 mg total) by mouth daily.  Dispense: 90 tablet; Refill: 3 - AMB Referral VBCI Care Management  4. Bilateral lower extremity edema  - furosemide  (LASIX ) 20 MG tablet; Take 1 tablet (20 mg total) by mouth daily. TAKE 1 TABLET(20 MG) BY MOUTH DAILY  Dispense: 30 tablet; Refill: 11  5. Prediabetes  - metFORMIN  (GLUCOPHAGE ) 500 MG tablet; Take 1 tablet (500 mg total) by mouth 2 (two) times daily with a meal.  Dispense: 180 tablet; Refill: 3  Follow up:  Follow up in 3 months

## 2023-05-13 LAB — COMPREHENSIVE METABOLIC PANEL
ALT: 24 [IU]/L (ref 0–32)
AST: 19 [IU]/L (ref 0–40)
Albumin: 4.1 g/dL (ref 3.8–4.9)
Alkaline Phosphatase: 116 [IU]/L (ref 44–121)
BUN/Creatinine Ratio: 13 (ref 9–23)
BUN: 12 mg/dL (ref 6–24)
Bilirubin Total: 0.2 mg/dL (ref 0.0–1.2)
CO2: 23 mmol/L (ref 20–29)
Calcium: 9.8 mg/dL (ref 8.7–10.2)
Chloride: 103 mmol/L (ref 96–106)
Creatinine, Ser: 0.93 mg/dL (ref 0.57–1.00)
Globulin, Total: 2.7 g/dL (ref 1.5–4.5)
Glucose: 129 mg/dL — ABNORMAL HIGH (ref 70–99)
Potassium: 4.2 mmol/L (ref 3.5–5.2)
Sodium: 142 mmol/L (ref 134–144)
Total Protein: 6.8 g/dL (ref 6.0–8.5)
eGFR: 74 mL/min/{1.73_m2} (ref >59)

## 2023-05-13 LAB — CBC
Hematocrit: 41.4 % (ref 34.0–46.6)
Hemoglobin: 13.2 g/dL (ref 11.1–15.9)
MCH: 30 pg (ref 26.6–33.0)
MCHC: 31.9 g/dL (ref 31.5–35.7)
MCV: 94 fL (ref 79–97)
Platelets: 258 10*3/uL (ref 150–450)
RBC: 4.4 x10E6/uL (ref 3.77–5.28)
RDW: 14.1 % (ref 11.7–15.4)
WBC: 7.1 10*3/uL (ref 3.4–10.8)

## 2023-05-16 ENCOUNTER — Other Ambulatory Visit (HOSPITAL_COMMUNITY): Payer: Self-pay | Admitting: Psychiatry

## 2023-05-16 DIAGNOSIS — F3181 Bipolar II disorder: Secondary | ICD-10-CM

## 2023-05-18 ENCOUNTER — Telehealth: Payer: Self-pay

## 2023-05-18 ENCOUNTER — Telehealth: Payer: Self-pay | Admitting: Pharmacist

## 2023-05-18 NOTE — Progress Notes (Addendum)
 Contacted patient regarding referral for medication access from Ivonne Andrew, NP .   Left patient a voicemail to return my call at their convenience  Catie TClearance Coots, PharmD, BCACP, CPP Clinical Pharmacist [facility] [phone]

## 2023-05-18 NOTE — Telephone Encounter (Signed)
 Good morning. Called pt to find out if she would like to have her mammo scheduled 06/14/23 . It will be preformed at [address]

## 2023-05-23 ENCOUNTER — Other Ambulatory Visit (INDEPENDENT_AMBULATORY_CARE_PROVIDER_SITE_OTHER): Payer: Self-pay | Admitting: Pharmacist

## 2023-05-23 ENCOUNTER — Other Ambulatory Visit: Payer: Self-pay

## 2023-05-23 DIAGNOSIS — F3181 Bipolar II disorder: Secondary | ICD-10-CM

## 2023-05-23 DIAGNOSIS — E782 Mixed hyperlipidemia: Secondary | ICD-10-CM

## 2023-05-23 DIAGNOSIS — I10 Essential (primary) hypertension: Secondary | ICD-10-CM

## 2023-05-23 DIAGNOSIS — R600 Localized edema: Secondary | ICD-10-CM

## 2023-05-23 DIAGNOSIS — R7303 Prediabetes: Secondary | ICD-10-CM

## 2023-05-23 MED ORDER — FUROSEMIDE 20 MG PO TABS
20.0000 mg | ORAL_TABLET | Freq: Every day | ORAL | 3 refills | Status: DC
  Filled 2023-05-23: qty 90, 90d supply, fill #0
  Filled 2023-11-07: qty 90, 90d supply, fill #1
  Filled 2024-02-05 (×2): qty 90, 90d supply, fill #2

## 2023-05-23 MED ORDER — ATORVASTATIN CALCIUM 40 MG PO TABS
40.0000 mg | ORAL_TABLET | Freq: Every day | ORAL | 3 refills | Status: DC
  Filled 2023-05-26: qty 30, 90d supply, fill #0
  Filled 2023-07-04: qty 90, 90d supply, fill #1
  Filled 2023-09-29: qty 90, 90d supply, fill #2
  Filled 2023-12-25: qty 90, 90d supply, fill #3

## 2023-05-23 MED ORDER — METFORMIN HCL 500 MG PO TABS
500.0000 mg | ORAL_TABLET | Freq: Two times a day (BID) | ORAL | 3 refills | Status: DC
  Filled 2023-05-23 (×2): qty 180, 90d supply, fill #0

## 2023-05-23 MED ORDER — GABAPENTIN 100 MG PO CAPS
100.0000 mg | ORAL_CAPSULE | Freq: Two times a day (BID) | ORAL | 3 refills | Status: DC
  Filled 2023-05-23: qty 60, 30d supply, fill #0

## 2023-05-23 NOTE — Progress Notes (Signed)
 Attempted to contact patient for scheduled appointment for medication management. Left HIPAA compliant message for patient to return my call at their convenience.   Left voicemail. Will send MyChart, though patient has not accessed MyChart since 2023

## 2023-05-23 NOTE — Progress Notes (Signed)
 05/23/2023 Name: Christie Walker MRN: [id number] DOB: 08-30-1970  Chief Complaint  Patient presents with   Medication Management   Hyperlipidemia    Christie Walker Ta is a 53 y.o. year old female who presented for a telephone visit.   They were referred to the pharmacist by their PCP for assistance in managing medication access.    Subjective:  Care Team: Primary Care Provider: Oley Bascom RAMAN, NP ; Next Scheduled Visit: 08/10/23  Medication Access/Adherence  Current Pharmacy:  [facility] [address], [address] [place] [address] Phone: [phone] Fax: [phone]   Patient reports affordability concerns with their medications: Yes  - reports atorvastatin  would periodically be too expensive at [facility] Patient reports access/transportation concerns to their pharmacy: No  Patient reports adherence concerns with their medications:  No    Notes she was previously denied for Medicaid, but she cannot remember if this was before Medicaid expansion. Already uses pharmacy at [facility] for Latuda  patient assistance   Diabetes:  Current medications: metformin  500 mg twice daily   Hyperlipidemia/ASCVD Risk Reduction  Current lipid lowering medications: atorvastatin  40 mg daily - has not been taking   Objective:  Lab Results  Component Value Date   HGBA1C 6.5 (A) 05/12/2023    Lab Results  Component Value Date   CREATININE 0.93 05/12/2023   BUN 12 05/12/2023   NA 142 05/12/2023   K 4.2 05/12/2023   CL 103 05/12/2023   CO2 23 05/12/2023    Lab Results  Component Value Date   CHOL 237 (H) 12/09/2022   HDL 47 12/09/2022   LDLCALC 139 (H) 12/09/2022   TRIG 283 (H) 12/09/2022   CHOLHDL 5.0 (H) 12/09/2022    Medications Reviewed Today     Reviewed by Christie Walker, RPH-CPP (Pharmacist) on 05/23/23 at 1349  Med List Status: <None>   Medication Order Taking? Sig  Documenting Provider Last Dose Status Informant  acetaminophen  (TYLENOL ) 325 MG tablet [id number]  Take 650 mg by mouth every 6 (six) hours as needed for mild pain or headache. [provider]  Active Self  atorvastatin  (LIPITOR) 40 MG tablet [id number]  Take 1 tablet (40 mg total) by mouth daily. Christie Bascom RAMAN, NP  Active   fluticasone  (FLONASE ) 50 MCG/ACT nasal spray [id number]  Place 2 sprays into both nostrils daily.  Patient taking differently: Place 2 sprays into both nostrils daily as needed for allergies.   Christie Toribio GAILS, MD  Active Self  furosemide  (LASIX ) 20 MG tablet [id number]  Take 1 tablet (20 mg total) by mouth daily. TAKE 1 TABLET(20 MG) BY MOUTH DAILY Nichols, Tonya S, NP  Active   gabapentin  (NEURONTIN ) 100 MG capsule [id number]  TAKE 1 CAPSULE(100 MG) BY MOUTH TWICE DAILY Parsons, Brittney E, NP  Active   ibuprofen  (ADVIL ) 800 MG tablet [id number]  Take 1 tablet (800 mg total) by mouth every 8 (eight) hours as needed.  Patient taking differently: Take 800 mg by mouth every 8 (eight) hours as needed for moderate pain (pain score 4-6).   Christie Camelia HERO, NP  Active Self  IRON PO [id number]  Take 1 tablet by mouth daily. [provider]  Active Self  Lurasidone  HCl (LATUDA ) 60 MG TABS [id number] Yes Take 1 tablet (60 mg total) by mouth daily. Christie Zane BRAVO, NP Taking Active   melatonin 5 MG TABS [id number]  Take 1 tablet (5 mg total) by mouth at bedtime as needed.  Christie Zane BRAVO, NP  Active   metFORMIN  (GLUCOPHAGE ) 500 MG tablet [id number] Yes Take 1 tablet (500 mg total) by mouth 2 (two) times daily with a meal. Christie Bascom RAMAN, NP Taking Active   traMADol  (ULTRAM ) 50 MG tablet [id number]  Take 1 tablet (50 mg total) by mouth every 12 (twelve) hours as needed. Christie Ronal CROME, PA-C  Active               Assessment/Plan:   Provided information for Medicaid enrollment team at [facility].   Diabetes: - Currently controlled -  Metformin  is on [facility] list at [facility]. Will collaborate with PCP to send refills to this pharmacy.  - Recommend to continue current regimen at this time  Hyperlipidemia/ASCVD Risk Reduction: - Currently uncontrolled.  - Atorvastatin  is on [facility] list at [facility]. Will collaborate with PCP to send refills to this pharmacy.   Patient also requests refill for gabapentin  and furosemide  to go to Pharmacy at [facility]. Will collaborate with PCP.   Follow Up Plan: follow up with PCP as scheduled  Christie Walker, PharmD, BCACP, CPP Clinical Pharmacist [facility] [phone]

## 2023-05-24 ENCOUNTER — Other Ambulatory Visit: Payer: Self-pay

## 2023-05-26 ENCOUNTER — Other Ambulatory Visit: Payer: Self-pay

## 2023-05-28 ENCOUNTER — Other Ambulatory Visit: Payer: Self-pay

## 2023-06-01 ENCOUNTER — Other Ambulatory Visit: Payer: Self-pay

## 2023-06-27 ENCOUNTER — Other Ambulatory Visit: Payer: Self-pay

## 2023-06-29 ENCOUNTER — Encounter (HOSPITAL_COMMUNITY): Payer: Self-pay | Admitting: Psychiatry

## 2023-06-29 ENCOUNTER — Other Ambulatory Visit: Payer: Self-pay

## 2023-06-29 ENCOUNTER — Telehealth (HOSPITAL_COMMUNITY): Payer: No Payment, Other | Admitting: Psychiatry

## 2023-06-29 DIAGNOSIS — F3181 Bipolar II disorder: Secondary | ICD-10-CM | POA: Diagnosis not present

## 2023-06-29 MED ORDER — MELATONIN 5 MG PO TABS
5.0000 mg | ORAL_TABLET | Freq: Every evening | ORAL | 3 refills | Status: DC | PRN
  Filled 2023-06-29: qty 60, 60d supply, fill #0

## 2023-06-29 MED ORDER — GABAPENTIN 100 MG PO CAPS
100.0000 mg | ORAL_CAPSULE | Freq: Two times a day (BID) | ORAL | 3 refills | Status: DC
Start: 2023-06-29 — End: 2023-09-21
  Filled 2023-06-29: qty 60, 30d supply, fill #0

## 2023-06-29 MED ORDER — LURASIDONE HCL 60 MG PO TABS
60.0000 mg | ORAL_TABLET | Freq: Every day | ORAL | 3 refills | Status: DC
Start: 2023-06-29 — End: 2023-09-21
  Filled 2023-08-01: qty 30, 30d supply, fill #0
  Filled 2023-08-28: qty 30, 30d supply, fill #1

## 2023-06-29 NOTE — Progress Notes (Signed)
 BH MD/PA/NP OP Progress Note Virtual Visit via Video Note  I connected with Christie Walker on 06/29/23 at  1:00 PM EST by a video enabled telemedicine application and verified that I am speaking with the correct person using two identifiers.  Location: Patient: Home Provider: Clinic   I discussed the limitations of evaluation and management by telemedicine and the availability of in person appointments. The patient expressed understanding and agreed to proceed.  I provided 30 minutes of non-face-to-face time during this encounter.        06/29/2023 1:15 PM Christie Walker  MRN:  [id number]  Chief Complaint: "Everything is good. Just work drama".   HPI:  53 year old female seen today for follow up psychiatric evaluation. She has a psychiatric history of bipolar disorder and and schizophrenia.  She is currently managed on gabapentin 100 mg twice daily, Melatonin 5 mg nightly, and Latuda 60 mg daily.  She notes her medications are effective in managing her psychiatric conditions.   Today was well-groomed, pleasant, cooperative, and engaged in conversation.  She informed Clinical research associate that everything is good but notes that there is drama on her job. She reports that she has to works nights over time as they are understaffed. She notes despite her increased work hours her sleep has not been impacted. She reports that she is sleeping 6-8 hours. Patient also reports that her mood, anxiety, and depression are still well managed. Today provider conducted a GAD-7 and patient scored a 2, at her last visit she scored a 3.  Provider also conducted PHQ-9 and patient scored a 2, at her last visit she scored a 1.  She endorses adequate appetite.  Today she denies SI/HI/VAH, mania, paranoia.   Patient informed writer that her pain has subsided since she received her cortisone injection in her knee a few months ago.   Patient informed Clinical research associate that is in need of her cholesterol medication but is  having a hard time reaching her PCP. Provider informed patient that if she can not reach her PCP that writer would do a one time fill. She endorsed understanding and agreed. She does note that her medications are more [facility]    Overall patient notes that she is doing well.  No medication changes made today.  Patient agreeable to continue medications as prescribed.  No other concerns noted at this time.     Visit Diagnosis:    ICD-10-CM   1. Bipolar 2 disorder (HCC)  F31.81 gabapentin (NEURONTIN) 100 MG capsule    Lurasidone HCl (LATUDA) 60 MG TABS    melatonin 5 MG TABS         Past Psychiatric History:  bipolar disorder and and schizophrenia  Past Medical History:  Past Medical History:  Diagnosis Date   Acid reflux    Bipolar 1 disorder (HCC)    Essential hypertension 06/14/2016   No past surgical history on file.  Family Psychiatric History: Paternal aunts bipolar disorder and father has mental health conditions but unaware which one.   Family History:  Family History  Problem Relation Age of Onset   Mental illness Other     Social History:  Social History   Socioeconomic History   Marital status: Legally Separated    Spouse name: Not on file   Number of children: Not on file   Years of education: Not on file   Highest education level: Not on file  Occupational History   Not on  file  Tobacco Use   Smoking status: Never   Smokeless tobacco: Never  Vaping Use   Vaping status: Never Used  Substance and Sexual Activity   Alcohol use: No   Drug use: No   Sexual activity: Not Currently    Birth control/protection: None  Other Topics Concern   Not on file  Social History Narrative   Not on file   Social Drivers of Health   Financial Resource Strain: Not on file  Food Insecurity: Not on file  Transportation Needs: Not on file  Physical Activity: Not on file  Stress: Not on file  Social Connections:  Not on file    Allergies:  Allergies  Allergen Reactions   Bee Venom Swelling    Swelling at site    Amoxicillin Hives and Other (See Comments)    Patient states that she can take this she just has a sensitivity when its taken along with pain medication   Hydrocodone Hives and Other (See Comments)    Sensitivity when taken along with antibiotics like amoxicillin per patient   Insect Extract Other (See Comments)    Spiders - make tongue burn   Propoxyphene N-Acetaminophen Nausea And Vomiting and Rash    ** Darvacet    Metabolic Disorder Labs: Lab Results  Component Value Date   HGBA1C 6.5 (A) 05/12/2023   MPG 139.85 08/06/2019   MPG 128.37 08/01/2019   No results found for: "PROLACTIN" Lab Results  Component Value Date   CHOL 237 (H) 12/09/2022   TRIG 283 (H) 12/09/2022   HDL 47 12/09/2022   CHOLHDL 5.0 (H) 12/09/2022   VLDL 32 (H) 06/14/2016   LDLCALC 139 (H) 12/09/2022   LDLCALC 154 (H) 09/24/2021   Lab Results  Component Value Date   TSH 2.660 12/09/2022   TSH 1.080 10/04/2019    Therapeutic Level Labs: No results found for: "LITHIUM" No results found for: "VALPROATE" No results found for: "CBMZ"  Current Medications: Current Outpatient Medications  Medication Sig Dispense Refill   acetaminophen (TYLENOL) 325 MG tablet Take 650 mg by mouth every 6 (six) hours as needed for mild pain or headache.     atorvastatin (LIPITOR) 40 MG tablet Take 1 tablet (40 mg total) by mouth daily. 90 tablet 3   fluticasone (FLONASE) 50 MCG/ACT nasal spray Place 2 sprays into both nostrils daily. (Patient taking differently: Place 2 sprays into both nostrils daily as needed for allergies.) 9.9 g 0   furosemide (LASIX) 20 MG tablet Take 1 tablet (20 mg total) by mouth daily. 90 tablet 3   gabapentin (NEURONTIN) 100 MG capsule Take 1 capsule (100 mg total) by mouth 2 (two) times daily. 60 capsule 3   ibuprofen (ADVIL) 800 MG tablet Take 1 tablet (800 mg total) by mouth every 8  (eight) hours as needed. (Patient taking differently: Take 800 mg by mouth every 8 (eight) hours as needed for moderate pain (pain score 4-6).) 30 tablet 2   IRON PO Take 1 tablet by mouth daily.     Lurasidone HCl (LATUDA) 60 MG TABS Take 1 tablet (60 mg total) by mouth daily. 30 tablet 3   melatonin 5 MG TABS Take 1 tablet (5 mg total) by mouth at bedtime as needed. 60 tablet 3   metFORMIN (GLUCOPHAGE) 500 MG tablet Take 1 tablet (500 mg total) by mouth 2 (two) times daily with a meal. 180 tablet 3   traMADol (ULTRAM) 50 MG tablet Take 1 tablet (50 mg total) by mouth  every 12 (twelve) hours as needed. 30 tablet 1   No current facility-administered medications for this visit.     Musculoskeletal: Strength & Muscle Tone: within normal limits and telehealth visit Gait & Station: normal, Telehealth visit Patient leans: N/A  Psychiatric Specialty Exam: Review of Systems  There were no vitals taken for this visit.There is no height or weight on file to calculate BMI.  General Appearance: Well Groomed  Eye Contact:  Good  Speech:  Clear and Coherent and Normal Rate  Volume:  Normal  Mood:  Euthymic  Affect:  Appropriate and Congruent  Thought Process:  Coherent, Goal Directed and Linear  Orientation:  Full (Time, Place, and Person)  Thought Content: WDL and Logical   Suicidal Thoughts:  No  Homicidal Thoughts:  No  Memory:  Immediate;   Good Recent;   Good Remote;   Good  Judgement:  Good  Insight:  Good  Psychomotor Activity:  Normal  Concentration:  Concentration: Good and Attention Span: Good  Recall:  Good  Fund of Knowledge: Good  Language: Good  Akathisia:  No  Handed:  Right  AIMS (if indicated): not done  Assets:  Communication Skills Desire for Improvement Financial Resources/Insurance Housing Social Support  ADL's:  Intact  Cognition: WNL  Sleep:  Good   Screenings: GAD-7    Flowsheet Row [facility]29[facility]2023 [facility]22/2022 in [facility] Video Visit from 09/28/2022 in [facility] Video Visit from 07/25/2022 in [facility]  Total GAD-7 Score 2 3 2 6 3       PHQ2-9    Flowsheet Row Video Visit from 06/29/2023 in [facility] Clinical Support from 04/20/2023 in [facility] Clinical Support from 12/22/2022 in [facility] Office Visit from 11[facility]11/2022 in [facility] Video Visit from 09/28/2022 in [facility]  PHQ-2 Total Score 0 0 1 0 0  PHQ-9 Total Score 2 1 1  0 1      Flowsheet Row ED from 04/26/2022 in [facility] Emergency Department at [facility] ED from 11/24/2021 in [facility] Emergency Department at [facility] Video Visit from 08/21/2020 in [facility]  C-SSRS RISK CATEGORY No Risk No Risk No Risk        Assessment and Plan: Patient informed writer that she is doing well on her current medication regimen.  No medication changes made today.  Patient agreeable to continue medications as prescribed.     1. Bipolar 2 disorder (HCC)  Continue- gabapentin (NEURONTIN) 100 MG capsule; Take 1 capsule (100 mg total) by mouth 2 (two) times daily.  Dispense: 60 capsule; Refill: 3 Continue- Lurasidone HCl (LATUDA) 60 MG TABS; Take 1 tablet (60 mg total) by mouth daily.  Dispense: 30 tablet; Refill: 3 Continue- melatonin 5 MG TABS; Take 1 tablet (5 mg total) by mouth at bedtime as needed.  Dispense: 60 tablet; Refill: 3   Follow-up in 3 months  Shanna Cisco, NP  06/29/2023, 1:15 PM

## 2023-07-04 ENCOUNTER — Other Ambulatory Visit: Payer: Self-pay

## 2023-07-07 ENCOUNTER — Other Ambulatory Visit: Payer: Self-pay

## 2023-07-25 ENCOUNTER — Encounter: Payer: Self-pay | Admitting: Physician Assistant

## 2023-07-25 ENCOUNTER — Ambulatory Visit (INDEPENDENT_AMBULATORY_CARE_PROVIDER_SITE_OTHER): Payer: Self-pay | Admitting: Physician Assistant

## 2023-07-25 VITALS — Ht 64.0 in | Wt 361.0 lb

## 2023-07-25 DIAGNOSIS — M1711 Unilateral primary osteoarthritis, right knee: Secondary | ICD-10-CM

## 2023-07-25 MED ORDER — METHYLPREDNISOLONE ACETATE 40 MG/ML IJ SUSP
40.0000 mg | INTRAMUSCULAR | Status: AC | PRN
Start: 2023-07-25 — End: 2023-07-25
  Administered 2023-07-25: 40 mg via INTRA_ARTICULAR

## 2023-07-25 MED ORDER — BUPIVACAINE HCL 0.25 % IJ SOLN
2.0000 mL | INTRAMUSCULAR | Status: AC | PRN
  Administered 2023-07-25: 2 mL via INTRA_ARTICULAR

## 2023-07-25 MED ORDER — LIDOCAINE HCL 1 % IJ SOLN
2.0000 mL | INTRAMUSCULAR | Status: AC | PRN
Start: 2023-07-25 — End: 2023-07-25
  Administered 2023-07-25: 2 mL

## 2023-07-25 NOTE — Progress Notes (Signed)
 Office Visit Note   Patient: Christie Walker           Date of Birth: [birth year]           MRN: [id number] Visit Date: 07/25/2023              Requested by: Ivonne Andrew, NP [phone] [address] [place],  [place] [address] PCP: Ivonne Andrew, NP   Assessment & Plan: Visit Diagnoses:  1. Unilateral primary osteoarthritis, right knee     Plan: Impression is right knee osteoarthritis flare.  Today, we discussed repeat cortisone injection for which she would like to proceed.  Continue to work on weight loss.  She will follow-up with [place] as needed.  Follow-Up Instructions: Return if symptoms worsen or fail to improve.   Orders:  No orders of the defined types were placed in this encounter.  No orders of the defined types were placed in this encounter.     Procedures: Large Joint Inj: R knee on 07/25/2023 8:52 AM Indications: pain Details: 22 G needle, anterolateral approach Medications: 2 mL lidocaine 1 %; 2 mL bupivacaine 0.25 %; 40 mg methylPREDNISolone acetate 40 MG/ML      Clinical Data: No additional findings.   Subjective: Chief Complaint  Patient presents with   Right Knee - Pain    HPI patient is a pleasant 53 year old female with underlying right knee osteoarthritis who comes in today with recurrent right knee pain.  Symptoms began a few years ago.  She was seen in our office back in November 2024 where cortisone injection was performed.  She had great relief until recently.  Symptoms have gradually started to worsen.  Most of the pain is to the medial aspect.  Pain is worse with activity.  She is requesting repeat cortisone injection today.  Review of Systems as detailed in HPI.  All others reviewed and are negative.   Objective: Vital Signs: Ht 5\' 4"  (1.626 m)   Wt (!) 361 lb (163.7 kg)   BMI 61.97 kg/m   Physical Exam well-developed and well-nourished female in no acute distress.  Alert and oriented x 3.  Ortho Exam right knee exam:  Range of motion 0 to 95 degrees.  Medial joint line tenderness.  Mild patellofemoral crepitus.  She is neurovascularly intact distally.  Specialty Comments:  No specialty comments available.  Imaging: No new imaging   PMFS History: Patient Active Problem List   Diagnosis Date Noted   De Quervain's tenosynovitis, right 10/05/2021   Bipolar 2 disorder (HCC)    Type 2 diabetes mellitus without complication, without long-term current use of insulin (HCC)    Obesity, Class III, BMI 40-49.9 (morbid obesity) (HCC)    Pneumonia due to COVID-19 virus 07/31/2019   SIRS (systemic inflammatory response syndrome) (HCC) 07/31/2019   Chest tightness 07/31/2019   Chest pain at rest 08/05/2016   Morbid obesity with BMI of 50.0-59.9, adult (HCC) 08/05/2016   Dyspnea on exertion 08/05/2016   HTN (hypertension) 06/14/2016   Metabolic syndrome 06/14/2016   Chronic knee pain 06/14/2016   OBESITY 03/08/2007   Temporomandibular joint disorder 03/08/2007   GERD 03/08/2007   Past Medical History:  Diagnosis Date   Acid reflux    Bipolar 1 disorder (HCC)    Essential hypertension 06/14/2016    Family History  Problem Relation Age of Onset   Mental illness Other     History reviewed. No pertinent surgical history. Social History   Occupational History  Not on file  Tobacco Use   Smoking status: Never   Smokeless tobacco: Never  Vaping Use   Vaping status: Never Used  Substance and Sexual Activity   Alcohol use: No   Drug use: No   Sexual activity: Not Currently    Birth control/protection: None

## 2023-08-01 ENCOUNTER — Other Ambulatory Visit: Payer: Self-pay

## 2023-08-10 ENCOUNTER — Ambulatory Visit: Payer: Self-pay | Admitting: Nurse Practitioner

## 2023-08-28 ENCOUNTER — Other Ambulatory Visit: Payer: Self-pay

## 2023-09-01 ENCOUNTER — Other Ambulatory Visit: Payer: Self-pay

## 2023-09-21 ENCOUNTER — Other Ambulatory Visit: Payer: Self-pay | Admitting: Physician Assistant

## 2023-09-21 ENCOUNTER — Other Ambulatory Visit: Payer: Self-pay

## 2023-09-21 ENCOUNTER — Encounter (HOSPITAL_COMMUNITY): Payer: Self-pay | Admitting: Psychiatry

## 2023-09-21 ENCOUNTER — Telehealth (HOSPITAL_COMMUNITY): Payer: No Payment, Other | Admitting: Psychiatry

## 2023-09-21 ENCOUNTER — Telehealth: Payer: Self-pay | Admitting: Physician Assistant

## 2023-09-21 ENCOUNTER — Telehealth (HOSPITAL_COMMUNITY): Admitting: Psychiatry

## 2023-09-21 DIAGNOSIS — F3181 Bipolar II disorder: Secondary | ICD-10-CM | POA: Diagnosis not present

## 2023-09-21 MED ORDER — MELATONIN 5 MG PO TABS
5.0000 mg | ORAL_TABLET | Freq: Every evening | ORAL | 3 refills | Status: DC | PRN
  Filled 2023-09-21: qty 60, 60d supply, fill #0

## 2023-09-21 MED ORDER — TRAMADOL HCL 50 MG PO TABS
50.0000 mg | ORAL_TABLET | Freq: Two times a day (BID) | ORAL | 1 refills | Status: DC | PRN
  Filled 2023-09-21: qty 30, 15d supply, fill #0
  Filled 2024-01-16: qty 30, 15d supply, fill #1

## 2023-09-21 MED ORDER — LURASIDONE HCL 60 MG PO TABS
60.0000 mg | ORAL_TABLET | Freq: Every day | ORAL | 3 refills | Status: DC
  Filled 2023-09-21 – 2023-09-29 (×2): qty 30, 30d supply, fill #0
  Filled 2023-10-30 (×2): qty 30, 30d supply, fill #1
  Filled 2023-11-29: qty 30, 30d supply, fill #2

## 2023-09-21 MED ORDER — GABAPENTIN 100 MG PO CAPS
100.0000 mg | ORAL_CAPSULE | Freq: Two times a day (BID) | ORAL | 3 refills | Status: DC
  Filled 2023-09-21: qty 60, 30d supply, fill #0

## 2023-09-21 NOTE — Progress Notes (Signed)
 BH MD/PA/NP OP Progress Note Virtual Visit via Video Note  I connected with Christie Walker on 09/21/23 at 10:00 AM EDT by a video enabled telemedicine application and verified that I am speaking with the correct person using two identifiers.  Location: Patient: Home Provider: Clinic   I discussed the limitations of evaluation and management by telemedicine and the availability of in person appointments. The patient expressed understanding and agreed to proceed.  I provided 30 minutes of non-face-to-face time during this encounter.        09/21/2023 10:16 AM Christie Walker  MRN:  [id number]  Chief Complaint: "I have bump under my tongue".   HPI:  53 year old female seen today for follow up psychiatric evaluation. She has a psychiatric history of bipolar disorder and and schizophrenia.  She is currently managed on gabapentin  100 mg twice daily, Melatonin 5 mg nightly, and Latuda  60 mg daily.  She notes her medications are effective in managing her psychiatric conditions.   Today was well-groomed, pleasant, cooperative, and engaged in conversation.  She informed Clinical research associate that recently she developed a painful bump under her tongue. She notes that that the pain subsided after being prayed for at church.  She however notes that she continues to have the bump.  She does follow-up with her primary care doctor later on this month.    Since last visit she reports that she has switched shifts at work.  She notes that she now works from Friday through Tuesday to find enjoyment in her job.  She notes that her job is less stressful/less drauma field.  Patient inform her that her mood, anxiety, and depression are well-managed. Today provider conducted a GAD-7 and patient scored a 1, at her last visit she scored a 2.  Provider also conducted PHQ-9 and patient scored a 3, at her last visit she scored a 2.  She endorses adequate appetite.  Today she denies SI/HI/VAH, mania, paranoia.   Patient  notes that she plans to spend the day with her brother by taking him to the barbershop.  She also notes that she took the dog [name] to an appointment.  Overall patient notes that she is doing well.  No medication changes made today.  Patient agreeable to continue medications as prescribed.  No other concerns noted at this time.     Visit Diagnosis:    ICD-10-CM   1. Bipolar 2 disorder (HCC)  F31.81 gabapentin  (NEURONTIN ) 100 MG capsule    melatonin 5 MG TABS    Lurasidone  HCl (LATUDA ) 60 MG TABS          Past Psychiatric History:  bipolar disorder and and schizophrenia  Past Medical History:  Past Medical History:  Diagnosis Date   Acid reflux    Bipolar 1 disorder (HCC)    Essential hypertension 06/14/2016   History reviewed. No pertinent surgical history.  Family Psychiatric History: Paternal aunts bipolar disorder and father has mental health conditions but unaware which one.   Family History:  Family History  Problem Relation Age of Onset   Mental illness Other     Social History:  Social History   Socioeconomic History   Marital status: Legally Separated    Spouse name: Not on file   Number of children: Not on file   Years of education: Not on file   Highest education level: Not on file  Occupational History   Not on file  Tobacco Use   Smoking status: Never   Smokeless tobacco: Never  Vaping Use   Vaping status: Never Used  Substance and Sexual Activity   Alcohol use: No   Drug use: No   Sexual activity: Not Currently    Birth control/protection: None  Other Topics Concern   Not on file  Social History Narrative   Not on file   Social Drivers of Health   Financial Resource Strain: Not on file  Food Insecurity: Not on file  Transportation Needs: Not on file  Physical Activity: Not on file  Stress: Not on file  Social Connections: Not on file    Allergies:  Allergies  Allergen Reactions   Bee Venom Swelling    Swelling at site     Amoxicillin Hives and Other (See Comments)    Patient states that she can take this she just has a sensitivity when its taken along with pain medication   Hydrocodone  Hives and Other (See Comments)    Sensitivity when taken along with antibiotics like amoxicillin per patient   Insect Extract Other (See Comments)    Spiders - make tongue burn   Propoxyphene N-Acetaminophen  Nausea And Vomiting and Rash    ** Darvacet    Metabolic Disorder Labs: Lab Results  Component Value Date   HGBA1C 6.5 (A) 05/12/2023   MPG 139.85 08/06/2019   MPG 128.37 08/01/2019   No results found for: "PROLACTIN" Lab Results  Component Value Date   CHOL 237 (H) 12/09/2022   TRIG 283 (H) 12/09/2022   HDL 47 12/09/2022   CHOLHDL 5.0 (H) 12/09/2022   VLDL 32 (H) 06/14/2016   LDLCALC 139 (H) 12/09/2022   LDLCALC 154 (H) 09/24/2021   Lab Results  Component Value Date   TSH 2.660 12/09/2022   TSH 1.080 10/04/2019    Therapeutic Level Labs: No results found for: "LITHIUM" No results found for: "VALPROATE" No results found for: "CBMZ"  Current Medications: Current Outpatient Medications  Medication Sig Dispense Refill   acetaminophen  (TYLENOL ) 325 MG tablet Take 650 mg by mouth every 6 (six) hours as needed for mild pain or headache.     atorvastatin  (LIPITOR) 40 MG tablet Take 1 tablet (40 mg total) by mouth daily. 90 tablet 3   fluticasone  (FLONASE ) 50 MCG/ACT nasal spray Place 2 sprays into both nostrils daily. (Patient taking differently: Place 2 sprays into both nostrils daily as needed for allergies.) 9.9 g 0   furosemide  (LASIX ) 20 MG tablet Take 1 tablet (20 mg total) by mouth daily. 90 tablet 3   gabapentin  (NEURONTIN ) 100 MG capsule Take 1 capsule (100 mg total) by mouth 2 (two) times daily. 60 capsule 3   ibuprofen  (ADVIL ) 800 MG tablet Take 1 tablet (800 mg total) by mouth every 8 (eight) hours as needed. (Patient taking differently: Take 800 mg by mouth every 8 (eight) hours as needed for  moderate pain (pain score 4-6).) 30 tablet 2   IRON PO Take 1 tablet by mouth daily.     Lurasidone  HCl (LATUDA ) 60 MG TABS Take 1 tablet (60 mg total) by mouth daily. 30 tablet 3   melatonin 5 MG TABS Take 1 tablet (5 mg total) by mouth at bedtime as needed. 60 tablet 3   metFORMIN  (GLUCOPHAGE ) 500 MG tablet Take 1 tablet (500 mg total) by mouth 2 (two) times daily with a meal. 180 tablet 3   traMADol  (ULTRAM ) 50 MG tablet Take 1 tablet (50 mg total) by mouth every 12 (twelve) hours as needed. 30 tablet 1   No current facility-administered medications  for this visit.     Musculoskeletal: Strength & Muscle Tone: within normal limits and telehealth visit Gait & Station: normal, Telehealth visit Patient leans: N/A  Psychiatric Specialty Exam: Review of Systems  There were no vitals taken for this visit.There is no height or weight on file to calculate BMI.  General Appearance: Well Groomed  Eye Contact:  Good  Speech:  Clear and Coherent and Normal Rate  Volume:  Normal  Mood:  Euthymic  Affect:  Appropriate and Congruent  Thought Process:  Coherent, Goal Directed and Linear  Orientation:  Full (Time, Place, and Person)  Thought Content: WDL and Logical   Suicidal Thoughts:  No  Homicidal Thoughts:  No  Memory:  Immediate;   Good Recent;   Good Remote;   Good  Judgement:  Good  Insight:  Good  Psychomotor Activity:  Normal  Concentration:  Concentration: Good and Attention Span: Good  Recall:  Good  Fund of Knowledge: Good  Language: Good  Akathisia:  No  Handed:  Right  AIMS (if indicated): not done  Assets:  Communication Skills Desire for Improvement Financial Resources/Insurance Housing Social Support  ADL's:  Intact  Cognition: WNL  Sleep:  Good   Screenings: GAD-7    Flowsheet Row Video Visit from 09/21/2023 in [facility] Video Visit from 06/29/2023 in [facility] Clinical Support from 04/20/2023 in  [facility] Clinical Support from 12/22/2022 in [facility] Video Visit from 09/28/2022 in [facility]  Total GAD-7 Score 1 2 3 2 6       PHQ2-9    Flowsheet Row Video Visit from 09/21/2023 in [facility] Video Visit from 06/29/2023 in [facility] Clinical Support from 04/20/2023 in [facility] Clinical Support from 12/22/2022 in [facility] Office Visit from 11/11/2022 in [facility]  PHQ-2 Total Score 0 0 0 1 0  PHQ-9 Total Score 3 2 1 1  0      Flowsheet Row ED from 04/26/2022 in [facility] Emergency Department at [facility] ED from 11/24/2021 in [facility] Emergency Department at [facility] Video Visit from 08/21/2020 in [facility]  C-SSRS RISK CATEGORY No Risk No Risk No Risk        Assessment and Plan: Patient informed writer that she is doing well on her current medication regimen.  No medication changes made today.  Patient agreeable to continue medications as prescribed.     1. Bipolar 2 disorder (HCC)  Continue- gabapentin  (NEURONTIN ) 100 MG capsule; Take 1 capsule (100 mg total) by mouth 2 (two) times daily.  Dispense: 60 capsule; Refill: 3 Continue- Lurasidone  HCl (LATUDA ) 60 MG TABS; Take 1 tablet (60 mg total) by mouth daily.  Dispense: 30 tablet; Refill: 3 Continue- melatonin 5 MG TABS; Take 1 tablet (5 mg total) by mouth at bedtime as needed.  Dispense: 60 tablet; Refill: 3   Follow-up in 3 months  Arlyne Bering, NP  09/21/2023, 10:16 AM

## 2023-09-21 NOTE — Telephone Encounter (Signed)
Sent tramadol

## 2023-09-21 NOTE — Telephone Encounter (Signed)
 Pt called requesting refill of tramadol . Please send to [facility] on [address]. Pt phone number is [phone].

## 2023-09-29 ENCOUNTER — Other Ambulatory Visit: Payer: Self-pay

## 2023-10-05 ENCOUNTER — Other Ambulatory Visit: Payer: Self-pay

## 2023-10-05 ENCOUNTER — Ambulatory Visit (INDEPENDENT_AMBULATORY_CARE_PROVIDER_SITE_OTHER): Payer: Self-pay | Admitting: Nurse Practitioner

## 2023-10-05 ENCOUNTER — Encounter: Payer: Self-pay | Admitting: Nurse Practitioner

## 2023-10-05 VITALS — BP 108/74 | HR 86 | Temp 98.4°F | Wt 354.0 lb

## 2023-10-05 DIAGNOSIS — Z1211 Encounter for screening for malignant neoplasm of colon: Secondary | ICD-10-CM | POA: Diagnosis not present

## 2023-10-05 DIAGNOSIS — E119 Type 2 diabetes mellitus without complications: Secondary | ICD-10-CM | POA: Diagnosis not present

## 2023-10-05 DIAGNOSIS — E782 Mixed hyperlipidemia: Secondary | ICD-10-CM | POA: Diagnosis not present

## 2023-10-05 NOTE — Patient Instructions (Signed)
 1. Type 2 diabetes mellitus without complication, without long-term current use of insulin  (HCC)  - Urine Albumin/Creatinine with ratio (send out) [LAB689] - Lipid Panel - CBC - Comprehensive metabolic panel with GFR - Hemoglobin A1c  2. Screen for colon cancer (Primary)  - Cologuard  3. Mixed hyperlipidemia  - Lipid Panel - AMB Referral [facility] Care Management

## 2023-10-05 NOTE — Progress Notes (Signed)
 Subjective   Patient ID: Christie Walker, female    DOB: 04/18/1971, 53 y.o.   MRN: [id number]  Chief Complaint  Patient presents with   Mass    Bump under tongue     Referring provider: Jerrlyn Morel, NP  Christie Walker is a 53 y.o. female with Past Medical History: No date: Acid reflux No date: Bipolar 1 disorder (HCC) 06/14/2016: Essential hypertension   HPI  Patient presents today for a follow-up visit.  She is compliant with medications.  Her A1c at last visit was 6.5. Stable from last visit.  Does need labs today. Denies f/c/s, n/v/d, hemoptysis, PND, leg swelling. Denies chest pain or edema.   Note: Patient originally scheduled her appointment for mass under tongue, but this has resolved.      Allergies  Allergen Reactions   Bee Venom Swelling    Swelling at site    Amoxicillin Hives and Other (See Comments)    Patient states that she can take this she just has a sensitivity when its taken along with pain medication   Hydrocodone  Hives and Other (See Comments)    Sensitivity when taken along with antibiotics like amoxicillin per patient   Insect Extract Other (See Comments)    Spiders - make tongue burn   Propoxyphene N-Acetaminophen  Nausea And Vomiting and Rash    ** Darvacet    Immunization History  Administered Date(s) Administered   Moderna Covid-19 Vaccine Bivalent Booster 51yrs & up 02/23/2021   Tdap 08/04/2016    Tobacco History: Social History   Tobacco Use  Smoking Status Never  Smokeless Tobacco Never   Counseling given: Not Answered   Outpatient Encounter Medications as of 10/05/2023  Medication Sig   acetaminophen  (TYLENOL ) 325 MG tablet Take 650 mg by mouth every 6 (six) hours as needed for mild pain or headache.   atorvastatin  (LIPITOR) 40 MG tablet Take 1 tablet (40 mg total) by mouth daily.   fluticasone  (FLONASE ) 50 MCG/ACT nasal spray Place 2 sprays into both nostrils daily. (Patient taking differently: Place 2  sprays into both nostrils daily as needed for allergies.)   furosemide  (LASIX ) 20 MG tablet Take 1 tablet (20 mg total) by mouth daily.   gabapentin  (NEURONTIN ) 100 MG capsule Take 1 capsule (100 mg total) by mouth 2 (two) times daily.   ibuprofen  (ADVIL ) 800 MG tablet Take 1 tablet (800 mg total) by mouth every 8 (eight) hours as needed. (Patient taking differently: Take 800 mg by mouth every 8 (eight) hours as needed for moderate pain (pain score 4-6).)   IRON PO Take 1 tablet by mouth daily.   Lurasidone  HCl (LATUDA ) 60 MG TABS Take 1 tablet (60 mg total) by mouth daily.   melatonin 5 MG TABS Take 1 tablet (5 mg total) by mouth at bedtime as needed.   metFORMIN  (GLUCOPHAGE ) 500 MG tablet Take 1 tablet (500 mg total) by mouth 2 (two) times daily with a meal.   traMADol  (ULTRAM ) 50 MG tablet Take 1 tablet (50 mg total) by mouth every 12 (twelve) hours as needed.   No facility-administered encounter medications on file as of 10/05/2023.    Review of Systems  Review of Systems  Constitutional: Negative.   HENT: Negative.    Cardiovascular: Negative.   Gastrointestinal: Negative.   Allergic/Immunologic: Negative.   Neurological: Negative.   Psychiatric/Behavioral: Negative.       Objective:   BP 108/74   Pulse 86   Temp 98.4 F (36.9 C) (  Oral)   Wt (!) 354 lb (160.6 kg)   SpO2 97%   BMI 60.76 kg/m   Wt Readings from Last 5 Encounters:  10/05/23 (!) 354 lb (160.6 kg)  07/25/23 (!) 361 lb (163.7 kg)  05/12/23 (!) 356 lb 3.2 oz (161.6 kg)  03/21/23 (!) 360 lb (163.3 kg)  11/11/22 (!) 355 lb (161 kg)     Physical Exam Vitals and nursing note reviewed.  Constitutional:      General: She is not in acute distress.    Appearance: She is well-developed.  Cardiovascular:     Rate and Rhythm: Normal rate and regular rhythm.  Pulmonary:     Effort: Pulmonary effort is normal.     Breath sounds: Normal breath sounds.  Neurological:     Mental Status: She is alert and oriented  to person, place, and time.       Assessment & Plan:   Screen for colon cancer -     Cologuard  Type 2 diabetes mellitus without complication, without long-term current use of insulin  (HCC) -     Microalbumin / creatinine urine ratio -     Lipid panel -     CBC -     Comprehensive metabolic panel with GFR -     Hemoglobin A1c  Mixed hyperlipidemia -     Lipid panel -     AMB Referral [facility]BCI Care Management     Return in about 3 months (around 01/05/2024).   Jerrlyn Morel, NP 10/05/2023

## 2023-10-06 LAB — COMPREHENSIVE METABOLIC PANEL WITH GFR
ALT: 19 IU/L (ref 0–32)
AST: 16 IU/L (ref 0–40)
Albumin: 3.9 g/dL (ref 3.8–4.9)
Alkaline Phosphatase: 154 IU/L — ABNORMAL HIGH (ref 44–121)
BUN/Creatinine Ratio: 11 (ref 9–23)
BUN: 9 mg/dL (ref 6–24)
Bilirubin Total: 0.3 mg/dL (ref 0.0–1.2)
CO2: 22 mmol/L (ref 20–29)
Calcium: 9.8 mg/dL (ref 8.7–10.2)
Chloride: 103 mmol/L (ref 96–106)
Creatinine, Ser: 0.84 mg/dL (ref 0.57–1.00)
Globulin, Total: 2.5 g/dL (ref 1.5–4.5)
Glucose: 125 mg/dL — ABNORMAL HIGH (ref 70–99)
Potassium: 4.6 mmol/L (ref 3.5–5.2)
Sodium: 142 mmol/L (ref 134–144)
Total Protein: 6.4 g/dL (ref 6.0–8.5)
eGFR: 84 mL/min/{1.73_m2} (ref >59)

## 2023-10-06 LAB — LIPID PANEL
Chol/HDL Ratio: 3.5 ratio (ref 0.0–4.4)
Cholesterol, Total: 145 mg/dL (ref 100–199)
HDL: 42 mg/dL (ref >39)
LDL Chol Calc (NIH): 80 mg/dL (ref 0–99)
Triglycerides: 131 mg/dL (ref 0–149)
VLDL Cholesterol Cal: 23 mg/dL (ref 5–40)

## 2023-10-06 LAB — CBC
Hematocrit: 43.1 % (ref 34.0–46.6)
Hemoglobin: 13.6 g/dL (ref 11.1–15.9)
MCH: 29.2 pg (ref 26.6–33.0)
MCHC: 31.6 g/dL (ref 31.5–35.7)
MCV: 93 fL (ref 79–97)
Platelets: 254 10*3/uL (ref 150–450)
RBC: 4.65 x10E6/uL (ref 3.77–5.28)
RDW: 14.1 % (ref 11.7–15.4)
WBC: 6.6 10*3/uL (ref 3.4–10.8)

## 2023-10-06 LAB — HEMOGLOBIN A1C
Est. average glucose Bld gHb Est-mCnc: 154 mg/dL
Hgb A1c MFr Bld: 7 % — ABNORMAL HIGH (ref 4.8–5.6)

## 2023-10-06 LAB — MICROALBUMIN / CREATININE URINE RATIO
Creatinine, Urine: 203.4 mg/dL
Microalb/Creat Ratio: 6 mg/g{creat} (ref 0–29)
Microalbumin, Urine: 12.7 ug/mL

## 2023-10-09 ENCOUNTER — Ambulatory Visit: Payer: Self-pay | Admitting: Nurse Practitioner

## 2023-10-16 ENCOUNTER — Telehealth: Payer: Self-pay | Admitting: *Deleted

## 2023-10-16 NOTE — Progress Notes (Unsigned)
 Care Guide Pharmacy Note  10/16/2023 Name: Christie Walker MRN: [id number] DOB: Sep 15, 1970  Referred By: Jerrlyn Morel, NP Reason for referral: Complex Care Management (Initial outreach to schedule referral with [facility] )   Christie Walker is a 53 y.o. year old female who is a primary care patient of Jerrlyn Morel, NP.  Christie Walker was referred to the pharmacist for assistance related to: HLD  An unsuccessful telephone outreach was attempted today to contact the patient who was referred to the pharmacy team for assistance with medication management. Additional attempts will be made to contact the patient.  Christie Walker  [facility]  [facility], [facility] Guide  Direct Dial: [phone]  Fax [phone]

## 2023-10-17 NOTE — Progress Notes (Signed)
 Care Guide Pharmacy Note  10/17/2023 Name: Elonna Mcfarlane MRN: [id number] DOB: 03/03/1971  Referred By: Jerrlyn Morel, NP Reason for referral: Complex Care Management (Initial outreach to schedule referral with [facility] )   Elaya Droege Trudel is a 53 y.o. year old female who is a primary care patient of Jerrlyn Morel, NP.  Suzen Escort Raisanen was referred to the pharmacist for assistance related to: HLD  Successful contact was made with the patient to discuss pharmacy services including being ready for the pharmacist to call at least 5 minutes before the scheduled appointment time and to have medication bottles and any blood pressure readings ready for review. The patient agreed to meet with the pharmacist via telephone visit on (date/time).11/28/23 at 1:30 PM   Barnie Bora  [facility], [facility] Guide  Direct Dial: [phone]  Fax [phone]

## 2023-10-19 ENCOUNTER — Other Ambulatory Visit: Payer: Self-pay

## 2023-10-30 ENCOUNTER — Other Ambulatory Visit: Payer: Self-pay

## 2023-10-31 ENCOUNTER — Other Ambulatory Visit: Payer: Self-pay

## 2023-11-02 ENCOUNTER — Other Ambulatory Visit: Payer: Self-pay

## 2023-11-07 ENCOUNTER — Other Ambulatory Visit: Payer: Self-pay

## 2023-11-09 ENCOUNTER — Other Ambulatory Visit: Payer: Self-pay

## 2023-11-28 ENCOUNTER — Other Ambulatory Visit (HOSPITAL_COMMUNITY): Payer: Self-pay

## 2023-11-28 ENCOUNTER — Other Ambulatory Visit (INDEPENDENT_AMBULATORY_CARE_PROVIDER_SITE_OTHER): Payer: Self-pay

## 2023-11-28 ENCOUNTER — Other Ambulatory Visit: Payer: Self-pay

## 2023-11-28 DIAGNOSIS — E119 Type 2 diabetes mellitus without complications: Secondary | ICD-10-CM

## 2023-11-28 MED ORDER — TRULICITY 0.75 MG/0.5ML ~~LOC~~ SOAJ
0.7500 mg | SUBCUTANEOUS | 1 refills | Status: DC
  Filled 2023-11-28: qty 2, 28d supply, fill #0
  Filled 2023-12-25 – 2023-12-28 (×3): qty 2, 28d supply, fill #1

## 2023-11-28 NOTE — Progress Notes (Signed)
 11/28/2023 Name: Christie Walker MRN: [id number] DOB: 1970-06-10  Chief Complaint  Patient presents with   Diabetes   Hyperlipidemia    Christie Walker is a 53 y.o. year old female who presented for a telephone visit.   They were referred to the pharmacist by their PCP for assistance in managing hyperlipidemia. PMH includes HTN, T2DM, GERD, BMI > 60, bipolar 2 disorder.   Subjective: Patient was last seen by PCP, [name], NP, on 10/05/23. At last visit, patient reported taking her medications as prescribed. Her A1C has increased from 6.5% to 7.0%. Her LDL-C had improved from 139 mg/dL to 80 mg/dL on atorvastatin  40 mg daily. Her UACR was WNL.   Today, patient reports doing well. She is concerned about a possible side effect with her atorvastatin . She reports that she has pain in her upper L leg - which she describes as burning pain and numbness. She states she went through a period of not taking atorvastatin  due to cost, and the pain went away. The pain is unilateral in her L leg and is isolated to the side of her thigh. She works third shift and sleeps most of the day, so she typically only takes her BID medications (gabapentin , metformin ) once daily - since she prefers to take them with food. She reports that in the past she had been on a higher dose of metformin  to help with weight loss.   Care Team: Primary Care Provider: Borer Bascom RAMAN, NP ; Next Scheduled Visit: 12/14/23 Behavioral Health: 12/14/23  Medication Access/Adherence  Current Pharmacy:  [facility] [address] [place] [address] Phone: [phone] Fax: [phone]   Patient reports affordability concerns with their medications: Yes - no prescription insurance, uses DOH supply at [facility].  Patient reports access/transportation concerns to their pharmacy: No  Patient reports adherence concerns with their medications:  Yes  - only taking  metformin  and gabapentin  (BID meds) once daily due to third shift schedule  Diabetes:  Current medications: metformin  IR 500 mg BID with meals  Patient denies hypoglycemic s/sx including dizziness, shakiness, sweating. Patient denies hyperglycemic symptoms including polyuria, polydipsia, polyphagia, nocturia, neuropathy, blurred vision.  Current physical activity: walking at work, always moving up and down  Current medication access support: [facility]   Hyperlipidemia/ASCVD Risk Reduction  Current lipid lowering medications: atorvastatin  40 mg daily (concerned that this is contributing to the nerve pain in her L leg, since the pain went away when she did not take atorvastatin  in the past)  ASCVD History: none  Clinical ASCVD: No  The 10-year ASCVD risk score (Arnett DK, et al., 2019) is: 4.7%   Values used to calculate the score:     Age: 85 years     Clincally relevant sex: Female     Is Non-Hispanic African American: Yes     Diabetic: Yes     Tobacco smoker: No     Systolic Blood Pressure: 108 mmHg     Is BP treated: Yes     HDL Cholesterol: 42 mg/dL     Total Cholesterol: 145 mg/dL    Objective:  BP Readings from Last 3 Encounters:  10/05/23 108/74  05/12/23 (!) 142/93  04/20/23 (!) 149/84    Lab Results  Component Value Date   HGBA1C 7.0 (H) 10/05/2023   HGBA1C 6.5 (A) 05/12/2023   HGBA1C 6.5 (A) 11/11/2022       Latest Ref Rng & Units  10/05/2023    9:17 AM 05/12/2023    9:09 AM 12/09/2022    1:26 PM  BMP  Glucose 70 - 99 mg/dL 874  870  892   BUN 6 - 24 mg/dL 9  12  11    Creatinine 0.57 - 1.00 mg/dL 9.15  9.06  9.12   BUN/Creat Ratio 9 - 23 11  13  13    Sodium 134 - 144 mmol/L 142  142  142   Potassium 3.5 - 5.2 mmol/L 4.6  4.2  4.2   Chloride 96 - 106 mmol/L 103  103  103   CO2 20 - 29 mmol/L 22  23  24    Calcium  8.7 - 10.2 mg/dL 9.8  9.8  89.8     Lab Results  Component Value Date   CHOL 145 10/05/2023   HDL 42 10/05/2023   LDLCALC 80 10/05/2023    TRIG 131 10/05/2023   CHOLHDL 3.5 10/05/2023    Medications Reviewed Today     Reviewed by Brinda Lorain SQUIBB, RPH (Pharmacist) on 11/28/23 at 1435  Med List Status: <None>   Medication Order Taking? Sig Documenting Provider Last Dose Status Informant  acetaminophen  (TYLENOL ) 325 MG tablet [id number]  Take 650 mg by mouth every 6 (six) hours as needed for mild pain or headache. [provider]  Active Self  atorvastatin  (LIPITOR) 40 MG tablet [id number] Yes Take 1 tablet (40 mg total) by mouth daily. Oley Bascom RAMAN, NP  Active   fluticasone  (FLONASE ) 50 MCG/ACT nasal spray [id number]  Place 2 sprays into both nostrils daily.  Patient taking differently: Place 2 sprays into both nostrils daily as needed for allergies.   Sebastian Toribio GAILS, MD  Active Self  furosemide  (LASIX ) 20 MG tablet [id number] Yes Take 1 tablet (20 mg total) by mouth daily. Oley Bascom RAMAN, NP  Active   gabapentin  (NEURONTIN ) 100 MG capsule [id number] Yes Take 1 capsule (100 mg total) by mouth 2 (two) times daily.  Patient taking differently: Take 1 capsule (100 mg total) by mouth 2 (two) times daily.   Harl Zane BRAVO, NP  Active   ibuprofen  (ADVIL ) 800 MG tablet [id number]  Take 1 tablet (800 mg total) by mouth every 8 (eight) hours as needed.  Patient taking differently: Take 800 mg by mouth every 8 (eight) hours as needed for moderate pain (pain score 4-6).   Myrna Camelia HERO, NP  Active Self  IRON PO [id number]  Take 1 tablet by mouth daily. [provider]  Active Self  Lurasidone  HCl (LATUDA ) 60 MG TABS [id number] Yes Take 1 tablet (60 mg total) by mouth daily. Harl Zane E, NP  Active   melatonin 5 MG TABS [id number]  Take 1 tablet (5 mg total) by mouth at bedtime as needed. Harl Zane BRAVO, NP  Active   metFORMIN  (GLUCOPHAGE ) 500 MG tablet [id number] Yes Take 1 tablet (500 mg total) by mouth 2 (two) times daily with a meal. Oley Bascom RAMAN, NP  Active   traMADol  (ULTRAM ) 50 MG tablet  [id number]  Take 1 tablet (50 mg total) by mouth every 12 (twelve) hours as needed. Jule Ronal CROME, PA-C  Active               Assessment/Plan:   Diabetes: - Currently uncontrolled with most recent A1C of 7.0% above goal <7%. Patient is a good candidate for a GLP-1RA given metabolic syndrome, BMI > 60, and worsening glycemic control. She is only taking low-dose metformin . She is agreeable  to stopping metformin  and starting Trulicity .  - Last UACR 10/05/23: 6 mg/g - Patient denies personal or family history of multiple endocrine neoplasia type 2, medullary thyroid  cancer; personal history of pancreatitis or gallbladder disease. - Reviewed long term cardiovascular and renal outcomes of uncontrolled blood sugar - Reviewed goal A1c, goal fasting, and goal 2 hour post prandial glucose - Reviewed dietary modifications including  utilizing the healthy plate method, limiting portion size of carbohydrate foods, increasing intake of protein and non-starchy vegetables. Counseled patient to stay hydrated with water throughout the day. - Reviewed lifestyle modifications including: aiming for 150 minutes of moderate intensity exercise every week.  - Recommend to stop metformin  - Recommend to start Trulicity  0.75 mg weekly.  Patient was educated on storage, administration, and possible GI side effects. - Next A1C due August 2025     Hyperlipidemia/ASCVD Risk Reduction: - Currently uncontrolled with most recent LDL-C of 80 mg/dL above goal < 70 mg/dL given U7IF + co morbidities. High intensity statin indicated. Patient is concerned that her atorvastatin  is contributing to pain in her L leg, though her description of the pain is more consistent with nerve pain than statin-induced myopathy or myalgia. Appropriate to hold medication for 2 weeks, if symptoms are unchanged, resume atorvastatin , if symptoms resolve - start rosuvastatin at 10-20 mg daily.  - Reviewed long term complications of uncontrolled  cholesterol - Reviewed lifestyle recommendations to lower LDL-C including regular physical activity, 5-10% weight loss, eating a diet low in saturated fat, and increasing intake of fiber to at least 25 g per day. - Recommend to hold atorvastatin  for 2 weeks and evaluate s/sx of statin-induced myalgias. Will follow-up via telephone in two weeks to make additional changes as needed.   Written patient instructions provided. Patient verbalized understanding of treatment plan.   Follow Up Plan:  Pharmacist telephone in 2 weeks PCP clinic visit ~01/05/24 (needs to be scheduled)  Lorain Baseman, PharmD [facility] [phone]

## 2023-11-29 ENCOUNTER — Other Ambulatory Visit: Payer: Self-pay

## 2023-12-08 ENCOUNTER — Other Ambulatory Visit: Payer: Self-pay

## 2023-12-08 ENCOUNTER — Telehealth: Payer: Self-pay

## 2023-12-08 DIAGNOSIS — E119 Type 2 diabetes mellitus without complications: Secondary | ICD-10-CM

## 2023-12-08 NOTE — Progress Notes (Signed)
 Attempted to contact patient for scheduled appointment for medication management x2. Left HIPAA compliant message for patient to return my call at their convenience.   Lorain Baseman, PharmD [facility] [phone]

## 2023-12-08 NOTE — Progress Notes (Signed)
 12/08/2023 Name: Christie Walker MRN: [id number] DOB: 1970/11/17  Chief Complaint  Patient presents with   Diabetes   Hyperlipidemia    Christie Walker is a 53 y.o. year old female who presented for a telephone visit.   They were referred to the pharmacist by their PCP for assistance in managing hyperlipidemia. PMH includes HTN, T2DM, GERD, BMI > 60, bipolar 2 disorder.   Subjective: Patient was last seen by PCP, [name], NP, on 10/05/23. At last visit, patient reported taking her medications as prescribed. Her A1C has increased from 6.5% to 7.0%. Her LDL-C had improved from 139 mg/dL to 80 mg/dL on atorvastatin  40 mg daily. Her UACR was WNL. Patient was engaged by pharmacy via telephone on 11/28/23. She reported that she was only taking metformin  once daily. She also described a possible side effect to atorvastatin  (burning/numbness in her upper L leg). We discussed that this is not a typical presentation of statin-induced myalgias, but patient wanted to trial 2 weeks off atorvastatin  and reevaluate symptoms. She also was agreeable to replacing metformin  with Trulicity  for additional glycemic control and possible weight loss.  Today, patient reports doing well. She reports that when she stopped atorvastatin , she did not notice a difference in her pain. She did some of her own research, and decided that the gabapentin  could be making her pain worse, so for the past few days she has not taken it. Previously she reported that she was only taking gabapentin  100 mg once daily to avoid drowsiness during her work schedule. So far she reports that the pain may be slightly better.   Care Team: Primary Care Provider: Borer Bascom RAMAN, NP ; Next Scheduled Visit: 12/14/23 Behavioral Health: 12/14/23  Medication Access/Adherence  Current Pharmacy:  [facility] [address] [place] [address] Phone: [phone] Fax:  [phone]   Patient reports affordability concerns with their medications: Yes - no prescription insurance, uses DO[facility] supply at [facility].  Patient reports access/transportation concerns to their pharmacy: No  Patient reports adherence concerns with their medications:  No    Diabetes:  Current medications: Trulicity  0.75 mg once weekly (Mondays) - has only taken once dose so far  Reports that she is not having GI issues, but she has noticed that she is having fewer bowel movements. She will continue to monitor for constipation.  Patient denies hypoglycemic s/sx including dizziness, shakiness, sweating. Patient denies hyperglycemic symptoms including polyuria, polydipsia, polyphagia, nocturia, neuropathy, blurred vision.  Diet: noticed that at night she is eating small portions, but she has not yet felt the need to reduce the portion size of her first meal of the day (Brunch)  Current physical activity: walking at work, always moving up and down  Current medication access support: [facility]   Hyperlipidemia/ASCVD Risk Reduction  Current lipid lowering medications: atorvastatin  40 mg daily  Reports that she has resumed atorvastatin  after holding for ~1 week to monitor for improvement in leg pain. She resumed when she decided it was not having an impact.   ASCVD History: none  Clinical ASCVD: No  The 10-year ASCVD risk score (Arnett DK, et al., 2019) is: 4.7%   Values used to calculate the score:     Age: 9 years     Clincally relevant sex: Female     Is Non-Hispanic African American: Yes     Diabetic: Yes     Tobacco smoker: No     Systolic  Blood Pressure: 108 mmHg     Is BP treated: Yes     HDL Cholesterol: 42 mg/dL     Total Cholesterol: 145 mg/dL    Objective:  BP Readings from Last 3 Encounters:  10/05/23 108/74  05/12/23 (!) 142/93  04/20/23 (!) 149/84    Lab Results  Component Value Date   HGBA1C 7.0 (H) 10/05/2023   HGBA1C 6.5 (A) 05/12/2023   HGBA1C 6.5 (A)  11/11/2022       Latest Ref Rng & Units 10/05/2023    9:17 AM 05/12/2023    9:09 AM 12/09/2022    1:26 PM  BMP  Glucose 70 - 99 mg/dL 874  870  892   BUN 6 - 24 mg/dL 9  12  11    Creatinine 0.57 - 1.00 mg/dL 9.15  9.06  9.12   BUN/Creat Ratio 9 - 23 11  13  13    Sodium 134 - 144 mmol/L 142  142  142   Potassium 3.5 - 5.2 mmol/L 4.6  4.2  4.2   Chloride 96 - 106 mmol/L 103  103  103   CO2 20 - 29 mmol/L 22  23  24    Calcium  8.7 - 10.2 mg/dL 9.8  9.8  89.8     Lab Results  Component Value Date   CHOL 145 10/05/2023   HDL 42 10/05/2023   LDLCALC 80 10/05/2023   TRIG 131 10/05/2023   CHOLHDL 3.5 10/05/2023    Medications Reviewed Today     Reviewed by Christie Walker, Christie Walker (Pharmacist) on 12/08/23 at 1304  Med List Status: <None>   Medication Order Taking? Sig Documenting Provider Last Dose Status Informant  acetaminophen  (TYLENOL ) 325 MG tablet [id number]  Take 650 mg by mouth every 6 (six) hours as needed for mild pain or headache. [provider]  Active Self  atorvastatin  (LIPITOR) 40 MG tablet [id number] Yes Take 1 tablet (40 mg total) by mouth daily. Oley Bascom RAMAN, NP  Active   Dulaglutide  (TRULICITY ) 0.75 MG/0.5ML EMMANUEL [id number] Yes Inject 0.75 mg into the skin once a week. Nichols, Tonya S, NP  Active   fluticasone  (FLONASE ) 50 MCG/ACT nasal spray [id number] Yes Place 2 sprays into both nostrils daily. Sebastian Toribio GAILS, MD  Active Self  furosemide  (LASIX ) 20 MG tablet [id number] Yes Take 1 tablet (20 mg total) by mouth daily. Oley Bascom RAMAN, NP  Active   gabapentin  (NEURONTIN ) 100 MG capsule [id number]  Take 1 capsule (100 mg total) by mouth 2 (two) times daily.  Patient not taking: Reported on 12/08/2023   Harl Zane BRAVO, NP  Active   ibuprofen  (ADVIL ) 800 MG tablet [id number] Yes Take 1 tablet (800 mg total) by mouth every 8 (eight) hours as needed. Myrna Camelia HERO, NP  Active Self  IRON PO [id number] Yes Take 1 tablet by mouth daily. [provider]   Active Self  Lurasidone  HCl (LATUDA ) 60 MG TABS [id number] Yes Take 1 tablet (60 mg total) by mouth daily. Harl Zane E, NP  Active   melatonin 5 MG TABS [id number] Yes Take 1 tablet (5 mg total) by mouth at bedtime as needed. Harl Zane BRAVO, NP  Active   traMADol  (ULTRAM ) 50 MG tablet [id number] Yes Take 1 tablet (50 mg total) by mouth every 12 (twelve) hours as needed. Jule Ronal CROME, PA-C  Active               Assessment/Plan:   Diabetes: - Currently uncontrolled with most recent  A1C of 7.0% above goal <7%. Patient is a good candidate for a GLP-1RA given metabolic syndrome, BMI > 60, and worsening glycemic control. She has only just started Trulicity , but appears to be tolerating well. Will plan for dose titration after next follow-up appt. - Last UACR 10/05/23: 6 mg/g - Patient denies personal or family history of multiple endocrine neoplasia type 2, medullary thyroid  cancer; personal history of pancreatitis or gallbladder disease. - Reviewed long term cardiovascular and renal outcomes of uncontrolled blood sugar - Reviewed goal A1c, goal fasting, and goal 2 hour post prandial glucose - Reviewed dietary modifications including  utilizing the healthy plate method, limiting portion size of carbohydrate foods, increasing intake of protein and non-starchy vegetables. Counseled patient to stay hydrated with water throughout the day. - Reviewed lifestyle modifications including: aiming for 150 minutes of moderate intensity exercise every week.  - Recommend to continue Trulicity  0.75 mg weekly.  Patient was educated on storage, administration, and possible GI side effects. - Next A1C due August 2025     Hyperlipidemia/ASCVD Risk Reduction: - Currently uncontrolled with most recent LDL-C of 80 mg/dL above goal < 70 mg/dL given U7IF + co morbidities. High intensity statin indicated. Patient appears to be tolerating well, and does not feel it is contributing to her unilateral leg  pain. - Reviewed long term complications of uncontrolled cholesterol - Reviewed lifestyle recommendations to lower LDL-C including regular physical activity, 5-10% weight loss, eating a diet low in saturated fat, and increasing intake of fiber to at least 25 g per day. - Recommend to continue atorvastatin  40 mg daily  Written patient instructions provided. Patient verbalized understanding of treatment plan.   Follow Up Plan:  Pharmacist in person 01/24/24  Lorain Baseman, PharmD [facility] [phone]

## 2023-12-14 ENCOUNTER — Telehealth (HOSPITAL_COMMUNITY): Admitting: Physician Assistant

## 2023-12-14 ENCOUNTER — Encounter (HOSPITAL_COMMUNITY): Payer: Self-pay | Admitting: Physician Assistant

## 2023-12-14 DIAGNOSIS — F3181 Bipolar II disorder: Secondary | ICD-10-CM

## 2023-12-14 MED ORDER — LURASIDONE HCL 60 MG PO TABS
60.0000 mg | ORAL_TABLET | Freq: Every day | ORAL | 3 refills | Status: DC
  Filled 2023-12-14 – 2024-01-01 (×2): qty 30, 30d supply, fill #0
  Filled 2024-01-29: qty 30, 30d supply, fill #1
  Filled 2024-02-26: qty 30, 30d supply, fill #2

## 2023-12-14 MED ORDER — MELATONIN 5 MG PO TABS
5.0000 mg | ORAL_TABLET | Freq: Every evening | ORAL | 3 refills | Status: DC | PRN
  Filled 2023-12-14 – 2023-12-27 (×2): qty 60, 60d supply, fill #0
  Filled 2024-02-19: qty 60, 60d supply, fill #1

## 2023-12-14 NOTE — Progress Notes (Signed)
 BH MD/PA/NP OP Progress Note  Virtual Visit via Video Note  I connected with Christie Walker on 12/14/23 at 11:00 AM EDT by a video enabled telemedicine application and verified that I am speaking with the correct person using two identifiers.  Location: Patient: Home Provider: Clinic   I discussed the limitations of evaluation and management by telemedicine and the availability of in person appointments. The patient expressed understanding and agreed to proceed.  Follow Up Instructions:  I discussed the assessment and treatment plan with the patient. The patient was provided an opportunity to ask questions and all were answered. The patient agreed with the plan and demonstrated an understanding of the instructions.   The patient was advised to call back or seek an in-person evaluation if the symptoms worsen or if the condition fails to improve as anticipated.  I provided 14 minutes of non-face-to-face time during this encounter.  Reginia FORBES Bolster, PA    12/14/2023 9:26 PM Christie Walker  MRN:  [id number]  Chief Complaint:  Chief Complaint  Patient presents with   Follow-up   Medication Refill   HPI:   Christie Walker is a 53 year old female with a past psychiatric history significant for bipolar 2 disorder who presents to [facility] via virtual video visit for follow-up and medication management.  Patient was last seen by Dr. Harl on 09/21/2023.  During her last encounter, patient was being managed on the following psychiatric medications:  Gabapentin  100 mg 2 times daily Latuda  60 mg daily Melatonin 5 mg at bedtime  Since her last encounter with Dr. Harl, patient reports that she has stopped taking her gabapentin .  She reports that she stopped taking her gabapentin  due to experiencing increased nerve pain in her left leg.  Since stopping gabapentin , patient reports that her nerve pain has drastically decreased.   In regards to her mood, patient reports that she is fine.  She reports that she is occasionally provoked to anger by her younger brother.  Patient denies overt depressive symptoms nor does she endorse anxiety.  Patient denies changes in her behavior or paranoia.  Patient further denies auditory or visual hallucinations and does not appear to be responding to internal/external stimuli.  Patient denies any new stressors at this time.  A PHQ-9 screen was performed with the patient scoring a 0.  A GAD-7 screen was also performed with the patient scoring a 1.  Patient is alert and oriented x 4, calm, cooperative, and fully engaged in conversation during the encounter.  Patient endorses fine mood.  Patient exhibits euthymic mood with appropriate affect.  Patient denies suicidal or homicidal ideations.  And further denies auditory or visual hallucinations and does not appear to be responding to internal/external stimuli.  Patient endorses good sleep and receives on average 8 hours of sleep per night.  Patient endorses good appetite and eats on average 2 meals per day.  Patient denies alcohol consumption, tobacco use, or illicit drug use.  Visit Diagnosis:    ICD-10-CM   1. Bipolar 2 disorder (HCC)  F31.81 Lurasidone  HCl (LATUDA ) 60 MG TABS    melatonin 5 MG TABS      Past Psychiatric History:  Bipolar disorder Schizophrenia  Past Medical History:  Past Medical History:  Diagnosis Date   Acid reflux    Bipolar 1 disorder (HCC)    Essential hypertension 06/14/2016   History reviewed. No pertinent surgical history.  Family Psychiatric History:  Paternal aunts bipolar disorder and  father has mental health conditions but unaware which one.  Family History:  Family History  Problem Relation Age of Onset   Mental illness Other     Social History:  Social History   Socioeconomic History   Marital status: Legally Separated    Spouse name: Not on file   Number of children: Not on file   Years of  education: Not on file   Highest education level: Not on file  Occupational History   Not on file  Tobacco Use   Smoking status: Never   Smokeless tobacco: Never  Vaping Use   Vaping status: Never Used  Substance and Sexual Activity   Alcohol use: No   Drug use: No   Sexual activity: Not Currently    Birth control/protection: None  Other Topics Concern   Not on file  Social History Narrative   Not on file   Social Drivers of Health   Financial Resource Strain: Not on file  Food Insecurity: No Food Insecurity (10/05/2023)   Hunger Vital Sign    Worried About Running Out of Food in the Last Year: Never true    Ran Out of Food in the Last Year: Never true  Transportation Needs: No Transportation Needs (10/05/2023)   PRAPARE - Administrator, Civil Service (Medical): No    Lack of Transportation (Non-Medical): No  Physical Activity: Not on file  Stress: Not on file  Social Connections: Not on file    Allergies:  Allergies  Allergen Reactions   Bee Venom Swelling    Swelling at site    Amoxicillin Hives and Other (See Comments)    Patient states that she can take this she just has a sensitivity when its taken along with pain medication   Hydrocodone  Hives and Other (See Comments)    Sensitivity when taken along with antibiotics like amoxicillin per patient   Insect Extract Other (See Comments)    Spiders - make tongue burn   Propoxyphene N-Acetaminophen  Nausea And Vomiting and Rash    ** Darvacet    Metabolic Disorder Labs: Lab Results  Component Value Date   HGBA1C 7.0 (H) 10/05/2023   MPG 139.85 08/06/2019   MPG 128.37 08/01/2019   No results found for: PROLACTIN Lab Results  Component Value Date   CHOL 145 10/05/2023   TRIG 131 10/05/2023   HDL 42 10/05/2023   CHOLHDL 3.5 10/05/2023   VLDL 32 (H) 06/14/2016   LDLCALC 80 10/05/2023   LDLCALC 139 (H) 12/09/2022   Lab Results  Component Value Date   TSH 2.660 12/09/2022   TSH 1.080  10/04/2019    Therapeutic Level Labs: No results found for: LITHIUM No results found for: VALPROATE No results found for: CBMZ  Current Medications: Current Outpatient Medications  Medication Sig Dispense Refill   acetaminophen  (TYLENOL ) 325 MG tablet Take 650 mg by mouth every 6 (six) hours as needed for mild pain or headache.     atorvastatin  (LIPITOR) 40 MG tablet Take 1 tablet (40 mg total) by mouth daily. 90 tablet 3   Dulaglutide  (TRULICITY ) 0.75 MG/0.5ML SOAJ Inject 0.75 mg into the skin once a week. 2 mL 1   fluticasone  (FLONASE ) 50 MCG/ACT nasal spray Place 2 sprays into both nostrils daily. 9.9 g 0   furosemide  (LASIX ) 20 MG tablet Take 1 tablet (20 mg total) by mouth daily. 90 tablet 3   gabapentin  (NEURONTIN ) 100 MG capsule Take 1 capsule (100 mg total) by mouth 2 (two) times  daily. (Patient not taking: Reported on 12/08/2023) 60 capsule 3   ibuprofen  (ADVIL ) 800 MG tablet Take 1 tablet (800 mg total) by mouth every 8 (eight) hours as needed. 30 tablet 2   IRON PO Take 1 tablet by mouth daily.     Lurasidone  HCl (LATUDA ) 60 MG TABS Take 1 tablet (60 mg total) by mouth daily. 30 tablet 3   melatonin 5 MG TABS Take 1 tablet (5 mg total) by mouth at bedtime as needed. 60 tablet 3   traMADol  (ULTRAM ) 50 MG tablet Take 1 tablet (50 mg total) by mouth every 12 (twelve) hours as needed. 30 tablet 1   No current facility-administered medications for this visit.     Musculoskeletal: Strength & Muscle Tone: within normal limits Gait & Station: normal Patient leans: N/A  Psychiatric Specialty Exam: Review of Systems  Psychiatric/Behavioral:  Negative for decreased concentration, dysphoric mood, hallucinations, self-injury, sleep disturbance and suicidal ideas. The patient is not nervous/anxious and is not hyperactive.     There were no vitals taken for this visit.There is no height or weight on file to calculate BMI.  General Appearance: Casual  Eye Contact:  Good  Speech:   Clear and Coherent and Normal Rate  Volume:  Normal  Mood:  Euthymic  Affect:  Appropriate  Thought Process:  Coherent, Goal Directed, and Descriptions of Associations: Intact  Orientation:  Full (Time, Place, and Person)  Thought Content: WDL   Suicidal Thoughts:  No  Homicidal Thoughts:  No  Memory:  Immediate;   Good Recent;   Good Remote;   Good  Judgement:  Good  Insight:  Good  Psychomotor Activity:  Normal  Concentration:  Concentration: Good and Attention Span: Good  Recall:  Good  Fund of Knowledge: Good  Language: Good  Akathisia:  No  Handed:  Right  AIMS (if indicated): not done  Assets:  Communication Skills Desire for Improvement Financial Resources/Insurance Housing Social Support  ADL's:  Intact  Cognition: WNL  Sleep:  Good   Screenings: AIMS    Flowsheet Row Video Visit from 12/14/2023 in [facility]  AIMS Total Score 0   GAD-7    Flowsheet Row Video Visit from 12/14/2023 in [facility] Video Visit from 09/21/2023 in [facility] Video Visit from 06/29/2023 in [facility] Clinical Support from 04/20/2023 in [facility] Clinical Support from 12/22/2022 in [facility]  Total GAD-7 Score 1 1 2 3 2    PHQ2-9    Flowsheet Row Video Visit from 12/14/2023 in [facility] Video Visit from 09/21/2023 in [facility] Video Visit from 06/29/2023 in [facility] Clinical Support from 04/20/2023 in [facility] Clinical Support from 12/22/2022 in [facility]  PHQ-2 Total Score 0 0 0 0 1  PHQ-9 Total Score -- 3 2 1 1    Flowsheet Row Video Visit from 12/14/2023 in [facility] ED from 04/26/2022 in [facility] Emergency Department at [facility] ED from 11/24/2021 in [facility] Emergency Department at [facility]  C-SSRS RISK CATEGORY Moderate Risk No Risk No Risk     Assessment and Plan:   Christie Walker is a 53 year old female with a past psychiatric history significant for bipolar 2 disorder who presents to [facility] via virtual video  visit for follow-up and medication management.  Patient presents to the encounter stating that she has discontinued her use of gabapentin  due to the medication causing her more nerve pain.  Since discontinuing her gabapentin , patient reports that her nerve pain has drastically lessened.  Patient continues to take her other medications as prescribed and denies experiencing any adverse side effects associated with her use of those medications.  Patient states that her mood has been fine but states that she occasionally gets provoked to anger by her younger brother.  Patient denies overt depressive symptoms nor does she endorse anxiety.  A PHQ-9 screen was performed with the patient scoring a 0.  A GAD-7 screen was also performed with the patient scoring a 1.  Patient denies changes in her behavior or paranoia.  Patient does not appear to be responding to internal/external stimuli.  Patient continues to endorse stability on her current medication regimen and would like to continue taking her Latuda  and melatonin as prescribed.  Patient's medications to be e-prescribed to pharmacy of choice.  A [place] Suicide Severity Rating Scale was performed with the patient being considered moderate risk.  Patient denies suicidal ideations and is able to contract for safety at this time.  Safety planning was discussed with the patient prior to the conclusion of the encounter.  - Patient was instructed to contact 911 in the event of a mental health crisis. - Patient was instructed to contact 9[employer] Su[facility] and Crisis [employer]eline in the event of a mental health  crisis. - Patient was instructed to present to [facility] in the event of a mental health crisis.  Collaboration of Care: Collaboration of Care: Medication Management AEB provider managing patient's psychiatric medications, Primary Care Provider AEB patient being seen by internal medicine, and Psychiatrist AEB patient being followed by mental health provider at this facility  Patient/Guardian was advised Release of Information must be obtained prior to any record release in order to collaborate their care with an outside provider. Patient/Guardian was advised if they have not already done so to contact the registration department to sign all necessary forms in order for us  to release information regarding their care.   Consent: Patient/Guardian gives verbal consent for treatment and assignment of benefits for services provided during this visit. Patient/Guardian expressed understanding and agreed to proceed.   1. Bipolar 2 disorder (HCC)  - Lurasidone  HCl (LATUDA ) 60 MG TABS; Take 1 tablet (60 mg total) by mouth daily.  Dispense: 30 tablet; Refill: 3 - melatonin 5 MG TABS; Take 1 tablet (5 mg total) by mouth at bedtime as needed.  Dispense: 60 tablet; Refill: 3  Patient to follow up in 3 months Provider spent a total of 14 minutes with the patient/reviewing patient's chart  Reginia FORBES Bolster, PA 12/14/2023, 9:26 PM

## 2023-12-15 ENCOUNTER — Other Ambulatory Visit: Payer: Self-pay

## 2023-12-22 ENCOUNTER — Other Ambulatory Visit: Payer: Self-pay

## 2023-12-25 ENCOUNTER — Other Ambulatory Visit: Payer: Self-pay

## 2023-12-26 ENCOUNTER — Other Ambulatory Visit: Payer: Self-pay

## 2023-12-27 ENCOUNTER — Other Ambulatory Visit: Payer: Self-pay

## 2023-12-28 ENCOUNTER — Other Ambulatory Visit: Payer: Self-pay

## 2024-01-01 ENCOUNTER — Other Ambulatory Visit: Payer: Self-pay

## 2024-01-03 ENCOUNTER — Other Ambulatory Visit: Payer: Self-pay

## 2024-01-16 ENCOUNTER — Other Ambulatory Visit: Payer: Self-pay

## 2024-01-17 ENCOUNTER — Other Ambulatory Visit: Payer: Self-pay

## 2024-01-22 ENCOUNTER — Other Ambulatory Visit: Payer: Self-pay

## 2024-01-22 ENCOUNTER — Other Ambulatory Visit: Payer: Self-pay | Admitting: Nurse Practitioner

## 2024-01-22 DIAGNOSIS — E119 Type 2 diabetes mellitus without complications: Secondary | ICD-10-CM

## 2024-01-22 MED ORDER — TRULICITY 0.75 MG/0.5ML ~~LOC~~ SOAJ
0.7500 mg | SUBCUTANEOUS | 1 refills | Status: DC
  Filled 2024-01-22: qty 2, 28d supply, fill #0

## 2024-01-24 ENCOUNTER — Ambulatory Visit (INDEPENDENT_AMBULATORY_CARE_PROVIDER_SITE_OTHER): Payer: Self-pay

## 2024-01-24 ENCOUNTER — Other Ambulatory Visit: Payer: Self-pay

## 2024-01-24 VITALS — BP 101/57 | HR 77

## 2024-01-24 DIAGNOSIS — E782 Mixed hyperlipidemia: Secondary | ICD-10-CM | POA: Diagnosis not present

## 2024-01-24 DIAGNOSIS — E119 Type 2 diabetes mellitus without complications: Secondary | ICD-10-CM | POA: Diagnosis not present

## 2024-01-24 LAB — POCT GLYCOSYLATED HEMOGLOBIN (HGB A1C): HbA1c, POC (controlled diabetic range): 6.8 % (ref 0.0–7.0)

## 2024-01-24 MED ORDER — ATORVASTATIN CALCIUM 40 MG PO TABS
40.0000 mg | ORAL_TABLET | Freq: Every day | ORAL | 3 refills | Status: DC
  Filled 2024-01-24 – 2024-03-29 (×2): qty 90, 90d supply, fill #0

## 2024-01-24 MED ORDER — TRULICITY 1.5 MG/0.5ML ~~LOC~~ SOAJ
1.5000 mg | SUBCUTANEOUS | 5 refills | Status: DC
  Filled 2024-01-24: qty 2, 28d supply, fill #0
  Filled 2024-02-19: qty 2, 28d supply, fill #1

## 2024-01-24 NOTE — Progress Notes (Signed)
 01/24/2024 Name: Christie Walker  Chief Complaint  Patient presents with   Diabetes   Hyperlipidemia    Shruthi Northrup Halter is a 53 y.o. year old female who presented for a face-to-face visit today.   They were referred to the pharmacist by their PCP for assistance in managing hyperlipidemia. PMH includes HTN, T2DM, GERD, BMI > 60, bipolar 2 disorder.   Subjective: Patient was last seen by PCP, [name], NP, on 10/05/23. At last visit, patient reported taking her medications as prescribed. Her A1C has increased from 6.5% to 7.0%. Her LDL-C had improved from 139 mg/dL to 80 mg/dL on atorvastatin  40 mg daily. Her UACR was WNL. Patient was engaged by pharmacy via telephone on 11/28/23. She reported that she was only taking metformin  once daily. She also described a possible side effect to atorvastatin  (burning/numbness in her upper L leg). We discussed that this is not a typical presentation of statin-induced myalgias, but patient wanted to trial 2 weeks off atorvastatin  and reevaluate symptoms. She also was agreeable to replacing metformin  with Trulicity  for additional glycemic control and possible weight loss. She was engaged for follow-up on 12/08/23. She reported that her pain did not appear to be due to atorvastatin . She had trialed stopping gabapentin  to try to reduce the burning pain she was experiencing. She reported that she had taken one dose of Trulicity  so far, but was tolerating well.  Today, patient reports doing well. She has been taking Trulicity  0.75 mg weekly for 2 months and report she is tolerating well. She is due to pick up a new box this week. She would like to increase the dose to 1.5 mg to help with weight loss.   Care Team: Primary Care Provider: Borer Bascom RAMAN, NP ; Next Scheduled Visit: needs to be scheduled. Behavioral Health: 12/14/23  Medication Access/Adherence  Current Pharmacy:  [facility] [address] [place] [address] Phone: [phone] Fax: [phone]   Patient reports affordability concerns with their medications: Yes - no prescription insurance, uses [facility] supply at [facility]. Previously denied for Medicaid (unclear whether before or after expansion) Patient reports access/transportation concerns to their pharmacy: No  Patient reports adherence concerns with their medications:  No    Diabetes:  Current medications: Trulicity  0.75 mg once weekly (Mondays)   She reports she is having bowel movements once/day (which is fewer than usually for her). Occasional heartburn that she treats effectively with Pepcid  AC.  Patient denies hypoglycemic s/sx including dizziness, shakiness, sweating. Patient denies hyperglycemic symptoms including polyuria, polydipsia, polyphagia, nocturia, neuropathy, blurred vision.  Diet: noticed that at night she is eating small portions, but she has not yet felt the need to reduce the portion size of her first meal of the day (Brunch). - Drinks water throughout the day. Occasionally has punch or sweet tea. Coffee with added sweetened creamer.   Current physical activity: walking at work, always moving up and down  Current medication access support: [facility] at [facility]   Hyperlipidemia/ASCVD Risk Reduction  Current lipid lowering medications: atorvastatin  40 mg daily  ASCVD History: none  Clinical ASCVD: No  The 10-year ASCVD risk score (Arnett DK, et al., 2019) is: 3.6%   Values used to calculate the score:     Age: 47 years     Clincally relevant sex: Female     Is Non-Hispanic African American: Yes     Diabetic: Yes  Tobacco smoker: No     Systolic Blood Pressure: 101 mmHg     Is BP treated: Yes     HDL Cholesterol: 42 mg/dL     Total Cholesterol: 145 mg/dL    Objective:  BP Readings from Last 3 Encounters:  01/24/24 (!) 101/57  10/05/23 108/74  05/12/23 (!) 142/93    Lab Results   Component Value Date   HGBA1C 6.8 01/24/2024   HGBA1C 7.0 (H) 10/05/2023   HGBA1C 6.5 (A) 05/12/2023       Latest Ref Rng & Units 10/05/2023    9:17 AM 05/12/2023    9:09 AM 12/09/2022    1:26 PM  BMP  Glucose 70 - 99 mg/dL 874  870  892   BUN 6 - 24 mg/dL 9  12  11    Creatinine 0.57 - 1.00 mg/dL 9.15  9.06  9.12   BUN/Creat Ratio 9 - 23 11  13  13    Sodium 134 - 144 mmol/L 142  142  142   Potassium 3.5 - 5.2 mmol/L 4.6  4.2  4.2   Chloride 96 - 106 mmol/L 103  103  103   CO2 20 - 29 mmol/L 22  23  24    Calcium  8.7 - 10.2 mg/dL 9.8  9.8  89.8     Lab Results  Component Value Date   CHOL 145 10/05/2023   HDL 42 10/05/2023   LDLCALC 80 10/05/2023   TRIG 131 10/05/2023   CHOLHDL 3.5 10/05/2023    Medications Reviewed Today     Reviewed by Brinda Lorain SQUIBB, RPH (Pharmacist) on 01/24/24 at 1149  Med List Status: <None>   Medication Order Taking? Sig Documenting Provider Last Dose Status Informant  acetaminophen  (TYLENOL ) 325 MG tablet [id number]  Take 650 mg by mouth every 6 (six) hours as needed for mild pain or headache. [provider]  Active Self  atorvastatin  (LIPITOR) 40 MG tablet [id number] Yes Take 1 tablet (40 mg total) by mouth daily. Oley Bascom RAMAN, NP  Active   Cholecalciferol (VITAMIN D3) 125 MCG (5000 UT) CAPS [id number] Yes Take 1 capsule by mouth daily. [provider]  Active    Discontinued 01/24/24 1031 (Dose change)   Dulaglutide  (TRULICITY ) 1.5 MG/0.5ML SOAJ [id number] Yes Inject 1.5 mg into the skin once a week. Oley Bascom RAMAN, NP  Active   fluticasone  (FLONASE ) 50 MCG/ACT nasal spray [id number]  Place 2 sprays into both nostrils daily. Sebastian Toribio GAILS, MD  Active Self  furosemide  (LASIX ) 20 MG tablet [id number] Yes Take 1 tablet (20 mg total) by mouth daily. Oley Bascom RAMAN, NP  Active    Patient not taking:   Discontinued 01/24/24 1023 (Side effect (s))            Med Note>> Brinda Lorain SQUIBB, [facility]   01/24/2024 10:23 AM Feels it worsens her  nerve pain    ibuprofen  (ADVIL ) 800 MG tablet [id number]  Take 1 tablet (800 mg total) by mouth every 8 (eight) hours as needed. Myrna Camelia HERO, NP  Active Self  IRON PO [id number] Yes Take 1 tablet by mouth daily. [provider]  Active Self  Lurasidone  HCl (LATUDA ) 60 MG TABS [id number] Yes Take 1 tablet (60 mg total) by mouth daily. Nwoko, Uchenna E, PA  Active   melatonin 5 MG TABS [id number]  Take 1 tablet (5 mg total) by mouth at bedtime as needed. Nwoko, Uchenna E, PA  Active   traMADol  (ULTRAM ) 50 MG tablet [id number]  Take 1 tablet (50 mg total) by mouth every 12 (twelve) hours as needed. Jule Ronal CROME, PA-C  Active               Assessment/Plan:   Diabetes: - Currently controlled with most recent A1C of 6.8% today, below goal <7%. Patient is a good candidate for a GLP-1RA given metabolic syndrome, BMI > 60. Though glycemic control has improved, she would like to increase to 1.5 mg weekly for additional benefits (weight loss).  - Last UACR 10/05/23: 6 mg/g - Patient denies personal or family history of multiple endocrine neoplasia type 2, medullary thyroid  cancer; personal history of pancreatitis or gallbladder disease. - Reviewed long term cardiovascular and renal outcomes of uncontrolled blood sugar - Reviewed goal A1c, goal fasting, and goal 2 hour post prandial glucose - Reviewed dietary modifications including  utilizing the healthy plate method, limiting portion size of carbohydrate foods, increasing intake of protein and non-starchy vegetables. Counseled patient to stay hydrated with water throughout the day and advised to reduce intake of juice.  - Reviewed lifestyle modifications including: aiming for 150 minutes of moderate intensity exercise every week.  - Recommend to INCREASE Trulicity  to 1.5 mg weekly.  Patient was educated on storage, administration, and possible GI side effects. - Next A1C due December 2025   Hyperlipidemia/ASCVD Risk Reduction: -  Currently uncontrolled with most recent LDL-C of 80 mg/dL above goal < 70 mg/dL given U7IF + co morbidities. High intensity statin indicated. Should recheck in December and consider addition of ezetimibe if still > 70 mg/dL.  - Reviewed long term complications of uncontrolled cholesterol - Reviewed lifestyle recommendations to lower LDL-C including regular physical activity, 5-10% weight loss, eating a diet low in saturated fat, and increasing intake of fiber to at least 25 g per day. - Recommend to continue atorvastatin  40 mg daily  Written patient instructions provided. Patient verbalized understanding of treatment plan.   Follow Up Plan:  Pharmacist telephone 02/23/24 PCP 04/24/24  Lorain Baseman, PharmD [facility] [phone]

## 2024-01-24 NOTE — Patient Instructions (Addendum)
 It was nice to see you today!  Your goal blood sugar is 80-130 before eating and less than 180 after eating. Your A1C today was 6.8% (below goal less than 7%). Keep up the great work.   Medication Changes:  Increase Trulicity  1.5 mg once weekly. Please reach out if you have trouble tolerating it. See below for tips and tricks when increasing the dose of this medication:   Counseling Points This medication reduces your appetite and may make you feel fuller longer.  Stop eating when your body tells you that you are full. This will likely happen sooner than you are used to. Store your medication in the fridge until you are ready to use it. Inject your medication in the fatty tissue of your lower abdominal area (2 inches away from belly button) or upper outer thigh. Rotate injection sites. Common side effects include: nausea, diarrhea/constipation, and heartburn, and are more likely to occur if you overeat.  Continue all other medication the same.   Monitor blood sugars at home and keep a log (glucometer or piece of paper) to bring with you to your next visit.  Keep up the good work with diet and exercise. Aim for a diet full of vegetables, fruit and lean meats (chicken, [place], fish). Try to limit salt intake by eating fresh or frozen vegetables (instead of canned), rinse canned vegetables prior to cooking and do not add any additional salt to meals.    Lorain Baseman, PharmD [facility] [phone]  Tips for living a healthier life     Building a Healthy and Balanced Diet Make most of your meal vegetables and fruits -  of your plate. Aim for color and variety, and remember that potatoes don't count as vegetables on the Healthy Eating Plate because of their negative impact on blood sugar.  Go for whole grains -  of your plate. Whole and intact grains--whole wheat, barley, wheat berries, quinoa, oats, brown rice, and foods made with them, such as whole wheat pasta--have a  milder effect on blood sugar and insulin  than white bread, white rice, and other refined grains.  Protein power -  of your plate. Fish, poultry, beans, and nuts are all healthy, versatile protein sources--they can be mixed into salads, and pair well with vegetables on a plate. Limit red meat, and avoid processed meats such as bacon and sausage.  Healthy plant oils - in moderation. Choose healthy vegetable oils like olive, canola, soy, corn, sunflower, peanut, and others, and avoid partially hydrogenated oils, which contain unhealthy trans fats. Remember that low-fat does not mean "healthy."  Drink water, coffee, or tea. Skip sugary drinks, limit milk and dairy products to one to two servings per day, and limit juice to a small glass per day.  Stay active. The red figure running across the Healthy Eating Plate's placemat is a reminder that staying active is also important in weight control.  The main message of the Healthy Eating Plate is to focus on diet quality:  The type of carbohydrate in the diet is more important than the amount of carbohydrate in the diet, because some sources of carbohydrate--like vegetables (other than potatoes), fruits, whole grains, and beans--are healthier than others. The Healthy Eating Plate also advises consumers to avoid sugary beverages, a major source of calories--usually with little nutritional value--in the American diet. The Healthy Eating Plate encourages consumers to use healthy oils, and it does not set a maximum on the percentage of calories people should get each  day from healthy sources of fat. In this way, the Healthy Eating Plate recommends the opposite of the low-fat message promoted for decades by the USDA.  [employer]  SUGAR  Sugar is a huge problem in the modern day diet. Sugar is a big contributor to heart disease, diabetes, high triglyceride levels, fatty liver disease and obesity.  Sugar is hidden in almost all packaged foods/beverages. Added sugar is extra sugar that is added beyond what is naturally found and has no nutritional benefit for your body. The American Heart Association recommends limiting added sugars to no more than 25g for women and 36 grams for men per day. There are many names for sugar including maltose, sucrose (names ending in ose), high fructose corn syrup, molasses, cane sugar, corn sweetener, raw sugar, syrup, honey or fruit juice concentrate.   One of the best ways to limit your added sugars is to stop drinking sweetened beverages such as soda, sweet tea, and fruit juice.  There is 65g of added sugars in one 20oz bottle of Coke! That is equal to 7.5 donuts.   Pay attention and read all nutrition facts labels. Below is an examples of a nutrition facts label. The #1 is showing you the total sugars where the # 2 is showing you the added sugars. This one serving has almost the max amount of added sugars per day!     20 oz Soda 65g Sugar = 7.5 Glazed Donuts  16oz Energy  Drink 54g Sugar = 6.5 Glazed Donuts  Large Sweet  Tea 38g Sugar = 4 Glazed Donuts  20oz Sports  Drink 34g Sugar = 3.5 Glazed Donuts  8oz Chocolate Milk 24g Sugar =2.5 Glazed Donuts  8oz Orange  Juice 21g Sugar = 2 Glazed Donuts  1 Juice Box 14g Sugar = 1.5 Glazed Donuts  16oz Water= NO SUGAR!!  EXERCISE  Exercise is good. We've all heard that. In an ideal world, we would all have time and resources to get plenty of it. When you are active, your heart pumps more efficiently and you will feel better.  Multiple studies show that even walking regularly has benefits that include living a longer life. The American Heart Association recommends 150 minutes per week of exercise (30 minutes per day most days of the week). You can do this in any increment you wish. Nine or more 10-minute walks count. So does an hour-long exercise class. Break the time apart into what will work  in your life. Some of the best things you can do include walking briskly, jogging, cycling or swimming laps. Not everyone is ready to "exercise." Sometimes we need to start with just getting active. Here are some easy ways to be more active throughout the day:  Take the stairs instead of the elevator  Go for a 10-15 minute walk during your lunch break (find a friend to make it more enjoyable)  When shopping, park at the back of the parking lot  If you take public transportation, get off one stop early and walk the extra distance  Pace around while making phone calls  Check with your doctor if you aren't sure what your limitations may be. Always remember to drink plenty of water when doing any type of exercise. Don't feel like a failure if you're not getting the 90-150 minutes per week. If you started by being a couch potato, then just a 10-minute walk each day is a huge improvement. Start with little victories and work your way up.  HEALTHY EATING TIPS  When looking to improve your eating habits, whether to lose weight, lower blood pressure or just be healthier, it helps to know what a serving size is.   Grains 1 slice of bread,  bagel,  cup pasta or rice  Vegetables 1 cup fresh or raw vegetables,  cup cooked or canned Fruits 1 piece of medium sized fruit,  cup canned,   Meats/Proteins  cup dried       1 oz meat, 1 egg,  cup cooked beans, nuts or seeds  Dairy        Fats Individual yogurt container, 1 cup (8oz)    1 teaspoon margarine/butter or vegetable  milk or milk alternative, 1 slice of cheese          oil; 1 tablespoon mayonnaise or salad dressing                  Plan ahead: make a menu of the meals for a week then create a grocery list to go with that menu. Consider meals that easily stretch into a night of leftovers, such as stews or casseroles. Or consider making two of your favorite meal and put one in the freezer for another night. Try a night or two each week that is  "meatless" or "no cook" such as salads. When you get home from the grocery store wash and prepare your vegetables and fruits. Then when you need them they are ready to go.   Tips for going to the grocery store:  Buy store or generic brands  Check the weekly ad from your store on-line or in their in-store flyer  Look at the unit price on the shelf tag to compare/contrast the costs of different items  Buy fruits/vegetables in season  Carrots, bananas and apples are low-cost, naturally healthy items  If meats or frozen vegetables are on sale, buy some extras and put in your freezer  Limit buying prepared or "ready to eat" items, even if they are pre-made salads or fruit snacks  Do not shop when you're hungry  Foods at eye level tend to be more expensive. Look on the high and low shelves for deals.  Consider shopping at the farmer's market for fresh foods in season.  Avoid the cookie and chip aisles (these are expensive, high in calories and low in nutritional value). Shop on the outside of the grocery store.  Healthy food preparations:  If you can't get lean hamburger, be sure to drain the fat when cooking  Steam, saut (in olive oil), grill or bake foods  Experiment with different seasonings to avoid adding salt to your foods. Kosher salt, sea salt and Himalayan salt are all still salt and should be avoided. Try seasoning food with onion, garlic, thyme, rosemary, basil ect. Onion powder or garlic powder is ok. Avoid if it says salt (ie garlic salt).

## 2024-01-25 ENCOUNTER — Other Ambulatory Visit: Payer: Self-pay

## 2024-01-29 ENCOUNTER — Other Ambulatory Visit: Payer: Self-pay

## 2024-01-30 ENCOUNTER — Other Ambulatory Visit: Payer: Self-pay

## 2024-01-31 ENCOUNTER — Other Ambulatory Visit: Payer: Self-pay

## 2024-02-01 ENCOUNTER — Encounter: Payer: Self-pay | Admitting: Physician Assistant

## 2024-02-01 ENCOUNTER — Ambulatory Visit (INDEPENDENT_AMBULATORY_CARE_PROVIDER_SITE_OTHER): Payer: Self-pay | Admitting: Physician Assistant

## 2024-02-01 ENCOUNTER — Other Ambulatory Visit: Payer: Self-pay

## 2024-02-01 DIAGNOSIS — M1711 Unilateral primary osteoarthritis, right knee: Secondary | ICD-10-CM

## 2024-02-01 MED ORDER — TRAMADOL HCL 50 MG PO TABS
50.0000 mg | ORAL_TABLET | Freq: Two times a day (BID) | ORAL | 2 refills | Status: AC | PRN
  Filled 2024-02-01: qty 30, 15d supply, fill #0
  Filled 2024-06-11: qty 30, 15d supply, fill #1

## 2024-02-01 MED ORDER — BUPIVACAINE HCL 0.25 % IJ SOLN
2.0000 mL | INTRAMUSCULAR | Status: AC | PRN
  Administered 2024-02-01: 2 mL via INTRA_ARTICULAR

## 2024-02-01 MED ORDER — LIDOCAINE HCL 1 % IJ SOLN
2.0000 mL | INTRAMUSCULAR | Status: AC | PRN
  Administered 2024-02-01: 2 mL

## 2024-02-01 MED ORDER — METHYLPREDNISOLONE ACETATE 40 MG/ML IJ SUSP
40.0000 mg | INTRAMUSCULAR | Status: AC | PRN
  Administered 2024-02-01: 40 mg via INTRA_ARTICULAR

## 2024-02-01 NOTE — Progress Notes (Signed)
 Office Visit Note   Patient: Christie Walker           Date of Birth: [birth year]           MRN: [id number] Visit Date: 02/01/2024              Requested by: Oley Bascom RAMAN, NP [phone] [place],  [place] [address] PCP: Oley Bascom RAMAN, NP   Assessment & Plan: Visit Diagnoses:  1. Unilateral primary osteoarthritis, right knee     Plan: Impression is right knee osteoarthritis.  Today, we discussed various treatment options to include repeat cortisone injection for which she would like to proceed.  We also discussed viscosupplementation injection.  She will follow-up with us  as needed.  Call with concerns or questions.  Follow-Up Instructions: Return if symptoms worsen or fail to improve.   Orders:  Orders Placed This Encounter  Procedures   Large Joint Inj: R knee   Meds ordered this encounter  Medications   traMADol  (ULTRAM ) 50 MG tablet    Sig: Take 1 tablet (50 mg total) by mouth every 12 (twelve) hours as needed.    Dispense:  30 tablet    Refill:  2      Procedures: Large Joint Inj: R knee on 02/01/2024 8:31 AM Indications: pain Details: 22 G needle, anterolateral approach Medications: 2 mL lidocaine  1 %; 2 mL bupivacaine  0.25 %; 40 mg methylPREDNISolone  acetate 40 MG/ML      Clinical Data: No additional findings.   Subjective: Chief Complaint  Patient presents with   Right Knee - Follow-up    HPI patient is a pleasant 53 year old female who comes in today with recurrent right knee pain.  History of osteoarthritis.  We saw her in March 2025 where she underwent cortisone injection to the right knee.  She had good relief for approximately 4 months.  Symptoms have returned.  Pain is constant and is located throughout the entire knee.  Symptoms are aggravated going from a seated to standing position.  She has been taking tramadol  which does seem to help.  She is requesting a repeat cortisone injection today.  Review of Systems as detailed  in HPI.  All others reviewed and are negative.   Objective: Vital Signs: There were no vitals taken for this visit.  Physical Exam well-developed and well-nourished female in no acute distress.  Alert and oriented x 3.  Ortho Exam right knee exam: Range of motion 0 to 95 degrees.  Medial joint line tenderness.  She is neurovascular intact distally.  Specialty Comments:  No specialty comments available.  Imaging: No new imaging   PMFS History: Patient Active Problem List   Diagnosis Date Noted   De Quervain's tenosynovitis, right 10/05/2021   Bipolar 2 disorder (HCC)    Type 2 diabetes mellitus without complication, without long-term current use of insulin  (HCC)    Obesity, Class III, BMI 40-49.9 (morbid obesity)    Pneumonia due to COVID-19 virus 07/31/2019   SIRS (systemic inflammatory response syndrome) (HCC) 07/31/2019   Chest tightness 07/31/2019   Chest pain at rest 08/05/2016   Morbid obesity with BMI of 50.0-59.9, adult (HCC) 08/05/2016   Dyspnea on exertion 08/05/2016   HTN (hypertension) 06/14/2016   Metabolic syndrome 06/14/2016   Chronic knee pain 06/14/2016   OBESITY 03/08/2007   Temporomandibular joint disorder 03/08/2007   GERD 03/08/2007   Past Medical History:  Diagnosis Date   Acid reflux    Bipolar 1 disorder (HCC)  Essential hypertension 06/14/2016    Family History  Problem Relation Age of Onset   Mental illness Other     History reviewed. No pertinent surgical history. Social History   Occupational History   Not on file  Tobacco Use   Smoking status: Never   Smokeless tobacco: Never  Vaping Use   Vaping status: Never Used  Substance and Sexual Activity   Alcohol use: No   Drug use: No   Sexual activity: Not Currently    Birth control/protection: None

## 2024-02-02 ENCOUNTER — Other Ambulatory Visit: Payer: Self-pay

## 2024-02-05 ENCOUNTER — Other Ambulatory Visit: Payer: Self-pay

## 2024-02-08 ENCOUNTER — Other Ambulatory Visit: Payer: Self-pay

## 2024-02-19 ENCOUNTER — Other Ambulatory Visit: Payer: Self-pay

## 2024-02-22 ENCOUNTER — Other Ambulatory Visit: Payer: Self-pay

## 2024-02-23 ENCOUNTER — Other Ambulatory Visit: Payer: Self-pay

## 2024-02-26 ENCOUNTER — Other Ambulatory Visit: Payer: Self-pay

## 2024-02-28 ENCOUNTER — Other Ambulatory Visit: Payer: Self-pay

## 2024-03-01 ENCOUNTER — Other Ambulatory Visit: Payer: Self-pay

## 2024-03-01 ENCOUNTER — Other Ambulatory Visit (HOSPITAL_COMMUNITY): Payer: Self-pay

## 2024-03-01 DIAGNOSIS — E119 Type 2 diabetes mellitus without complications: Secondary | ICD-10-CM

## 2024-03-01 MED ORDER — TRULICITY 3 MG/0.5ML ~~LOC~~ SOAJ
3.0000 mg | SUBCUTANEOUS | 5 refills | Status: DC
  Filled 2024-03-01 – 2024-03-26 (×3): qty 2, 28d supply, fill #0
  Filled 2024-04-22: qty 2, 28d supply, fill #1

## 2024-03-01 NOTE — Progress Notes (Signed)
 03/01/2024 Name: Christie Walker MRN: [id number] DOB: Feb 12, 1971  Chief Complaint  Patient presents with   Diabetes    Christie Walker is a 53 y.o. year old female who presented for a telephone visit today.   They were referred to the pharmacist by their PCP for assistance in managing hyperlipidemia. PMH includes HTN, T2DM, GERD, BMI > 60, bipolar 2 disorder.   Subjective: Patient was last seen by PCP, [name], NP, on 10/05/23. At last visit, patient reported taking her medications as prescribed. Her A1C has increased from 6.5% to 7.0%. Her LDL-C had improved from 139 mg/dL to 80 mg/dL on atorvastatin  40 mg daily. Her UACR was WNL. Patient was engaged by pharmacy via telephone on 11/28/23. She reported that she was only taking metformin  once daily. She also described a possible side effect to atorvastatin  (burning/numbness in her upper L leg). We discussed that this is not a typical presentation of statin-induced myalgias, but patient wanted to trial 2 weeks off atorvastatin  and reevaluate symptoms. She also was agreeable to replacing metformin  with Trulicity  for additional glycemic control and possible weight loss. She was engaged for follow-up on 12/08/23. She reported that her pain did not appear to be due to atorvastatin . She had trialed stopping gabapentin  to try to reduce the burning pain she was experiencing. She reported that she had taken one dose of Trulicity  so far, but was tolerating well. We met in person on 01/24/24. A1C was at goal - 6.8%. She was interested in increasing Trulicity  to 1.5 mg weekly.  Today, patient reports doing well. She has been tolerating Trulicity  1.5 mg weekly well. Has not noticed any change in appetite or side effects  Care Team: Primary Care Provider: Borer Bascom RAMAN, NP ; Next Scheduled Visit: 04/24/24 Behavioral Health: 03/20/24  Medication Access/Adherence  Current Pharmacy:  [facility]  Pharmacy [address], [address] [place] [address] Phone: [phone] Fax: [phone]   Patient reports affordability concerns with their medications: Yes - no prescription insurance, uses [facility] supply at [facility]. Previously denied for Medicaid (unclear whether before or after expansion) Patient reports access/transportation concerns to their pharmacy: No  Patient reports adherence concerns with their medications:  No    Diabetes:  Current medications: Trulicity  1.5 mg once weekly (Mondays)   She reports she is having bowel movements once/day (which is fewer than usually for her). Occasional heartburn that she treats effectively with Pepcid  AC.  Patient denies hypoglycemic s/sx including dizziness, shakiness, sweating. Patient denies hyperglycemic symptoms including polyuria, polydipsia, polyphagia, nocturia, neuropathy, blurred vision.  Diet: noticed that at night she is eating small portions, but she has not yet felt the need to reduce the portion size of her first meal of the day (Brunch). - Drinks water throughout the day. Occasionally has punch or sweet tea. Coffee with added sweetened creamer.   Current physical activity: walking at work, always moving up and down  Current medication access support: [facility] at [facility]   Hyperlipidemia/ASCVD Risk Reduction  Current lipid lowering medications: atorvastatin  40 mg daily  ASCVD History: none  Clinical ASCVD: No  The 10-year ASCVD risk score (Arnett DK, et al., 2019) is: 3.9%   Values used to calculate the score:     Age: 53 years     Clincally relevant sex: Female     Is Non-Hispanic African American: Yes     Diabetic: Yes     Tobacco smoker: No     Systolic  Blood Pressure: 101 mmHg     Is BP treated: Yes     HDL Cholesterol: 42 mg/dL     Total Cholesterol: 145 mg/dL    Objective:  BP Readings from Last 3 Encounters:  01/24/24 (!) 101/57  10/05/23 108/74  05/12/23 (!) 142/93    Lab Results  Component Value  Date   HGBA1C 6.8 01/24/2024   HGBA1C 7.0 (H) 10/05/2023   HGBA1C 6.5 (A) 05/12/2023       Latest Ref Rng & Units 10/05/2023    9:17 AM 05/12/2023    9:09 AM 12/09/2022    1:26 PM  BMP  Glucose 70 - 99 mg/dL 874  870  892   BUN 6 - 24 mg/dL 9  12  11    Creatinine 0.57 - 1.00 mg/dL 9.15  9.06  9.12   BUN/Creat Ratio 9 - 23 11  13  13    Sodium 134 - 144 mmol/L 142  142  142   Potassium 3.5 - 5.2 mmol/L 4.6  4.2  4.2   Chloride 96 - 106 mmol/L 103  103  103   CO2 20 - 29 mmol/L 22  23  24    Calcium  8.7 - 10.2 mg/dL 9.8  9.8  89.8     Lab Results  Component Value Date   CHOL 145 10/05/2023   HDL 42 10/05/2023   LDLCALC 80 10/05/2023   TRIG 131 10/05/2023   CHOLHDL 3.5 10/05/2023    Medications Reviewed Today   Medications were not reviewed in this encounter       Assessment/Plan:   Diabetes: - Currently controlled with most recent A1C of 6.8%, below goal <7%. Patient is a good candidate for a GLP-1RA given metabolic syndrome, BMI > 60. Though glycemic control has improved, she would like to increase to Trulicity  3 mg weekly for additional benefits (weight loss).  - Last UACR 10/05/23: 6 mg/g - Patient denies personal or family history of multiple endocrine neoplasia type 2, medullary thyroid  cancer; personal history of pancreatitis or gallbladder disease. - Reviewed long term cardiovascular and renal outcomes of uncontrolled blood sugar - Reviewed goal A1c, goal fasting, and goal 2 hour post prandial glucose - Reviewed dietary modifications including  utilizing the healthy plate method, limiting portion size of carbohydrate foods, increasing intake of protein and non-starchy vegetables. Counseled patient to stay hydrated with water throughout the day and advised to reduce intake of juice.  - Reviewed lifestyle modifications including: aiming for 150 minutes of moderate intensity exercise every week.  - Recommend to INCREASE Trulicity  to 3 mg weekly - Next A1C due December  2025   Hyperlipidemia/ASCVD Risk Reduction: - Currently uncontrolled with most recent LDL-C of 80 mg/dL above goal < 70 mg/dL given U7IF + co morbidities. High intensity statin indicated. Should recheck in December and consider addition of ezetimibe if still > 70 mg/dL.  - Reviewed long term complications of uncontrolled cholesterol - Reviewed lifestyle recommendations to lower LDL-C including regular physical activity, 5-10% weight loss, eating a diet low in saturated fat, and increasing intake of fiber to at least 25 g per day. - Recommend to continue atorvastatin  40 mg daily  Written patient instructions provided. Patient verbalized understanding of treatment plan.   Follow Up Plan:  Pharmacist telephone 05/07/24 PCP 04/24/24  Lorain Baseman, PharmD [facility] [phone]

## 2024-03-08 ENCOUNTER — Other Ambulatory Visit: Payer: Self-pay

## 2024-03-11 ENCOUNTER — Encounter: Payer: Self-pay | Admitting: Radiology

## 2024-03-12 ENCOUNTER — Other Ambulatory Visit: Payer: Self-pay

## 2024-03-14 ENCOUNTER — Telehealth: Payer: Self-pay | Admitting: Nurse Practitioner

## 2024-03-14 NOTE — Telephone Encounter (Signed)
 Copied from CRM [id number]. Topic: General - Billing Inquiry >> Mar 14, 2024  3:00 PM Mia F wrote: Reason for CRM: Tobias from [facility] billing department calling to verify ins info for [facility] 10/05/2023 [phone]

## 2024-03-20 ENCOUNTER — Telehealth (HOSPITAL_COMMUNITY): Admitting: Psychiatry

## 2024-03-20 ENCOUNTER — Encounter (HOSPITAL_COMMUNITY): Payer: Self-pay

## 2024-03-22 ENCOUNTER — Encounter (HOSPITAL_COMMUNITY): Payer: Self-pay | Admitting: Psychiatry

## 2024-03-22 ENCOUNTER — Telehealth (HOSPITAL_COMMUNITY): Admitting: Psychiatry

## 2024-03-22 ENCOUNTER — Other Ambulatory Visit: Payer: Self-pay

## 2024-03-22 DIAGNOSIS — F3181 Bipolar II disorder: Secondary | ICD-10-CM | POA: Diagnosis not present

## 2024-03-22 MED ORDER — LURASIDONE HCL 60 MG PO TABS
60.0000 mg | ORAL_TABLET | Freq: Every day | ORAL | 3 refills | Status: DC
  Filled 2024-03-22 – 2024-03-29 (×2): qty 30, 30d supply, fill #0

## 2024-03-22 MED ORDER — MELATONIN 5 MG PO TABS
5.0000 mg | ORAL_TABLET | Freq: Every evening | ORAL | 3 refills | Status: DC | PRN
  Filled 2024-03-22 – 2024-03-29 (×2): qty 60, 60d supply, fill #0

## 2024-03-22 NOTE — Progress Notes (Signed)
 BH MD/PA/NP OP Progress Note Virtual Visit via Video Note  I connected with Christie Walker on 03/22/24 at  9:00 AM EST by a video enabled telemedicine application and verified that I am speaking with the correct person using two identifiers.  Location: Patient: Home Provider: Clinic   I discussed the limitations of evaluation and management by telemedicine and the availability of in person appointments. The patient expressed understanding and agreed to proceed.  I provided 30 minutes of non-face-to-face time during this encounter.        03/22/2024 9:12 AM Christie Walker  MRN:  [id number]  Chief Complaint: I am doing okay.   HPI:  53 year old female seen today for follow up psychiatric evaluation. She has a psychiatric history of bipolar disorder and and schizophrenia.  She is currently managed on Melatonin 5 mg nightly, and Latuda  60 mg daily.  She notes her medications are effective in managing her psychiatric conditions.   Today was well-groomed, pleasant, cooperative, and engaged in conversation.  She informed clinical research associate that she has been doing fine.  She notes at times her brother irritates her but reports that she is able to cope with this.  Patient informed writer that overall her mood is stable and notes that she has minimal anxiety and depression.  Today provider conducted a GAD-7 and patient scored a 2.  Provider also conducted PHQ-9 patient scored a 0.  She endorses adequate sleep and appetite.  Today she denies SI/HI/AVH, mania, paranoia.      Since last visit she reports that she occasionally has neuropathy in her legs.  She describes it as a burning shooting pain.  Patient notes that gabapentin  made it worse.  She reports that she addressed this with her PCP but at this time it is not being rectified.  Overall patient notes that she is doing well.  No medication changes made today.  Patient agreeable to continue medications as prescribed.  No other concerns  noted at this time.     Visit Diagnosis:    ICD-10-CM   1. Bipolar 2 disorder (HCC)  F31.81 Lurasidone  HCl (LATUDA ) 60 MG TABS    melatonin 5 MG TABS           Past Psychiatric History:  bipolar disorder and and schizophrenia  Past Medical History:  Past Medical History:  Diagnosis Date   Acid reflux    Bipolar 1 disorder (HCC)    Essential hypertension 06/14/2016   History reviewed. No pertinent surgical history.  Family Psychiatric History: Paternal aunts bipolar disorder and father has mental health conditions but unaware which one.   Family History:  Family History  Problem Relation Age of Onset   Mental illness Other     Social History:  Social History   Socioeconomic History   Marital status: Legally Separated    Spouse name: Not on file   Number of children: Not on file   Years of education: Not on file   Highest education level: Not on file  Occupational History   Not on file  Tobacco Use   Smoking status: Never   Smokeless tobacco: Never  Vaping Use   Vaping status: Never Used  Substance and Sexual Activity   Alcohol use: No   Drug use: No   Sexual activity: Not Currently    Birth control/protection: None  Other Topics Concern   Not on file  Social History Narrative   Not on file   Social Drivers of Corporate Investment Banker  Strain: Not on file  Food Insecurity: No Food Insecurity (10/05/2023)   Hunger Vital Sign    Worried About Running Out of Food in the Last Year: Never true    Ran Out of Food in the Last Year: Never true  Transportation Needs: No Transportation Needs (10/05/2023)   PRAPARE - Administrator, Civil Service (Medical): No    Lack of Transportation (Non-Medical): No  Physical Activity: Not on file  Stress: Not on file  Social Connections: Not on file    Allergies:  Allergies  Allergen Reactions   Bee Venom Swelling    Swelling at site    Amoxicillin Hives and Other (See Comments)    Patient states that  she can take this she just has a sensitivity when its taken along with pain medication   Hydrocodone  Hives and Other (See Comments)    Sensitivity when taken along with antibiotics like amoxicillin per patient   Insect Extract Other (See Comments)    Spiders - make tongue burn   Propoxyphene N-Acetaminophen  Nausea And Vomiting and Rash    ** Darvacet    Metabolic Disorder Labs: Lab Results  Component Value Date   HGBA1C 6.8 01/24/2024   MPG 139.85 08/06/2019   MPG 128.37 08/01/2019   No results found for: PROLACTIN Lab Results  Component Value Date   CHOL 145 10/05/2023   TRIG 131 10/05/2023   HDL 42 10/05/2023   CHOLHDL 3.5 10/05/2023   VLDL 32 (H) 06/14/2016   LDLCALC 80 10/05/2023   LDLCALC 139 (H) 12/09/2022   Lab Results  Component Value Date   TSH 2.660 12/09/2022   TSH 1.080 10/04/2019    Therapeutic Level Labs: No results found for: LITHIUM No results found for: VALPROATE No results found for: CBMZ  Current Medications: Current Outpatient Medications  Medication Sig Dispense Refill   acetaminophen  (TYLENOL ) 325 MG tablet Take 650 mg by mouth every 6 (six) hours as needed for mild pain or headache.     atorvastatin  (LIPITOR) 40 MG tablet Take 1 tablet (40 mg total) by mouth daily. 90 tablet 3   Cholecalciferol (VITAMIN D3) 125 MCG (5000 UT) CAPS Take 1 capsule by mouth daily.     Dulaglutide  (TRULICITY ) 3 MG/0.5ML SOAJ Inject 3 mg into the skin once a week. 2 mL 5   fluticasone  (FLONASE ) 50 MCG/ACT nasal spray Place 2 sprays into both nostrils daily. 9.9 g 0   furosemide  (LASIX ) 20 MG tablet Take 1 tablet (20 mg total) by mouth daily. 90 tablet 3   ibuprofen  (ADVIL ) 800 MG tablet Take 1 tablet (800 mg total) by mouth every 8 (eight) hours as needed. 30 tablet 2   IRON PO Take 1 tablet by mouth daily.     Lurasidone  HCl (LATUDA ) 60 MG TABS Take 1 tablet (60 mg total) by mouth daily. 30 tablet 3   melatonin 5 MG TABS Take 1 tablet (5 mg total) by mouth  at bedtime as needed. 60 tablet 3   traMADol  (ULTRAM ) 50 MG tablet Take 1 tablet (50 mg total) by mouth every 12 (twelve) hours as needed. 30 tablet 2   No current facility-administered medications for this visit.     Musculoskeletal: Strength & Muscle Tone: within normal limits and telehealth visit Gait & Station: normal, Telehealth visit Patient leans: N/A  Psychiatric Specialty Exam: Review of Systems  There were no vitals taken for this visit.There is no height or weight on file to calculate BMI.  General Appearance:  Well Groomed  Eye Contact:  Good  Speech:  Clear and Coherent and Normal Rate  Volume:  Normal  Mood:  Euthymic  Affect:  Appropriate and Congruent  Thought Process:  Coherent, Goal Directed and Linear  Orientation:  Full (Time, Place, and Person)  Thought Content: WDL and Logical   Suicidal Thoughts:  No  Homicidal Thoughts:  No  Memory:  Immediate;   Good Recent;   Good Remote;   Good  Judgement:  Good  Insight:  Good  Psychomotor Activity:  Normal  Concentration:  Concentration: Good and Attention Span: Good  Recall:  Good  Fund of Knowledge: Good  Language: Good  Akathisia:  No  Handed:  Right  AIMS (if indicated): not done  Assets:  Communication Skills Desire for Improvement Financial Resources/Insurance Housing Social Support  ADL's:  Intact  Cognition: WNL  Sleep:  Good   Screenings: AIMS    Flowsheet Row Video Visit from 12/14/2023 [facility]  AIMS Total Score 0   GAD-7    Flowsheet Row Video Visit from 03/22/2024 in [facility] Video Visit from 12/14/2023 in [facility] Video Visit from 09/21/2023 in [facility] Video Visit from 06/29/2023 in [facility] Clinical Support from 04/20/2023 in [facility]  Total GAD-7 Score 2 1 1 2 3    PHQ2-9    Flowsheet Row Video  Visit from 03/22/2024 in [facility] Video Visit from 12/14/2023 in [facility] Video Visit from 09/21/2023 in [facility] Video Visit from 06/29/2023 in [facility] Clinical Support from 04/20/2023 in [facility]  PHQ-2 Total Score 0 0 0 0 0  PHQ-9 Total Score 0 -- 3 2 1    Flowsheet Row Video Visit from 12/14/2023 in [facility] ED from 04/26/2022 in [facility] Emergency Department at [facility] ED from 11/24/2021 in [facility] Emergency Department at [facility]  C-SSRS RISK CATEGORY Moderate Risk No Risk No Risk     Assessment and Plan: Patient informed writer that she is doing well on her current medication regimen.  No medication changes made today.  Patient agreeable to continue medications as prescribed.     1. Bipolar 2 disorder (HCC)  Continue- Lurasidone  HCl (LATUDA ) 60 MG TABS; Take 1 tablet (60 mg total) by mouth daily.  Dispense: 30 tablet; Refill: 3 Continue- melatonin 5 MG TABS; Take 1 tablet (5 mg total) by mouth at bedtime as needed.  Dispense: 60 tablet; Refill: 3   Follow-up in 3 months  Zane FORBES Bach, NP  03/22/2024, 9:12 AM

## 2024-03-26 ENCOUNTER — Other Ambulatory Visit: Payer: Self-pay

## 2024-03-28 ENCOUNTER — Other Ambulatory Visit: Payer: Self-pay

## 2024-03-29 ENCOUNTER — Other Ambulatory Visit: Payer: Self-pay

## 2024-04-03 ENCOUNTER — Other Ambulatory Visit: Payer: Self-pay

## 2024-04-22 ENCOUNTER — Other Ambulatory Visit: Payer: Self-pay

## 2024-04-24 ENCOUNTER — Encounter: Payer: Self-pay | Admitting: Nurse Practitioner

## 2024-04-24 ENCOUNTER — Ambulatory Visit: Payer: Self-pay | Admitting: Nurse Practitioner

## 2024-04-24 ENCOUNTER — Other Ambulatory Visit: Payer: Self-pay

## 2024-04-24 VITALS — BP 111/62 | HR 80 | Wt 343.0 lb

## 2024-04-24 DIAGNOSIS — F3181 Bipolar II disorder: Secondary | ICD-10-CM

## 2024-04-24 DIAGNOSIS — E119 Type 2 diabetes mellitus without complications: Secondary | ICD-10-CM

## 2024-04-24 DIAGNOSIS — R600 Localized edema: Secondary | ICD-10-CM

## 2024-04-24 DIAGNOSIS — I10 Essential (primary) hypertension: Secondary | ICD-10-CM

## 2024-04-24 DIAGNOSIS — E782 Mixed hyperlipidemia: Secondary | ICD-10-CM

## 2024-04-24 LAB — POCT GLYCOSYLATED HEMOGLOBIN (HGB A1C): Hemoglobin A1C: 6 % — AB (ref 4.0–5.6)

## 2024-04-24 MED ORDER — FUROSEMIDE 20 MG PO TABS
20.0000 mg | ORAL_TABLET | Freq: Every day | ORAL | 3 refills | Status: AC
  Filled 2024-04-24 – 2024-05-06 (×2): qty 90, 90d supply, fill #0

## 2024-04-24 MED ORDER — ATORVASTATIN CALCIUM 40 MG PO TABS
40.0000 mg | ORAL_TABLET | Freq: Every day | ORAL | 3 refills | Status: AC
  Filled 2024-04-24: qty 90, 90d supply, fill #0

## 2024-04-24 MED ORDER — LURASIDONE HCL 60 MG PO TABS
60.0000 mg | ORAL_TABLET | Freq: Every day | ORAL | 3 refills | Status: DC
  Filled 2024-04-24 – 2024-04-29 (×2): qty 30, 30d supply, fill #0
  Filled 2024-05-27: qty 30, 30d supply, fill #1

## 2024-04-24 MED ORDER — FLUTICASONE PROPIONATE 50 MCG/ACT NA SUSP
2.0000 | Freq: Every day | NASAL | 0 refills | Status: DC
  Filled 2024-04-24: qty 16, 30d supply, fill #0

## 2024-04-24 MED ORDER — TRULICITY 3 MG/0.5ML ~~LOC~~ SOAJ: 3.0000 mg | SUBCUTANEOUS | 5 refills | Status: DC

## 2024-04-24 NOTE — Progress Notes (Signed)
 Subjective   Patient ID: Christie Walker, female    DOB: [birth year], 53 y.o.   MRN: [id number]  Chief Complaint  Patient presents with   Diabetes   Labs Only    lipid    Referring provider: Oley Bascom RAMAN, NP  Christie Walker is a 53 y.o. female with Past Medical History: No date: Acid reflux No date: Bipolar 1 disorder (HCC) 06/14/2016: Essential hypertension   HPI  Patient presents today for a follow-up visit.  She is compliant with medications.  Her A1c at last visit was 6.0. Stable from last visit.  Does need labs today. Denies f/c/s, n/v/d, hemoptysis, PND, leg swelling. Denies chest pain or edema.      Allergies[1]  Immunization History  Administered Date(s) Administered   Moderna Covid-19 Vaccine Bivalent Booster 77yrs & up 02/23/2021   Tdap 08/04/2016    Tobacco History: Tobacco Use History[2] Counseling given: Not Answered   Outpatient Encounter Medications as of 04/24/2024  Medication Sig   acetaminophen  (TYLENOL ) 325 MG tablet Take 650 mg by mouth every 6 (six) hours as needed for mild pain or headache.   Cholecalciferol (VITAMIN D3) 125 MCG (5000 UT) CAPS Take 1 capsule by mouth daily.   ibuprofen  (ADVIL ) 800 MG tablet Take 1 tablet (800 mg total) by mouth every 8 (eight) hours as needed.   IRON PO Take 1 tablet by mouth daily.   melatonin 5 MG TABS Take 1 tablet (5 mg total) by mouth at bedtime as needed.   traMADol  (ULTRAM ) 50 MG tablet Take 1 tablet (50 mg total) by mouth every 12 (twelve) hours as needed.   [DISCONTINUED] atorvastatin  (LIPITOR) 40 MG tablet Take 1 tablet (40 mg total) by mouth daily.   [DISCONTINUED] Dulaglutide  (TRULICITY ) 3 MG/0.5ML SOAJ Inject 3 mg into the skin once a week.   [DISCONTINUED] fluticasone  (FLONASE ) 50 MCG/ACT nasal spray Place 2 sprays into both nostrils daily.   [DISCONTINUED] furosemide  (LASIX ) 20 MG tablet Take 1 tablet (20 mg total) by mouth daily.   [DISCONTINUED] Lurasidone  HCl (LATUDA ) 60 MG  TABS Take 1 tablet (60 mg total) by mouth daily.   atorvastatin  (LIPITOR) 40 MG tablet Take 1 tablet (40 mg total) by mouth daily.   Dulaglutide  (TRULICITY ) 3 MG/0.5ML SOAJ Inject 3 mg into the skin once a week.   fluticasone  (FLONASE ) 50 MCG/ACT nasal spray Place 2 sprays into both nostrils daily.   furosemide  (LASIX ) 20 MG tablet Take 1 tablet (20 mg total) by mouth daily.   Lurasidone  HCl (LATUDA ) 60 MG TABS Take 1 tablet (60 mg total) by mouth daily.   No facility-administered encounter medications on file as of 04/24/2024.    Review of Systems  Review of Systems  Constitutional: Negative.   HENT: Negative.    Cardiovascular: Negative.   Gastrointestinal: Negative.   Allergic/Immunologic: Negative.   Neurological: Negative.   Psychiatric/Behavioral: Negative.       Objective:   BP 111/62 (BP Location: Left Arm, Patient Position: Sitting, Cuff Size: Large)   Pulse 80   Wt (!) 343 lb (155.6 kg)   SpO2 98%   BMI 58.88 kg/m   Wt Readings from Last 5 Encounters:  04/24/24 (!) 343 lb (155.6 kg)  10/05/23 (!) 354 lb (160.6 kg)  07/25/23 (!) 361 lb (163.7 kg)  05/12/23 (!) 356 lb 3.2 oz (161.6 kg)  03/21/23 (!) 360 lb (163.3 kg)     Physical Exam Vitals and nursing note reviewed.  Constitutional:  General: She is not in acute distress.    Appearance: She is well-developed.  Cardiovascular:     Rate and Rhythm: Normal rate and regular rhythm.  Pulmonary:     Effort: Pulmonary effort is normal.     Breath sounds: Normal breath sounds.  Neurological:     Mental Status: She is alert and oriented to person, place, and time.       Assessment & Plan:   Type 2 diabetes mellitus without complication, without long-term current use of insulin  (HCC) -     POCT glycosylated hemoglobin (Hb A1C) -     Trulicity ; Inject 3 mg into the skin once a week.  Dispense: 2 mL; Refill: 5 -     CBC -     Comprehensive metabolic panel with GFR  Mixed hyperlipidemia -      Atorvastatin  Calcium ; Take 1 tablet (40 mg total) by mouth daily.  Dispense: 90 tablet; Refill: 3  Essential hypertension -     Furosemide ; Take 1 tablet (20 mg total) by mouth daily.  Dispense: 90 tablet; Refill: 3  Bilateral lower extremity edema -     Furosemide ; Take 1 tablet (20 mg total) by mouth daily.  Dispense: 90 tablet; Refill: 3  Bipolar 2 disorder (HCC) -     Lurasidone  HCl; Take 1 tablet (60 mg total) by mouth daily.  Dispense: 30 tablet; Refill: 3  Other orders -     Fluticasone  Propionate; Place 2 sprays into both nostrils daily.  Dispense: 16 g; Refill: 0     Return in about 3 months (around 07/23/2024).     Bascom GORMAN Borer, NP 04/24/2024     [1]  Allergies Allergen Reactions   Bee Venom Swelling    Swelling at site    Amoxicillin Hives and Other (See Comments)    Patient states that she can take this she just has a sensitivity when its taken along with pain medication   Hydrocodone  Hives and Other (See Comments)    Sensitivity when taken along with antibiotics like amoxicillin per patient   Insect Extract Other (See Comments)    Spiders - make tongue burn   Propoxyphene N-Acetaminophen  Nausea And Vomiting and Rash    ** Darvacet  [2]  Social History Tobacco Use  Smoking Status Never  Smokeless Tobacco Never

## 2024-04-25 ENCOUNTER — Other Ambulatory Visit: Payer: Self-pay

## 2024-04-25 ENCOUNTER — Ambulatory Visit: Payer: Self-pay | Admitting: Nurse Practitioner

## 2024-04-25 LAB — CBC
Hematocrit: 42.1 % (ref 34.0–46.6)
Hemoglobin: 13.3 g/dL (ref 11.1–15.9)
MCH: 29.8 pg (ref 26.6–33.0)
MCHC: 31.6 g/dL (ref 31.5–35.7)
MCV: 94 fL (ref 79–97)
Platelets: 303 x10E3/uL (ref 150–450)
RBC: 4.47 x10E6/uL (ref 3.77–5.28)
RDW: 15.2 % (ref 11.7–15.4)
WBC: 7.2 x10E3/uL (ref 3.4–10.8)

## 2024-04-25 LAB — COMPREHENSIVE METABOLIC PANEL WITH GFR
ALT: 25 IU/L (ref 0–32)
AST: 20 IU/L (ref 0–40)
Albumin: 4 g/dL (ref 3.8–4.9)
Alkaline Phosphatase: 162 IU/L — ABNORMAL HIGH (ref 49–135)
BUN/Creatinine Ratio: 12 (ref 9–23)
BUN: 10 mg/dL (ref 6–24)
Bilirubin Total: 0.2 mg/dL (ref 0.0–1.2)
CO2: 25 mmol/L (ref 20–29)
Calcium: 9.8 mg/dL (ref 8.7–10.2)
Chloride: 101 mmol/L (ref 96–106)
Creatinine, Ser: 0.85 mg/dL (ref 0.57–1.00)
Globulin, Total: 2.7 g/dL (ref 1.5–4.5)
Glucose: 90 mg/dL (ref 70–99)
Potassium: 4 mmol/L (ref 3.5–5.2)
Sodium: 141 mmol/L (ref 134–144)
Total Protein: 6.7 g/dL (ref 6.0–8.5)
eGFR: 82 mL/min/1.73 (ref >59)

## 2024-04-29 ENCOUNTER — Other Ambulatory Visit: Payer: Self-pay

## 2024-05-01 ENCOUNTER — Other Ambulatory Visit: Payer: Self-pay

## 2024-05-06 ENCOUNTER — Other Ambulatory Visit: Payer: Self-pay

## 2024-05-07 ENCOUNTER — Telehealth: Payer: Self-pay

## 2024-05-07 ENCOUNTER — Other Ambulatory Visit: Payer: Self-pay

## 2024-05-07 DIAGNOSIS — E119 Type 2 diabetes mellitus without complications: Secondary | ICD-10-CM

## 2024-05-07 MED ORDER — TRULICITY 4.5 MG/0.5ML ~~LOC~~ SOAJ
4.5000 mg | SUBCUTANEOUS | 5 refills | Status: DC
  Filled 2024-05-07 – 2024-05-22 (×3): qty 2, 28d supply, fill #0

## 2024-05-07 NOTE — Progress Notes (Signed)
 Attempted to contact patient for scheduled appointment for medication management. Left HIPAA compliant message for patient to return my call at their convenience.   Lorain Baseman, PharmD [facility] [phone]

## 2024-05-07 NOTE — Progress Notes (Signed)
 "  05/07/2024 Name: Christie Walker MRN: [id number] DOB: 1971/04/20  Chief Complaint  Patient presents with   Diabetes   Hyperlipidemia    Christie Walker is a 53 y.o. year old female who presented for a telephone visit today.   They were referred to the pharmacist by their PCP for assistance in managing hyperlipidemia. PMH includes HTN, T2DM, GERD, BMI > 60, bipolar 2 disorder.   Subjective: Patient was last seen by PCP, Christie Borer, NP, on 04/24/24. A1C was 6.0%. She was tolerating Trulicity  3 mg weekly well. Lost 10 lbs compared to May 2025 appt.   Today, patient reports doing well. Did not notice any appetite changes with higher dose of Trulicity . Would like to continue application and pursue application for Ozempic patient assistance.   Care Team: Primary Care Provider: Borer Christie RAMAN, NP ; Next Scheduled Visit: 07/24/24 Behavioral Health: 06/14/24  Medication Access/Adherence  Current Pharmacy:  [facility] [address], [address] [place] [address] Phone: [phone] Fax: [phone]   Patient reports affordability concerns with their medications: Yes - no prescription insurance, uses DOH supply at [facility]. Previously denied for Medicaid (unclear whether before or after expansion) Patient reports access/transportation concerns to their pharmacy: No  Patient reports adherence concerns with their medications:  No    Diabetes:  Current medications: Trulicity  3 mg once weekly (Mondays)   She reports she is having bowel movements once/day (which is fewer than usually for her). Occasional heartburn that she treats effectively with Pepcid  AC.  Patient denies hypoglycemic s/sx including dizziness, shakiness, sweating. Patient denies hyperglycemic symptoms including polyuria, polydipsia, polyphagia, nocturia, neuropathy, blurred vision.  Diet: noticed that at night she is eating small portions, but she has  not yet felt the need to reduce the portion size of her first meal of the day (Brunch). - Drinks water throughout the day. Occasionally has punch or sweet tea. Coffee with added sweetened creamer.   Current physical activity: walking at work, always moving up and down  Current medication access support: DOH at [facility]   Hyperlipidemia/ASCVD Risk Reduction  Current lipid lowering medications: atorvastatin  40 mg daily  ASCVD History: none  Clinical ASCVD: No  The 10-year ASCVD risk score (Arnett DK, et al., 2019) is: 5.6%   Values used to calculate the score:     Age: 76 years     Clinically relevant sex: Female     Is Non-Hispanic African American: Yes     Diabetic: Yes     Tobacco smoker: No     Systolic Blood Pressure: 111 mmHg     Is BP treated: Yes     HDL Cholesterol: 42 mg/dL     Total Cholesterol: 145 mg/dL    Objective:  BP Readings from Last 3 Encounters:  04/24/24 111/62  01/24/24 (!) 101/57  10/05/23 108/74    Lab Results  Component Value Date   HGBA1C 6.0 (A) 04/24/2024   HGBA1C 6.8 01/24/2024   HGBA1C 7.0 (H) 10/05/2023       Latest Ref Rng & Units 04/24/2024   10:17 AM 10/05/2023    9:17 AM 05/12/2023    9:09 AM  BMP  Glucose 70 - 99 mg/dL 90  874  870   BUN 6 - 24 mg/dL 10  9  12    Creatinine 0.57 - 1.00 mg/dL 9.14  9.15  9.06   BUN/Creat Ratio 9 - 23 12  11  13    Sodium 134 -  144 mmol/L 141  142  142   Potassium 3.5 - 5.2 mmol/L 4.0  4.6  4.2   Chloride 96 - 106 mmol/L 101  103  103   CO2 20 - 29 mmol/L 25  22  23    Calcium  8.7 - 10.2 mg/dL 9.8  9.8  9.8     Lab Results  Component Value Date   CHOL 145 10/05/2023   HDL 42 10/05/2023   LDLCALC 80 10/05/2023   TRIG 131 10/05/2023   CHOLHDL 3.5 10/05/2023    Medications Reviewed Today     Reviewed by Brinda Lorain SQUIBB, RPH-CPP (Pharmacist) on 05/07/24 at 1114  Med List Status: <None>   Medication Order Taking? Sig Documenting Provider Last Dose Status Informant  acetaminophen  (TYLENOL ) 325 MG  tablet [id number]  Take 650 mg by mouth every 6 (six) hours as needed for mild pain or headache. [provider]  Active Self  atorvastatin  (LIPITOR) 40 MG tablet [id number] Yes Take 1 tablet (40 mg total) by mouth daily. Oley Christie RAMAN, NP  Active   Cholecalciferol (VITAMIN D3) 125 MCG (5000 UT) CAPS [id number]  Take 1 capsule by mouth daily. [provider]  Active    Discontinued 05/07/24 1110 (Dose change)   Dulaglutide  (TRULICITY ) 4.5 MG/0.5ML SOAJ [id number] Yes Inject 4.5 mg into the skin once a week. Oley Christie RAMAN, NP  Active   fluticasone  (FLONASE ) 50 MCG/ACT nasal spray [id number]  Place 2 sprays into both nostrils daily. Oley Christie RAMAN, NP  Active   furosemide  (LASIX ) 20 MG tablet [id number] Yes Take 1 tablet (20 mg total) by mouth daily. Oley Christie RAMAN, NP  Active   ibuprofen  (ADVIL ) 800 MG tablet [id number]  Take 1 tablet (800 mg total) by mouth every 8 (eight) hours as needed. Myrna Camelia HERO, NP  Active Self  IRON PO [id number]  Take 1 tablet by mouth daily. [provider]  Active Self  Lurasidone  HCl (LATUDA ) 60 MG TABS [id number] Yes Take 1 tablet (60 mg total) by mouth daily. Oley Christie RAMAN, NP  Active   melatonin 5 MG TABS [id number]  Take 1 tablet (5 mg total) by mouth at bedtime as needed. Harl Zane BRAVO, NP  Active   traMADol  (ULTRAM ) 50 MG tablet [id number]  Take 1 tablet (50 mg total) by mouth every 12 (twelve) hours as needed. Jule Ronal CROME, PA-C  Active               Assessment/Plan:   Diabetes: - Currently controlled with most recent A1C of 6.0%, below goal <7%. Patient is a good candidate for a GLP-1RA given metabolic syndrome, BMI > 60. Will increase Trulicity  to 4.5 mg weekly for increased weight loss effect, and initiate application for more potent GLP-1RA, Ozempic, since she is uninsured. - Last UACR 10/05/23: 6 mg/g - Patient denies personal or family history of multiple endocrine neoplasia type 2, medullary thyroid  cancer;  personal history of pancreatitis or gallbladder disease. - Reviewed long term cardiovascular and renal outcomes of uncontrolled blood sugar - Reviewed goal A1c, goal fasting, and goal 2 hour post prandial glucose - Reviewed dietary modifications including  utilizing the healthy plate method, limiting portion size of carbohydrate foods, increasing intake of protein and non-starchy vegetables. Counseled patient to stay hydrated with water throughout the day and advised to reduce intake of juice.  - Reviewed lifestyle modifications including: aiming for 150 minutes of moderate intensity exercise every week.  - Recommend to INCREASE Trulicity  to 4.5 mg weekly -  Will collaborate with patient advocate team to initiate application for Ozempic 0.5 mg weekly. Confirmed mailing address. - Next A1C due December 2025   Hyperlipidemia/ASCVD Risk Reduction: - Currently uncontrolled with most recent LDL-C of 80 mg/dL above goal < 70 mg/dL given U7IF + co morbidities. High intensity statin indicated. Will recheck at next PCP appt. - Reviewed long term complications of uncontrolled cholesterol - Reviewed lifestyle recommendations to lower LDL-C including regular physical activity, 5-10% weight loss, eating a diet low in saturated fat, and increasing intake of fiber to at least 25 g per day. - Recommend to continue atorvastatin  40 mg daily - Repeat lipid panel at PCP appt in March 2026  Written patient instructions provided. Patient verbalized understanding of treatment plan.   Follow Up Plan:  Pharmacist telephone 06/18/24 PCP 07/24/24  Lorain Baseman, PharmD [facility] [phone]   "

## 2024-05-08 ENCOUNTER — Other Ambulatory Visit: Payer: Self-pay

## 2024-05-13 ENCOUNTER — Other Ambulatory Visit: Payer: Self-pay

## 2024-05-17 ENCOUNTER — Other Ambulatory Visit: Payer: Self-pay

## 2024-05-20 ENCOUNTER — Other Ambulatory Visit: Payer: Self-pay

## 2024-05-21 ENCOUNTER — Other Ambulatory Visit: Payer: Self-pay

## 2024-05-22 ENCOUNTER — Other Ambulatory Visit: Payer: Self-pay

## 2024-05-22 NOTE — Progress Notes (Signed)
 Opened in error

## 2024-05-24 ENCOUNTER — Other Ambulatory Visit: Payer: Self-pay

## 2024-05-27 ENCOUNTER — Other Ambulatory Visit: Payer: Self-pay

## 2024-05-30 ENCOUNTER — Other Ambulatory Visit: Payer: Self-pay

## 2024-06-03 ENCOUNTER — Other Ambulatory Visit: Payer: Self-pay

## 2024-06-03 DIAGNOSIS — E119 Type 2 diabetes mellitus without complications: Secondary | ICD-10-CM

## 2024-06-03 MED ORDER — OZEMPIC (0.25 OR 0.5 MG/DOSE) 2 MG/3ML ~~LOC~~ SOPN
0.5000 mg | PEN_INJECTOR | SUBCUTANEOUS | 3 refills | Status: AC
  Filled 2024-06-03: qty 3, 28d supply, fill #0

## 2024-06-03 NOTE — Progress Notes (Signed)
 [facility] pharmacy received PAP supply of Ozempic  0.5 mg weekly. Patient will continue to use up current supply of Trulicity  4.5 mg weekly, then switch to Ozempic  0.5 mg weekly. Appt scheduled for 06/18/24 to discuss.   Lorain Baseman, PharmD [facility] [phone]

## 2024-06-06 ENCOUNTER — Other Ambulatory Visit: Payer: Self-pay

## 2024-06-10 ENCOUNTER — Other Ambulatory Visit: Payer: Self-pay

## 2024-06-11 ENCOUNTER — Other Ambulatory Visit: Payer: Self-pay

## 2024-06-11 ENCOUNTER — Other Ambulatory Visit: Payer: Self-pay | Admitting: Nurse Practitioner

## 2024-06-11 MED ORDER — FLUTICASONE PROPIONATE 50 MCG/ACT NA SUSP
2.0000 | Freq: Every day | NASAL | 0 refills | Status: AC
  Filled 2024-06-11: qty 16, 30d supply, fill #0

## 2024-06-11 NOTE — Telephone Encounter (Signed)
 fluticasone  (FLONASE ) 50 MCG/ACT nasal spray

## 2024-06-12 ENCOUNTER — Other Ambulatory Visit: Payer: Self-pay

## 2024-06-14 ENCOUNTER — Other Ambulatory Visit: Payer: Self-pay

## 2024-06-14 ENCOUNTER — Telehealth (HOSPITAL_COMMUNITY): Admitting: Psychiatry

## 2024-06-14 ENCOUNTER — Encounter (HOSPITAL_COMMUNITY): Payer: Self-pay | Admitting: Psychiatry

## 2024-06-14 DIAGNOSIS — F3181 Bipolar II disorder: Secondary | ICD-10-CM

## 2024-06-14 MED ORDER — MELATONIN 5 MG PO TABS
5.0000 mg | ORAL_TABLET | Freq: Every evening | ORAL | 3 refills | Status: AC | PRN
  Filled 2024-06-14: qty 60, 60d supply, fill #0

## 2024-06-14 MED ORDER — LURASIDONE HCL 60 MG PO TABS
60.0000 mg | ORAL_TABLET | Freq: Every day | ORAL | 3 refills | Status: AC
  Filled 2024-06-14: qty 30, 30d supply, fill #0

## 2024-06-14 NOTE — Progress Notes (Signed)
 BH MD/PA/NP OP Progress Note Virtual Visit via Video Note  I connected with Christie Walker on 06/14/24 at  9:00 AM EST by a video enabled telemedicine application and verified that I am speaking with the correct person using two identifiers.  Location: Patient: Home Provider: Clinic   I discussed the limitations of evaluation and management by telemedicine and the availability of in person appointments. The patient expressed understanding and agreed to proceed.  I provided 30 minutes of non-face-to-face time during this encounter.        06/14/2024 8:47 AM Christie Walker  MRN:  [id number]  Chief Complaint: I am doing okay.   HPI:  54 year old female seen today for follow up psychiatric evaluation. She has a psychiatric history of bipolar disorder and and schizophrenia.  She is currently managed on Melatonin 5 mg nightly, and Latuda  60 mg daily.  She notes her medications are effective in managing her psychiatric conditions.   Today was well-groomed, pleasant, cooperative, and engaged in conversation.  She informed clinical research associate that she is doing okay.  She notes that the last 2 weekend she got stuck at work because of the snow.  Patient notes that she became somewhat irritable and had poor sleep but reports that now that she is home she is doing better.  Patient informed clinical research associate that she worked around [employer] for [employer].  She informed clinical research associate that her job has not complicated her for this but notes that she feels that she should be compensated.    Despite the above patient informed writer that she is able to cope.  She reports that her mood is stable and notes that she has minimal anxiety and depression.  Today provider conducted a GAD-7 and patient scored a 5, at her last visit she scored a 2.  Provider also conducted PHQ-9 patient scored a 5, at her last visit she scored a  0.  She endorses adequate sleep and appetite.  Today she denies SI/HI/AVH, mania, paranoia.      Overall  patient notes that she is doing well.  No medication changes made today.  Patient agreeable to continue medications as prescribed.  No other concerns noted at this time.     Visit Diagnosis:  No diagnosis found.        Past Psychiatric History:  bipolar disorder and and schizophrenia  Past Medical History:  Past Medical History:  Diagnosis Date   Acid reflux    Bipolar 1 disorder (HCC)    Essential hypertension 06/14/2016   No past surgical history on file.  Family Psychiatric History: Paternal aunts bipolar disorder and father has mental health conditions but unaware which one.   Family History:  Family History  Problem Relation Age of Onset   Mental illness Other     Social History:  Social History   Socioeconomic History   Marital status: Legally Separated    Spouse name: Not on file   Number of children: Not on file   Years of education: Not on file   Highest education level: Not on file  Occupational History   Not on file  Tobacco Use   Smoking status: Never   Smokeless tobacco: Never  Vaping Use   Vaping status: Never Used  Substance and Sexual Activity   Alcohol use: No   Drug use: No   Sexual activity: Not Currently    Birth control/protection: None  Other Topics Concern   Not on file  Social History Narrative   Not on  file   Social Drivers of Health   Tobacco Use: Low Risk (04/24/2024)   Patient History    Smoking Tobacco Use: Never    Smokeless Tobacco Use: Never    Passive Exposure: Not on file  Financial Resource Strain: Not on file  Food Insecurity: No Food Insecurity (10/05/2023)   Hunger Vital Sign    Worried About Running Out of Food in the Last Year: Never true    Ran Out of Food in the Last Year: Never true  Transportation Needs: No Transportation Needs (10/05/2023)   PRAPARE - Administrator, Civil Service (Medical): No    Lack of Transportation (Non-Medical): No  Physical Activity: Not on file  Stress: Not on file   Social Connections: Not on file  Depression (PHQ2-9): Low Risk (03/22/2024)   Depression (PHQ2-9)    PHQ-2 Score: 0  Alcohol Screen: Not on file  Housing: Unknown (10/05/2023)   Housing Stability Vital Sign    Unable to Pay for Housing in the Last Year: No    Number of Times Moved in the Last Year: Not on file    Homeless in the Last Year: No  Utilities: Not on file  Health Literacy: Not on file    Allergies:  Allergies  Allergen Reactions   Bee Venom Swelling    Swelling at site    Amoxicillin Hives and Other (See Comments)    Patient states that she can take this she just has a sensitivity when its taken along with pain medication   Hydrocodone  Hives and Other (See Comments)    Sensitivity when taken along with antibiotics like amoxicillin per patient   Insect Extract Other (See Comments)    Spiders - make tongue burn   Propoxyphene N-Acetaminophen  Nausea And Vomiting and Rash    ** Darvacet    Metabolic Disorder Labs: Lab Results  Component Value Date   HGBA1C 6.0 (A) 04/24/2024   MPG 139.85 08/06/2019   MPG 128.37 08/01/2019   No results found for: PROLACTIN Lab Results  Component Value Date   CHOL 145 10/05/2023   TRIG 131 10/05/2023   HDL 42 10/05/2023   CHOLHDL 3.5 10/05/2023   VLDL 32 (H) 06/14/2016   LDLCALC 80 10/05/2023   LDLCALC 139 (H) 12/09/2022   Lab Results  Component Value Date   TSH 2.660 12/09/2022   TSH 1.080 10/04/2019    Therapeutic Level Labs: No results found for: LITHIUM No results found for: VALPROATE No results found for: CBMZ  Current Medications: Current Outpatient Medications  Medication Sig Dispense Refill   acetaminophen  (TYLENOL ) 325 MG tablet Take 650 mg by mouth every 6 (six) hours as needed for mild pain or headache.     atorvastatin  (LIPITOR) 40 MG tablet Take 1 tablet (40 mg total) by mouth daily. 90 tablet 3   Cholecalciferol (VITAMIN D3) 125 MCG (5000 UT) CAPS Take 1 capsule by mouth daily.      fluticasone  (FLONASE ) 50 MCG/ACT nasal spray Place 2 sprays into both nostrils daily. 16 g 0   furosemide  (LASIX ) 20 MG tablet Take 1 tablet (20 mg total) by mouth daily. 90 tablet 3   ibuprofen  (ADVIL ) 800 MG tablet Take 1 tablet (800 mg total) by mouth every 8 (eight) hours as needed. 30 tablet 2   IRON PO Take 1 tablet by mouth daily.     Lurasidone  HCl (LATUDA ) 60 MG TABS Take 1 tablet (60 mg total) by mouth daily. 30 tablet 3   melatonin  5 MG TABS Take 1 tablet (5 mg total) by mouth at bedtime as needed. 60 tablet 3   Semaglutide ,0.25 or 0.5MG /DOS, (OZEMPIC , 0.25 OR 0.5 MG/DOSE,) 2 MG/3ML SOPN Inject 0.5 mg into the skin once a week. 3 mL 3   traMADol  (ULTRAM ) 50 MG tablet Take 1 tablet (50 mg total) by mouth every 12 (twelve) hours as needed. 30 tablet 2   No current facility-administered medications for this visit.     Musculoskeletal: Strength & Muscle Tone: within normal limits and telehealth visit Gait & Station: normal, Telehealth visit Patient leans: N/A  Psychiatric Specialty Exam: Review of Systems  There were no vitals taken for this visit.There is no height or weight on file to calculate BMI.  General Appearance: Well Groomed  Eye Contact:  Good  Speech:  Clear and Coherent and Normal Rate  Volume:  Normal  Mood:  Euthymic  Affect:  Appropriate and Congruent  Thought Process:  Coherent, Goal Directed and Linear  Orientation:  Full (Time, Place, and Person)  Thought Content: WDL and Logical   Suicidal Thoughts:  No  Homicidal Thoughts:  No  Memory:  Immediate;   Good Recent;   Good Remote;   Good  Judgement:  Good  Insight:  Good  Psychomotor Activity:  Normal  Concentration:  Concentration: Good and Attention Span: Good  Recall:  Good  Fund of Knowledge: Good  Language: Good  Akathisia:  No  Handed:  Right  AIMS (if indicated): not done  Assets:  Communication Skills Desire for Improvement Financial Resources/Insurance Housing Social Support  ADL's:   Intact  Cognition: WNL  Sleep:  Good   Screenings: AIMS    Flowsheet Row Video Visit from [facility]14/2023 in [facility]  AIMS Total Score 0   GAD-7    Flowsheet Row Video Visit from 03/22/2024 in [facility] Video Visit from 12/14/2023 in [facility] Video Visit from 09/21/2023 in [facility] Video Visit from 06/29/2023 in [facility] Clinical Support from 04/20/2023 in [facility]  Total GAD-7 Score 2 1 1 2 3    PHQ2-9    Flowsheet Row Video Visit from 03/22/2024 in [facility] Video Visit from 12/14/2023 in [facility] Video Visit from 09/21/2023 in [facility] Video Visit from 06/29/2023 in [facility] Clinical Support from 04/20/2023 in [facility]  PHQ-2 Total Score 0 0 0 0 0  PHQ-9 Total Score 0 -- 3 2 1    Flowsheet Row Video Visit from 12/14/2023 in [facility] ED from 04/26/2022 in [facility] Emergency Department at [facility] ED from 11/24/2021 in [facility] Emergency Department at [facility]  C-SSRS RISK CATEGORY Moderate Risk No Risk No Risk     Assessment and Plan: Patient informed writer that she is doing well on her current medication regimen.  No medication changes made today.  Patient agreeable to continue medications as prescribed.     1. Bipolar 2 disorder (HCC)  Continue- Lurasidone  HCl (LATUDA ) 60 MG TABS; Take 1 tablet (60 mg total) by mouth daily.  Dispense: 30 tablet; Refill: 3 Continue- melatonin 5 MG TABS; Take 1 tablet (5 mg total) by mouth at bedtime as needed.  Dispense: 60 tablet; Refill: 3   Follow-up in 2 months  Christie FORBES Bach, NP  06/14/2024, 8:47 AM

## 2024-06-18 ENCOUNTER — Other Ambulatory Visit: Payer: Self-pay

## 2024-07-24 ENCOUNTER — Ambulatory Visit: Payer: Self-pay | Admitting: Nurse Practitioner

## 2024-08-09 ENCOUNTER — Telehealth (HOSPITAL_COMMUNITY): Admitting: Psychiatry
